# Patient Record
Sex: Female | Born: 1945 | Race: White | Hispanic: No | Marital: Married | Smoking: Former smoker
Health system: Southern US, Community
[De-identification: ages and names within clinical notes are randomized; demographics above are authoritative.]

## PROBLEM LIST (undated history)

## (undated) DIAGNOSIS — E11319 Type 2 diabetes mellitus with unspecified diabetic retinopathy without macular edema: Secondary | ICD-10-CM

## (undated) DIAGNOSIS — H35039 Hypertensive retinopathy, unspecified eye: Secondary | ICD-10-CM

## (undated) DIAGNOSIS — I10 Essential (primary) hypertension: Secondary | ICD-10-CM

## (undated) DIAGNOSIS — R7989 Other specified abnormal findings of blood chemistry: Secondary | ICD-10-CM

## (undated) DIAGNOSIS — E119 Type 2 diabetes mellitus without complications: Secondary | ICD-10-CM

## (undated) DIAGNOSIS — I4891 Unspecified atrial fibrillation: Secondary | ICD-10-CM

## (undated) DIAGNOSIS — H269 Unspecified cataract: Secondary | ICD-10-CM

## (undated) DIAGNOSIS — N289 Disorder of kidney and ureter, unspecified: Secondary | ICD-10-CM

## (undated) DIAGNOSIS — Z7739 Contact with and (suspected) exposure to other war theater: Secondary | ICD-10-CM

## (undated) DIAGNOSIS — E039 Hypothyroidism, unspecified: Secondary | ICD-10-CM

## (undated) DIAGNOSIS — E079 Disorder of thyroid, unspecified: Secondary | ICD-10-CM

## (undated) DIAGNOSIS — Z77098 Contact with and (suspected) exposure to other hazardous, chiefly nonmedicinal, chemicals: Secondary | ICD-10-CM

## (undated) HISTORY — DX: Type 2 diabetes mellitus with unspecified diabetic retinopathy without macular edema: E11.319

## (undated) HISTORY — DX: Unspecified atrial fibrillation: I48.91

## (undated) HISTORY — DX: Type 2 diabetes mellitus without complications: E11.9

## (undated) HISTORY — DX: Other specified abnormal findings of blood chemistry: R79.89

## (undated) HISTORY — DX: Unspecified cataract: H26.9

## (undated) HISTORY — DX: Contact with and (suspected) exposure to other hazardous, chiefly nonmedicinal, chemicals: Z77.098

## (undated) HISTORY — DX: Contact with and (suspected) exposure to other war theater: Z77.39

## (undated) HISTORY — DX: Hypertensive retinopathy, unspecified eye: H35.039

## (undated) HISTORY — PX: TOE AMPUTATION: SHX809

## (undated) HISTORY — PX: HERNIA REPAIR: SHX51

## (undated) HISTORY — PX: APPENDECTOMY: SHX54

## (undated) HISTORY — DX: Essential (primary) hypertension: I10

## (undated) HISTORY — DX: Disorder of kidney and ureter, unspecified: N28.9

## (undated) HISTORY — PX: CHOLECYSTECTOMY: SHX55

---

## 2020-09-29 ENCOUNTER — Ambulatory Visit: Payer: Self-pay | Admitting: Family Medicine

## 2020-10-08 ENCOUNTER — Encounter: Payer: Self-pay | Admitting: Family Medicine

## 2020-10-08 ENCOUNTER — Ambulatory Visit (INDEPENDENT_AMBULATORY_CARE_PROVIDER_SITE_OTHER): Payer: Medicare HMO | Admitting: Family Medicine

## 2020-10-08 ENCOUNTER — Other Ambulatory Visit: Payer: Self-pay

## 2020-10-08 VITALS — BP 161/79 | HR 71 | Temp 97.7°F | Ht 64.5 in | Wt 153.3 lb

## 2020-10-08 DIAGNOSIS — I480 Paroxysmal atrial fibrillation: Secondary | ICD-10-CM

## 2020-10-08 DIAGNOSIS — I739 Peripheral vascular disease, unspecified: Secondary | ICD-10-CM | POA: Diagnosis not present

## 2020-10-08 DIAGNOSIS — Z77098 Contact with and (suspected) exposure to other hazardous, chiefly nonmedicinal, chemicals: Secondary | ICD-10-CM | POA: Diagnosis not present

## 2020-10-08 DIAGNOSIS — R350 Frequency of micturition: Secondary | ICD-10-CM

## 2020-10-08 DIAGNOSIS — Z89429 Acquired absence of other toe(s), unspecified side: Secondary | ICD-10-CM | POA: Diagnosis not present

## 2020-10-08 DIAGNOSIS — E039 Hypothyroidism, unspecified: Secondary | ICD-10-CM | POA: Insufficient documentation

## 2020-10-08 DIAGNOSIS — E1152 Type 2 diabetes mellitus with diabetic peripheral angiopathy with gangrene: Secondary | ICD-10-CM

## 2020-10-08 NOTE — Assessment & Plan Note (Signed)
Related to T2DM and PAD.  Referral made to establish with podiatrist int his area.

## 2020-10-08 NOTE — Patient Instructions (Signed)
Great to meet you today! Have labs completed.  I have ordered a referral to podiatry.  You will receive a call to set up an appointment.  See me again in 3 months.

## 2020-10-08 NOTE — Assessment & Plan Note (Signed)
UA and culture

## 2020-10-08 NOTE — Assessment & Plan Note (Signed)
She avoids radiological procedures unless absolutely necessary due to concern for increased cancer risk related to this.  Her DM, vascular diseas and thyroid disease may be related to this.

## 2020-10-08 NOTE — Assessment & Plan Note (Addendum)
Reports good control with metformin.  Update a1c and renal function today.  Continue lisinopril for renal protection.

## 2020-10-08 NOTE — Assessment & Plan Note (Signed)
S/p angioplasty, unsure which artery.  Outside records requested.  No  Claudication symptoms.  Continue statin and plavix.

## 2020-10-08 NOTE — Assessment & Plan Note (Signed)
Feels well with current dose of armour thyroid.  Update TFT's

## 2020-10-08 NOTE — Assessment & Plan Note (Signed)
Had singular episode after angioplasty for PAD.  She is on metoprolol.  No symptoms.

## 2020-10-08 NOTE — Progress Notes (Signed)
Erin Mitchell - 75 y.o. female MRN [id number]  Date of birth: Feb 07, 1946  Subjective Chief Complaint  Patient presents with  . Establish Care  . mucus in stool    HPI Erin Mitchell is a 75 y.o. female here today for initial visit to establish care.  Recently moved to the area from [place].  She has a history of HTN, T2DM, Hypothyroidism, and PAD.  History of agent orange exposure during [place] War while she was working with the [employer]. Some of her ailments thought to be related to this exposure.    She reports that diabetes has been pretty well controlled.  Currently treated with metformin.  She does have associated PAD as well. She has had angioplasty of artery of leg.  She has had a couple of rays amputated from her R foot.  She denies significant neuropathy.  She is taking plavix daily.  Tolerating crestor for associated HLD as well.   HTN is managed with lisinopril and metoprolol.  She reports that she has done well with these and BP at home has been well controlled.  She did have episode of a. Fib after angioplasty but has not had anything since. Denies side effects related to medications.  She has not had chest pain, shortness of breath, palpitations, headache or vision changes.    Currently on 90mg  of armour thyroid.  Feels good at current dose.   Took synthroid previously, feels better with this.   ROS:  A comprehensive ROS was completed and negative except as noted per HPI    No Known Allergies  Past Medical History:  Diagnosis Date  . Atrial fibrillation (HCC)   . Diabetes (HCC)   . Exposure to   . Hypertension   . Kidney disease   . Low thyroid stimulating hormone (TSH) level     Past Surgical History:  Procedure Laterality Date  . APPENDECTOMY    . CESAREAN SECTION    . CHOLECYSTECTOMY    . HERNIA REPAIR    . TOE AMPUTATION Right    2nd & 3rd    Social History   Socioeconomic History  . Marital status: Married    Spouse name: Not on  file  . Number of children: Not on file  . Years of education: Not on file  . Highest education level: Not on file  Occupational History  . Occupation: Retired  Tobacco Use  . Smoking status: Former Smoker    Packs/day: 0.25    Years: 1.00    Pack years: 0.25    Types: Cigarettes    Quit date: 06/06/1970    Years since quitting: 50.3  . Smokeless tobacco: Never Used  Vaping Use  . Vaping Use: Never used  Substance and Sexual Activity  . Alcohol use: Never  . Drug use: Never  . Sexual activity: Yes    Partners: Male  Other Topics Concern  . Not on file  Social History Narrative  . Not on file   Social Determinants of Health   Financial Resource Strain: Not on file  Food Insecurity: Not on file  Transportation Needs: Not on file  Physical Activity: Not on file  Stress: Not on file  Social Connections: Not on file    Family History  Problem Relation Age of Onset  . Hypertension Father   . Diabetes Brother     Health Maintenance  Topic Date Due  . Hepatitis C Screening  Never done  . PNA vac Low Risk Adult (  2 of 2 - PPSV23) 01/22/2020  . COVID-19 Vaccine (3 - Booster for Pfizer series) 01/28/2020  . INFLUENZA VACCINE  01/04/2021  . TETANUS/TDAP  08/15/2029  . HPV VACCINES  Aged Out  . MAMMOGRAM  Discontinued  . DEXA SCAN  Discontinued  . COLONOSCOPY (Pts 45-2yrs Insurance coverage will need to be confirmed)  Discontinued     ----------------------------------------------------------------------------------------------------------------------------------------------------------------------------------------------------------------- Physical Exam BP (!) 161/79 (BP Location: Left Arm, Patient Position: Sitting, Cuff Size: Normal)   Pulse 71   Temp 97.7 F (36.5 C)   Ht 5' 4.5" (1.638 m)   Wt 153 lb 4.8 oz (69.5 kg)   SpO2 99%   BMI 25.91 kg/m   Physical Exam Constitutional:      Appearance: Normal appearance.  HENT:     Head: Normocephalic and  atraumatic.  Eyes:     General: No scleral icterus. Cardiovascular:     Rate and Rhythm: Normal rate and regular rhythm.  Pulmonary:     Effort: Pulmonary effort is normal.     Breath sounds: Normal breath sounds.  Musculoskeletal:     Cervical back: Neck supple.  Neurological:     General: No focal deficit present.     Mental Status: She is alert.  Psychiatric:        Mood and Affect: Mood normal.        Behavior: Behavior normal.     ------------------------------------------------------------------------------------------------------------------------------------------------------------------------------------------------------------------- Assessment and Plan  Type 2 diabetes mellitus with diabetic peripheral angiopathy and gangrene, without long-term current use of insulin (HCC) Reports good control with metformin.  Update a1c and renal function today.  Continue lisinopril for renal protection.    PAF (paroxysmal atrial fibrillation) (HCC) Had singular episode after angioplasty for PAD.  She is on metoprolol.  No symptoms.   PAD (peripheral artery disease) (HCC) S/p angioplasty, unsure which artery.  Outside records requested.  No  Claudication symptoms.  Continue statin and plavix.   Acquired hypothyroidism Feels well with current dose of armour thyroid.  Update TFT's  History of amputation of toe (HCC) Related to T2DM and PAD.  Referral made to establish with podiatrist int his area.    Urinary frequency UA and culture.   Agent orange exposure She avoids radiological procedures unless absolutely necessary due to concern for increased cancer risk related to this.  Her DM, vascular diseas and thyroid disease may be related to this.     No orders of the defined types were placed in this encounter.   Return in about 3 months (around 01/08/2021) for HTN/T2DM.    This visit occurred during the SARS-CoV-2 public health emergency.  Safety protocols were in place,  including screening questions prior to the visit, additional usage of staff PPE, and extensive cleaning of exam room while observing appropriate contact time as indicated for disinfecting solutions.

## 2020-10-09 ENCOUNTER — Other Ambulatory Visit: Payer: Self-pay | Admitting: Family Medicine

## 2020-10-09 ENCOUNTER — Telehealth: Payer: Self-pay

## 2020-10-09 DIAGNOSIS — E11319 Type 2 diabetes mellitus with unspecified diabetic retinopathy without macular edema: Secondary | ICD-10-CM

## 2020-10-09 LAB — LIPID PANEL W/REFLEX DIRECT LDL
Cholesterol: 126 mg/dL (ref <200)
HDL: 49 mg/dL — ABNORMAL LOW (ref >=50)
LDL Cholesterol (Calc): 56 mg/dL (calc)
Non-HDL Cholesterol (Calc): 77 mg/dL (calc) (ref <130)
Total CHOL/HDL Ratio: 2.6 (calc) (ref <5.0)
Triglycerides: 125 mg/dL (ref <150)

## 2020-10-09 LAB — URINALYSIS, ROUTINE W REFLEX MICROSCOPIC
Bilirubin Urine: NEGATIVE
Glucose, UA: NEGATIVE
Hgb urine dipstick: NEGATIVE
Hyaline Cast: NONE SEEN /LPF
Ketones, ur: NEGATIVE
Nitrite: NEGATIVE
Specific Gravity, Urine: 1.019 (ref 1.001–1.035)
pH: 6.5 (ref 5.0–8.0)

## 2020-10-09 LAB — CBC WITH DIFFERENTIAL/PLATELET
Absolute Monocytes: 893 cells/uL (ref 200–950)
Basophils Absolute: 60 cells/uL (ref 0–200)
Basophils Relative: 0.7 %
Eosinophils Absolute: 434 cells/uL (ref 15–500)
Eosinophils Relative: 5.1 %
HCT: 38.9 % (ref 35.0–45.0)
Hemoglobin: 12.6 g/dL (ref 11.7–15.5)
Lymphs Abs: 2270 cells/uL (ref 850–3900)
MCH: 28.5 pg (ref 27.0–33.0)
MCHC: 32.4 g/dL (ref 32.0–36.0)
MCV: 88 fL (ref 80.0–100.0)
MPV: 10.4 fL (ref 7.5–12.5)
Monocytes Relative: 10.5 %
Neutro Abs: 4845 cells/uL (ref 1500–7800)
Neutrophils Relative %: 57 %
Platelets: 260 10*3/uL (ref 140–400)
RBC: 4.42 10*6/uL (ref 3.80–5.10)
RDW: 12.4 % (ref 11.0–15.0)
Total Lymphocyte: 26.7 %
WBC: 8.5 10*3/uL (ref 3.8–10.8)

## 2020-10-09 LAB — COMPLETE METABOLIC PANEL WITH GFR
AG Ratio: 1.7 (calc) (ref 1.0–2.5)
ALT: 34 U/L — ABNORMAL HIGH (ref 6–29)
AST: 38 U/L — ABNORMAL HIGH (ref 10–35)
Albumin: 4.3 g/dL (ref 3.6–5.1)
Alkaline phosphatase (APISO): 86 U/L (ref 37–153)
BUN: 23 mg/dL (ref 7–25)
CO2: 25 mmol/L (ref 20–32)
Calcium: 9.4 mg/dL (ref 8.6–10.4)
Chloride: 104 mmol/L (ref 98–110)
Creat: 0.8 mg/dL (ref 0.60–0.93)
GFR, Est African American: 85 mL/min/{1.73_m2} (ref >=60)
GFR, Est Non African American: 74 mL/min/{1.73_m2} (ref >=60)
Globulin: 2.5 g/dL (calc) (ref 1.9–3.7)
Glucose, Bld: 131 mg/dL — ABNORMAL HIGH (ref 65–99)
Potassium: 4.4 mmol/L (ref 3.5–5.3)
Sodium: 138 mmol/L (ref 135–146)
Total Bilirubin: 0.4 mg/dL (ref 0.2–1.2)
Total Protein: 6.8 g/dL (ref 6.1–8.1)

## 2020-10-09 LAB — URINE CULTURE
MICRO NUMBER:: 11854412
SPECIMEN QUALITY:: ADEQUATE

## 2020-10-09 LAB — TSH: TSH: 2.65 mIU/L (ref 0.40–4.50)

## 2020-10-09 LAB — HEMOGLOBIN A1C
Hgb A1c MFr Bld: 6.8 % of total Hgb — ABNORMAL HIGH (ref <5.7)
Mean Plasma Glucose: 148 mg/dL
eAG (mmol/L): 8.2 mmol/L

## 2020-10-09 NOTE — Progress Notes (Signed)
r 

## 2020-10-09 NOTE — Telephone Encounter (Signed)
Erin Mitchell has diabetic retinopathy and would like a referral to:   [facility] [address] [place], [place]

## 2020-10-09 NOTE — Telephone Encounter (Signed)
Referral entered.   CM

## 2020-10-12 ENCOUNTER — Other Ambulatory Visit: Payer: Self-pay

## 2020-10-12 DIAGNOSIS — I739 Peripheral vascular disease, unspecified: Secondary | ICD-10-CM

## 2020-10-12 DIAGNOSIS — E1152 Type 2 diabetes mellitus with diabetic peripheral angiopathy with gangrene: Secondary | ICD-10-CM

## 2020-10-12 DIAGNOSIS — I480 Paroxysmal atrial fibrillation: Secondary | ICD-10-CM

## 2020-10-12 MED ORDER — TRUEPLUS LANCETS 33G MISC
3 refills | Status: AC
Start: 2020-10-12 — End: ?

## 2020-10-12 MED ORDER — LISINOPRIL 20 MG PO TABS: ORAL_TABLET | ORAL | 3 refills | Status: DC

## 2020-10-12 MED ORDER — TRUE METRIX METER W/DEVICE KIT: PACK | 0 refills | Status: AC

## 2020-10-12 MED ORDER — METFORMIN HCL 500 MG PO TABS: ORAL_TABLET | ORAL | 3 refills | Status: DC

## 2020-10-12 MED ORDER — ROSUVASTATIN CALCIUM 20 MG PO TABS: ORAL_TABLET | ORAL | 3 refills | Status: DC

## 2020-10-12 MED ORDER — BD SWAB SINGLE USE REGULAR PADS: MEDICATED_PAD | 3 refills | Status: DC

## 2020-10-12 MED ORDER — TRUE METRIX BLOOD GLUCOSE TEST VI STRP: ORAL_STRIP | 12 refills | Status: DC

## 2020-10-12 MED ORDER — CLOPIDOGREL BISULFATE 75 MG PO TABS: ORAL_TABLET | ORAL | 3 refills | Status: DC

## 2020-10-12 MED ORDER — METOPROLOL TARTRATE 25 MG PO TABS: ORAL_TABLET | ORAL | 3 refills | Status: DC

## 2020-10-13 ENCOUNTER — Ambulatory Visit: Payer: Medicare HMO | Admitting: Podiatry

## 2020-10-16 ENCOUNTER — Other Ambulatory Visit: Payer: Self-pay

## 2020-10-16 ENCOUNTER — Encounter: Payer: Self-pay | Admitting: Podiatry

## 2020-10-16 ENCOUNTER — Ambulatory Visit (INDEPENDENT_AMBULATORY_CARE_PROVIDER_SITE_OTHER): Payer: Medicare HMO

## 2020-10-16 ENCOUNTER — Ambulatory Visit: Payer: Medicare HMO | Admitting: Podiatry

## 2020-10-16 DIAGNOSIS — Z89429 Acquired absence of other toe(s), unspecified side: Secondary | ICD-10-CM

## 2020-10-16 DIAGNOSIS — M21611 Bunion of right foot: Secondary | ICD-10-CM

## 2020-10-16 DIAGNOSIS — Z7901 Long term (current) use of anticoagulants: Secondary | ICD-10-CM | POA: Diagnosis not present

## 2020-10-16 DIAGNOSIS — S93141A Subluxation of metatarsophalangeal joint of right great toe, initial encounter: Secondary | ICD-10-CM | POA: Diagnosis not present

## 2020-10-16 DIAGNOSIS — B351 Tinea unguium: Secondary | ICD-10-CM | POA: Diagnosis not present

## 2020-10-16 DIAGNOSIS — E1152 Type 2 diabetes mellitus with diabetic peripheral angiopathy with gangrene: Secondary | ICD-10-CM

## 2020-10-16 DIAGNOSIS — M79674 Pain in right toe(s): Secondary | ICD-10-CM | POA: Diagnosis not present

## 2020-10-16 DIAGNOSIS — I739 Peripheral vascular disease, unspecified: Secondary | ICD-10-CM | POA: Diagnosis not present

## 2020-10-16 DIAGNOSIS — M2011 Hallux valgus (acquired), right foot: Secondary | ICD-10-CM | POA: Diagnosis not present

## 2020-10-16 DIAGNOSIS — M79675 Pain in left toe(s): Secondary | ICD-10-CM

## 2020-10-16 MED ORDER — METFORMIN HCL 500 MG PO TABS: 500.0000 mg | ORAL_TABLET | Freq: Two times a day (BID) | ORAL | 3 refills | Status: DC

## 2020-10-16 NOTE — Progress Notes (Signed)
Subjective:   Patient ID: Erin Mitchell, female   DOB: 75 y.o.   MRN: [id number]   HPI Erin Mitchell is a 75 y.o. female here today for initial visit to establish care. Recently moved to the area from [place].  She has a history of HTN, T2DM, Hypothyroidism, and PAD.  She has history of partial second and third ray amputations as well as vascular procedures given osteomyelitis, infection of the right foot.  She states that she has been stable and she wears compression socks that are open toe.  She is tried diabetic shoes but they were causing irritation states she wears soft sandals which are comfortable not causing any irritation.  Also asking for the nails be trimmed today.  She has not seen any open lesions.  She states that her other doctor told her that the remainder the toes on the right foot will need to be amputated at some point but she is not interested in this.   Per Care Everywhere: 12/23/2019 Amputation of Toe Information not available  12/20/2019 Abdominal Aortogram Notes:  BALLOON ANGIO Information not available  12/16/2019 Amputation of Toe Information not available    Review of Systems  All other systems reviewed and are negative.  Past Medical History:  Diagnosis Date  . Atrial fibrillation ([place])   . Diabetes ([place])   . Exposure to [employer]   . Hypertension   . Kidney disease   . Low thyroid stimulating hormone (TSH) level     Past Surgical History:  Procedure Laterality Date  . APPENDECTOMY    . CESAREAN SECTION    . CHOLECYSTECTOMY    . HERNIA REPAIR    . TOE AMPUTATION Right    2nd & 3rd     Current Outpatient Medications:  .  Alcohol Swabs (B-D SINGLE USE SWABS REGULAR) PADS, Use as directed., Disp: 100 each, Rfl: 3 .  Blood Glucose Monitoring Suppl (TRUE METRIX METER) w/Device KIT, Use as directed to test blood glucose daily, Disp: 1 kit, Rfl: 0 .  clopidogrel (PLAVIX) 75 MG tablet, TAKE 1 TABLET BY MOUTH ONCE DAILY, Disp: 90 tablet, Rfl: 3 .   glucose blood (TRUE METRIX BLOOD GLUCOSE TEST) test strip, Use as instructed, Disp: 100 each, Rfl: 12 .  lisinopril (ZESTRIL) 20 MG tablet, TAKE 1 TABLET BY MOUTH TWICE DAILY, Disp: 90 tablet, Rfl: 3 .  metFORMIN (GLUCOPHAGE) 500 MG tablet, metformin 500 mg tablet, Disp: 90 tablet, Rfl: 3 .  metoprolol tartrate (LOPRESSOR) 25 MG tablet, TAKE 1 TABLET BY MOUTH TWICE DAILY WITH FOOD, Disp: 90 tablet, Rfl: 3 .  rosuvastatin (CRESTOR) 20 MG tablet, TAKE 1 TABLET BY MOUTH ONCE DAILY, Disp: 90 tablet, Rfl: 3 .  thyroid (ARMOUR) 90 MG tablet, NP Thyroid 90 mg tablet  TAKE 1 TABLET BY MOUTH EVERY DAY, Disp: , Rfl:  .  TRUEplus Lancets 33G MISC, Use as directed daily., Disp: 100 each, Rfl: 3  No Known Allergies       Objective:  Physical Exam  General: AAO x3, NAD  Dermatological: Nails are hypertrophic, dystrophic, brittle, discolored, elongated 8. No surrounding redness or drainage. Tenderness nails 1-5 bilaterally except of the second and third toes which been amputated on the right side. No open lesions or pre-ulcerative lesions are identified today.   Vascular: Dorsalis Pedis artery and Posterior Tibial artery pedal pulses are 1/4 bilateral with immedate capillary fill time. There is no pain with calf compression, swelling, warmth, erythema.   Neruologic: Sensation decreased with Thornell Mule  monofilament  Musculoskeletal: Severe bunion is present on the right foot and the fourth and first toe on the right foot are abutting.   Gait: Unassisted, Nonantalgic.       Assessment:   75 year old female with symptomatic onychomycosis, PAD with type 2 diabetes with history of to amputation    Plan:  -Treatment options discussed including all alternatives, risks, and complications -Etiology of symptoms were discussed -Nails debrided 8 without complications or bleeding. -X-rays of the right foot obtained for baseline studies. -Currently without ulcerations.  Continue with compression  socks as well as current shoe gear. -Daily foot inspection -Follow-up in 3 months or sooner if any problems arise. In the meantime, encouraged to call the office with any questions, concerns, change in symptoms.   Celesta Gentile, DPM

## 2020-10-20 ENCOUNTER — Ambulatory Visit: Payer: Medicare HMO | Admitting: Podiatry

## 2020-11-03 ENCOUNTER — Other Ambulatory Visit: Payer: Self-pay

## 2020-11-03 ENCOUNTER — Emergency Department
Admission: EM | Admit: 2020-11-03 | Discharge: 2020-11-03 | Disposition: A | Discharge status: Home / Self Care | Payer: Medicare HMO | Source: Home / Self Care

## 2020-11-03 ENCOUNTER — Inpatient Hospital Stay (HOSPITAL_COMMUNITY): Payer: Medicare HMO

## 2020-11-03 ENCOUNTER — Encounter (HOSPITAL_COMMUNITY): Payer: Self-pay | Admitting: Emergency Medicine

## 2020-11-03 ENCOUNTER — Emergency Department (INDEPENDENT_AMBULATORY_CARE_PROVIDER_SITE_OTHER): Payer: Medicare HMO

## 2020-11-03 ENCOUNTER — Inpatient Hospital Stay (HOSPITAL_COMMUNITY)
Admission: EM | Admit: 2020-11-03 | Discharge: 2020-11-09 | DRG: 394 | Disposition: A | Discharge status: Home / Self Care | Payer: Medicare HMO | Attending: Internal Medicine | Admitting: Internal Medicine

## 2020-11-03 DIAGNOSIS — N3000 Acute cystitis without hematuria: Secondary | ICD-10-CM | POA: Diagnosis not present

## 2020-11-03 DIAGNOSIS — R110 Nausea: Secondary | ICD-10-CM | POA: Diagnosis not present

## 2020-11-03 DIAGNOSIS — Z87891 Personal history of nicotine dependence: Secondary | ICD-10-CM | POA: Diagnosis not present

## 2020-11-03 DIAGNOSIS — I480 Paroxysmal atrial fibrillation: Secondary | ICD-10-CM | POA: Diagnosis not present

## 2020-11-03 DIAGNOSIS — Z7984 Long term (current) use of oral hypoglycemic drugs: Secondary | ICD-10-CM

## 2020-11-03 DIAGNOSIS — Z825 Family history of asthma and other chronic lower respiratory diseases: Secondary | ICD-10-CM

## 2020-11-03 DIAGNOSIS — E1151 Type 2 diabetes mellitus with diabetic peripheral angiopathy without gangrene: Secondary | ICD-10-CM | POA: Diagnosis not present

## 2020-11-03 DIAGNOSIS — Z66 Do not resuscitate: Secondary | ICD-10-CM | POA: Diagnosis not present

## 2020-11-03 DIAGNOSIS — R935 Abnormal findings on diagnostic imaging of other abdominal regions, including retroperitoneum: Secondary | ICD-10-CM | POA: Diagnosis not present

## 2020-11-03 DIAGNOSIS — Z8249 Family history of ischemic heart disease and other diseases of the circulatory system: Secondary | ICD-10-CM | POA: Diagnosis not present

## 2020-11-03 DIAGNOSIS — R188 Other ascites: Secondary | ICD-10-CM | POA: Diagnosis not present

## 2020-11-03 DIAGNOSIS — Z452 Encounter for adjustment and management of vascular access device: Secondary | ICD-10-CM | POA: Diagnosis not present

## 2020-11-03 DIAGNOSIS — Z20822 Contact with and (suspected) exposure to covid-19: Secondary | ICD-10-CM | POA: Diagnosis present

## 2020-11-03 DIAGNOSIS — Z7189 Other specified counseling: Secondary | ICD-10-CM | POA: Diagnosis not present

## 2020-11-03 DIAGNOSIS — K529 Noninfective gastroenteritis and colitis, unspecified: Secondary | ICD-10-CM | POA: Diagnosis present

## 2020-11-03 DIAGNOSIS — R1084 Generalized abdominal pain: Secondary | ICD-10-CM

## 2020-11-03 DIAGNOSIS — K921 Melena: Secondary | ICD-10-CM | POA: Diagnosis not present

## 2020-11-03 DIAGNOSIS — K430 Incisional hernia with obstruction, without gangrene: Secondary | ICD-10-CM | POA: Diagnosis not present

## 2020-11-03 DIAGNOSIS — Z79890 Hormone replacement therapy (postmenopausal): Secondary | ICD-10-CM

## 2020-11-03 DIAGNOSIS — R109 Unspecified abdominal pain: Secondary | ICD-10-CM | POA: Diagnosis present

## 2020-11-03 DIAGNOSIS — E1129 Type 2 diabetes mellitus with other diabetic kidney complication: Secondary | ICD-10-CM | POA: Diagnosis present

## 2020-11-03 DIAGNOSIS — Z7902 Long term (current) use of antithrombotics/antiplatelets: Secondary | ICD-10-CM

## 2020-11-03 DIAGNOSIS — K5730 Diverticulosis of large intestine without perforation or abscess without bleeding: Secondary | ICD-10-CM | POA: Diagnosis present

## 2020-11-03 DIAGNOSIS — E860 Dehydration: Secondary | ICD-10-CM | POA: Diagnosis present

## 2020-11-03 DIAGNOSIS — I10 Essential (primary) hypertension: Secondary | ICD-10-CM | POA: Diagnosis not present

## 2020-11-03 DIAGNOSIS — I493 Ventricular premature depolarization: Secondary | ICD-10-CM | POA: Diagnosis present

## 2020-11-03 DIAGNOSIS — E876 Hypokalemia: Secondary | ICD-10-CM | POA: Diagnosis not present

## 2020-11-03 DIAGNOSIS — I728 Aneurysm of other specified arteries: Secondary | ICD-10-CM | POA: Diagnosis not present

## 2020-11-03 DIAGNOSIS — R197 Diarrhea, unspecified: Secondary | ICD-10-CM | POA: Diagnosis not present

## 2020-11-03 DIAGNOSIS — K460 Unspecified abdominal hernia with obstruction, without gangrene: Secondary | ICD-10-CM

## 2020-11-03 DIAGNOSIS — Z01810 Encounter for preprocedural cardiovascular examination: Secondary | ICD-10-CM | POA: Diagnosis not present

## 2020-11-03 DIAGNOSIS — E1152 Type 2 diabetes mellitus with diabetic peripheral angiopathy with gangrene: Secondary | ICD-10-CM

## 2020-11-03 DIAGNOSIS — E039 Hypothyroidism, unspecified: Secondary | ICD-10-CM | POA: Diagnosis not present

## 2020-11-03 DIAGNOSIS — K420 Umbilical hernia with obstruction, without gangrene: Secondary | ICD-10-CM

## 2020-11-03 DIAGNOSIS — E872 Acidosis: Secondary | ICD-10-CM | POA: Diagnosis present

## 2020-11-03 DIAGNOSIS — R1031 Right lower quadrant pain: Secondary | ICD-10-CM | POA: Diagnosis not present

## 2020-11-03 DIAGNOSIS — N838 Other noninflammatory disorders of ovary, fallopian tube and broad ligament: Secondary | ICD-10-CM | POA: Diagnosis not present

## 2020-11-03 DIAGNOSIS — I4891 Unspecified atrial fibrillation: Secondary | ICD-10-CM | POA: Diagnosis not present

## 2020-11-03 DIAGNOSIS — D72829 Elevated white blood cell count, unspecified: Secondary | ICD-10-CM | POA: Diagnosis not present

## 2020-11-03 DIAGNOSIS — N179 Acute kidney failure, unspecified: Secondary | ICD-10-CM | POA: Diagnosis present

## 2020-11-03 DIAGNOSIS — R634 Abnormal weight loss: Secondary | ICD-10-CM | POA: Diagnosis not present

## 2020-11-03 DIAGNOSIS — K436 Other and unspecified ventral hernia with obstruction, without gangrene: Secondary | ICD-10-CM | POA: Diagnosis not present

## 2020-11-03 DIAGNOSIS — E119 Type 2 diabetes mellitus without complications: Secondary | ICD-10-CM

## 2020-11-03 DIAGNOSIS — Z833 Family history of diabetes mellitus: Secondary | ICD-10-CM

## 2020-11-03 DIAGNOSIS — K56609 Unspecified intestinal obstruction, unspecified as to partial versus complete obstruction: Secondary | ICD-10-CM | POA: Diagnosis not present

## 2020-11-03 DIAGNOSIS — K6389 Other specified diseases of intestine: Secondary | ICD-10-CM | POA: Diagnosis not present

## 2020-11-03 DIAGNOSIS — Z79899 Other long term (current) drug therapy: Secondary | ICD-10-CM

## 2020-11-03 DIAGNOSIS — Z4682 Encounter for fitting and adjustment of non-vascular catheter: Secondary | ICD-10-CM | POA: Diagnosis not present

## 2020-11-03 HISTORY — DX: Disorder of thyroid, unspecified: E07.9

## 2020-11-03 LAB — CBC WITH DIFFERENTIAL/PLATELET
Abs Immature Granulocytes: 0.1 10*3/uL — ABNORMAL HIGH (ref 0.00–0.07)
Basophils Absolute: 0.1 10*3/uL (ref 0.0–0.1)
Basophils Relative: 0 %
Eosinophils Absolute: 0.4 10*3/uL (ref 0.0–0.5)
Eosinophils Relative: 3 %
HCT: 40.4 % (ref 36.0–46.0)
Hemoglobin: 13.4 g/dL (ref 12.0–15.0)
Immature Granulocytes: 1 %
Lymphocytes Relative: 18 %
Lymphs Abs: 2.5 10*3/uL (ref 0.7–4.0)
MCH: 29 pg (ref 26.0–34.0)
MCHC: 33.2 g/dL (ref 30.0–36.0)
MCV: 87.4 fL (ref 80.0–100.0)
Monocytes Absolute: 1.1 10*3/uL — ABNORMAL HIGH (ref 0.1–1.0)
Monocytes Relative: 8 %
Neutro Abs: 9.6 10*3/uL — ABNORMAL HIGH (ref 1.7–7.7)
Neutrophils Relative %: 70 %
Platelets: 321 10*3/uL (ref 150–400)
RBC: 4.62 MIL/uL (ref 3.87–5.11)
RDW: 13 % (ref 11.5–15.5)
WBC: 13.7 10*3/uL — ABNORMAL HIGH (ref 4.0–10.5)
nRBC: 0 % (ref 0.0–0.2)

## 2020-11-03 LAB — COMPREHENSIVE METABOLIC PANEL
ALT: 20 U/L (ref 0–44)
AST: 25 U/L (ref 15–41)
Albumin: 4 g/dL (ref 3.5–5.0)
Alkaline Phosphatase: 75 U/L (ref 38–126)
Anion gap: 14 (ref 5–15)
BUN: 29 mg/dL — ABNORMAL HIGH (ref 8–23)
CO2: 19 mmol/L — ABNORMAL LOW (ref 22–32)
Calcium: 9.3 mg/dL (ref 8.9–10.3)
Chloride: 102 mmol/L (ref 98–111)
Creatinine, Ser: 1.25 mg/dL — ABNORMAL HIGH (ref 0.44–1.00)
GFR, Estimated: 46 mL/min — ABNORMAL LOW (ref >60)
Glucose, Bld: 91 mg/dL (ref 70–99)
Potassium: 4.4 mmol/L (ref 3.5–5.1)
Sodium: 135 mmol/L (ref 135–145)
Total Bilirubin: 0.8 mg/dL (ref 0.3–1.2)
Total Protein: 7.3 g/dL (ref 6.5–8.1)

## 2020-11-03 LAB — RESP PANEL BY RT-PCR (FLU A&B, COVID) ARPGX2
Influenza A by PCR: NEGATIVE
Influenza B by PCR: NEGATIVE
SARS Coronavirus 2 by RT PCR: NEGATIVE

## 2020-11-03 LAB — LIPASE, BLOOD: Lipase: 32 U/L (ref 11–51)

## 2020-11-03 LAB — TYPE AND SCREEN
ABO/RH(D): A POS
Antibody Screen: NEGATIVE

## 2020-11-03 MED ORDER — NALOXONE HCL 0.4 MG/ML IJ SOLN: 0.4000 mg | INTRAMUSCULAR | Status: DC | PRN

## 2020-11-03 MED ORDER — ACETAMINOPHEN 325 MG PO TABS
650.0000 mg | ORAL_TABLET | Freq: Four times a day (QID) | ORAL | Status: DC | PRN
  Administered 2020-11-06: 650 mg via ORAL
  Filled 2020-11-03: qty 2

## 2020-11-03 MED ORDER — INSULIN ASPART 100 UNIT/ML IJ SOLN
0.0000 [IU] | Freq: Four times a day (QID) | INTRAMUSCULAR | Status: DC
  Administered 2020-11-04 – 2020-11-08 (×5): 1 [IU] via SUBCUTANEOUS
  Administered 2020-11-08: 2 [IU] via SUBCUTANEOUS

## 2020-11-03 MED ORDER — LACTATED RINGERS IV SOLN: INTRAVENOUS | Status: DC

## 2020-11-03 MED ORDER — FENTANYL CITRATE (PF) 100 MCG/2ML IJ SOLN
50.0000 ug | INTRAMUSCULAR | Status: DC | PRN
  Administered 2020-11-03: 50 ug via INTRAVENOUS
  Filled 2020-11-03: qty 2

## 2020-11-03 MED ORDER — ONDANSETRON HCL 4 MG/2ML IJ SOLN
4.0000 mg | Freq: Four times a day (QID) | INTRAMUSCULAR | Status: DC | PRN
  Administered 2020-11-03: 4 mg via INTRAVENOUS
  Filled 2020-11-03: qty 2

## 2020-11-03 MED ORDER — ACETAMINOPHEN 650 MG RE SUPP: 650.0000 mg | Freq: Four times a day (QID) | RECTAL | Status: DC | PRN

## 2020-11-03 NOTE — ED Triage Notes (Signed)
Pt sent from UC for further evaluation of a bowel obstruction. Pt c/o lower abdominal pain and diarrhea x 1 week.

## 2020-11-03 NOTE — Discharge Instructions (Signed)
Go to [facility]  [address] to be seen by surgery and for admission

## 2020-11-03 NOTE — ED Provider Notes (Signed)
Erin Mitchell CARE    CSN: [id number] Arrival date & time: 11/03/20  0945      History   Chief Complaint Chief Complaint  Patient presents with  . Diarrhea    HPI Erin Mitchell is a 75 y.o. female.   The history is provided by the patient. No language interpreter was used.  Diarrhea Quality:  Explosive Severity:  Severe Onset quality:  Sudden Number of episodes:  Multiple Duration:  7 days Progression:  Worsening Relieved by:  Nothing Worsened by:  Nothing Risk factors: no recent antibiotic use and no suspicious food intake   Pt reports she ate puruvian food 1 week ago.  Pt reports she began having diarrhea afterwards.  Pt reports continued diarrhea and now abdominal pain.  Pt reports she was told that she had diverticulitis in the past.    Past Medical History:  Diagnosis Date  . Atrial fibrillation ([place])   . Diabetes ([place])   . Exposure to [employer]   . Hypertension   . Kidney disease   . Low thyroid stimulating hormone (TSH) level   . Thyroid disease     Patient Active Problem List   Diagnosis Date Noted  . Type 2 diabetes mellitus with diabetic peripheral angiopathy and gangrene, without long-term current use of insulin ([place]) 10/08/2020  . PAF (paroxysmal atrial fibrillation) ([place]) 10/08/2020  . Acquired hypothyroidism 10/08/2020  . PAD (peripheral artery disease) ([place]) 10/08/2020  . Urinary frequency 10/08/2020  . History of amputation of toe ([place]) 10/08/2020  . Agent orange exposure 10/08/2020    Past Surgical History:  Procedure Laterality Date  . APPENDECTOMY    . CESAREAN SECTION    . CHOLECYSTECTOMY    . HERNIA REPAIR    . TOE AMPUTATION Right    2nd & 3rd    OB History   No obstetric history on file.      Home Medications    Prior to Admission medications   Medication Sig Start Date End Date Taking? Authorizing Provider  Loperamide HCl (IMODIUM PO) Take 1 tablet by mouth 2 (two) times daily as needed.   Yes [provider]  Alcohol Swabs (B-D SINGLE USE SWABS REGULAR) PADS Use as directed. 10/12/20   Luetta Nutting, DO  Blood Glucose Monitoring Suppl (TRUE METRIX METER) w/Device KIT Use as directed to test blood glucose daily 10/12/20   Luetta Nutting, DO  clopidogrel (PLAVIX) 75 MG tablet TAKE 1 TABLET BY MOUTH ONCE DAILY 10/12/20   Luetta Nutting, DO  glucose blood (TRUE METRIX BLOOD GLUCOSE TEST) test strip Use as instructed 10/12/20   Luetta Nutting, DO  lisinopril (ZESTRIL) 20 MG tablet TAKE 1 TABLET BY MOUTH TWICE DAILY 10/12/20   Luetta Nutting, DO  metFORMIN (GLUCOPHAGE) 500 MG tablet Take 1 tablet (500 mg total) by mouth 2 (two) times daily with a meal. 10/16/20 04/14/21  Luetta Nutting, DO  metoprolol tartrate (LOPRESSOR) 25 MG tablet TAKE 1 TABLET BY MOUTH TWICE DAILY WITH FOOD 10/12/20   Luetta Nutting, DO  rosuvastatin (CRESTOR) 20 MG tablet TAKE 1 TABLET BY MOUTH ONCE DAILY 10/12/20   Luetta Nutting, DO  thyroid (ARMOUR) 90 MG tablet NP Thyroid 90 mg tablet  TAKE 1 TABLET BY MOUTH EVERY DAY    [provider]  TRUEplus Lancets 33G MISC Use as directed daily. 10/12/20   Luetta Nutting, DO    Family History Family History  Problem Relation Age of Onset  . Hypertension Father   . Diabetes Brother   .  Emphysema Mother     Social History Social History   Tobacco Use  . Smoking status: Former Smoker    Packs/day: 0.25    Years: 1.00    Pack years: 0.25    Types: Cigarettes    Quit date: 06/06/1970    Years since quitting: 50.4  . Smokeless tobacco: Never Used  Vaping Use  . Vaping Use: Never used  Substance Use Topics  . Alcohol use: Never  . Drug use: Never     Allergies   Patient has no known allergies.   Review of Systems Review of Systems  Gastrointestinal: Positive for diarrhea.  All other systems reviewed and are negative.    Physical Exam Triage Vital Signs ED Triage Vitals  Enc Vitals Group     BP 11/03/20 1054 120/77     Pulse Rate 11/03/20 1054 77      Resp 11/03/20 1054 18     Temp 11/03/20 1054 (!) 97.5 F (36.4 C)     Temp Source 11/03/20 1054 Oral     SpO2 11/03/20 1054 99 %     Weight 11/03/20 1050 148 lb (67.1 kg)     Height 11/03/20 1050 $RemoveBefor'5\' 4"'DUyMnHJSBcfi$  (1.626 m)     Head Circumference --      [place] Flow --      Pain Score 11/03/20 1049 0     Pain Loc --      Pain Edu? --      Excl. in [place]? --    No data found.  Updated Vital Signs BP 120/77 (BP Location: Right Arm)   Pulse 77   Temp (!) 97.5 F (36.4 C) (Oral)   Resp 18   Ht $R'5\' 4"'rE$  (1.626 m)   Wt 67.1 kg   SpO2 99%   BMI 25.40 kg/m   Visual Acuity Right Eye Distance:   Left Eye Distance:   Bilateral Distance:    Right Eye Near:   Left Eye Near:    Bilateral Near:     Physical Exam Vitals and nursing note reviewed.  Constitutional:      Appearance: She is well-developed.  HENT:     Head: Normocephalic.  Cardiovascular:     Rate and Rhythm: Normal rate and regular rhythm.  Pulmonary:     Effort: Pulmonary effort is normal.  Abdominal:     General: There is no distension.     Tenderness: There is abdominal tenderness.     Hernia: A hernia is present.     Comments: Tender left lower abdomen, hernia and scarring at umbilicus.  Abdomen feels firm    Musculoskeletal:        General: Normal range of motion.     Cervical back: Normal range of motion.  Skin:    General: Skin is warm.  Neurological:     Mental Status: She is alert and oriented to person, place, and time.      UC Treatments / Results  Labs (all labs ordered are listed, but only abnormal results are displayed) Labs Reviewed  CBC WITH DIFFERENTIAL/PLATELET  COMPREHENSIVE METABOLIC PANEL  GASTROINTESTINAL PATHOGEN PANEL PCR    EKG   Radiology CT ABDOMEN PELVIS WO CONTRAST  Result Date: 11/03/2020 CLINICAL DATA:  Left upper quadrant abdominal pain and diarrhea for 9 days. Episode of bloody stools. Weight loss of 8-9 pounds. Prior appendectomy and cholecystectomy. History of diverticulitis.  EXAM: CT ABDOMEN AND PELVIS WITHOUT CONTRAST TECHNIQUE: Multidetector CT imaging of the abdomen and pelvis was performed  following the standard protocol without IV contrast. COMPARISON:  None. FINDINGS: Lower chest: No significant pulmonary nodules or acute consolidative airspace disease. Hepatobiliary: Normal liver size. No liver mass. Cholecystectomy. No biliary ductal dilatation. Pancreas: Normal, with no mass or duct dilation. Spleen: Normal size. No mass. Adrenals/Urinary Tract: Normal adrenals. No renal stones. No hydronephrosis. No contour deforming renal masses. Normal bladder. Stomach/Bowel: Normal non-distended stomach. Moderate periumbilical ventral abdominal hernia containing an incarcerated distal small bowel loop, proximal to which the small bowel is diffusely mildly dilated and fluid-filled (up to 3.1 cm diameter), and distal to which the small bowel is relatively collapsed, compatible with mechanical distal small bowel obstruction. No thick walled small bowel loops. No pneumatosis. Appendectomy. Moderate diffuse colonic diverticulosis with no large bowel wall thickening or significant pericolonic fat stranding. Vascular/Lymphatic: Atherosclerotic nonaneurysmal abdominal aorta. 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 No pathologically enlarged lymph nodes in the abdomen or pelvis. Reproductive: Fluid and gas mildly distends the uterine cavity. Simple 3.1 cm right adnexal cyst (series 2/image 64). No left adnexal mass. Other: No pneumoperitoneum, ascites or focal fluid collection. Musculoskeletal: No aggressive appearing focal osseous lesions. Mild thoracolumbar spondylosis. IMPRESSION: 1. Mechanical distal small bowel obstruction due to incarcerated distal small bowel loop within a moderate size periumbilical ventral abdominal hernia. No free air. No abscess. No pneumatosis. Surgical  consultation advised. 2. Moderate diffuse colonic diverticulosis with no evidence of acute diverticulitis. 3. Fluid and gas mildly distends the uterine cavity, nonspecific. Recommend correlation with pelvic ultrasound on a short term outpatient basis. 4. Simple 3.1 cm right adnexal cyst. Recommend follow-up US in 6-12 months. Note: This recommendation does not apply to premenarchal patients and to those with increased risk (genetic, family history, elevated tumor markers or other high-risk factors) of ovarian cancer. Reference: JACR 2020 Feb; 17(2):248-254 5. Aortic Atherosclerosis (ICD10-I70.0). Electronically Signed   By: Ilona Sorrel M.D.   On: 11/03/2020 13:46    Procedures Procedures (including critical care time)  Medications Ordered in UC Medications - No data to display  Initial Impression / Assessment and Plan / UC Course  I have reviewed the triage vital signs and the nursing notes.  Pertinent labs & imaging results that were available during my care of the patient were reviewed by me and considered in my medical decision making (see chart for details).     Ct with oral contrast shows hernia with incarcerated bowel and distal small bowel obstruction  I spoke with Dr. Harlow Asa  General surgeon.  Pt advised to go to [facility] hospital in [place] ( address given to pt and husband)  Dr. Reather Converse EDP advised  Final Clinical Impressions(s) / UC Diagnoses   Final diagnoses:  Diarrhea, unspecified type  Generalized abdominal pain  Intestinal obstruction, unspecified cause, unspecified whether partial or complete ([place])  Umbilical hernia, incarcerated     Discharge Instructions     Go to [facility]  [address] to be seen by surgery and for admission    ED Prescriptions    None     PDMP not reviewed this encounter.   Fransico Meadow, [place] 11/03/20 1452

## 2020-11-03 NOTE — ED Triage Notes (Signed)
Pt presents to Urgent Care with c/o diarrhea x 9 days. Reports one incident of having blood-tinged mucus in stool approx one week ago. Imodium has not relieved.

## 2020-11-03 NOTE — H&P (Signed)
History and Physical    PLEASE NOTE THAT DRAGON DICTATION SOFTWARE WAS USED IN THE CONSTRUCTION OF THIS NOTE.   Erin Mitchell EFE:[id number] DOB: 13-Jun-1945 DOA: 11/03/2020  PCP: Luetta Nutting, DO Patient coming from: home   I have personally briefly reviewed patient's old medical records in [place]  Chief Complaint: Abdominal pain  HPI: Erin Mitchell is a 75 y.o. female with medical history significant for chronic ventral abdominal hernia, type 2 diabetes mellitus, acquired hypothyroidism, essential hypertension, paroxysmal atrial fibrillation not on chronic formal anticoagulation, who is admitted to [facility] on 11/03/2020 with small bowel obstruction in the setting of incarcerated ventral abdominal hernia after presenting from home to [facility] ED complaining of abdominal pain.   The patient reports 7 to 10 days of progressive generalized abdominal discomfort, which she describes as sharp in nature.  She reports that initially this discomfort was intermittent, but is increased in frequency/intensity over the last few days to the point where is now constant.  She notes exacerbation of this discomfort with palpation over the abdomen.  She reports associated nausea in the absence of any vomiting, and notes diminished oral intake over the last several days in the setting of her abdominal pain as well as intermittent nausea.  She notes development of loose stool over the last 4 to 5 days, with progressive decline in the associated volume of stool output over that timeframe, with most recent small loose stool bowel movement occurring just prior to presenting to the emergency department today.  Denies any associated melena or hematochezia.  She also notes recent decline in flatus production.  No recent trauma or travel.  Denies any recent dysuria, gross hematuria, or change in urinary urgency/frequency.  Denies any associated subjective fever, chills, rigors, or generalized  myalgias.  She notes an approximate 40-year history of chronic ventral abdominal wall hernia, for which she underwent unsuccessful surgical intervention around 1997.  She reports that her ventral hernia has always been reducible, reproducing easily when laying supine, and also being on the reducible.  However, over the course of the last week, she reports progressive difficulty in reducing her ventral hernia, noting that she is no longer able to reduce it over the last few days.  Denies any prior history of small bowel obstruction.  She notes that her abdominal surgical history, aside from the previously described failed ventral hernia repair 1997, includes appendectomy, cholecystectomy, and C-section, although I did not specifically asked the patient if these occurred via laparotomy or laparoscopy.  In the setting of 7 to 10 days of progressive abdominal pain, diminished stool output, and interval inability to reduce her ventral hernia, the patient presented to local urgent care earlier today, where CT abdomen/pelvis demonstrated mechanical distal small bowel obstruction due to incarcerated distal small bowel loop within a moderate sized periumbilical ventral abdominal hernia without evidence of free air or abscess.  This imaging also showed diverticulosis without evidence of diverticulitis, and otherwise demonstrated no evidence of acute intra-abdominal process.  In the setting of these imaging results, she was instructed by urgent care to present to the emergency department for further evaluation and management of these findings.  She denies any recent headache, neck stiffness, rhinitis, rhinorrhea, sore throat, wheezing, cough, rash.  No known recent COVID-19 exposures.  She denies any recent chest pain, shortness of breath, diaphoresis, palpitations, dizziness, presyncope, or syncope.  She also denies any recent orthopnea, PND, or peripheral edema.  Denies any known history of underlying coronary artery  disease or congestive heart failure.  Medical history notable for paroxysmal atrial fibrillation for which she is not formally anticoagulated.  Rather, she is on Plavix 75 mg p.o. daily, with most recent dose occurring this morning (11/03/20).  She denies any concomitant use of aspirin.     ED Course:  Vital signs in the ED were notable for the following: Tetramex 97.5, heart rate 66-87, blood pressure 117/71 145/81; respiratory 15-20, oxygen saturation 98 100% on room air.-  Labs were notable for the following: CMP he notable for the following: Sodium 135, potassium 4.2, bicarbonate 19, anion gap 14, creatinine 1.25 relative to most recent prior value 0.80 on 10/08/2020, and liver enzymes were found to be within normal limits.  Lipase 32.  CBC notable for white blood cell count of 13,700, hemoglobin 13 platelets 321.  Urinalysis showed a cloudy specimen with greater than 50 white blood cells, many bacteria, large leukocyte Estrace, no squamous epithelial cells, greater than 300 protein and was positive for hyaline cast.  Nasopharyngeal COVID-19/influenza PCR were performed in the emergency department today and found to be negative.  EKG showed ventricular bigeminy with ventricular rate 88, no evidence of T wave or ST changes.  The patient's case was discussed with the on-call general surgeon, Dr. Bobbye Morton Who will formally consult, and request that manual reduction not be attempted.  Dr.Lovick  recommends admission to the hospitalist service for further evaluation management of his multiple obstruction in the setting of incarcerated ventral abdominal wall hernia, and plans to evaluate the patient in person, with suspected ensuing surgical intervention.  Additionally, Dr. Bolivar Haw started requested placement of NG tube, which has subsequently been placed and attached to low, intermittent wall suction.  While in the ED, the following were administered: Zofran 4 mg IV x1.     Review of Systems: As per  HPI otherwise 10 point review of systems negative.   Past Medical History:  Diagnosis Date  . Atrial fibrillation ([place])   . Diabetes ([place])   . Exposure to [employer]   . Hypertension   . Kidney disease   . Low thyroid stimulating hormone (TSH) level   . Thyroid disease     Past Surgical History:  Procedure Laterality Date  . APPENDECTOMY    . CESAREAN SECTION    . CHOLECYSTECTOMY    . HERNIA REPAIR    . 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 Her smoking use included cigarettes. She has a 0.25 pack-year smoking history. She has never used smokeless tobacco. She reports that she does not drink alcohol and does not use drugs.   No Known Allergies  Family History  Problem Relation Age of Onset  . Hypertension Father   . Diabetes Brother   . Emphysema Mother     Family history reviewed and not pertinent    Prior to Admission medications   Medication Sig Start Date End Date Taking? Authorizing Provider  clopidogrel (PLAVIX) 75 MG tablet TAKE 1 TABLET BY MOUTH ONCE DAILY Patient taking differently: Take 75 mg by mouth in the morning. 10/12/20  Yes Luetta Nutting, DO  lisinopril (ZESTRIL) 20 MG tablet TAKE 1 TABLET BY MOUTH TWICE DAILY Patient taking differently: Take 20 mg by mouth in the morning and at bedtime. 10/12/20  Yes Luetta Nutting, DO  loperamide (IMODIUM A-D) 2 MG tablet Take 2 mg by mouth 4 (four) times daily as needed for  diarrhea or loose stools.   Yes [provider]  metFORMIN (GLUCOPHAGE) 500 MG tablet Take 1 tablet (500 mg total) by mouth 2 (two) times daily with a meal. 10/16/20 04/14/21 Yes Luetta Nutting, DO  metoprolol tartrate (LOPRESSOR) 25 MG tablet TAKE 1 TABLET BY MOUTH TWICE DAILY WITH FOOD Patient taking differently: Take 25 mg by mouth 2 (two) times daily with a meal. 10/12/20  Yes Luetta Nutting, DO  rosuvastatin (CRESTOR) 20 MG tablet TAKE 1 TABLET BY MOUTH ONCE DAILY Patient  taking differently: Take 20 mg by mouth at bedtime. 10/12/20  Yes Luetta Nutting, DO  thyroid (ARMOUR) 90 MG tablet Take 90 mg by mouth daily before breakfast.   Yes [provider]  Alcohol Swabs (B-D SINGLE USE SWABS REGULAR) PADS Use as directed. 10/12/20   Luetta Nutting, DO  Blood Glucose Monitoring Suppl (TRUE METRIX METER) w/Device KIT Use as directed to test blood glucose daily 10/12/20   Luetta Nutting, DO  glucose blood (TRUE METRIX BLOOD GLUCOSE TEST) test strip Use as instructed 10/12/20   Luetta Nutting, DO  Loperamide HCl (IMODIUM PO) Take 1 tablet by mouth 2 (two) times daily as needed. Patient not taking: Reported on 11/03/2020    [provider]  TRUEplus Lancets 33G MISC Use as directed daily. 10/12/20   Luetta Nutting, DO     Objective    Physical Exam: Vitals:   11/03/20 1900 11/03/20 1915 11/03/20 1945 11/03/20 2000  BP: (!) 160/82 (!) 145/81  113/84  Pulse: 94 78 77 76  Resp: _0 Temp:      TempSrc:      SpO2: 98% 100% 100% 100%    General: appears to be stated age; alert, oriented Skin: warm, dry, no rash Head:  AT/[place] Mouth:  Oral mucosa membranes appear dry, normal dentition Neck: supple; trachea midline Heart:  RRR; did not appreciate any M/R/G Lungs: CTAB, did not appreciate any wheezes, rales, or rhonchi Abdomen: + BS; soft, ND, ventral hernia noted; mild generalized tenderness in the absence of associated guarding, rigidity, or rebound tenderness Vascular: 2+ pedal pulses b/l; 2+ radial pulses b/l Extremities: no peripheral edema, no muscle wasting Neuro: strength and sensation intact in upper and lower extremities b/l   Labs on Admission: I have personally reviewed following labs and imaging studies  CBC: Recent Labs  Lab 11/03/20 1820  WBC 13.7*  NEUTROABS 9.6*  HGB 13.4  HCT 40.4  MCV 87.4  PLT 237   Basic Metabolic Panel: Recent Labs  Lab 11/03/20 1820  NA 135  K 4.4  CL 102  CO2 19*  GLUCOSE 91  BUN 29*   CREATININE 1.25*  CALCIUM 9.3   GFR: Estimated Creatinine Clearance: 38.3 mL/min (A) (by C-G formula based on SCr of 1.25 mg/dL (H)). Liver Function Tests: Recent Labs  Lab 11/03/20 1820  AST 25  ALT 20  ALKPHOS 75  BILITOT 0.8  PROT 7.3  ALBUMIN 4.0   Recent Labs  Lab 11/03/20 1820  LIPASE 32   No results for input(s): AMMONIA in the last 168 hours. Coagulation Profile: No results for input(s): INR, PROTIME in the last 168 hours. Cardiac Enzymes: No results for input(s): CKTOTAL, CKMB, CKMBINDEX, TROPONINI in the last 168 hours. BNP (last 3 results) No results for input(s): PROBNP in the last 8760 hours. HbA1C: No results for input(s): HGBA1C in the last 72 hours. CBG: No results for input(s): GLUCAP in the last 168 hours. Lipid Profile: No results for input(s):  CHOL, HDL, LDLCALC, TRIG, CHOLHDL, LDLDIRECT in the last 72 hours. Thyroid Function Tests: No results for input(s): TSH, T4TOTAL, FREET4, T3FREE, THYROIDAB in the last 72 hours. Anemia Panel: No results for input(s): VITAMINB12, FOLATE, FERRITIN, TIBC, IRON, RETICCTPCT in the last 72 hours. Urine analysis:    Component Value Date/Time   COLORURINE DARK YELLOW 10/08/2020 0000   APPEARANCEUR CLEAR 10/08/2020 0000   LABSPEC 1.019 10/08/2020 0000   PHURINE 6.5 10/08/2020 0000   GLUCOSEU NEGATIVE 10/08/2020 0000   HGBUR NEGATIVE 10/08/2020 0000   KETONESUR NEGATIVE 10/08/2020 0000   PROTEINUR 3+ (A) 10/08/2020 0000   NITRITE NEGATIVE 10/08/2020 0000   LEUKOCYTESUR 1+ (A) 10/08/2020 0000    Radiological Exams on Admission: CT ABDOMEN PELVIS WO CONTRAST  Result Date: 11/03/2020 CLINICAL DATA:  Left upper quadrant abdominal pain and diarrhea for 9 days. Episode of bloody stools. Weight loss of 8-9 pounds. Prior appendectomy and cholecystectomy. History of diverticulitis. EXAM: CT ABDOMEN AND PELVIS WITHOUT CONTRAST TECHNIQUE: Multidetector CT imaging of the abdomen and pelvis was performed following the  standard protocol without IV contrast. COMPARISON:  None. FINDINGS: Lower chest: No significant pulmonary nodules or acute consolidative airspace disease. Hepatobiliary: Normal liver size. No liver mass. Cholecystectomy. No biliary ductal dilatation. Pancreas: Normal, with no mass or duct dilation. Spleen: Normal size. No mass. Adrenals/Urinary Tract: Normal adrenals. No renal stones. No hydronephrosis. No contour deforming renal masses. Normal bladder. Stomach/Bowel: Normal non-distended stomach. Moderate periumbilical ventral abdominal hernia containing an incarcerated distal small bowel loop, proximal to which the small bowel is diffusely mildly dilated and fluid-filled (up to 3.1 cm diameter), and distal to which the small bowel is relatively collapsed, compatible with mechanical distal small bowel obstruction. No thick walled small bowel loops. No pneumatosis. Appendectomy. Moderate diffuse colonic diverticulosis with no large bowel wall thickening or significant pericolonic fat stranding. Vascular/Lymphatic: Atherosclerotic nonaneurysmal abdominal aorta. 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 No pathologically enlarged lymph nodes in the abdomen or pelvis. Reproductive: Fluid and gas mildly distends the uterine cavity. Simple 3.1 cm right adnexal cyst (series 2/image 64). No left adnexal mass. Other: No pneumoperitoneum, ascites or focal fluid collection. Musculoskeletal: No aggressive appearing focal osseous lesions. Mild thoracolumbar spondylosis. IMPRESSION: 1. Mechanical distal small bowel obstruction due to incarcerated distal small bowel loop within a moderate size periumbilical ventral abdominal hernia. No free air. No abscess. No pneumatosis. Surgical consultation advised. 2. Moderate diffuse colonic diverticulosis with no evidence of acute diverticulitis. 3. Fluid and gas mildly distends the  uterine cavity, nonspecific. Recommend correlation with pelvic ultrasound on a short term outpatient basis. 4. Simple 3.1 cm right adnexal cyst. Recommend follow-up US in 6-12 months. Note: This recommendation does not apply to premenarchal patients and to those with increased risk (genetic, family history, elevated tumor markers or other high-risk factors) of ovarian cancer. Reference: JACR 2020 Feb; 17(2):248-254 5. Aortic Atherosclerosis (ICD10-I70.0). Electronically Signed   By: Erin Mitchell M.D.   On: 11/03/2020 13:46     EKG: Independently reviewed, with result as described above.    Assessment/Plan   Erin Mitchell is a 76 y.o. female with medical history significant for chronic ventral abdominal hernia, type 2 diabetes mellitus, acquired hypothyroidism, essential hypertension, paroxysmal atrial fibrillation not on chronic formal anticoagulation, who is admitted to [facility] on 11/03/2020 with small bowel obstruction in the setting of incarcerated ventral abdominal hernia after presenting from home to [facility] ED  complaining of abdominal pain.    Principal Problem:   SBO (small bowel obstruction) (HCC) Active Problems:   PAF (paroxysmal atrial fibrillation) (HCC)   Acquired hypothyroidism   Incarcerated ventral hernia   Nausea   Abdominal pain   AKI (acute kidney injury) ([place])   Leukocytosis   Acute cystitis   Hypertension   Diabetes ([place])     #) Small bowel obstruction: In the setting of 7 to 10 days of progressive abdominal pain associated with nausea, diminished last production, and significant decline in stool output, now passing minimal loose stool, CT abdomen/pelvis shows evidence of mechanical distal small bowel obstruction due to incarcerated distal small bowel loop within a moderate sized periumbilical ventral hernia, in the absence of any evidence of perforation or abscess.  This is in the context of the ventral hernia becoming reducible per the patient after being  able to reduce this hernia over the last 40 years.  While patient does have a history of multiple prior abdominal surgeries, it appears that the incarcerated ventral wall hernia is the precipitating factor leading to his small bowel obstruction.  No evidence of peritoneal signs on initial physical exam.  On-call general surgery, Dr. Bobbye Morton, consulted, as above, who recommends NG T placement and asked that no manual reduction of ventral hernia be attempted.  We will proceed with conservative measures, while awaiting additional evaluation via formal general surgery consult.  Of note, no evidence to suggest acute MI or acutely decompensated heart failure.  She is on daily Plavix as result outpatient blood thinner, with most recent dose occurring earlier this morning (11/03/20).    Plan: Strict NPO. LR @ 100 cc per hour. Prn IV fentanyl. Prn IV Zofran. NG tube attached to low, intermittent wall suction. SCD's for DVT prophylaxis.  Recheck CMP and CBC in the morning. Check INR. General surgery consulted, as above.  Check INR.  Hold home Plavix.      #) Ventral abdominal hernia: 40-year history of this, with recent development of inability to reduce hernia, with ensuing imaging in the context of 7 to 10 days of progressive abdominal discomfort demonstrating development of incarceration of this ventral hernia contributing to presenting small bowel obstruction, as further described above.  General surgery consulted, as above.  Plan: NPO.  IV fentanyl, as above.  Repeat CBC in the morning.  Check INR.  General surgery consulted.  Hold home Plavix.      #) Acute kidney injury: Presenting creatinine noted to be 1.5 relative to most recent prior value 0.80 on 10/08/2020.  Appears prerenal in the setting of dehydration stemming from recent decline in oral intake in the context of development of small bowel obstruction with incarcerated ventral hernia.  Presenting urinalysis was notable for greater than 300 protein  as well as positive for hyaline cast, consistent dehydration.  Of note, the patient is also on lisinopril as an outpatient.   Plan: Continuous IV fluids, as above.  Monitor strict I's and O's and daily weights.  Attempt to avoid nephrotoxic agents.  Hold home lisinopril.  Add on random urine sodium as well as random urine creatinine.  Repeat BMP in the morning.      #) Acute cystitis: Diagnosis on the basis of generalized abdominal discomfort as well as nausea, with presenting urinalysis associated with a cloudy specimen demonstrating greater than 50 white blood cells, many bacteria, large leukocyte esterase, no squamous epithelial cells.  While the patient's presenting abdominal pain/nausea is likely predominantly from her small bowel  obstruction in the setting of incarcerated ventral hernia, these presenting pathologies present confounding variables that do not illuminate the possibility of some degree of the symptoms stemming from an underlying UTI, in the setting of significant pyuria and bacteriuria on initial urinalysis.  Consequently, will treat this scenario as if it represents a true underlying urinary tract infection, providing 3 to 5 days of antibiotic coverage for uncomplicated urinary tract infection, without evidence of acute pyelonephritis.  Additionally, presenting leukocytosis represents sole SIRS criteria at this time, and consequently criteria are not met for the patient to be considered septic at this time.  Consequently, will not check lactate at this time.  No evidence of hypotension.  Plan: Continuous IV fluids, as above.  Collect blood cultures x2 before initiation of Rocephin.  Add on urine culture, with close monitoring for ensuing culture results and sensitivities to guide ultimate narrowing of antibiotic spectrum.  Repeat CBC with differential in the morning.      #) Type 2 diabetes mellitus: Outpatient diabetic regimen consists of metformin in the absence of any insulin  use at home.  Presenting blood sugar per CMP noted to be 91.   Plan: Hold home metformin.  Accu-Cheks every 6 hours in the setting of n.p.o. status, with associated low-dose sliding scale insulin.      #) Acquired hypothyroidism: On thyroid supplementation as an outpatient.  Plan: In the setting of current n.p.o. status, hold home thyroid supplementation for now.      #) Hypertension: Outpatient hypertensive regimen includes lisinopril as well as Lopressor.  Presenting systolic blood pressures have been normotensive thus far, without evidence of hypotension.   Plan: Continue current n.p.o. status, will hold home lisinopril as well as Lopressor for now, with additional indication to hold home lisinopril in the form of presenting acute kidney injury, as further described above.  Close monitoring of ensuing blood pressure via routine vital signs.        #) Paroxysmal atrial fibrillation: Documented history of such. In the setting of a CHA2DS2-VASc score of 3, there is an indication for the patient to be on chronic anticoagulation for thromboembolic prophylaxis.  However, the patient denies formal anticoagulation, rather is on daily Plavix.  Currently unclear to me why she is not formally anticoagulated.  Home AV nodal blocking regimen consists of Lopressor.  Plan: monitor strict I's & O's and daily weights. Repeat BMP and CBC in the morning. Check serum magnesium level.  Holding home blocker for now in the setting of current n.p.o. status.  Monitor on telemetry.        DVT prophylaxis: SCDs Code Status: Full code Family Communication: none Disposition Plan: Per Rounding Team Consults called: Dr. Bobbye Morton of general surgery consulted, as further described above Admission status: Inpatient; med telemetry     Of note, this patient was added by me to the following Admit List/Treatment Team: [email or web address].      PLEASE NOTE THAT DRAGON DICTATION SOFTWARE WAS USED IN THE  CONSTRUCTION OF THIS NOTE.   [place] Pager [phone] From [place]  Otherwise, please contact night-coverage  [email or web address] Password [facility]   11/03/2020, 8:19 PM

## 2020-11-03 NOTE — Consult Note (Signed)
Reason for Consult/Chief Complaint: incarcerated incisional hernia Consultant: Hyman Hopes, [place]  Erin Mitchell is an 75 y.o. female.   HPI: 61F with an incisional hernia present x30y. She states she had an open repair with mesh in 1990 by a surgeon in [place] with post-op recurrence. She initially presented to UC for abdominal pain and diarrhea for the last 9d and underwent CT scan notable for incarcerated umbilical hernia and was transferred to [facility]. She denies any n/v.   Past Medical History:  Diagnosis Date  . Atrial fibrillation (HCC)   . Diabetes (HCC)   . Exposure to [employer]   . Hypertension   . Kidney disease   . Low thyroid stimulating hormone (TSH) level   . Thyroid disease     Past Surgical History:  Procedure Laterality Date  . APPENDECTOMY    . CESAREAN SECTION    . CHOLECYSTECTOMY    . HERNIA REPAIR    . 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 Hypertension Father   . Diabetes Brother   . Emphysema Mother     Social History:  reports that she quit smoking about 50 years ago. Her smoking use included cigarettes. She has a 0.25 pack-year smoking history. She has never used smokeless tobacco. She reports that she does not drink alcohol and does not use drugs.  Allergies: No Known Allergies  Medications: I have reviewed the patient's current medications.  Results for orders placed or performed during the hospital encounter of 11/03/20 (from the past 48 hour(s))  CBC with Differential     Status: Abnormal   Collection Time: 11/03/20  6:20 PM  Result Value Ref Range   WBC 13.7 (H) 4.0 - 10.5 K/uL   RBC 4.62 3.87 - 5.11 MIL/uL   Hemoglobin 13.4 12.0 - 15.0 g/dL   HCT 61.4 43.1 - 54.0 %   MCV 87.4 80.0 - 100.0 fL   MCH 29.0 26.0 - 34.0 pg   MCHC 33.2 30.0 - 36.0 g/dL   RDW 08.6 76.1 - 95.0 %   Platelets 321 150 - 400 K/uL   nRBC 0.0 0.0 - 0.2 %   Neutrophils Relative % 70 %   Neutro Abs 9.6 (H) 1.7 - 7.7 K/uL    Lymphocytes Relative 18 %   Lymphs Abs 2.5 0.7 - 4.0 K/uL   Monocytes Relative 8 %   Monocytes Absolute 1.1 (H) 0.1 - 1.0 K/uL   Eosinophils Relative 3 %   Eosinophils Absolute 0.4 0.0 - 0.5 K/uL   Basophils Relative 0 %   Basophils Absolute 0.1 0.0 - 0.1 K/uL   Immature Granulocytes 1 %   Abs Immature Granulocytes 0.10 (H) 0.00 - 0.07 K/uL    Comment: Performed at [facility], [address], [place], [place] [address]  Type and screen [facility]     Status: None (Preliminary result)   Collection Time: 11/03/20  6:26 PM  Result Value Ref Range   ABO/RH(D) PENDING    Antibody Screen PENDING    Sample Expiration      11/06/2020,2359 Performed at [facility], [address] [place], [place] [address]     CT ABDOMEN PELVIS WO CONTRAST  Result Date: 11/03/2020 CLINICAL DATA:  Left upper quadrant abdominal pain and diarrhea for 9 days. Episode of bloody stools. Weight loss of 8-9 pounds. Prior appendectomy and cholecystectomy. History of diverticulitis. EXAM:  CT ABDOMEN AND PELVIS WITHOUT CONTRAST TECHNIQUE: Multidetector CT imaging of the abdomen and pelvis was performed following the standard protocol without IV contrast. COMPARISON:  None. FINDINGS: Lower chest: No significant pulmonary nodules or acute consolidative airspace disease. Hepatobiliary: Normal liver size. No liver mass. Cholecystectomy. No biliary ductal dilatation. Pancreas: Normal, with no mass or duct dilation. Spleen: Normal size. No mass. Adrenals/Urinary Tract: Normal adrenals. No renal stones. No hydronephrosis. No contour deforming renal masses. Normal bladder. Stomach/Bowel: Normal non-distended stomach. Moderate periumbilical ventral abdominal hernia containing an incarcerated distal small bowel loop, proximal to which the small bowel is diffusely mildly dilated and fluid-filled (up to 3.1 cm diameter), and distal to which the small bowel is relatively collapsed, compatible with  mechanical distal small bowel obstruction. No thick walled small bowel loops. No pneumatosis. Appendectomy. Moderate diffuse colonic diverticulosis with no large bowel wall thickening or significant pericolonic fat stranding. Vascular/Lymphatic: Atherosclerotic nonaneurysmal abdominal aorta. 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 No pathologically enlarged lymph nodes in the abdomen or pelvis. Reproductive: Fluid and gas mildly distends the uterine cavity. Simple 3.1 cm right adnexal cyst (series 2/image 64). No left adnexal mass. Other: No pneumoperitoneum, ascites or focal fluid collection. Musculoskeletal: No aggressive appearing focal osseous lesions. Mild thoracolumbar spondylosis. IMPRESSION: 1. Mechanical distal small bowel obstruction due to incarcerated distal small bowel loop within a moderate size periumbilical ventral abdominal hernia. No free air. No abscess. No pneumatosis. Surgical consultation advised. 2. Moderate diffuse colonic diverticulosis with no evidence of acute diverticulitis. 3. Fluid and gas mildly distends the uterine cavity, nonspecific. Recommend correlation with pelvic ultrasound on a short term outpatient basis. 4. Simple 3.1 cm right adnexal cyst. Recommend follow-up US in 6-12 months. Note: This recommendation does not apply to premenarchal patients and to those with increased risk (genetic, family history, elevated tumor markers or other high-risk factors) of ovarian cancer. Reference: JACR 2020 Feb; 17(2):248-254 5. Aortic Atherosclerosis (ICD10-I70.0). Electronically Signed   By: Delbert Phenix M.D.   On: 11/03/2020 13:46    ROS 10 point review of systems is negative except as listed above in HPI.   Physical Exam Blood pressure (!) 160/82, pulse 94, temperature (!) 97.5 F (36.4 C), temperature source Oral, resp. rate 15, SpO2 98 %. Constitutional: well-developed,  well-nourished HEENT: pupils equal, round, reactive to light, 59mm b/l, moist conjunctiva, external inspection of ears and nose normal, hearing intact Oropharynx: normal oropharyngeal mucosa, normal dentition Neck: no thyromegaly, trachea midline, no midline cervical tenderness to palpation Chest: breath sounds equal bilaterally, normal respiratory effort, no midline or lateral chest wall tenderness to palpation/deformity Abdomen: soft, mildly TTP, incarcerated incisional hernia, reduced on my exam, no bruising, no hepatosplenomegaly GU: normal female genitalia  Back: no wounds, no thoracic/lumbar spine tenderness to palpation, no thoracic/lumbar spine stepoffs Rectal: deferred Extremities: surgically resected toes on R foot, motor and sensation intact to bilateral UE and LE, + peripheral edema b/l MSK: unable to assess gait/station, no clubbing/cyanosis of fingers/toes, normal ROM of all four extremities Skin: warm, dry, no rashes Psych: normal memory, normal mood/affect    Assessment/Plan: 47F with incarcerated incisional hernia, reduced on exam by me in ED  Recommend admission for hydration, assessment of ROBF, and AM evaluation for operative repair. Discussed operative intervention and patient is agreeable. Noted patient to be DNR and informed of rescind of DNR during surgery. Further discussion reveals a lack of clarity/understanding from the patient about  agreement to DNR. She states previous admissions/presentation to the hospital she has been full code. In light of this, changed status back to full code and will engage palliative for further discussion and inclusion of the patient's children in the conversation regarding code status change. NGT in place, advanced, now in good position on AXR. O/p is clear.    Diamantina Monks, MD General and Trauma Surgery [facility]

## 2020-11-03 NOTE — ED Provider Notes (Signed)
[facility] [place] EMERGENCY DEPARTMENT Provider Note   CSN: [id number] Arrival date & time: 11/03/20  1552     History Chief Complaint  Patient presents with  . Abdominal Pain    Erin Mitchell is a 75 y.o. female.  HPI Patient is a 74 year old female with a medical history as noted below.  She presents to the emergency department from urgent care due to SBO as well as an incarcerated ventral hernia.  Patient states that she has had an incisional ventral hernia for more than 40 years status post an appendectomy.  She states that they attempted to repair it in 1997 unsuccessfully.  She states that she has always been able to reduce the hernia herself until this week.  She states that about 9 days ago she has been experiencing diarrhea.  Her diarrhea occurs and worsens after eating.  1 week ago she had an evening where she noticed small amount of blood and mucus in her stools but states that this is not occurred since.  She reports mild tenderness and an increase in firmness at the site but otherwise has no other complaints.  No vomiting, chest pain, shortness of breath, or urinary complaints.    Past Medical History:  Diagnosis Date  . Atrial fibrillation ([place])   . Diabetes ([place])   . Exposure to [employer]   . Hypertension   . Kidney disease   . Low thyroid stimulating hormone (TSH) level   . Thyroid disease     Patient Active Problem List   Diagnosis Date Noted  . Type 2 diabetes mellitus with diabetic peripheral angiopathy and gangrene, without long-term current use of insulin ([place]) 10/08/2020  . PAF (paroxysmal atrial fibrillation) ([place]) 10/08/2020  . Acquired hypothyroidism 10/08/2020  . PAD (peripheral artery disease) ([place]) 10/08/2020  . Urinary frequency 10/08/2020  . History of amputation of toe ([place]) 10/08/2020  . Agent orange exposure 10/08/2020    Past Surgical History:  Procedure Laterality Date  . APPENDECTOMY    . CESAREAN SECTION    .  CHOLECYSTECTOMY    . HERNIA REPAIR    . 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     Family History  Problem Relation Age of Onset  . Hypertension Father   . Diabetes Brother   . Emphysema Mother     Social History   Tobacco Use  . Smoking status: Former Smoker    Packs/day: 0.25    Years: 1.00    Pack years: 0.25    Types: Cigarettes    Quit date: 06/06/1970    Years since quitting: 50.4  . Smokeless tobacco: Never Used  Vaping Use  . Vaping Use: Never used  Substance Use Topics  . Alcohol use: Never  . Drug use: Never    Home Medications Prior to Admission medications   Medication Sig Start Date End Date Taking? Authorizing Provider  Alcohol Swabs (B-D SINGLE USE SWABS REGULAR) PADS Use as directed. 10/12/20   Luetta Nutting, DO  Blood Glucose Monitoring Suppl (TRUE METRIX METER) w/Device KIT Use as directed to test blood glucose daily 10/12/20   Luetta Nutting, DO  clopidogrel (PLAVIX) 75 MG tablet TAKE 1 TABLET BY MOUTH ONCE DAILY 10/12/20   Luetta Nutting, DO  glucose blood (TRUE METRIX BLOOD GLUCOSE TEST) test strip Use as instructed 10/12/20   Luetta Nutting, DO  lisinopril (ZESTRIL) 20 MG tablet TAKE 1 TABLET  BY MOUTH TWICE DAILY 10/12/20   Luetta Nutting, DO  Loperamide HCl (IMODIUM PO) Take 1 tablet by mouth 2 (two) times daily as needed.    [provider]  metFORMIN (GLUCOPHAGE) 500 MG tablet Take 1 tablet (500 mg total) by mouth 2 (two) times daily with a meal. 10/16/20 04/14/21  Luetta Nutting, DO  metoprolol tartrate (LOPRESSOR) 25 MG tablet TAKE 1 TABLET BY MOUTH TWICE DAILY WITH FOOD 10/12/20   Luetta Nutting, DO  rosuvastatin (CRESTOR) 20 MG tablet TAKE 1 TABLET BY MOUTH ONCE DAILY 10/12/20   Luetta Nutting, DO  thyroid (ARMOUR) 90 MG tablet NP Thyroid 90 mg tablet  TAKE 1 TABLET BY MOUTH EVERY DAY    [provider]  TRUEplus Lancets 33G MISC Use as directed daily. 10/12/20   Luetta Nutting, DO    Allergies     Patient has no known allergies.  Review of Systems   Review of Systems  All other systems reviewed and are negative. Ten systems reviewed and are negative for acute change, except as noted in the HPI.   Physical Exam Updated Vital Signs BP (!) 145/81   Pulse 78   Temp (!) 97.5 F (36.4 C) (Oral)   Resp 12   SpO2 100%   Physical Exam Vitals and nursing note reviewed.  Constitutional:      General: She is not in acute distress.    Appearance: Normal appearance. She is not ill-appearing, toxic-appearing or diaphoretic.  HENT:     Head: Normocephalic and atraumatic.     Right Ear: External ear normal.     Left Ear: External ear normal.     Nose: Nose normal.     Mouth/Throat:     Mouth: Mucous membranes are moist.     Pharynx: Oropharynx is clear. No oropharyngeal exudate or posterior oropharyngeal erythema.  Eyes:     Extraocular Movements: Extraocular movements intact.  Cardiovascular:     Rate and Rhythm: Normal rate and regular rhythm.     Pulses: Normal pulses.     Heart sounds: Normal heart sounds. No murmur heard. No friction rub. No gallop.   Pulmonary:     Effort: Pulmonary effort is normal. No respiratory distress.     Breath sounds: Normal breath sounds. No stridor. No wheezing, rhonchi or rales.  Abdominal:     General: Abdomen is flat.     Tenderness: There is abdominal tenderness in the periumbilical area.     Comments: Abdomen is flat and soft.  Large ventral hernia noted.  Feels firm to the touch.  Mild tenderness overlying the hernia.  Unable to reduce.  Musculoskeletal:        General: Normal range of motion.     Cervical back: Normal range of motion and neck supple. No tenderness.  Skin:    General: Skin is warm and dry.  Neurological:     General: No focal deficit present.     Mental Status: She is alert and oriented to person, place, and time.  Psychiatric:        Mood and Affect: Mood normal.        Behavior: Behavior normal.    ED Results /  Procedures / Treatments   Labs (all labs ordered are listed, but only abnormal results are displayed) Labs Reviewed  COMPREHENSIVE METABOLIC PANEL - Abnormal; Notable for the following components:      Result Value   CO2 19 (*)    BUN 29 (*)    Creatinine,  Ser 1.25 (*)    GFR, Estimated 46 (*)    All other components within normal limits  CBC WITH DIFFERENTIAL/PLATELET - Abnormal; Notable for the following components:   WBC 13.7 (*)    Neutro Abs 9.6 (*)    Monocytes Absolute 1.1 (*)    Abs Immature Granulocytes 0.10 (*)    All other components within normal limits  RESP PANEL BY RT-PCR (FLU A&B, COVID) ARPGX2  LIPASE, BLOOD  TYPE AND SCREEN  ABO/RH   EKG None  Radiology CT ABDOMEN PELVIS WO CONTRAST  Result Date: 11/03/2020 CLINICAL DATA:  Left upper quadrant abdominal pain and diarrhea for 9 days. Episode of bloody stools. Weight loss of 8-9 pounds. Prior appendectomy and cholecystectomy. History of diverticulitis. EXAM: CT ABDOMEN AND PELVIS WITHOUT CONTRAST TECHNIQUE: Multidetector CT imaging of the abdomen and pelvis was performed following the standard protocol without IV contrast. COMPARISON:  None. FINDINGS: Lower chest: No significant pulmonary nodules or acute consolidative airspace disease. Hepatobiliary: Normal liver size. No liver mass. Cholecystectomy. No biliary ductal dilatation. Pancreas: Normal, with no mass or duct dilation. Spleen: Normal size. No mass. Adrenals/Urinary Tract: Normal adrenals. No renal stones. No hydronephrosis. No contour deforming renal masses. Normal bladder. Stomach/Bowel: Normal non-distended stomach. Moderate periumbilical ventral abdominal hernia containing an incarcerated distal small bowel loop, proximal to which the small bowel is diffusely mildly dilated and fluid-filled (up to 3.1 cm diameter), and distal to which the small bowel is relatively collapsed, compatible with mechanical distal small bowel obstruction. No thick walled small bowel  loops. No pneumatosis. Appendectomy. Moderate diffuse colonic diverticulosis with no large bowel wall thickening or significant pericolonic fat stranding. Vascular/Lymphatic: Atherosclerotic nonaneurysmal abdominal aorta. 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 No pathologically enlarged lymph nodes in the abdomen or pelvis. Reproductive: Fluid and gas mildly distends the uterine cavity. Simple 3.1 cm right adnexal cyst (series 2/image 64). No left adnexal mass. Other: No pneumoperitoneum, ascites or focal fluid collection. Musculoskeletal: No aggressive appearing focal osseous lesions. Mild thoracolumbar spondylosis. IMPRESSION: 1. Mechanical distal small bowel obstruction due to incarcerated distal small bowel loop within a moderate size periumbilical ventral abdominal hernia. No free air. No abscess. No pneumatosis. Surgical consultation advised. 2. Moderate diffuse colonic diverticulosis with no evidence of acute diverticulitis. 3. Fluid and gas mildly distends the uterine cavity, nonspecific. Recommend correlation with pelvic ultrasound on a short term outpatient basis. 4. Simple 3.1 cm right adnexal cyst. Recommend follow-up US in 6-12 months. Note: This recommendation does not apply to premenarchal patients and to those with increased risk (genetic, family history, elevated tumor markers or other high-risk factors) of ovarian cancer. Reference: JACR 2020 Feb; 17(2):248-254 5. Aortic Atherosclerosis (ICD10-I70.0). Electronically Signed   By: Ilona Sorrel M.D.   On: 11/03/2020 13:46   Procedures Procedures   Medications Ordered in ED Medications - No data to display  ED Course  I have reviewed the triage vital signs and the nursing notes.  Pertinent labs & imaging results that were available during my care of the patient were reviewed by me and considered in my medical decision making (see  chart for details).  Clinical Course as of 11/03/20 1952  Tue Nov 03, 2020  1909 I spoke to general surgery.  They would not like [place] to proceed with reduction of the hernia.  Recommended NG tube placement, basic labs, COVID-19 screening, and admission to medicine.   [LJ]  Clinical Course User Index [LJ] Rayna Sexton, PA-C   MDM Rules/Calculators/A&P                          Pt is a 75 y.o. female who presents to the emergency department due to incarcerated ventral hernia as well as small bowel obstruction.  Patient discussed with general surgery who request admission to the medicine team.  They will consult and likely take patient to the OR.  Basic labs obtained as well as a COVID-19 test.  Patient with minimal pain at this time.  No acute distress noted.  We will discussed with the medicine team for admission.  Note: Portions of this report may have been transcribed using voice recognition software. Every effort was made to ensure accuracy; however, inadvertent computerized transcription errors may be present.   Final Clinical Impression(s) / ED Diagnoses Final diagnoses:  Incarcerated hernia  SBO (small bowel obstruction) [facility])    Rx / DC Orders ED Discharge Orders    None       Rayna Sexton, PA-C 11/03/20 1952    Davonna Belling, MD 11/03/20 2357

## 2020-11-03 NOTE — ED Provider Notes (Signed)
Emergency Medicine Provider Triage Evaluation Note  Erin Mitchell , a 75 y.o. female  was evaluated in triage.  Pt complains of diarrhea for the past 9 days. Pt also complains of mild abdominal discomfort. She has had an umbilical hernia for > 30 years after appendectomy and it has never bothered her much. She went to UC today and had a CT scan (seen below) and advised to come to the ED for further eval. It appears Dr. Gerrit Friends has been made aware pt is coming.   IMPRESSION: 1. Mechanical distal small bowel obstruction due to incarcerated distal small bowel loop within a moderate size periumbilical ventral abdominal hernia. No free air. No abscess. No pneumatosis. Surgical consultation advised. 2. Moderate diffuse colonic diverticulosis with no evidence of acute diverticulitis. 3. Fluid and gas mildly distends the uterine cavity, nonspecific. Recommend correlation with pelvic ultrasound on a short term outpatient basis. 4. Simple 3.1 cm right adnexal cyst. Recommend follow-up US in 6-12 months. Note: This recommendation does not apply to premenarchal patients and to those with increased risk (genetic, family history, elevated tumor markers or other high-risk factors) of ovarian cancer. Reference: JACR 2020 Feb; 17(2):248-254 5. Aortic Atherosclerosis (ICD10-I70.0). Review of Systems  Positive: + abdominal pain, diarrhea Negative: - vomiting  Physical Exam  BP 117/71 (BP Location: Left Arm)   Pulse 76   Temp (!) 97.5 F (36.4 C) (Oral)   Resp 13   SpO2 99%  Gen:   Awake, no distress   Resp:  Normal effort  MSK:   Moves extremities without difficulty  Other:  Decreased BS with umbilical hernia present  Medical Decision Making  Medically screening exam initiated at 6:18 PM.  Appropriate orders placed.  Erin Mitchell was informed that the remainder of the evaluation will be completed by another provider, this initial triage assessment does not replace that evaluation, and the  importance of remaining in the ED until their evaluation is complete.     Tanda Rockers, PA-C 11/03/20 Franki Cabot, MD 11/03/20 [phone]

## 2020-11-04 ENCOUNTER — Inpatient Hospital Stay (HOSPITAL_COMMUNITY): Payer: Medicare HMO

## 2020-11-04 ENCOUNTER — Encounter (HOSPITAL_COMMUNITY): Payer: Self-pay | Admitting: Internal Medicine

## 2020-11-04 ENCOUNTER — Encounter (HOSPITAL_COMMUNITY)
Admission: EM | Disposition: A | Discharge status: Home / Self Care | Payer: Self-pay | Source: Home / Self Care | Attending: Internal Medicine

## 2020-11-04 DIAGNOSIS — K436 Other and unspecified ventral hernia with obstruction, without gangrene: Secondary | ICD-10-CM | POA: Diagnosis present

## 2020-11-04 DIAGNOSIS — R110 Nausea: Secondary | ICD-10-CM | POA: Diagnosis present

## 2020-11-04 DIAGNOSIS — D72829 Elevated white blood cell count, unspecified: Secondary | ICD-10-CM | POA: Diagnosis present

## 2020-11-04 DIAGNOSIS — Z7189 Other specified counseling: Secondary | ICD-10-CM

## 2020-11-04 DIAGNOSIS — I10 Essential (primary) hypertension: Secondary | ICD-10-CM | POA: Diagnosis present

## 2020-11-04 DIAGNOSIS — N179 Acute kidney failure, unspecified: Secondary | ICD-10-CM | POA: Diagnosis present

## 2020-11-04 DIAGNOSIS — Z01810 Encounter for preprocedural cardiovascular examination: Secondary | ICD-10-CM

## 2020-11-04 DIAGNOSIS — R1084 Generalized abdominal pain: Secondary | ICD-10-CM | POA: Diagnosis not present

## 2020-11-04 DIAGNOSIS — N3000 Acute cystitis without hematuria: Secondary | ICD-10-CM | POA: Diagnosis present

## 2020-11-04 DIAGNOSIS — E119 Type 2 diabetes mellitus without complications: Secondary | ICD-10-CM

## 2020-11-04 DIAGNOSIS — R109 Unspecified abdominal pain: Secondary | ICD-10-CM | POA: Diagnosis present

## 2020-11-04 DIAGNOSIS — K56609 Unspecified intestinal obstruction, unspecified as to partial versus complete obstruction: Secondary | ICD-10-CM | POA: Diagnosis not present

## 2020-11-04 LAB — CBC WITH DIFFERENTIAL/PLATELET
Abs Immature Granulocytes: 0.14 10*3/uL — ABNORMAL HIGH (ref 0.00–0.07)
Absolute Monocytes: 679 cells/uL (ref 200–950)
Basophils Absolute: 39 cells/uL (ref 0–200)
Basophils Absolute: 0 10*3/uL (ref 0.0–0.1)
Basophils Relative: 0.4 %
Basophils Relative: 0 %
Eosinophils Absolute: 349 cells/uL (ref 15–500)
Eosinophils Absolute: 0.1 10*3/uL (ref 0.0–0.5)
Eosinophils Relative: 3.6 %
Eosinophils Relative: 1 %
HCT: 39.7 % (ref 35.0–45.0)
HCT: 35.9 % — ABNORMAL LOW (ref 36.0–46.0)
Hemoglobin: 12.4 g/dL (ref 11.7–15.5)
Hemoglobin: 11.8 g/dL — ABNORMAL LOW (ref 12.0–15.0)
Immature Granulocytes: 1 %
Lymphocytes Relative: 12 %
Lymphs Abs: 1824 cells/uL (ref 850–3900)
Lymphs Abs: 1.9 10*3/uL (ref 0.7–4.0)
MCH: 27.6 pg (ref 27.0–33.0)
MCH: 29 pg (ref 26.0–34.0)
MCHC: 31.2 g/dL — ABNORMAL LOW (ref 32.0–36.0)
MCHC: 32.9 g/dL (ref 30.0–36.0)
MCV: 88.2 fL (ref 80.0–100.0)
MCV: 88.2 fL (ref 80.0–100.0)
MPV: 10.2 fL (ref 7.5–12.5)
Monocytes Absolute: 0.9 10*3/uL (ref 0.1–1.0)
Monocytes Relative: 7 %
Monocytes Relative: 6 %
Neutro Abs: 6809 cells/uL (ref 1500–7800)
Neutro Abs: 12.9 10*3/uL — ABNORMAL HIGH (ref 1.7–7.7)
Neutrophils Relative %: 70.2 %
Neutrophils Relative %: 80 %
Platelets: 314 10*3/uL (ref 140–400)
Platelets: 300 10*3/uL (ref 150–400)
RBC: 4.5 10*6/uL (ref 3.80–5.10)
RBC: 4.07 MIL/uL (ref 3.87–5.11)
RDW: 12.8 % (ref 11.0–15.0)
RDW: 13 % (ref 11.5–15.5)
Total Lymphocyte: 18.8 %
WBC: 9.7 10*3/uL (ref 3.8–10.8)
WBC: 16 10*3/uL — ABNORMAL HIGH (ref 4.0–10.5)
nRBC: 0 % (ref 0.0–0.2)

## 2020-11-04 LAB — C DIFFICILE QUICK SCREEN W PCR REFLEX
C Diff antigen: NEGATIVE
C Diff interpretation: NOT DETECTED
C Diff toxin: NEGATIVE

## 2020-11-04 LAB — COMPREHENSIVE METABOLIC PANEL
AG Ratio: 1.8 (calc) (ref 1.0–2.5)
ALT: 18 U/L (ref 6–29)
ALT: 19 U/L (ref 0–44)
AST: 20 U/L (ref 10–35)
AST: 19 U/L (ref 15–41)
Albumin: 4.2 g/dL (ref 3.6–5.1)
Albumin: 3.5 g/dL (ref 3.5–5.0)
Alkaline Phosphatase: 71 U/L (ref 38–126)
Alkaline phosphatase (APISO): 81 U/L (ref 37–153)
Anion gap: 17 — ABNORMAL HIGH (ref 5–15)
BUN/Creatinine Ratio: 31 (calc) — ABNORMAL HIGH (ref 6–22)
BUN: 27 mg/dL — ABNORMAL HIGH (ref 7–25)
BUN: 39 mg/dL — ABNORMAL HIGH (ref 8–23)
CO2: 21 mmol/L (ref 20–32)
CO2: 17 mmol/L — ABNORMAL LOW (ref 22–32)
Calcium: 9.3 mg/dL (ref 8.6–10.4)
Calcium: 8.9 mg/dL (ref 8.9–10.3)
Chloride: 107 mmol/L (ref 98–110)
Chloride: 102 mmol/L (ref 98–111)
Creat: 0.88 mg/dL (ref 0.60–0.93)
Creatinine, Ser: 2.01 mg/dL — ABNORMAL HIGH (ref 0.44–1.00)
GFR, Estimated: 26 mL/min — ABNORMAL LOW (ref >60)
Globulin: 2.3 g/dL (calc) (ref 1.9–3.7)
Glucose, Bld: 150 mg/dL — ABNORMAL HIGH (ref 65–99)
Glucose, Bld: 140 mg/dL — ABNORMAL HIGH (ref 70–99)
Potassium: 4.6 mmol/L (ref 3.5–5.3)
Potassium: 4.2 mmol/L (ref 3.5–5.1)
Sodium: 139 mmol/L (ref 135–146)
Sodium: 136 mmol/L (ref 135–145)
Total Bilirubin: 0.5 mg/dL (ref 0.2–1.2)
Total Bilirubin: 1.1 mg/dL (ref 0.3–1.2)
Total Protein: 6.5 g/dL (ref 6.1–8.1)
Total Protein: 6.3 g/dL — ABNORMAL LOW (ref 6.5–8.1)

## 2020-11-04 LAB — PROTIME-INR
INR: 1.1 (ref 0.8–1.2)
Prothrombin Time: 14.3 seconds (ref 11.4–15.2)

## 2020-11-04 LAB — URINALYSIS, ROUTINE W REFLEX MICROSCOPIC
Bilirubin Urine: NEGATIVE
Glucose, UA: NEGATIVE mg/dL
Hgb urine dipstick: NEGATIVE
Ketones, ur: NEGATIVE mg/dL
Nitrite: NEGATIVE
Protein, ur: >=300 mg/dL — AB
Specific Gravity, Urine: 1.019 (ref 1.005–1.030)
WBC, UA: >50 WBC/hpf — ABNORMAL HIGH (ref 0–5)
pH: 5 (ref 5.0–8.0)

## 2020-11-04 LAB — CBG MONITORING, ED
Glucose-Capillary: 133 mg/dL — ABNORMAL HIGH (ref 70–99)
Glucose-Capillary: 140 mg/dL — ABNORMAL HIGH (ref 70–99)

## 2020-11-04 LAB — GLUCOSE, CAPILLARY
Glucose-Capillary: 131 mg/dL — ABNORMAL HIGH (ref 70–99)
Glucose-Capillary: 116 mg/dL — ABNORMAL HIGH (ref 70–99)
Glucose-Capillary: 201 mg/dL — ABNORMAL HIGH (ref 70–99)
Glucose-Capillary: 178 mg/dL — ABNORMAL HIGH (ref 70–99)

## 2020-11-04 LAB — MAGNESIUM: Magnesium: 1.7 mg/dL (ref 1.7–2.4)

## 2020-11-04 LAB — ABO/RH: ABO/RH(D): A POS

## 2020-11-04 SURGERY — REPAIR, HERNIA, VENTRAL
Anesthesia: General

## 2020-11-04 MED ORDER — SODIUM CHLORIDE 0.9 % IV SOLN: 1.0000 g | INTRAVENOUS | Status: DC

## 2020-11-04 MED ORDER — SODIUM CHLORIDE 0.9 % IV SOLN
1.0000 g | Freq: Once | INTRAVENOUS | Status: AC
  Administered 2020-11-04: 1 g via INTRAVENOUS
  Filled 2020-11-04: qty 10

## 2020-11-04 NOTE — Progress Notes (Signed)
PROGRESS NOTE   Erin BargeMarjorie Mitchell  WUJ:[id number]    DOB: 09/18/1945    DOA: 11/03/2020  PCP: Erin Mitchell, [place], DO   I have briefly reviewed patients [facility] medical [facility].  Chief Complaint  Patient presents with  . Abdominal Pain    Brief Narrative:   75 y.o. female with medical history significant for chronic/recurrent ventral abdominal hernia, type 2 diabetes mellitus, acquired hypothyroidism, essential hypertension, paroxysmal atrial fibrillation not on chronic formal anticoagulation, who is [facility] to [facility] on [facility]/03/2020 with small bowel obstruction in the setting of incarcerated ventral abdominal hernia after [facility] complaining of abdominal pain.   Improved after supportive management but as discussed with CCS today, may consider surgical fixation this admission and are requesting Cardiology assistance for preop clearance and management of Plavix.   Assessment & Plan:  Principal Problem:   SBO (small bowel obstruction) (HCC) Active Problems:   PAF (paroxysmal atrial fibrillation) (HCC)   Acquired hypothyroidism   Incarcerated ventral hernia   Nausea   Abdominal pain   AKI (acute kidney injury) (HCC)   Leukocytosis   Acute cystitis   Hypertension   Diabetes (HCC)   Incarcerated ventral incisional hernia with small bowel obstruction: General surgery consulted.  Treated supportively with NPO, NG tube to low wall suction, IV fluids.  Clinically improved.  Hernia mechanically reduced on night of admission by general surgery.  Personally reviewed KUB from this morning 11/04/2020: Enteric tube in distal stomach.  Single prominent loop of bowel within the right hemiabdomen, otherwise no evidence of bowel obstruction.  Oral contrast has progressed throughout the colon.  SBO resolved.  Defer to surgeons regarding initiation of diet.  Plavix on hold in case urgent surgery becomes necessary.  Requested preop cardiac  clearance.  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  Diarrhea preceded her SBO by about 9 to 10 days.  Multiple episodes, loose/watery in consistency without blood or mucus.  No recent antibiotic exposure.  C. difficile testing negative.  GI panel pending.  If work-up negative, and diarrhea continues might consider antimotility agents.  Acute kidney injury: Likely multifactorial due to prerenal from GI losses, poor oral intake along with lisinopril use.  Intermittent soft blood pressures and ATN also possible.  Presented with creatinine of 0.88 which is rapidly increased to 2.01.  Patient reports decreased urine output PTA which has since improved.  Increased IV fluids from 100 to 125 mL/h, strict intake output, avoid nephrotoxins and hypotension.  Trend daily BMP.  If worsens, may consider renal consultation.  Anion gap metabolic acidosis: Due to GI losses.  Management as above.  Follow BMP in AM.  Dilutional anemia: No blood loss.  Trend daily CBC.  Asymptomatic bacteriuria: Patient denies UTI symptoms, has had UTI in the past.  Discontinued empirically started ceftriaxone.  Type II DM with proteinuria/nephropathy: Holding metformin.  SSI for now.  Reasonable inpatient control.  Hold lisinopril due to AKI.  Acquired hypothyroidism: Resume home thyroid supplements when able.  Essential hypertension: Holding lisinopril due to AKI and Lopressor due to soft blood pressures.  Resume Lopressor when able.  Paroxysmal A. fib: CHA2DS2-VASc score of 3.  As per detailed discussion with patient, she had a single episode of A. fib [place], extensively evaluated by cardiology including Holter monitoring  which did not show any further A. fib and even had a TEE due to concerns for endocarditis which was ruled out.  Patient  likely not on anticoagulation due to single postprocedure A. fib episode.  Metoprolol discussion as noted above.  As requested by general surgery, will get cardiology for preop clearance.  Plavix currently on hold  Incidental CT abdomen findings 11/03/2020, outpatient follow-up Moderate diffuse colonic diverticulosis. Fluid and gas mildly distends the uterine cavity, nonspecific.  Pelvic ultrasound as outpatient Simple 3.1 cm right adnexal cyst, recommend follow-up ultrasound in 6 to 12 months.   There is no height or weight on file to calculate BMI.    DVT prophylaxis: SCDs Start: 11/03/20 2017     Code Status: Full Code Family Communication: None at bedside. Disposition:  Status is: Inpatient  Remains inpatient appropriate because:Inpatient level of care appropriate due to severity of illness   Dispo: The patient is from: Home              Anticipated d/c is to: Home              Patient currently is not medically stable to d/c.          Consultants:   General surgery Cardiology  Procedures:   NG tube  Antimicrobials:    Anti-infectives (From admission, onward)   Start     Dose/Rate Route Frequency Ordered Stop   11/05/20 0400  cefTRIAXone (ROCEPHIN) 1 g in sodium chloride 0.9 % 100 mL IVPB        1 g 200 mL/hr over 30 Minutes Intravenous Every 24 hours 11/04/20 0402     11/04/20 0445  cefTRIAXone (ROCEPHIN) 1 g in sodium chloride 0.9 % 100 mL IVPB        1 g 200 mL/hr over 30 Minutes Intravenous  Once 11/04/20 0439 11/04/20 0550        Subjective:  Patient seen early this morning while still in ED.  Reported throat discomfort from NG tube.  Had multiple, 5-6 BMs over prior 6 hours, loose/watery without blood.  Abdominal fullness improved.  No abdominal pain.  Objective:   Vitals:   11/04/20 0730 11/04/20 0815 11/04/20 0851 11/04/20 0945  BP: 113/70 114/72 113/64 (!) 101/58  Pulse: 92 86 85 85  Resp: (!) 22 16 16 17   Temp:   97.8 F (36.6 C) 97.8 F  (36.6 C)  TempSrc:   Oral Oral  SpO2: 94% 98% 99% 98%    General exam: Elderly female, moderately built and nourished, lying comfortably propped up in bed without distress.  Oral mucosa with borderline hydration. Respiratory system: Clear to auscultation. Respiratory effort normal. Cardiovascular system: S1 & S2 heard, RRR. No JVD, murmurs, rubs, gallops or clicks. No pedal edema.  Telemetry personally reviewed: Sinus rhythm. Gastrointestinal system: Abdomen is nondistended, soft and nontender. No organomegaly or masses felt. Normal bowel sounds heard.  Subumbilical midline laparotomy scar.  No visible or palpable ventral hernia at this time Central nervous system: Alert and oriented. No focal neurological deficits. Extremities: Symmetric 5 x 5 power. Skin: No rashes, lesions or ulcers Psychiatry: Judgement and insight appear normal. Mood & affect appropriate.     Data Reviewed:   I have personally reviewed following labs and imaging studies   CBC: Recent Labs  Lab 11/03/20 1139 11/03/20 1820 11/04/20 0454  WBC 9.7 13.7* 16.0*  NEUTROABS 6,809 9.6* 12.9*  HGB 12.4 13.4 11.8*  HCT 39.7 40.4 35.9*  MCV 88.2 87.4 88.2  PLT 314 321 300    Basic Metabolic Panel: Recent Labs  Lab 11/03/20 1139 11/03/20 1820 11/04/20 0454  NA 139 135 136  K 4.6 4.4 4.2  CL 107 102 102  CO2 21 19* 17*  GLUCOSE 150* 91 140*  BUN 27* 29* 39*  CREATININE 0.88 1.25* 2.01*  CALCIUM 9.3 9.3 8.9  MG  --   --  1.7    Liver Function Tests: Recent Labs  Lab 11/03/20 1139 11/03/20 1820 11/04/20 0454  AST 20 25 19   ALT 18 20 19   ALKPHOS  --  75 71  BILITOT 0.5 0.8 1.1  PROT 6.5 7.3 6.3*  ALBUMIN  --  4.0 3.5    CBG: Recent Labs  Lab 11/04/20 0133 11/04/20 0555 11/04/20 0916  GLUCAP 133* 140* 131*    Microbiology Studies:   Recent Results (from the past 240 hour(s))  Resp Panel by RT-PCR (Flu A&B, Covid) Nasopharyngeal Swab     Status: None   Collection Time: 11/03/20  8:48  PM   Specimen: Nasopharyngeal Swab; Nasopharyngeal(NP) swabs in vial transport medium  Result Value Ref Range Status   SARS Coronavirus 2 by RT PCR NEGATIVE NEGATIVE Final    Comment: (NOTE) SARS-CoV-2 target nucleic acids are NOT DETECTED.  The SARS-CoV-2 RNA is generally detectable in upper respiratory specimens during the acute phase of infection. The lowest concentration of SARS-CoV-2 viral copies this assay can detect is 138 copies/mL. A negative result does not preclude SARS-Cov-2 infection and should not be used as the sole basis for treatment or other patient management decisions. A negative result may occur with  improper specimen collection/handling, submission of specimen other than nasopharyngeal swab, presence of viral mutation(s) within the areas targeted by this assay, and inadequate number of viral copies(<138 copies/mL). A negative result must be combined with clinical observations, patient history, and epidemiological information. The expected result is Negative.  Fact Sheet for Patients:  01/04/21  Fact Sheet for Healthcare Providers:  11/05/20  This test is no t yet approved or cleared by the [employer] FDA and  has been authorized for detection and/or diagnosis of SARS-CoV-2 by FDA under an Emergency Use Authorization (EUA). This EUA will remain  in effect (meaning this test can be used) for the duration of the COVID-19 declaration under Section 564(b)(1) of the Act, 21 U.S.C.section 360bbb-3(b)(1), unless the authorization is terminated  or revoked sooner.       Influenza A by PCR NEGATIVE NEGATIVE Final   Influenza B by PCR NEGATIVE NEGATIVE Final    Comment: (NOTE) The Xpert Xpress SARS-CoV-2/FLU/RSV plus assay is intended as an aid in the diagnosis of influenza from Nasopharyngeal swab specimens and should not be used as a sole basis for treatment. Nasal washings and aspirates are  unacceptable for Xpert Xpress SARS-CoV-2/FLU/RSV testing.  Fact Sheet for Patients: SeriousBroker.it  Fact Sheet for Healthcare Providers: [place]  This test is not yet approved or cleared by the [employer] FDA and has been authorized for detection and/or diagnosis of SARS-CoV-2 by FDA under an Emergency Use Authorization (EUA). This EUA will remain in effect (meaning this test can be used) for the duration of the COVID-19 declaration under Section 564(b)(1) of the Act, 21 U.S.C. section 360bbb-3(b)(1), unless the authorization is terminated or revoked.  Performed at [facility], [address], [place], [address] [place]   C Difficile Quick Screen w PCR reflex     Status: None   Collection Time: 11/04/20  7:33  AM   Specimen: STOOL  Result Value Ref Range Status   C Diff antigen NEGATIVE NEGATIVE Final   C Diff toxin NEGATIVE NEGATIVE Final   C Diff interpretation No C. difficile detected.  Final    Comment: Performed at [facility], [address] [place], [place] [address]     Radiology Studies:  CT ABDOMEN PELVIS WO CONTRAST  Result Date: 11/03/2020 CLINICAL DATA:  Left upper quadrant abdominal pain and diarrhea for 9 days. Episode of bloody stools. Weight loss of 8-9 pounds. Prior appendectomy and cholecystectomy. History of diverticulitis. EXAM: CT ABDOMEN AND PELVIS WITHOUT CONTRAST TECHNIQUE: Multidetector CT imaging of the abdomen and pelvis was performed following the standard protocol without IV contrast. COMPARISON:  None. FINDINGS: Lower chest: No significant pulmonary nodules or acute consolidative airspace disease. Hepatobiliary: Normal liver size. No liver mass. Cholecystectomy. No biliary ductal dilatation. Pancreas: Normal, with no mass or duct dilation. Spleen: Normal size. No mass. Adrenals/Urinary Tract: Normal adrenals. No renal stones. No hydronephrosis. No contour deforming renal  masses. Normal bladder. Stomach/Bowel: Normal non-distended stomach. Moderate periumbilical ventral abdominal hernia containing an incarcerated distal small bowel loop, proximal to which the small bowel is diffusely mildly dilated and fluid-filled (up to 3.1 cm diameter), and distal to which the small bowel is relatively collapsed, compatible with mechanical distal small bowel obstruction. No thick walled small bowel loops. No pneumatosis. Appendectomy. Moderate diffuse colonic diverticulosis with no large bowel wall thickening or significant pericolonic fat stranding. Vascular/Lymphatic: Atherosclerotic nonaneurysmal abdominal aorta. 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 No pathologically enlarged lymph nodes in the abdomen or pelvis. Reproductive: Fluid and gas mildly distends the uterine cavity. Simple 3.1 cm right adnexal cyst (series 2/image 64). No left adnexal mass. Other: No pneumoperitoneum, ascites or focal fluid collection. Musculoskeletal: No aggressive appearing focal osseous lesions. Mild thoracolumbar spondylosis. IMPRESSION: 1. Mechanical distal small bowel obstruction due to incarcerated distal small bowel loop within a moderate size periumbilical ventral abdominal hernia. No free air. No abscess. No pneumatosis. Surgical consultation advised. 2. Moderate diffuse colonic diverticulosis with no evidence of acute diverticulitis. 3. Fluid and gas mildly distends the uterine cavity, nonspecific. Recommend correlation with pelvic ultrasound on a short term outpatient basis. 4. Simple 3.1 cm right adnexal cyst. Recommend follow-up US in 6-12 months. Note: This recommendation does not apply to premenarchal patients and to those with increased risk (genetic, family history, elevated tumor markers or other high-risk factors) of ovarian cancer. Reference: JACR 2020 Feb; 17(2):248-254 5. Aortic  Atherosclerosis (ICD10-I70.0). Electronically Signed   By: Delbert Phenix M.D.   On: 11/03/2020 13:46   DG Abd Portable 1V  Result Date: 11/04/2020 CLINICAL DATA:  Abdominal pain EXAM: PORTABLE ABDOMEN - 1 VIEW COMPARISON:  11/03/2020 FINDINGS: Enteric catheter remains positioned within the distal stomach. Enteric contrast has progressed and is now present throughout the colon. Single prominent loop of bowel within the right hemiabdomen, which may represent an air-filled cecum. Otherwise, no evidence of bowel obstruction. IMPRESSION: 1. Enteric tube remains positioned within the distal stomach. 2. Single prominent loop of bowel within the right hemiabdomen, which may represent an air-filled cecum. Otherwise, no evidence of bowel obstruction. Oral contrast has progressed throughout the colon. Electronically Signed   By: Duanne Guess D.O.   On: 11/04/2020 09:21   DG Abd Portable 1 View  Result Date: 11/03/2020 CLINICAL DATA:  Enteric catheter placement EXAM: PORTABLE ABDOMEN -  1 VIEW COMPARISON:  11/03/2020 FINDINGS: Frontal view of the lower chest and upper abdomen demonstrates advancement of the enteric catheter, tip and side port coiled over the gastric antrum. Fluid-filled loops of bowel are seen throughout the abdomen and pelvis. Lung bases are clear. IMPRESSION: 1. Enteric catheter tip coiled over the gastric antrum. Electronically Signed   By: Sharlet Salina M.D.   On: 11/03/2020 23:31   DG Abd Portable 1 View  Result Date: 11/03/2020 CLINICAL DATA:  NG tube placement EXAM: PORTABLE ABDOMEN - 1 VIEW COMPARISON:  None. FINDINGS: NG tube tip is in the proximal stomach with the side port at the GE junction. IMPRESSION: NG tube tip in the stomach. Electronically Signed   By: Charlett Nose M.D.   On: 11/03/2020 22:51     Scheduled Meds:   . insulin aspart  0-6 Units Subcutaneous Q6H    Continuous Infusions:   . [START ON 11/05/2020] cefTRIAXone (ROCEPHIN)  IV    . lactated ringers 125 mL/hr at  11/04/20 1023     LOS: 1 day     Marcellus Scott, MD, [facility], [facility]. [facility]    To contact the attending provider between 7A-7P or the covering provider during after hours 7P-7A, please log into the web site [email or web address] and access using universal [place] password for that web site. If you do not have the password, please call the hospital operator.  11/04/2020, 11:56 AM

## 2020-11-04 NOTE — ED Notes (Signed)
Redness and skin irritation noted below lower belly fold, barrier cream applied to area.

## 2020-11-04 NOTE — Progress Notes (Signed)
Assessment & Plan: HD#2 - incarcerated ventral incisional hernia with small bowel obstruction  Hernia mechanically reduced last PM by Dr. Bedelia Person  Persistent diarrhea - GI workup ongoing per Dr. Nolberto Hanlon pending this AM - may be able to remove NG tube  Holding Plavix - last dose 5/31  Consider cardiology consultation - hx of Afib, valvular heart disease, on Plavix  Patient with recurrent ventral incisional hernia, now with incarcerated small bowel apparently reduced with resolution of obstruction clinically.  GI workup ongoing for 10 day hx of diarrhea.  Holding Plavix in case urgent OR required.  At this time, no urgent need for operative intervention.   May be best to workup GI illness and have patient cleared by cardiology prior to an attempt to repair longstanding ventral incisional hernia.  I discuss repair by laparoscopic technique with patient who is interested in proceeding at some point.  May consider repair on a more elective basis in the near future after these acute issues are addressed.  Will follow with you.        Darnell Level, MD       [facility]       Office: [phone]   Chief Complaint: Incarcerated ventral incisional hernia with SBO, diarrhea  Subjective: Patient in bed, comfortable, on 6N.  Denies abdominal pain.  Persistent diarrhea.  On precautions.  Objective: Vital signs in last 24 hours: Temp:  [97.5 F (36.4 C)-98 F (36.7 C)] 97.8 F (36.6 C) (06/01 0851) Pulse Rate:  [66-94] 85 (06/01 0851) Resp:  [12-22] 16 (06/01 0851) BP: (93-160)/(53-126) 113/64 (06/01 0851) SpO2:  [94 %-100 %] 99 % (06/01 0851) Weight:  [67.1 kg] 67.1 kg (05/31 1050)    Intake/Output from previous day: No intake/output data recorded. Intake/Output this shift: No intake/output data recorded.  Physical Exam: HEENT - sclerae clear, mucous membranes moist Neck - soft Abdomen - soft without distension; non-tender; well healed surgical  incisions; no sign of persistent hernia Ext - no edema, non-tender Neuro - alert & oriented, no focal deficits  Lab Results:  Recent Labs    11/03/20 1820 11/04/20 0454  WBC 13.7* 16.0*  HGB 13.4 11.8*  HCT 40.4 35.9*  PLT 321 300   BMET Recent Labs    11/03/20 1820 11/04/20 0454  NA 135 136  K 4.4 4.2  CL 102 102  CO2 19* 17*  GLUCOSE 91 140*  BUN 29* 39*  CREATININE 1.25* 2.01*  CALCIUM 9.3 8.9   PT/INR Recent Labs    11/04/20 0500  LABPROT 14.3  INR 1.1   Comprehensive Metabolic Panel:    Component Value Date/Time   NA 136 11/04/2020 0454   NA 135 11/03/2020 1820   K 4.2 11/04/2020 0454   K 4.4 11/03/2020 1820   CL 102 11/04/2020 0454   CL 102 11/03/2020 1820   CO2 17 (L) 11/04/2020 0454   CO2 19 (L) 11/03/2020 1820   BUN 39 (H) 11/04/2020 0454   BUN 29 (H) 11/03/2020 1820   CREATININE 2.01 (H) 11/04/2020 0454   CREATININE 1.25 (H) 11/03/2020 1820   CREATININE 0.88 11/03/2020 1139   CREATININE 0.80 10/08/2020 0000   GLUCOSE 140 (H) 11/04/2020 0454   GLUCOSE 91 11/03/2020 1820   CALCIUM 8.9 11/04/2020 0454   CALCIUM 9.3 11/03/2020 1820   AST 19 11/04/2020 0454   AST 25 11/03/2020 1820   ALT 19 11/04/2020 0454   ALT 20 11/03/2020 1820   ALKPHOS 71 11/04/2020  0454   ALKPHOS 75 11/03/2020 1820   BILITOT 1.1 11/04/2020 0454   BILITOT 0.8 11/03/2020 1820   PROT 6.3 (L) 11/04/2020 0454   PROT 7.3 11/03/2020 1820   ALBUMIN 3.5 11/04/2020 0454   ALBUMIN 4.0 11/03/2020 1820    Studies/Results: CT ABDOMEN PELVIS WO CONTRAST  Result Date: 11/03/2020 CLINICAL DATA:  Left upper quadrant abdominal pain and diarrhea for 9 days. Episode of bloody stools. Weight loss of 8-9 pounds. Prior appendectomy and cholecystectomy. History of diverticulitis. EXAM: CT ABDOMEN AND PELVIS WITHOUT CONTRAST TECHNIQUE: Multidetector CT imaging of the abdomen and pelvis was performed following the standard protocol without IV contrast. COMPARISON:  None. FINDINGS: Lower chest:  No significant pulmonary nodules or acute consolidative airspace disease. Hepatobiliary: Normal liver size. No liver mass. Cholecystectomy. No biliary ductal dilatation. Pancreas: Normal, with no mass or duct dilation. Spleen: Normal size. No mass. Adrenals/Urinary Tract: Normal adrenals. No renal stones. No hydronephrosis. No contour deforming renal masses. Normal bladder. Stomach/Bowel: Normal non-distended stomach. Moderate periumbilical ventral abdominal hernia containing an incarcerated distal small bowel loop, proximal to which the small bowel is diffusely mildly dilated and fluid-filled (up to 3.1 cm diameter), and distal to which the small bowel is relatively collapsed, compatible with mechanical distal small bowel obstruction. No thick walled small bowel loops. No pneumatosis. Appendectomy. Moderate diffuse colonic diverticulosis with no large bowel wall thickening or significant pericolonic fat stranding. Vascular/Lymphatic: Atherosclerotic nonaneurysmal abdominal aorta. 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 No pathologically enlarged lymph nodes in the abdomen or pelvis. Reproductive: Fluid and gas mildly distends the uterine cavity. Simple 3.1 cm right adnexal cyst (series 2/image 64). No left adnexal mass. Other: No pneumoperitoneum, ascites or focal fluid collection. Musculoskeletal: No aggressive appearing focal osseous lesions. Mild thoracolumbar spondylosis. IMPRESSION: 1. Mechanical distal small bowel obstruction due to incarcerated distal small bowel loop within a moderate size periumbilical ventral abdominal hernia. No free air. No abscess. No pneumatosis. Surgical consultation advised. 2. Moderate diffuse colonic diverticulosis with no evidence of acute diverticulitis. 3. Fluid and gas mildly distends the uterine cavity, nonspecific. Recommend correlation with pelvic ultrasound on a  short term outpatient basis. 4. Simple 3.1 cm right adnexal cyst. Recommend follow-up US in 6-12 months. Note: This recommendation does not apply to premenarchal patients and to those with increased risk (genetic, family history, elevated tumor markers or other high-risk factors) of ovarian cancer. Reference: JACR 2020 Feb; 17(2):248-254 5. Aortic Atherosclerosis (ICD10-I70.0). Electronically Signed   By: Delbert Phenix M.D.   On: 11/03/2020 13:46   DG Abd Portable 1 View  Result Date: 11/03/2020 CLINICAL DATA:  Enteric catheter placement EXAM: PORTABLE ABDOMEN - 1 VIEW COMPARISON:  11/03/2020 FINDINGS: Frontal view of the lower chest and upper abdomen demonstrates advancement of the enteric catheter, tip and side port coiled over the gastric antrum. Fluid-filled loops of bowel are seen throughout the abdomen and pelvis. Lung bases are clear. IMPRESSION: 1. Enteric catheter tip coiled over the gastric antrum. Electronically Signed   By: Sharlet Salina M.D.   On: 11/03/2020 23:31   DG Abd Portable 1 View  Result Date: 11/03/2020 CLINICAL DATA:  NG tube placement EXAM: PORTABLE ABDOMEN - 1 VIEW COMPARISON:  None. FINDINGS: NG tube tip is in the proximal stomach with the side port at the GE junction. IMPRESSION: NG tube tip in the stomach. Electronically Signed   By: Charlett Nose M.D.   On: 11/03/2020  22:51      Darnell Level 11/04/2020

## 2020-11-04 NOTE — Plan of Care (Signed)
  Problem: Education: Goal: Knowledge of General Education information will improve Description: Including pain rating scale, medication(s)/side effects and non-pharmacologic comfort measures Outcome: Progressing  Blood pressure (!) 101/58, pulse 85, temperature 97.8 F (36.6 C), temperature source Oral, resp. rate 17, SpO2 98 %. A&O X4, denies nausea, pain 0/10,  Educated on intake and output. Educated on telemetry monitoring, oriented to the room and encouraged to call to call with any concerns .

## 2020-11-04 NOTE — Consult Note (Addendum)
Cardiology Consultation:   Patient ID: Erin Mitchell MRN: [id number]; DOB: 1945/10/27  Admit date: 11/03/2020 Date of Consult: 11/04/2020  PCP:  Luetta Nutting, DO   [facility] Providers Cardiologist:  None      Patient Profile:   Erin Mitchell is a 75 y.o. female with a hx of DM, HTN, Agt Orange exposure, CKD III, venous insuff, PAD s/p PTA and toe amputations, who is being seen 11/04/2020 for the preop evaluation of hernia surgery at the request of Dr Velia Meyer.  History of Present Illness:   Erin Mitchell saw Dr Dierdre Forth in [place] for PAD, s/p R PTA. Also had an echo when hospitalized for osteomyelitis, bacteremia in 2021. She developed atrial fibrillation at that time as well. Echo thought to have vegetations on 2 valves, but no change in 3 months so not thought 2nd bacteria. No hx NST or heart cath.   Erin Mitchell is moving here from [place].  They are moving to be closer to their 4 children.  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  She can do some walking with a walker, but if she tries to walk as far as a block, her foot will be extremely painful.  It is not clear if these are claudication symptoms or not.  Although her activity is limited, she does not have any history of chest pain or shortness of breath with ambulation.  She is not having claudication symptoms in her left leg.  She is not having lower extremity edema.  Her abdomen is feeling much better now.  Her hernia was reduced last p.m. by the surgeon.  Since then, she has had diarrhea, but that has slowed down.  She is on clear liquids and tolerating those well.  She was seen by Palliative Care and made a DNR.  However, she is aware that this would be suspended during surgery.   Past Medical History:  Diagnosis Date  . Atrial fibrillation ([place])   . Diabetes ([place])   . Exposure to [employer]   .  Hypertension   . Kidney disease   . Low thyroid stimulating hormone (TSH) level   . Thyroid disease     Past Surgical History:  Procedure Laterality Date  . APPENDECTOMY    . CESAREAN SECTION    . CHOLECYSTECTOMY    . HERNIA REPAIR    . TOE AMPUTATION Right    2nd & 3rd     Home Medications:  Prior to Admission medications   Medication Sig Start Date End Date Taking? Authorizing Provider  clopidogrel (PLAVIX) 75 MG tablet TAKE 1 TABLET BY MOUTH ONCE DAILY Patient taking differently: Take 75 mg by mouth in the morning. 10/12/20  Yes Luetta Nutting, DO  lisinopril (ZESTRIL) 20 MG tablet TAKE 1 TABLET BY MOUTH TWICE DAILY Patient taking differently: Take 20 mg by mouth in the morning and at bedtime. 10/12/20  Yes Luetta Nutting, DO  loperamide (IMODIUM A-D) 2 MG tablet Take 2 mg by mouth 4 (four) times daily as needed for diarrhea or loose stools.   Yes [provider]  metFORMIN (GLUCOPHAGE) 500 MG tablet Take 1 tablet (500 mg total) by mouth 2 (two) times daily with a meal. 10/16/20 04/14/21 Yes Luetta Nutting, DO  metoprolol tartrate (LOPRESSOR) 25 MG tablet TAKE 1 TABLET BY MOUTH TWICE DAILY WITH FOOD Patient taking differently: Take 25 mg by mouth 2 (two) times daily with a meal. 10/12/20  Yes Luetta Nutting, DO  NP THYROID 90 MG tablet Take 90 mg by mouth daily before breakfast.   Yes [provider]  rosuvastatin (CRESTOR) 20 MG tablet TAKE 1 TABLET BY MOUTH ONCE DAILY Patient taking differently: Take 20 mg by mouth at bedtime. 10/12/20  Yes Luetta Nutting, DO  Alcohol Swabs (B-D SINGLE USE SWABS REGULAR) PADS Use as directed. 10/12/20   Luetta Nutting, DO  Blood Glucose Monitoring Suppl (TRUE METRIX METER) w/Device KIT Use as directed to test blood glucose daily 10/12/20   Luetta Nutting, DO  glucose blood (TRUE METRIX BLOOD GLUCOSE TEST) test strip Use as instructed 10/12/20   Luetta Nutting, DO  Loperamide HCl (IMODIUM PO) Take 1 tablet by mouth 2 (two) times daily as  needed. Patient not taking: Reported on 11/03/2020    [provider]  thyroid (ARMOUR) 90 MG tablet Take 90 mg by mouth daily before breakfast. Patient not taking: Reported on 11/03/2020    [provider]  TRUEplus Lancets 33G MISC Use as directed daily. 10/12/20   Luetta Nutting, DO    Inpatient Medications: Scheduled Meds: . insulin aspart  0-6 Units Subcutaneous Q6H   Continuous Infusions: . lactated ringers 125 mL/hr at 11/04/20 1023   PRN Meds: acetaminophen **OR** acetaminophen, fentaNYL (SUBLIMAZE) injection, naLOXone (NARCAN)  injection, ondansetron (ZOFRAN) IV  Allergies:   No Known Allergies  Social History:   Social History   Socioeconomic History  . Marital status: Married    Spouse name: Not on file  . Number of children: Not on file  . Years of education: Not on file  . Highest education level: Not on file  Occupational History  . Occupation: Retired  Tobacco Use  . Smoking status: Former Smoker    Packs/day: 0.25    Years: 1.00    Pack years: 0.25    Types: Cigarettes    Quit date: 06/06/1970    Years since quitting: 50.4  . Smokeless tobacco: Never Used  Vaping Use  . Vaping Use: Never used  Substance and Sexual Activity  . Alcohol use: Never  . Drug use: Never  . Sexual activity: Not on file  Other Topics Concern  . Not on file  Social History Narrative  . Not on file   Social Determinants of Health   Financial Resource Strain: Not on file  Food Insecurity: Not on file  Transportation Needs: Not on file  Physical Activity: Not on file  Stress: Not on file  Social Connections: Not on file  Intimate Partner Violence: Not on file    Family History:   Family History  Problem Relation Age of Onset  . Hypertension Father   . Diabetes Brother   . Emphysema Mother     Family Status  Relation Name Status  . Father  Deceased  . Brother  Alive  . Mother  Deceased   ROS:  Please see the history of present illness.  All  other ROS reviewed and negative.     Physical Exam/Data:   Vitals:   11/04/20 0815 11/04/20 0851 11/04/20 0945 11/04/20 1427  BP: 114/72 113/64 (!) 101/58 122/62  Pulse: 86 85 85 91  Resp: _0 Temp:  97.8 F (36.6 C) 97.8 F (36.6 C) 98.4 F (36.9 C)  TempSrc:  Oral Oral Oral  SpO2: 98% 99% 98% 100%    Intake/Output Summary (Last 24 hours) at 11/04/2020 1523 Last data filed at 11/04/2020 0851 Gross per 24 hour  Intake --  Output 50 ml  Net -50 ml   Last 3 Weights 11/03/2020 10/08/2020  Weight (lbs) 148 lb 153 lb 4.8 oz  Weight (kg) 67.132 kg 69.536 kg     There is no height or weight on file to calculate BMI.  General:  Well nourished, well developed, in no acute distress HEENT: normal Lymph: no adenopathy Neck: no JVD Endocrine:  No thryomegaly Vascular: R carotid bruit; 3/4 extremity pulses 2+, R pedal pulses weak and R fem pulse weak as well. +L fem bruit Cardiac:  normal S1, S2; RRR; soft murmur  Lungs:  clear to auscultation bilaterally, no wheezing, rhonchi or rales  Abd: soft, slightly tender, no hepatomegaly  Ext: no edema Musculoskeletal:  No deformities, BUE and BLE strength normal and equal Skin: warm and dry  Neuro:  CNs 2-12 intact, no focal abnormalities noted Psych:  Normal affect   EKG:  The EKG was personally reviewed and demonstrates:  SR, HR 88, vent bigeminy seen Telemetry:  Telemetry was personally reviewed and demonstrates:  SR_0   On amiodarone normal sinus 20 heart rate at 84 right now  Relevant CV Studies: None here  Laboratory Data:  High Sensitivity Troponin:  No results for input(s): TROPONINIHS in the last 720 hours.   Chemistry Recent Labs  Lab 11/03/20 1139 11/03/20 1820 11/04/20 0454  NA 139 135 136  K 4.6 4.4 4.2  CL 107 102 102  CO2 21 19* 17*  GLUCOSE 150* 91 140*  BUN 27* 29* 39*  CREATININE 0.88 1.25* 2.01*  CALCIUM 9.3 9.3 8.9  GFRNONAA  --  46* 26*  ANIONGAP  --  14 17*    Recent Labs  Lab  11/03/20 1139 11/03/20 1820 11/04/20 0454  PROT 6.5 7.3 6.3*  ALBUMIN  --  4.0 3.5  AST _1 ALT _2 ALKPHOS  --  75 71  BILITOT 0.5 0.8 1.1   Hematology Recent Labs  Lab 11/03/20 1139 11/03/20 1820 11/04/20 0454  WBC 9.7 13.7* 16.0*  RBC 4.50 4.62 4.07  HGB 12.4 13.4 11.8*  HCT 39.7 40.4 35.9*  MCV 88.2 87.4 88.2  MCH 27.6 29.0 29.0  MCHC 31.2* 33.2 32.9  RDW 12.8 13.0 13.0  PLT 314 321 300    Lab Results  Component Value Date   TSH 2.65 10/08/2020   Lab Results  Component Value Date   HGBA1C 6.8 (H) 10/08/2020   Lab Results  Component Value Date   CHOL 126 10/08/2020   HDL 49 (L) 10/08/2020   LDLCALC 56 10/08/2020   TRIG 125 10/08/2020   CHOLHDL 2.6 10/08/2020    Radiology/Studies:  CT ABDOMEN PELVIS WO CONTRAST  Result Date: 11/03/2020 CLINICAL DATA:  Left upper quadrant abdominal pain and diarrhea for 9 days. Episode of bloody stools. Weight loss of 8-9 pounds. Prior appendectomy and cholecystectomy. History of diverticulitis. EXAM: CT ABDOMEN AND PELVIS WITHOUT CONTRAST TECHNIQUE: Multidetector CT imaging of the abdomen and pelvis was performed following the standard protocol without IV contrast. COMPARISON:  None. FINDINGS: Lower chest: No significant pulmonary nodules or acute consolidative airspace disease. Hepatobiliary: Normal liver size. No liver mass. Cholecystectomy. No biliary ductal dilatation. Pancreas: Normal, with no mass or duct dilation. Spleen: Normal size. No mass. Adrenals/Urinary Tract: Normal adrenals. No renal stones. No hydronephrosis. No contour deforming renal masses. Normal bladder. Stomach/Bowel: Normal non-distended stomach. Moderate periumbilical ventral abdominal hernia containing an incarcerated distal small bowel loop, proximal to which the small bowel is diffusely mildly dilated and  fluid-filled (up to 3.1 cm diameter), and distal to which the small bowel is relatively collapsed, compatible with mechanical distal small  bowel obstruction. No thick walled small bowel loops. No pneumatosis. Appendectomy. Moderate diffuse colonic diverticulosis with no large bowel wall thickening or significant pericolonic fat stranding. Vascular/Lymphatic: Atherosclerotic nonaneurysmal abdominal aorta. 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 No pathologically enlarged lymph nodes in the abdomen or pelvis. Reproductive: Fluid and gas mildly distends the uterine cavity. Simple 3.1 cm right adnexal cyst (series 2/image 64). No left adnexal mass. Other: No pneumoperitoneum, ascites or focal fluid collection. Musculoskeletal: No aggressive appearing focal osseous lesions. Mild thoracolumbar spondylosis. IMPRESSION: 1. Mechanical distal small bowel obstruction due to incarcerated distal small bowel loop within a moderate size periumbilical ventral abdominal hernia. No free air. No abscess. No pneumatosis. Surgical consultation advised. 2. Moderate diffuse colonic diverticulosis with no evidence of acute diverticulitis. 3. Fluid and gas mildly distends the uterine cavity, nonspecific. Recommend correlation with pelvic ultrasound on a short term outpatient basis. 4. Simple 3.1 cm right adnexal cyst. Recommend follow-up US in 6-12 months. Note: This recommendation does not apply to premenarchal patients and to those with increased risk (genetic, family history, elevated tumor markers or other high-risk factors) of ovarian cancer. Reference: JACR 2020 Feb; 17(2):248-254 5. Aortic Atherosclerosis (ICD10-I70.0). Electronically Signed   By: Ilona Sorrel M.D.   On: 11/03/2020 13:46   DG Abd Portable 1V  Result Date: 11/04/2020 CLINICAL DATA:  Abdominal pain EXAM: PORTABLE ABDOMEN - 1 VIEW COMPARISON:  11/03/2020 FINDINGS: Enteric catheter remains positioned within the distal stomach. Enteric contrast has progressed and is now present throughout the colon.  Single prominent loop of bowel within the right hemiabdomen, which may represent an air-filled cecum. Otherwise, no evidence of bowel obstruction. IMPRESSION: 1. Enteric tube remains positioned within the distal stomach. 2. Single prominent loop of bowel within the right hemiabdomen, which may represent an air-filled cecum. Otherwise, no evidence of bowel obstruction. Oral contrast has progressed throughout the colon. Electronically Signed   By: Davina Poke D.O.   On: 11/04/2020 09:21   DG Abd Portable 1 View  Result Date: 11/03/2020 CLINICAL DATA:  Enteric catheter placement EXAM: PORTABLE ABDOMEN - 1 VIEW COMPARISON:  11/03/2020 FINDINGS: Frontal view of the lower chest and upper abdomen demonstrates advancement of the enteric catheter, tip and side port coiled over the gastric antrum. Fluid-filled loops of bowel are seen throughout the abdomen and pelvis. Lung bases are clear. IMPRESSION: 1. Enteric catheter tip coiled over the gastric antrum. Electronically Signed   By: Randa Ngo M.D.   On: 11/03/2020 23:31   DG Abd Portable 1 View  Result Date: 11/03/2020 CLINICAL DATA:  NG tube placement EXAM: PORTABLE ABDOMEN - 1 VIEW COMPARISON:  None. FINDINGS: NG tube tip is in the proximal stomach with the side port at the GE junction. IMPRESSION: NG tube tip in the stomach. Electronically Signed   By: Rolm Baptise M.D.   On: 11/03/2020 22:51     Assessment and Plan:   1. Preop cardiovascular exam -She is unable to do 3 METS, but has no ischemic symptoms - However, her DASI score gives her a functional capacity in METS of 3.63 - History of atrial fibrillation, currently maintaining sinus rhythm -RCRI is 1, for a 0.9% perioperative risk of major cardiac events -  will get an echo to check LV function - Have sent for records  from Dr Dierdre Forth, her cardiologist in [place].  Those are pending, but have been sent - If her EF is normal, no additional evaluation is needed prior to  surgery -Because of her history of atrial fibrillation and PAD, she is at increased risk for the surgery but it is not prohibitive  2.  History of atrial fibrillation - She is not experiencing palpitations.  She does not have any history of presyncope or syncope. - Atrial fibrillation occurred in 2021, in the setting of significant illness from osteomyelitis requiring toe amputations - Continue beta-blocker as blood pressure will allow - because of her PAD, she is on Plavix. - Her CHA2DS2-VASc score is elevated, anticoagulation is indicated  3.  PAD: - Awaiting records from her cardiologist office - She is currently not experiencing any symptoms that are clearly from claudication - Follow-up as an outpatient, may need ABIs  4.  Incarcerated hernia - The hernia was reduced by the surgeon, she feels much better - Dr. Gala Lewandowsky note mentions performing a lap chole at some point, but not necessarily this admission  5.  Acute renal insufficiency - hydration ongoing, per IM    Risk Assessment/Risk Scores:    CHA2DS2-VASc Score = 5      :[id number] indicates a 7.2% annual risk of stroke. The patient's score is based upon: CHF History: No HTN History: Yes Diabetes History: Yes Stroke History: No Vascular Disease History: Yes Age Score: 1 Gender Score: 1     For questions or updates, please contact [place] Please consult [email or web address] for contact info under   Signed, Rosaria Ferries, PA-C  11/04/2020 3:23 PM As above, patient seen and examined.  Briefly she is a 75 year old female with past medical history of diabetes mellitus, hypertension, chronic stage III kidney disease, peripheral vascular disease for preoperative evaluation prior to ventral hernia repair.  Previously cared for in [place].  She apparently had a bout of atrial fibrillation in the setting of osteomyelitis.  She states she was not anticoagulated and had outpatient Holter's that were unremarkable.  She  also had an echocardiogram that suggested possible small vegetations but there was no change on follow-up.  Recently moved to this area.  Admitted with SBO with incarcerated ventral hernia.  This was subsequently reduced.  She may require surgical intervention and cardiology asked to evaluate.  Note she has limited mobility because of pain in her foot.  However she can bring groceries in from the car without having chest pain or dyspnea.  She can ambulate up to 2 blocks by report.  She denies orthopnea, PND, pedal edema, syncope, palpitations. Electrocardiogram shows sinus rhythm with PVCs but no ST changes.  1 preoperative evaluation prior to ventral hernia repair-electrocardiogram shows no ST changes.  I will arrange an echocardiogram to assess LV function.  Otherwise she can do reasonable activities with no symptoms including no chest pain or dyspnea.  If LV function normal she may proceed without further cardiac evaluation.  We will obtain all records from her previous cardiologist.  2 history of atrial fibrillation-apparently this occurred in the setting of osteomyelitis.  She has had no recurrences and is not anticoagulated.  3 peripheral vascular disease-patient is not having claudication.  Continue medical therapy.  Resume Plavix and statin at discharge.  4 acute on chronic stage IIIa kidney disease-renal function is worse.  Likely from dehydration.  Agree with holding ACE inhibitor.  Follow blood pressure and adjust medications as needed.  5 incarcerated hernia-status postreduction.  Plans per  general surgery.  Kirk Ruths, MD

## 2020-11-04 NOTE — Plan of Care (Signed)

## 2020-11-04 NOTE — ED Notes (Addendum)
Pt has had multiple (5+) loose stools throughout the night requiring cleaning and linen changes. Sample collected and is at bedside. Barrier cream applied.

## 2020-11-04 NOTE — ED Notes (Signed)
Pt has had multiple loose, watery stools. RN made aware.

## 2020-11-04 NOTE — Consult Note (Signed)
Consultation Note Date: 11/04/2020   Patient Name: Erin Mitchell  DOB: Jul 18, 1945  MRN: [id number]  Age / Sex: 75 y.o., female  PCP: Everrett Coombe, DO Referring Physician: Angie Fava, DO  Reason for Consultation: Establishing goals of care  HPI/Patient Profile: 76 y.o. female  with past medical history of a-fib, hypothryoid, agent orange exposure, ventral hernia, PAD s/p toe amputations, HTN admitted on 11/03/2020 with diarrhea. Workup revealed incarcerated hernia, and small bowel obstruction. Incarceration was reduced manually in the ED. NG tube is now out. Palliative medicine consulted to "explore code status".     Clinical Assessment and Goals of Care: Chart reviewed. Patient lives independently at home. Her spouse Erin Mitchell is at bedside.  On my arrival she has just had NG tube out, she is feeling better. Introduced Palliative medicine to patient.  She had questions regarding DNR.  We discussed the role and meaning of a Do Not Resuscitate order.  Erin Mitchell was clear that if she were to sustain cardiac or respiratory arrest she would not want to be resuscitated. She feels that in her 78's that going through full resuscitative efforts would not be beneficial to her. She shares about her sister who is a Engineer, civil (consulting) and had a large tattoo on her chest that states DNR. She understands the order will be suspended during her surgery and is in agreement with this plan.  We discussed the purpose of the Gold DNR form that she will receive at discharge.   Primary Decision Maker PATIENT    SUMMARY OF RECOMMENDATIONS -Do Not Resuscitate order entered- patient understands order will be suspended when she has surgery and will resume after surgery    Code Status/Advance Care Planning:  DNR  Prognosis:    Unable to determine  Discharge Planning: Home  Primary Diagnoses: Present on Admission: . SBO (small bowel  obstruction) (HCC) . Incarcerated ventral hernia . Nausea . Abdominal pain . AKI (acute kidney injury) (HCC) . Leukocytosis . Acute cystitis . PAF (paroxysmal atrial fibrillation) (HCC) . Acquired hypothyroidism . Hypertension    No Known Allergies  Physical Exam Vitals and nursing note reviewed.  Constitutional:      Appearance: Normal appearance.  Skin:    General: Skin is warm and dry.  Neurological:     General: No focal deficit present.     Mental Status: She is alert and oriented to person, place, and time.  Psychiatric:        Mood and Affect: Mood normal.        Behavior: Behavior normal.        Thought Content: Thought content normal.        Judgment: Judgment normal.     Vital Signs: BP (!) 101/58 (BP Location: Left Arm)   Pulse 85   Temp 97.8 F (36.6 C) (Oral)   Resp 17   SpO2 98%  Pain Scale: 0-10   Pain Score: 0-No pain   SpO2: SpO2: 98 % O2 Device:SpO2: 98 % O2 Flow Rate: .   IO: Intake/output summary:  Intake/Output Summary (Last 24 hours) at 11/04/2020 1200 Last data filed at 11/04/2020 0851 Gross per 24 hour  Intake --  Output 50 ml  Net -50 ml    Thank you for this consult. Palliative medicine will continue to follow and assist as needed.   Time In: 1003 Time Out: 1104 Time Total: 61 minutes Greater than 50%  of this time was spent counseling and coordinating care related to the above assessment and plan.  Signed by: Ocie Bob, AGNP-C Palliative Medicine    Please contact Palliative Medicine Team phone at [phone] for questions and concerns.  For individual provider: See Loretha Stapler

## 2020-11-05 ENCOUNTER — Inpatient Hospital Stay (HOSPITAL_COMMUNITY): Payer: Medicare HMO

## 2020-11-05 DIAGNOSIS — I4891 Unspecified atrial fibrillation: Secondary | ICD-10-CM | POA: Diagnosis not present

## 2020-11-05 DIAGNOSIS — K56609 Unspecified intestinal obstruction, unspecified as to partial versus complete obstruction: Secondary | ICD-10-CM | POA: Diagnosis not present

## 2020-11-05 DIAGNOSIS — N179 Acute kidney failure, unspecified: Secondary | ICD-10-CM | POA: Diagnosis not present

## 2020-11-05 LAB — GASTROINTESTINAL PANEL BY PCR, STOOL (REPLACES STOOL CULTURE)

## 2020-11-05 LAB — ECHOCARDIOGRAM COMPLETE
Area-P 1/2: 3.63 cm2
Height: 64 in
S' Lateral: 2.6 cm
Weight: 2458.57 oz

## 2020-11-05 LAB — CBC
HCT: 34.2 % — ABNORMAL LOW (ref 36.0–46.0)
Hemoglobin: 11.4 g/dL — ABNORMAL LOW (ref 12.0–15.0)
MCH: 28.9 pg (ref 26.0–34.0)
MCHC: 33.3 g/dL (ref 30.0–36.0)
MCV: 86.6 fL (ref 80.0–100.0)
Platelets: 269 10*3/uL (ref 150–400)
RBC: 3.95 MIL/uL (ref 3.87–5.11)
RDW: 13 % (ref 11.5–15.5)
WBC: 14.7 10*3/uL — ABNORMAL HIGH (ref 4.0–10.5)
nRBC: 0 % (ref 0.0–0.2)

## 2020-11-05 LAB — GLUCOSE, CAPILLARY
Glucose-Capillary: 112 mg/dL — ABNORMAL HIGH (ref 70–99)
Glucose-Capillary: 119 mg/dL — ABNORMAL HIGH (ref 70–99)
Glucose-Capillary: 179 mg/dL — ABNORMAL HIGH (ref 70–99)
Glucose-Capillary: 197 mg/dL — ABNORMAL HIGH (ref 70–99)
Glucose-Capillary: 99 mg/dL (ref 70–99)

## 2020-11-05 LAB — BASIC METABOLIC PANEL
Anion gap: 10 (ref 5–15)
BUN: 36 mg/dL — ABNORMAL HIGH (ref 8–23)
CO2: 23 mmol/L (ref 22–32)
Calcium: 8.3 mg/dL — ABNORMAL LOW (ref 8.9–10.3)
Chloride: 102 mmol/L (ref 98–111)
Creatinine, Ser: 1.35 mg/dL — ABNORMAL HIGH (ref 0.44–1.00)
GFR, Estimated: 42 mL/min — ABNORMAL LOW (ref >60)
Glucose, Bld: 113 mg/dL — ABNORMAL HIGH (ref 70–99)
Potassium: 3.6 mmol/L (ref 3.5–5.1)
Sodium: 135 mmol/L (ref 135–145)

## 2020-11-05 MED ORDER — LOPERAMIDE HCL 2 MG PO CAPS
2.0000 mg | ORAL_CAPSULE | Freq: Two times a day (BID) | ORAL | Status: DC | PRN
  Administered 2020-11-05: 2 mg via ORAL
  Filled 2020-11-05: qty 1

## 2020-11-05 MED ORDER — ROSUVASTATIN CALCIUM 20 MG PO TABS
20.0000 mg | ORAL_TABLET | Freq: Every day | ORAL | Status: DC
  Administered 2020-11-05 – 2020-11-08 (×4): 20 mg via ORAL
  Filled 2020-11-05 (×4): qty 1

## 2020-11-05 MED ORDER — THYROID 60 MG PO TABS
90.0000 mg | ORAL_TABLET | Freq: Every day | ORAL | Status: DC
  Administered 2020-11-06 – 2020-11-09 (×4): 90 mg via ORAL
  Filled 2020-11-05 (×4): qty 1

## 2020-11-05 MED ORDER — LACTATED RINGERS IV SOLN: INTRAVENOUS | Status: DC

## 2020-11-05 NOTE — Progress Notes (Addendum)
PROGRESS NOTE   Erin Mitchell  BSJ:[id number]    DOB: 10-03-1945    DOA: 11/03/2020  PCP: Everrett Coombe, DO   I have briefly reviewed patients previous medical records in [facility].  Chief Complaint  Patient presents with  . Abdominal Pain    Brief Narrative:   75 y.o. female with medical history significant for chronic/recurrent ventral abdominal hernia, type 2 diabetes mellitus, acquired hypothyroidism, essential hypertension, paroxysmal atrial fibrillation not on chronic formal anticoagulation, who is admitted to [facility] on 11/03/2020 with small bowel obstruction in the setting of incarcerated ventral abdominal hernia after presenting from home to [facility] ED complaining of abdominal pain.   Improved after supportive management but as discussed with CCS today, may consider surgical fixation this admission.  Cardiology consulted for preop clearance.  Decision regarding surgery still undetermined, ideally would prefer to do it electively as outpatient pending resolution of diarrheal illness but may require urgent surgical intervention if hernia becomes nonreducible again.   Assessment & Plan:  Principal Problem:   SBO (small bowel obstruction) (HCC) Active Problems:   PAF (paroxysmal atrial fibrillation) (HCC)   Acquired hypothyroidism   Incarcerated ventral hernia   Nausea   Abdominal pain   AKI (acute kidney injury) (HCC)   Leukocytosis   Acute cystitis   Hypertension   Diabetes (HCC)   Incarcerated ventral incisional hernia with small bowel obstruction: General surgery consulted.  Treated supportively with NPO, NG tube to low wall suction, IV fluids.  Clinically improved.  Hernia mechanically reduced on night of admission by general surgery.  SBO resolved.  Defer to surgeons regarding initiation of diet.  Plavix on hold in case urgent surgery becomes necessary.  Cardiology input appreciated.  Had cleared for surgery pending normal EF.  2D echo 6/2: LVEF 65-70%.  As  discussed with Dr. Gerrit Friends, CCS this morning,  ideally would prefer to do it electively as outpatient pending resolution of diarrheal illness but may require urgent surgical intervention if hernia becomes nonreducible again- note hernia palpable, mildly tender and not fully reducible again.  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  Diarrhea preceded her SBO by about 9 to 10 days.  No recent antibiotic exposure.  C. difficile and GI panel PCR negative.  Diarrhea spontaneously improving.  May consider Imodium as needed if worsens again.  Acute kidney injury: Likely multifactorial due to prerenal from GI losses, poor oral intake along with lisinopril use.  Intermittent soft blood pressures and ATN also possible.  Presented with creatinine of 0.88 which rapidly increased to 2.01.  Improving with improving creatinine/1.35 and urine output.  Continue IV fluids for additional day and trend BMP.  Anion gap metabolic acidosis: Due to GI losses.  Resolved.  Dilutional anemia: No blood loss.  Stable.  Asymptomatic bacteriuria: Patient denies UTI symptoms, has had UTI in the past.  Discontinued empirically started ceftriaxone.  Type II DM with proteinuria/nephropathy: Holding metformin.  SSI for now.  Reasonable inpatient control.  Hold lisinopril due to AKI.  Acquired hypothyroidism: Resumed home thyroid supplements  Essential hypertension: Holding lisinopril due to AKI and Lopressor due to soft or normal blood pressures.  Resume Lopressor when able.  Paroxysmal A. fib: CHA2DS2-VASc score of 3.  As per detailed discussion with patient, she had a single episode of A. fib post vascular procedure in [place] last year,  extensively evaluated by cardiology including Holter monitoring which did not show any further A. fib and even had a TEE due to concerns for  endocarditis which was ruled out.  Patient likely not on anticoagulation due to single postprocedure A. fib episode.  Metoprolol discussion as noted above.  Plavix currently on hold.  Cardiology input appreciated.  2D echo with normal EF.  Cardiology attempting to review records from outside cardiologist.  Incidental CT abdomen findings 11/03/2020, outpatient follow-up Moderate diffuse colonic diverticulosis. Fluid and gas mildly distends the uterine cavity, nonspecific.  Pelvic ultrasound as outpatient Simple 3.1 cm right adnexal cyst, recommend follow-up ultrasound in 6 to 12 months.   Body mass index is 26.38 kg/m.    DVT prophylaxis: SCDs Start: 11/03/20 2017     Code Status: DNR Family Communication: None at bedside.  Patient declined my offer to update spouse regarding her condition.  She stated that she has been informing him. Disposition:  Status is: Inpatient  Remains inpatient appropriate because:Inpatient level of care appropriate due to severity of illness   Dispo: The patient is from: Home              Anticipated d/c is to: Home              Patient currently is not medically stable to d/c.          Consultants:   General surgery Cardiology  Procedures:   NG tube- discontinued 6/1  Antimicrobials:    Anti-infectives (From admission, onward)   Start     Dose/Rate Route Frequency Ordered Stop   11/05/20 0400  cefTRIAXone (ROCEPHIN) 1 g in sodium chloride 0.9 % 100 mL IVPB  Status:  Discontinued        1 g 200 mL/hr over 30 Minutes Intravenous Every 24 hours 11/04/20 0402 11/04/20 1218   11/04/20 0445  cefTRIAXone (ROCEPHIN) 1 g in sodium chloride 0.9 % 100 mL IVPB        1 g 200 mL/hr over 30 Minutes Intravenous  Once 11/04/20 0439 11/04/20 0550        Subjective:  Overall feels much better.  No BM overnight.  2-3 small volume loose BMs this morning.  Has some recurrence of lower abdominal pain and able to feel her hernia.  No nausea or vomiting.   Tolerated liquid diet.  Ambulating by herself in the room.  Objective:   Vitals:   11/04/20 1935 11/05/20 0534 11/05/20 0535 11/05/20 1300  BP: 129/65 (!) 104/54  (!) 125/53  Pulse: 84 81  86  Resp: Temp: 98.4 F (36.9 C) 99 F (37.2 C)  99.3 F (37.4 C)  TempSrc: Oral Oral  Oral  SpO2: 100% 97%  97%  Weight:   69.7 kg     General exam: Elderly female, moderately built and nourished, seen ambulating independently by her bedside.  Looks improved compared to yesterday. Respiratory system: Clear to auscultation.  No increased work of breathing. Cardiovascular system: S1 and S2 heard, RRR.  No JVD, murmurs or pedal edema.  Telemetry personally reviewed: Sinus rhythm. Gastrointestinal system: Abdomen is nondistended, soft and nontender. No organomegaly or masses felt. Normal bowel sounds heard.  Subumbilical midline laparotomy scar.  Small palpable minimally tender ventral hernia, not reducible. Central nervous system: Alert and oriented. No focal neurological deficits. Extremities: Symmetric 5 x 5 power. Skin: No rashes, lesions or ulcers Psychiatry: Judgement and insight appear normal. Mood & affect appropriate.     Data  Reviewed:   I have personally reviewed following labs and imaging studies   CBC: Recent Labs  Lab 11/03/20 1139 11/03/20 1820 11/04/20 0454 11/05/20 0044  WBC 9.7 13.7* 16.0* 14.7*  NEUTROABS 6,809 9.6* 12.9*  --   HGB 12.4 13.4 11.8* 11.4*  HCT 39.7 40.4 35.9* 34.2*  MCV 88.2 87.4 88.2 86.6  PLT 314 321 300 269    Basic Metabolic Panel: Recent Labs  Lab 11/03/20 1820 11/04/20 0454 11/05/20 0044  NA 135 136 135  K 4.4 4.2 3.6  CL 102 102 102  CO2 19* 17* 23  GLUCOSE 91 140* 113*  BUN 29* 39* 36*  CREATININE 1.25* 2.01* 1.35*  CALCIUM 9.3 8.9 8.3*  MG  --  1.7  --     Liver Function Tests: Recent Labs  Lab 11/03/20 1139 11/03/20 1820 11/04/20 0454  AST ALT ALKPHOS  --  75 71  BILITOT 0.5 0.8 1.1   PROT 6.5 7.3 6.3*  ALBUMIN  --  4.0 3.5    CBG: Recent Labs  Lab 11/05/20 0056 11/05/20 0453 11/05/20 1146  GLUCAP 99 112* 197*    Microbiology Studies:   Recent Results (from the past 240 hour(s))  Resp Panel by RT-PCR (Flu A&B, Covid) Nasopharyngeal Swab     Status: None   Collection Time: 11/03/20  8:48 PM   Specimen: Nasopharyngeal Swab; Nasopharyngeal(NP) swabs in vial transport medium  Result Value Ref Range Status   SARS Coronavirus 2 by RT PCR NEGATIVE NEGATIVE Final    Comment: (NOTE) SARS-CoV-2 target nucleic acids are NOT DETECTED.  The SARS-CoV-2 RNA is generally detectable in upper respiratory specimens during the acute phase of infection. The lowest concentration of SARS-CoV-2 viral copies this assay can detect is 138 copies/mL. A negative result does not preclude SARS-Cov-2 infection and should not be used as the sole basis for treatment or other patient management decisions. A negative result may occur with  improper specimen collection/handling, submission of specimen other than nasopharyngeal swab, presence of viral mutation(s) within the areas targeted by this assay, and inadequate number of viral copies(<138 copies/mL). A negative result must be combined with clinical observations, patient history, and epidemiological information. The expected result is Negative.  Fact Sheet for Patients:  BloggerCourse.com  Fact Sheet for Healthcare Providers:  SeriousBroker.it  This test is no t yet approved or cleared by the [place] FDA and  has been authorized for detection and/or diagnosis of SARS-CoV-2 by FDA under an Emergency Use Authorization (EUA). This EUA will remain  in effect (meaning this test can be used) for the duration of the COVID-19 declaration under Section 564(b)(1) of the Act, 21 U.S.C.section 360bbb-3(b)(1), unless the authorization is terminated  or revoked sooner.        Influenza A by PCR NEGATIVE NEGATIVE Final   Influenza B by PCR NEGATIVE NEGATIVE Final    Comment: (NOTE) The Xpert Xpress SARS-CoV-2/FLU/RSV plus assay is intended as an aid in the diagnosis of influenza from Nasopharyngeal swab specimens and should not be used as a sole basis for treatment. Nasal washings and aspirates are unacceptable for Xpert Xpress SARS-CoV-2/FLU/RSV testing.  Fact Sheet for Patients: BloggerCourse.com  Fact Sheet for Healthcare Providers: SeriousBroker.it  This test is not yet approved or cleared by the [place] FDA and has been authorized for detection and/or diagnosis of SARS-CoV-2 by FDA under an Emergency Use Authorization (EUA). This EUA will remain in effect (meaning this test can be  used) for the duration of the COVID-19 declaration under Section 564(b)(1) of the Act, 21 U.S.C. section 360bbb-3(b)(1), unless the authorization is terminated or revoked.  Performed at [facility], [address] [place], [place] [address]   Culture, blood (Routine X 2) w Reflex to ID Panel     Status: None (Preliminary result)   Collection Time: 11/04/20  4:40 AM   Specimen: BLOOD LEFT FOREARM  Result Value Ref Range Status   Specimen Description BLOOD LEFT FOREARM  Final   Special Requests   Final    BOTTLES DRAWN AEROBIC AND ANAEROBIC Blood Culture adequate volume   Culture   Final    NO GROWTH 1 DAY Performed at [facility], [address] [place], [place] [address]    Report Status PENDING  Incomplete  Culture, blood (Routine X 2) w Reflex to ID Panel     Status: None (Preliminary result)   Collection Time: 11/04/20  4:45 AM   Specimen: BLOOD RIGHT WRIST  Result Value Ref Range Status   Specimen Description BLOOD RIGHT WRIST  Final   Special Requests   Final    BOTTLES DRAWN AEROBIC AND ANAEROBIC Blood Culture results may not be optimal due to an excessive volume of blood received in  culture bottles   Culture   Final    NO GROWTH 1 DAY Performed at [facility], [address] [place], [place] [address]    Report Status PENDING  Incomplete  C Difficile Quick Screen w PCR reflex     Status: None   Collection Time: 11/04/20  7:33 AM   Specimen: STOOL  Result Value Ref Range Status   C Diff antigen NEGATIVE NEGATIVE Final   C Diff toxin NEGATIVE NEGATIVE Final   C Diff interpretation No C. difficile detected.  Final    Comment: Performed at [facility], [address] [place], [place] [address]  Gastrointestinal Panel by PCR , Stool     Status: None   Collection Time: 11/04/20  7:33 AM   Specimen: Stool  Result Value Ref Range Status   Campylobacter species NOT DETECTED NOT DETECTED Final   Plesimonas shigelloides NOT DETECTED NOT DETECTED Final   Salmonella species NOT DETECTED NOT DETECTED Final   Yersinia enterocolitica NOT DETECTED NOT DETECTED Final   Vibrio species NOT DETECTED NOT DETECTED Final   Vibrio cholerae NOT DETECTED NOT DETECTED Final   Enteroaggregative E coli (EAEC) NOT DETECTED NOT DETECTED Final   Enteropathogenic E coli (EPEC) NOT DETECTED NOT DETECTED Final   Enterotoxigenic E coli (ETEC) NOT DETECTED NOT DETECTED Final   Shiga like toxin producing E coli (STEC) NOT DETECTED NOT DETECTED Final   Shigella/Enteroinvasive E coli (EIEC) NOT DETECTED NOT DETECTED Final   Cryptosporidium NOT DETECTED NOT DETECTED Final   Cyclospora cayetanensis NOT DETECTED NOT DETECTED Final   Entamoeba histolytica NOT DETECTED NOT DETECTED Final   Giardia lamblia NOT DETECTED NOT DETECTED Final   Adenovirus F40/41 NOT DETECTED NOT DETECTED Final   Astrovirus NOT DETECTED NOT DETECTED Final   Norovirus GI/GII NOT DETECTED NOT DETECTED Final   Rotavirus A NOT DETECTED NOT DETECTED Final   Sapovirus (I, II, IV, and V) NOT DETECTED NOT DETECTED Final    Comment: Performed at [facility], [address] [place], [place] [address]      Radiology Studies:  DG Abd Portable 1V  Result Date: 11/04/2020 CLINICAL DATA:  Abdominal pain EXAM: PORTABLE ABDOMEN -  1 VIEW COMPARISON:  11/03/2020 FINDINGS: Enteric catheter remains positioned within the distal stomach. Enteric contrast has progressed and is now present throughout the colon. Single prominent loop of bowel within the right hemiabdomen, which may represent an air-filled cecum. Otherwise, no evidence of bowel obstruction. IMPRESSION: 1. Enteric tube remains positioned within the distal stomach. 2. Single prominent loop of bowel within the right hemiabdomen, which may represent an air-filled cecum. Otherwise, no evidence of bowel obstruction. Oral contrast has progressed throughout the colon. Electronically Signed   By: Duanne Guess D.O.   On: 11/04/2020 09:21   DG Abd Portable 1 View  Result Date: 11/03/2020 CLINICAL DATA:  Enteric catheter placement EXAM: PORTABLE ABDOMEN - 1 VIEW COMPARISON:  11/03/2020 FINDINGS: Frontal view of the lower chest and upper abdomen demonstrates advancement of the enteric catheter, tip and side port coiled over the gastric antrum. Fluid-filled loops of bowel are seen throughout the abdomen and pelvis. Lung bases are clear. IMPRESSION: 1. Enteric catheter tip coiled over the gastric antrum. Electronically Signed   By: Sharlet Salina M.D.   On: 11/03/2020 23:31   DG Abd Portable 1 View  Result Date: 11/03/2020 CLINICAL DATA:  NG tube placement EXAM: PORTABLE ABDOMEN - 1 VIEW COMPARISON:  None. FINDINGS: NG tube tip is in the proximal stomach with the side port at the GE junction. IMPRESSION: NG tube tip in the stomach. Electronically Signed   By: Charlett Nose M.D.   On: 11/03/2020 22:51   ECHOCARDIOGRAM COMPLETE  Result Date: 11/05/2020    ECHOCARDIOGRAM REPORT   Patient Name:   Erin Mitchell Date of Exam: 11/05/2020 Medical Rec #:  [id number]        Height:       64.0 in Accession #:    [id number]       Weight:       153.7 lb Date of Birth:   [birth year]        BSA:          1.749 m Patient Age:    74 years         BP:           104/54 mmHg Patient Gender: F                HR:           80 bpm. Exam Location:  Inpatient Procedure: 2D Echo, Cardiac Doppler and Color Doppler Indications:    I48.91* Unspeicified atrial fibrillation  History:        Patient has no prior history of Echocardiogram examinations.                 Arrythmias:Atrial Fibrillation; Risk Factors:Hypertension and                 Diabetes.  Sonographer:    Eulah Pont RDCS Referring Phys: [id number] BRIAN S CRENSHAW IMPRESSIONS  1. Left ventricular ejection fraction, by estimation, is 65 to 70%. The left ventricle has normal function. The left ventricle has no regional wall motion abnormalities. There is mild left ventricular hypertrophy of the basal-septal segment. Left ventricular diastolic parameters are consistent with Grade I diastolic dysfunction (impaired relaxation).  2. Right ventricular systolic function is normal. The right ventricular size is normal. There is normal pulmonary artery systolic pressure.  3. The mitral valve is normal in structure. No evidence of mitral valve regurgitation. No evidence of mitral stenosis.  4. The aortic valve is normal in structure. Aortic valve regurgitation is not visualized.  No aortic stenosis is present.  5. The inferior vena cava is normal in size with greater than 50% respiratory variability, suggesting right atrial pressure of 3 mmHg. FINDINGS  Left Ventricle: Left ventricular ejection fraction, by estimation, is 65 to 70%. The left ventricle has normal function. The left ventricle has no regional wall motion abnormalities. The left ventricular internal cavity size was normal in size. There is  mild left ventricular hypertrophy of the basal-septal segment. Left ventricular diastolic parameters are consistent with Grade I diastolic dysfunction (impaired relaxation). Normal left ventricular filling pressure. Right Ventricle: The right  ventricular size is normal. No increase in right ventricular wall thickness. Right ventricular systolic function is normal. There is normal pulmonary artery systolic pressure. The tricuspid regurgitant velocity is 2.39 m/s, and  with an assumed right atrial pressure of 3 mmHg, the estimated right ventricular systolic pressure is 25.8 mmHg. Left Atrium: Left atrial size was normal in size. Right Atrium: Right atrial size was normal in size. Pericardium: There is no evidence of pericardial effusion. Mitral Valve: The mitral valve is normal in structure. Mild mitral annular calcification. No evidence of mitral valve regurgitation. No evidence of mitral valve stenosis. Tricuspid Valve: The tricuspid valve is normal in structure. Tricuspid valve regurgitation is trivial. No evidence of tricuspid stenosis. Aortic Valve: The aortic valve is normal in structure. Aortic valve regurgitation is not visualized. No aortic stenosis is present. Pulmonic Valve: The pulmonic valve was normal in structure. Pulmonic valve regurgitation is not visualized. No evidence of pulmonic stenosis. Aorta: The aortic root is normal in size and structure. Venous: The inferior vena cava is normal in size with greater than 50% respiratory variability, suggesting right atrial pressure of 3 mmHg. IAS/Shunts: There is redundancy of the interatrial septum. No atrial level shunt detected by color flow Doppler.  LEFT VENTRICLE PLAX 2D LVIDd:         4.20 cm  Diastology LVIDs:         2.60 cm  LV e' medial:    4.50 cm/s LV PW:         1.00 cm  LV E/e' medial:  15.1 LV IVS:        1.20 cm  LV e' lateral:   7.43 cm/s LVOT diam:     2.00 cm  LV E/e' lateral: 9.1 LV SV:         86 LV SV Index:   49 LVOT Area:     3.14 cm  RIGHT VENTRICLE RV S prime:     12.40 cm/s TAPSE (M-mode): 2.6 cm LEFT ATRIUM             Index       RIGHT ATRIUM           Index LA diam:        3.10 cm 1.77 cm/m  RA Area:     11.50 cm LA Vol (A2C):   45.3 ml 25.90 ml/m RA Volume:    23.60 ml  13.49 ml/m LA Vol (A4C):   45.3 ml 25.90 ml/m LA Biplane Vol: 46.2 ml 26.41 ml/m  AORTIC VALVE LVOT Vmax:   137.00 cm/s LVOT Vmean:  97.500 cm/s LVOT VTI:    0.273 m  AORTA Ao Root diam: 3.20 cm Ao Asc diam:  3.10 cm MITRAL VALVE                TRICUSPID VALVE MV Area (PHT): 3.63 cm     TR Peak grad:   22.8 mmHg  MV Decel Time: 209 msec     TR Vmax:        239.00 cm/s MV E velocity: 67.90 cm/s MV A velocity: 106.00 cm/s  SHUNTS MV E/A ratio:  0.64         Systemic VTI:  0.27 m                             Systemic Diam: 2.00 cm Armanda Magic MD Electronically signed by Armanda Magic MD Signature Date/Time: 11/05/2020/2:09:10 PM    Final      Scheduled Meds:   . insulin aspart  0-6 Units Subcutaneous Q6H    Continuous Infusions:   . lactated ringers 100 mL/hr at 11/05/20 1317     LOS: 2 days     Marcellus Scott, MD, [employer], [facility]. [facility]    To contact the attending provider between 7A-7P or the covering provider during after hours 7P-7A, please log into the web site [email or web address] and access using universal [place] password for that web site. If you do not have the password, please call the hospital operator.  11/05/2020, 3:31 PM

## 2020-11-05 NOTE — Progress Notes (Signed)
  Echocardiogram 2D Echocardiogram has been performed.  Augustine Radar 11/05/2020, 11:59 AM

## 2020-11-05 NOTE — Progress Notes (Signed)
Progress Note  1 Day Post-Op  Subjective: Patient reports some mild generalized abdominal soreness. Diarrhea is improving some. She denies nausea or vomiting. Primary attending reported feeling hernia more "out" today.   Objective: Vital signs in last 24 hours: Temp:  [97.6 F (36.4 C)-99 F (37.2 C)] 99 F (37.2 C) (06/02 0534) Pulse Rate:  [81-97] 81 (06/02 0534) Resp:  [16-18] 16 (06/02 0534) BP: (96-129)/(54-65) 104/54 (06/02 0534) SpO2:  [97 %-100 %] 97 % (06/02 0534) Weight:  [69.7 kg] 69.7 kg (06/02 0535) Last BM Date: 11/04/20  Intake/Output from previous day: 06/01 0701 - 06/02 0700 In: 2612.9 [P.O.:240; I.V.:2372.9] Out: 50 [Emesis/NG output:50] Intake/Output this shift: Total I/O In: 240 [P.O.:240] Out: -   PE: General: pleasant, WD, overweight female who is laying in bed in NAD Heart: regular, rate, and rhythm.   Lungs: Respiratory effort nonlabored Abd: soft, NT, ND, +BS, reducible ventral hernia  Psych: A&Ox3 with an appropriate affect.    Lab Results:  Recent Labs    11/04/20 0454 11/05/20 0044  WBC 16.0* 14.7*  HGB 11.8* 11.4*  HCT 35.9* 34.2*  PLT 300 269   BMET Recent Labs    11/04/20 0454 11/05/20 0044  NA 136 135  K 4.2 3.6  CL 102 102  CO2 17* 23  GLUCOSE 140* 113*  BUN 39* 36*  CREATININE 2.01* 1.35*  CALCIUM 8.9 8.3*   PT/INR Recent Labs    11/04/20 0500  LABPROT 14.3  INR 1.1   CMP     Component Value Date/Time   NA 135 11/05/2020 0044   K 3.6 11/05/2020 0044   CL 102 11/05/2020 0044   CO2 23 11/05/2020 0044   GLUCOSE 113 (H) 11/05/2020 0044   BUN 36 (H) 11/05/2020 0044   CREATININE 1.35 (H) 11/05/2020 0044   CREATININE 0.88 11/03/2020 1139   CALCIUM 8.3 (L) 11/05/2020 0044   PROT 6.3 (L) 11/04/2020 0454   ALBUMIN 3.5 11/04/2020 0454   AST 19 11/04/2020 0454   ALT 19 11/04/2020 0454   ALKPHOS 71 11/04/2020 0454   BILITOT 1.1 11/04/2020 0454   GFRNONAA 42 (L) 11/05/2020 0044   GFRNONAA 74 10/08/2020 0000    GFRAA 85 10/08/2020 0000   Lipase     Component Value Date/Time   LIPASE 32 11/03/2020 1820       Studies/Results: CT ABDOMEN PELVIS WO CONTRAST  Result Date: 11/03/2020 CLINICAL DATA:  Left upper quadrant abdominal pain and diarrhea for 9 days. Episode of bloody stools. Weight loss of 8-9 pounds. Prior appendectomy and cholecystectomy. History of diverticulitis. EXAM: CT ABDOMEN AND PELVIS WITHOUT CONTRAST TECHNIQUE: Multidetector CT imaging of the abdomen and pelvis was performed following the standard protocol without IV contrast. COMPARISON:  None. FINDINGS: Lower chest: No significant pulmonary nodules or acute consolidative airspace disease. Hepatobiliary: Normal liver size. No liver mass. Cholecystectomy. No biliary ductal dilatation. Pancreas: Normal, with no mass or duct dilation. Spleen: Normal size. No mass. Adrenals/Urinary Tract: Normal adrenals. No renal stones. No hydronephrosis. No contour deforming renal masses. Normal bladder. Stomach/Bowel: Normal non-distended stomach. Moderate periumbilical ventral abdominal hernia containing an incarcerated distal small bowel loop, proximal to which the small bowel is diffusely mildly dilated and fluid-filled (up to 3.1 cm diameter), and distal to which the small bowel is relatively collapsed, compatible with mechanical distal small bowel obstruction. No thick walled small bowel loops. No pneumatosis. Appendectomy. Moderate diffuse colonic diverticulosis with no large bowel wall thickening or significant pericolonic fat stranding. Vascular/Lymphatic: Atherosclerotic nonaneurysmal  abdominal aorta. 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 No pathologically enlarged lymph nodes in the abdomen or pelvis. Reproductive: Fluid and gas mildly distends the uterine cavity. Simple 3.1 cm right adnexal cyst (series 2/image 64). No left adnexal mass.  Other: No pneumoperitoneum, ascites or focal fluid collection. Musculoskeletal: No aggressive appearing focal osseous lesions. Mild thoracolumbar spondylosis. IMPRESSION: 1. Mechanical distal small bowel obstruction due to incarcerated distal small bowel loop within a moderate size periumbilical ventral abdominal hernia. No free air. No abscess. No pneumatosis. Surgical consultation advised. 2. Moderate diffuse colonic diverticulosis with no evidence of acute diverticulitis. 3. Fluid and gas mildly distends the uterine cavity, nonspecific. Recommend correlation with pelvic ultrasound on a short term outpatient basis. 4. Simple 3.1 cm right adnexal cyst. Recommend follow-up US in 6-12 months. Note: This recommendation does not apply to premenarchal patients and to those with increased risk (genetic, family history, elevated tumor markers or other high-risk factors) of ovarian cancer. Reference: JACR 2020 Feb; 17(2):248-254 5. Aortic Atherosclerosis (ICD10-I70.0). Electronically Signed   By: Delbert Phenix M.D.   On: 11/03/2020 13:46   DG Abd Portable 1V  Result Date: 11/04/2020 CLINICAL DATA:  Abdominal pain EXAM: PORTABLE ABDOMEN - 1 VIEW COMPARISON:  11/03/2020 FINDINGS: Enteric catheter remains positioned within the distal stomach. Enteric contrast has progressed and is now present throughout the colon. Single prominent loop of bowel within the right hemiabdomen, which may represent an air-filled cecum. Otherwise, no evidence of bowel obstruction. IMPRESSION: 1. Enteric tube remains positioned within the distal stomach. 2. Single prominent loop of bowel within the right hemiabdomen, which may represent an air-filled cecum. Otherwise, no evidence of bowel obstruction. Oral contrast has progressed throughout the colon. Electronically Signed   By: Duanne Guess D.O.   On: 11/04/2020 09:21   DG Abd Portable 1 View  Result Date: 11/03/2020 CLINICAL DATA:  Enteric catheter placement EXAM: PORTABLE ABDOMEN - 1  VIEW COMPARISON:  11/03/2020 FINDINGS: Frontal view of the lower chest and upper abdomen demonstrates advancement of the enteric catheter, tip and side port coiled over the gastric antrum. Fluid-filled loops of bowel are seen throughout the abdomen and pelvis. Lung bases are clear. IMPRESSION: 1. Enteric catheter tip coiled over the gastric antrum. Electronically Signed   By: Sharlet Salina M.D.   On: 11/03/2020 23:31   DG Abd Portable 1 View  Result Date: 11/03/2020 CLINICAL DATA:  NG tube placement EXAM: PORTABLE ABDOMEN - 1 VIEW COMPARISON:  None. FINDINGS: NG tube tip is in the proximal stomach with the side port at the GE junction. IMPRESSION: NG tube tip in the stomach. Electronically Signed   By: Charlett Nose M.D.   On: 11/03/2020 22:51    Anti-infectives: Anti-infectives (From admission, onward)   Start     Dose/Rate Route Frequency Ordered Stop   11/05/20 0400  cefTRIAXone (ROCEPHIN) 1 g in sodium chloride 0.9 % 100 mL IVPB  Status:  Discontinued        1 g 200 mL/hr over 30 Minutes Intravenous Every 24 hours 11/04/20 0402 11/04/20 1218   11/04/20 0445  cefTRIAXone (ROCEPHIN) 1 g in sodium chloride 0.9 % 100 mL IVPB        1 g 200 mL/hr over 30 Minutes Intravenous  Once 11/04/20 0439 11/04/20 0550       Assessment/Plan Incarcerated ventral hernia with SBO Diarrhea  - hernia was mechanically reduced by Dr. Bedelia Person on admission -  she has had diarrhea - GI panel pending  - tolerating CLD, not clinically obstructed - ok to advance to soft diet today - hernia remains easily reducible, recommend abdominal binder when OOB to provide extra support - no indication for urgent operative intervention at this time. As long as hernia remains reducible and patient is able to tolerated PO intake and continue having bowel function, I would recommend getting over diarrheal illness first and then outpatient follow up to plan elective hernia repair. If patient starts having obstructive symptoms with  diet advancement or hernia becomes incarcerated/strangulated again will need to consider more urgent intervention   FEN: soft diet  VTE: continue to hold plavix ID: no current abx  - below per primary attending -  Hypothyroidism HTN Atrial fibrillation - plavix on hold, likely resume tomorrow if tolerating diet  AKI T2DM    LOS: 2 days    Juliet Rude, [facility] 11/05/2020, 10:45 AM Please see Amion for pager number during day hours 7:00am-4:30pm

## 2020-11-06 ENCOUNTER — Inpatient Hospital Stay (HOSPITAL_COMMUNITY): Payer: Medicare HMO

## 2020-11-06 DIAGNOSIS — K56609 Unspecified intestinal obstruction, unspecified as to partial versus complete obstruction: Secondary | ICD-10-CM | POA: Diagnosis not present

## 2020-11-06 DIAGNOSIS — R197 Diarrhea, unspecified: Secondary | ICD-10-CM

## 2020-11-06 DIAGNOSIS — R935 Abnormal findings on diagnostic imaging of other abdominal regions, including retroperitoneum: Secondary | ICD-10-CM

## 2020-11-06 DIAGNOSIS — N179 Acute kidney failure, unspecified: Secondary | ICD-10-CM | POA: Diagnosis not present

## 2020-11-06 LAB — BASIC METABOLIC PANEL
Anion gap: 6 (ref 5–15)
BUN: 26 mg/dL — ABNORMAL HIGH (ref 8–23)
CO2: 21 mmol/L — ABNORMAL LOW (ref 22–32)
Calcium: 8 mg/dL — ABNORMAL LOW (ref 8.9–10.3)
Chloride: 108 mmol/L (ref 98–111)
Creatinine, Ser: 0.9 mg/dL (ref 0.44–1.00)
GFR, Estimated: >60 mL/min (ref >60)
Glucose, Bld: 155 mg/dL — ABNORMAL HIGH (ref 70–99)
Potassium: 3.3 mmol/L — ABNORMAL LOW (ref 3.5–5.1)
Sodium: 135 mmol/L (ref 135–145)

## 2020-11-06 LAB — CBC
HCT: 33.4 % — ABNORMAL LOW (ref 36.0–46.0)
Hemoglobin: 10.8 g/dL — ABNORMAL LOW (ref 12.0–15.0)
MCH: 28.6 pg (ref 26.0–34.0)
MCHC: 32.3 g/dL (ref 30.0–36.0)
MCV: 88.6 fL (ref 80.0–100.0)
Platelets: 267 10*3/uL (ref 150–400)
RBC: 3.77 MIL/uL — ABNORMAL LOW (ref 3.87–5.11)
RDW: 13.3 % (ref 11.5–15.5)
WBC: 16.2 10*3/uL — ABNORMAL HIGH (ref 4.0–10.5)
nRBC: 0 % (ref 0.0–0.2)

## 2020-11-06 LAB — GLUCOSE, CAPILLARY
Glucose-Capillary: 125 mg/dL — ABNORMAL HIGH (ref 70–99)
Glucose-Capillary: 137 mg/dL — ABNORMAL HIGH (ref 70–99)

## 2020-11-06 MED ORDER — SACCHAROMYCES BOULARDII 250 MG PO CAPS
250.0000 mg | ORAL_CAPSULE | Freq: Two times a day (BID) | ORAL | Status: DC
  Administered 2020-11-06 – 2020-11-09 (×7): 250 mg via ORAL
  Filled 2020-11-06 (×7): qty 1

## 2020-11-06 MED ORDER — POTASSIUM CHLORIDE CRYS ER 20 MEQ PO TBCR
40.0000 meq | EXTENDED_RELEASE_TABLET | Freq: Once | ORAL | Status: AC
  Administered 2020-11-06: 40 meq via ORAL
  Filled 2020-11-06: qty 2

## 2020-11-06 MED ORDER — LOPERAMIDE HCL 2 MG PO CAPS
2.0000 mg | ORAL_CAPSULE | Freq: Four times a day (QID) | ORAL | Status: DC | PRN
  Administered 2020-11-06 – 2020-11-07 (×2): 2 mg via ORAL
  Filled 2020-11-06 (×2): qty 1

## 2020-11-06 MED ORDER — LACTATED RINGERS IV SOLN: INTRAVENOUS | Status: DC

## 2020-11-06 NOTE — Progress Notes (Signed)
Following up on surgical decision, this may be a outpatient elective surgery or inpatient surgery. I discussed with Dr. Jens Som, if decided to proceed with surgery during this admission, then patient may proceed without stress test especially if it is urgent. However if she has time to do the surgery as outpatient electively, we would likely to arrange a nuclear stress test to risk stratify her.   I have not scheduled her stress test and follow up yet. Will wait for final decision by surgery and hospitalist.   I will tentatively leave her on the cardiology list to follow up on the decision regarding surgery.

## 2020-11-06 NOTE — Progress Notes (Signed)
Echocardiogram showed normal LV function.  Patient can be discharged from a cardiac standpoint.  I will arrange follow-up with APP in approximately 2 weeks.  Since this will be an elective surgery I will arrange a Lexiscan nuclear study for risk stratification preoperatively (she has limited mobility and history of vascular disease).  Please call with questions. Olga Millers, MD

## 2020-11-06 NOTE — Consult Note (Addendum)
Referring Provider:  [facility] Primary Care Physician:  Everrett Coombe, DO Primary Gastroenterologist:  Gentry Fitz  Reason for Consultation:  Diarrhea  HPI: Erin Mitchell is a 75 y.o. female with medical history significant for chronic ventral/incisional abdominal hernia, type 2 diabetes mellitus, acquired hypothyroidism, essential hypertension, paroxysmal atrial fibrillation not on anticoagulation but is on Plavix.  She just moved here with her husband from [facility] in March to be closer to their children.  She was admitted to [facility] on 11/03/2020 with small bowel obstruction in the setting of incarcerated ventral abdominal hernia after presenting from home to [facility] ED complaining of abdominal pain.  Hernia was reduced by Dr. Bedelia Person.   CT scan of the abdomen and pelvis without contrast showed the following:  IMPRESSION: 1. Mechanical distal small bowel obstruction due to incarcerated distal small bowel loop within a moderate size periumbilical ventral abdominal hernia. No free air. No abscess. No pneumatosis. Surgical consultation advised. 2. Moderate diffuse colonic diverticulosis with no evidence of acute diverticulitis. 3. Fluid and gas mildly distends the uterine cavity, nonspecific. Recommend correlation with pelvic ultrasound on a short term outpatient basis. 4. Simple 3.1 cm right adnexal cyst. Recommend follow-up US in 6-12 months. Note: This recommendation does not apply to premenarchal patients and to those with increased risk (genetic, family history, elevated tumor markers or other high-risk factors) of ovarian cancer. Reference: JACR 2020 Feb; 17(2):248-254 5. Aortic Atherosclerosis (ICD10-I70.0).  GI is being consulted for diarrhea.  She tells me that historically she has an episode of diarrhea about once a month.  This diarrhea began about 10 days ago, several days before the SBO/hernia issue.  Stool studies for CDiff and GI pathogen panel here are negative.  Imodium  does seem to help when she takes it.  Denies rectal bleeding.  Never had colonoscopy in the past.  None of her home medications are new.  No recent antibiotic use.  She was having more abdominal pain this AM so surgery has ordered another CT scan that has not yet been performed.   Past Medical History:  Diagnosis Date  . Atrial fibrillation (HCC)   . Diabetes (HCC)   . Exposure to [employer]   . Hypertension   . Kidney disease   . Low thyroid stimulating hormone (TSH) level   . Thyroid disease     Past Surgical History:  Procedure Laterality Date  . APPENDECTOMY    . CESAREAN SECTION    . CHOLECYSTECTOMY    . HERNIA REPAIR    . 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 Authorizing Provider  clopidogrel (PLAVIX) 75 MG tablet TAKE 1 TABLET BY MOUTH ONCE DAILY Patient taking differently: Take 75 mg by mouth in the morning. 10/12/20  Yes Everrett Coombe, DO  lisinopril (ZESTRIL) 20 MG tablet TAKE 1 TABLET BY MOUTH TWICE DAILY Patient taking differently: Take 20 mg by mouth in the morning and at bedtime. 10/12/20  Yes Everrett Coombe, DO  loperamide (IMODIUM A-D) 2 MG tablet Take 2 mg by mouth 4 (four) times daily as needed for diarrhea or loose stools.   Yes [provider]  metFORMIN (GLUCOPHAGE) 500 MG tablet Take 1 tablet (500 mg total) by mouth 2 (two) times daily with a meal. 10/16/20 04/14/21 Yes Everrett Coombe, DO  metoprolol tartrate (LOPRESSOR) 25 MG tablet TAKE 1 TABLET BY MOUTH TWICE DAILY WITH FOOD Patient taking differently: Take  25 mg by mouth 2 (two) times daily with a meal. 10/12/20  Yes Luetta Nutting, DO  NP THYROID 90 MG tablet Take 90 mg by mouth daily before breakfast.   Yes [provider]  rosuvastatin (CRESTOR) 20 MG tablet TAKE 1 TABLET BY MOUTH ONCE DAILY Patient taking differently: Take 20 mg by mouth at bedtime. 10/12/20  Yes Luetta Nutting, DO  Alcohol Swabs (B-D SINGLE USE  SWABS REGULAR) PADS Use as directed. 10/12/20   Luetta Nutting, DO  Blood Glucose Monitoring Suppl (TRUE METRIX METER) w/Device KIT Use as directed to test blood glucose daily 10/12/20   Luetta Nutting, DO  glucose blood (TRUE METRIX BLOOD GLUCOSE TEST) test strip Use as instructed 10/12/20   Luetta Nutting, DO  Loperamide HCl (IMODIUM PO) Take 1 tablet by mouth 2 (two) times daily as needed. Patient not taking: Reported on 11/03/2020    [provider]  thyroid (ARMOUR) 90 MG tablet Take 90 mg by mouth daily before breakfast. Patient not taking: Reported on 11/03/2020    [provider]  TRUEplus Lancets 33G MISC Use as directed daily. 10/12/20   Luetta Nutting, DO    Current Facility-Administered Medications  Medication Dose Route Frequency Provider Last Rate Last Admin  . acetaminophen (TYLENOL) tablet 650 mg  650 mg Oral Q6H PRN Howerter, Justin B, DO       Or  . acetaminophen (TYLENOL) suppository 650 mg  650 mg Rectal Q6H PRN Howerter, Justin B, DO      . fentaNYL (SUBLIMAZE) injection 50 mcg  50 mcg Intravenous Q2H PRN Howerter, Justin B, DO   50 mcg at 11/03/20 2210  . insulin aspart (novoLOG) injection 0-6 Units  0-6 Units Subcutaneous Q6H Howerter, Justin B, DO   1 Units at 11/05/20 1758  . loperamide (IMODIUM) capsule 2 mg  2 mg Oral QID PRN Modena Jansky, MD   2 mg at 11/06/20 0554  . naloxone [facility]) injection 0.4 mg  0.4 mg Intravenous PRN Howerter, Justin B, DO      . ondansetron (ZOFRAN) injection 4 mg  4 mg Intravenous Q6H PRN Howerter, Justin B, DO   4 mg at 11/03/20 2210  . rosuvastatin (CRESTOR) tablet 20 mg  20 mg Oral QHS Modena Jansky, MD   20 mg at 11/05/20 2300  . thyroid (ARMOUR) tablet 90 mg  90 mg Oral Q0600 Modena Jansky, MD   90 mg at 11/06/20 0553    Allergies as of 11/03/2020  . (No Known Allergies)    Family History  Problem Relation Age of Onset  . Hypertension Father   . Diabetes Brother   . Emphysema Mother     Social History    Socioeconomic History  . Marital status: Married    Spouse name: Not on file  . Number of children: Not on file  . Years of education: Not on file  . Highest education level: Not on file  Occupational History  . Occupation: Retired  Tobacco Use  . Smoking status: Former Smoker    Packs/day: 0.25    Years: 1.00    Pack years: 0.25    Types: Cigarettes    Quit date: 06/06/1970    Years since quitting: 50.4  . Smokeless tobacco: Never Used  Vaping Use  . Vaping Use: Never used  Substance and Sexual Activity  . Alcohol use: Never  . Drug use: Never  . Sexual activity: Not on file  Other Topics Concern  . Not on  file  Social History Narrative  . Not on file   Social Determinants of Health   Financial Resource Strain: Not on file  Food Insecurity: Not on file  Transportation Needs: Not on file  Physical Activity: Not on file  Stress: Not on file  Social Connections: Not on file  Intimate Partner Violence: Not on file    Review of Systems: ROS is O/W negative except as mentioned in HPI.  Physical Exam: Vital signs in last 24 hours: Temp:  [98.7 F (37.1 C)-99.3 F (37.4 C)] 98.7 F (37.1 C) (06/03 0608) Pulse Rate:  [84-91] 84 (06/03 0608) Resp:  [17-18] 18 (06/03 0608) BP: (99-138)/(53-72) 99/63 (06/03 0608) SpO2:  [97 %-100 %] 98 % (06/03 0608) Weight:  [74 kg] 74 kg (06/03 0500) Last BM Date: 11/06/20 General:  Alert, Well-developed, well-nourished, pleasant and cooperative in NAD Head:  Normocephalic and atraumatic. Eyes:  Sclera clear, no icterus.  Conjunctiva pink. Ears:  Normal auditory acuity. Mouth:  No deformity or lesions.   Lungs:  Clear throughout to auscultation.  No wheezes, crackles, or rhonchi.  Heart:  Regular rate and rhythm; no murmurs, clicks, rubs, or gallops. Abdomen:  Soft, non-distended.  BS present and somewhat hyperactive.  Tender diffusely but more so at hernia site in mid-abdomen.  Msk:  Symmetrical without gross  deformities. Pulses:  Normal pulses noted. Extremities:  2nd and 3rd toes missing on right foot. Neurologic:  Alert and oriented x 4;  grossly normal neurologically. Skin:  Intact without significant lesions or rashes. Psych:  Alert and cooperative. Normal mood and affect.  Intake/Output from previous day: 06/02 0701 - 06/03 0700 In: 54 [P.O.:820] Out: -  Intake/Output this shift: Total I/O In: 180 [P.O.:180] Out: -   Lab Results: Recent Labs    11/03/20 1820 11/04/20 0454 11/05/20 0044  WBC 13.7* 16.0* 14.7*  HGB 13.4 11.8* 11.4*  HCT 40.4 35.9* 34.2*  PLT 321 300 269   BMET Recent Labs    11/04/20 0454 11/05/20 0044 11/06/20 0103  NA 136 135 135  K 4.2 3.6 3.3*  CL 102 102 108  CO2 17* 23 21*  GLUCOSE 140* 113* 155*  BUN 39* 36* 26*  CREATININE 2.01* 1.35* 0.90  CALCIUM 8.9 8.3* 8.0*   LFT Recent Labs    11/04/20 0454  PROT 6.3*  ALBUMIN 3.5  AST 19  ALT 19  ALKPHOS 71  BILITOT 1.1   PT/INR Recent Labs    11/04/20 0500  LABPROT 14.3  INR 1.1   Studies/Results: ECHOCARDIOGRAM COMPLETE  Result Date: 11/05/2020    ECHOCARDIOGRAM REPORT   Patient Name:   MALAIA BUCHTA Date of Exam: 11/05/2020 Medical Rec #:  [id number]        Height:       64.0 in Accession #:    [id number]       Weight:       153.7 lb Date of Birth:  05/10/1946        BSA:          1.749 m Patient Age:    33 years         BP:           104/54 mmHg Patient Gender: F                HR:           80 bpm. Exam Location:  Inpatient Procedure: 2D Echo, Cardiac Doppler and Color Doppler Indications:    I48.91* Unspeicified  atrial fibrillation  History:        Patient has no prior history of Echocardiogram examinations.                 Arrythmias:Atrial Fibrillation; Risk Factors:Hypertension and                 Diabetes.  Sonographer:    Bernadene Person RDCS Referring Phys: [place]  1. Left ventricular ejection fraction, by estimation, is 65 to 70%. The left ventricle  has normal function. The left ventricle has no regional wall motion abnormalities. There is mild left ventricular hypertrophy of the basal-septal segment. Left ventricular diastolic parameters are consistent with Grade I diastolic dysfunction (impaired relaxation).  2. Right ventricular systolic function is normal. The right ventricular size is normal. There is normal pulmonary artery systolic pressure.  3. The mitral valve is normal in structure. No evidence of mitral valve regurgitation. No evidence of mitral stenosis.  4. The aortic valve is normal in structure. Aortic valve regurgitation is not visualized. No aortic stenosis is present.  5. The inferior vena cava is normal in size with greater than 50% respiratory variability, suggesting right atrial pressure of 3 mmHg. FINDINGS  Left Ventricle: Left ventricular ejection fraction, by estimation, is 65 to 70%. The left ventricle has normal function. The left ventricle has no regional wall motion abnormalities. The left ventricular internal cavity size was normal in size. There is  mild left ventricular hypertrophy of the basal-septal segment. Left ventricular diastolic parameters are consistent with Grade I diastolic dysfunction (impaired relaxation). Normal left ventricular filling pressure. Right Ventricle: The right ventricular size is normal. No increase in right ventricular wall thickness. Right ventricular systolic function is normal. There is normal pulmonary artery systolic pressure. The tricuspid regurgitant velocity is 2.39 m/s, and  with an assumed right atrial pressure of 3 mmHg, the estimated right ventricular systolic pressure is 20.2 mmHg. Left Atrium: Left atrial size was normal in size. Right Atrium: Right atrial size was normal in size. Pericardium: There is no evidence of pericardial effusion. Mitral Valve: The mitral valve is normal in structure. Mild mitral annular calcification. No evidence of mitral valve regurgitation. No evidence of  mitral valve stenosis. Tricuspid Valve: The tricuspid valve is normal in structure. Tricuspid valve regurgitation is trivial. No evidence of tricuspid stenosis. Aortic Valve: The aortic valve is normal in structure. Aortic valve regurgitation is not visualized. No aortic stenosis is present. Pulmonic Valve: The pulmonic valve was normal in structure. Pulmonic valve regurgitation is not visualized. No evidence of pulmonic stenosis. Aorta: The aortic root is normal in size and structure. Venous: The inferior vena cava is normal in size with greater than 50% respiratory variability, suggesting right atrial pressure of 3 mmHg. IAS/Shunts: There is redundancy of the interatrial septum. No atrial level shunt detected by color flow Doppler.  LEFT VENTRICLE PLAX 2D LVIDd:         4.20 cm  Diastology LVIDs:         2.60 cm  LV e' medial:    4.50 cm/s LV PW:         1.00 cm  LV E/e' medial:  15.1 LV IVS:        1.20 cm  LV e' lateral:   7.43 cm/s LVOT diam:     2.00 cm  LV E/e' lateral: 9.1 LV SV:         86 LV SV Index:   49 LVOT Area:  3.14 cm  RIGHT VENTRICLE RV S prime:     12.40 cm/s TAPSE (M-mode): 2.6 cm LEFT ATRIUM             Index       RIGHT ATRIUM           Index LA diam:        3.10 cm 1.77 cm/m  RA Area:     11.50 cm LA Vol (A2C):   45.3 ml 25.90 ml/m RA Volume:   23.60 ml  13.49 ml/m LA Vol (A4C):   45.3 ml 25.90 ml/m LA Biplane Vol: 46.2 ml 26.41 ml/m  AORTIC VALVE LVOT Vmax:   137.00 cm/s LVOT Vmean:  97.500 cm/s LVOT VTI:    0.273 m  AORTA Ao Root diam: 3.20 cm Ao Asc diam:  3.10 cm MITRAL VALVE                TRICUSPID VALVE MV Area (PHT): 3.63 cm     TR Peak grad:   22.8 mmHg MV Decel Time: 209 msec     TR Vmax:        239.00 cm/s MV E velocity: 67.90 cm/s MV A velocity: 106.00 cm/s  SHUNTS MV E/A ratio:  0.64         Systemic VTI:  0.27 m                             Systemic Diam: 2.00 cm Fransico Him MD Electronically signed by Fransico Him MD Signature Date/Time: 11/05/2020/2:09:10 PM    Final      IMPRESSION:  *Diarrhea:  Present for about the past 10 days, before the SBO/incarcerated hernia issue began.  Cdiff and GI pathogen panel negative.  Never had colonoscopy in the past. *Incarcerated ventral hernia with small bowel obstruction:  Reduced and plans for elective repair at a later date unless continues to be an issue while here.  Repeat CT scan pending. *Antiplatelet use with Plavix  Currently on hold with last dose 5/31.  PLAN: -Await results of CT scan. -Will add Florastor probiotic.   -May want to try scheduling Imodium a couple of times per day rather than prn.   Laban Emperor. Zehr  11/06/2020, 9:16 AM  GI ATTENDING  History, laboratories, x-rays reviewed.  Patient personally seen and examined.  Husband in room.  Agree with comprehensive consultation note as outlined above. IMPRESSION: 1.  Incarcerated ventral hernia with small bowel obstruction.  Still with significant pain in the periumbilical region.  Repeat CT scan yet to be performed.  I suspect she will need surgical correction of this problem sooner rather than later but will defer to general surgery. 2.  Diarrhea.  Less than 2 weeks duration.  Noninflammatory.  The patient is nontoxic.  The colon is unremarkable on CT (save incidental diverticulosis).  Stool studies were all negative for enteric pathogens.  Increase loose stools since her CT is likely secondary to CT contrast.  She is having less loose stools today. RECOMMENDATIONS: 1.  Incarcerated ventral hernia with small bowel obstruction per general surgery 2.  Supportive care as we would normally with acute diarrheal illness.  No role for colonoscopy at present.  If problems with diarrhea persists beyond 6 weeks, she could be seen in our office outpatient for additional work-up.  Will follow.  Docia Chuck. Geri Seminole., M.D. [facility] Division of Gastroenterology

## 2020-11-06 NOTE — Progress Notes (Signed)
Addendum  CT abdomen report reviewed.  Findings suggestive of enteritis.  Rest as per detailed report. Keep NPO overnight. Continue IVF. GI to follow up in am.   Marcellus Scott, MD, [place], [facility]. [facility]  To contact the attending provider between 7A-7P or the covering provider during after hours 7P-7A, please log into the web site [email or web address] and access using universal [place] password for that web site. If you do not have the password, please call the hospital operator.

## 2020-11-06 NOTE — Progress Notes (Signed)
Progress Note  2 Days Post-Op  Subjective: More pain and diarrhea overnight. She denies nausea or vomiting. She is unsure whether hernia feels like it is back out or not.   Objective: Vital signs in last 24 hours: Temp:  [98.7 F (37.1 C)-99.3 F (37.4 C)] 98.7 F (37.1 C) (06/03 0608) Pulse Rate:  [84-91] 84 (06/03 0608) Resp:  [17-18] 18 (06/03 0608) BP: (99-138)/(53-72) 99/63 (06/03 0608) SpO2:  [97 %-100 %] 98 % (06/03 6578) Weight:  [74 kg] 74 kg (06/03 0500) Last BM Date: 11/06/20  Intake/Output from previous day: 06/02 0701 - 06/03 0700 In: 820 [P.O.:820] Out: -  Intake/Output this shift: Total I/O In: 180 [P.O.:180] Out: -   PE: General: pleasant, WD, overweight female who is laying in bed in NAD Heart: regular, rate, and rhythm.   Lungs: Respiratory effort nonlabored Abd: soft, more tender out through abdominal sides, ND, +BS, partially reducible ventral hernia  Psych: A&Ox3 with an appropriate affect.    Lab Results:  Recent Labs    11/05/20 0044 11/06/20 0103  WBC 14.7* 16.2*  HGB 11.4* 10.8*  HCT 34.2* 33.4*  PLT 269 267   BMET Recent Labs    11/05/20 0044 11/06/20 0103  NA 135 135  K 3.6 3.3*  CL 102 108  CO2 23 21*  GLUCOSE 113* 155*  BUN 36* 26*  CREATININE 1.35* 0.90  CALCIUM 8.3* 8.0*   PT/INR Recent Labs    11/04/20 0500  LABPROT 14.3  INR 1.1   CMP     Component Value Date/Time   NA 135 11/06/2020 0103   K 3.3 (L) 11/06/2020 0103   CL 108 11/06/2020 0103   CO2 21 (L) 11/06/2020 0103   GLUCOSE 155 (H) 11/06/2020 0103   BUN 26 (H) 11/06/2020 0103   CREATININE 0.90 11/06/2020 0103   CREATININE 0.88 11/03/2020 1139   CALCIUM 8.0 (L) 11/06/2020 0103   PROT 6.3 (L) 11/04/2020 0454   ALBUMIN 3.5 11/04/2020 0454   AST 19 11/04/2020 0454   ALT 19 11/04/2020 0454   ALKPHOS 71 11/04/2020 0454   BILITOT 1.1 11/04/2020 0454   GFRNONAA >60 11/06/2020 0103   GFRNONAA 74 10/08/2020 0000   GFRAA 85 10/08/2020 0000   Lipase      Component Value Date/Time   LIPASE 32 11/03/2020 1820       Studies/Results: ECHOCARDIOGRAM COMPLETE  Result Date: 11/05/2020    ECHOCARDIOGRAM REPORT   Patient Name:   SPENSER Wilcock Date of Exam: 11/05/2020 Medical Rec #:  [id number]        Height:       64.0 in Accession #:    [id number]       Weight:       153.7 lb Date of Birth:  [birth year]        BSA:          1.749 m Patient Age:    75 years         BP:           104/54 mmHg Patient Gender: F                HR:           80 bpm. Exam Location:  Inpatient Procedure: 2D Echo, Cardiac Doppler and Color Doppler Indications:    I48.91* Unspeicified atrial fibrillation  History:        Patient has no prior history of Echocardiogram examinations.  Arrythmias:Atrial Fibrillation; Risk Factors:Hypertension and                 Diabetes.  Sonographer:    Eulah Pont RDCS Referring Phys: [id number] BRIAN S CRENSHAW IMPRESSIONS  1. Left ventricular ejection fraction, by estimation, is 65 to 70%. The left ventricle has normal function. The left ventricle has no regional wall motion abnormalities. There is mild left ventricular hypertrophy of the basal-septal segment. Left ventricular diastolic parameters are consistent with Grade I diastolic dysfunction (impaired relaxation).  2. Right ventricular systolic function is normal. The right ventricular size is normal. There is normal pulmonary artery systolic pressure.  3. The mitral valve is normal in structure. No evidence of mitral valve regurgitation. No evidence of mitral stenosis.  4. The aortic valve is normal in structure. Aortic valve regurgitation is not visualized. No aortic stenosis is present.  5. The inferior vena cava is normal in size with greater than 50% respiratory variability, suggesting right atrial pressure of 3 mmHg. FINDINGS  Left Ventricle: Left ventricular ejection fraction, by estimation, is 65 to 70%. The left ventricle has normal function. The left ventricle has no regional  wall motion abnormalities. The left ventricular internal cavity size was normal in size. There is  mild left ventricular hypertrophy of the basal-septal segment. Left ventricular diastolic parameters are consistent with Grade I diastolic dysfunction (impaired relaxation). Normal left ventricular filling pressure. Right Ventricle: The right ventricular size is normal. No increase in right ventricular wall thickness. Right ventricular systolic function is normal. There is normal pulmonary artery systolic pressure. The tricuspid regurgitant velocity is 2.39 m/s, and  with an assumed right atrial pressure of 3 mmHg, the estimated right ventricular systolic pressure is 25.8 mmHg. Left Atrium: Left atrial size was normal in size. Right Atrium: Right atrial size was normal in size. Pericardium: There is no evidence of pericardial effusion. Mitral Valve: The mitral valve is normal in structure. Mild mitral annular calcification. No evidence of mitral valve regurgitation. No evidence of mitral valve stenosis. Tricuspid Valve: The tricuspid valve is normal in structure. Tricuspid valve regurgitation is trivial. No evidence of tricuspid stenosis. Aortic Valve: The aortic valve is normal in structure. Aortic valve regurgitation is not visualized. No aortic stenosis is present. Pulmonic Valve: The pulmonic valve was normal in structure. Pulmonic valve regurgitation is not visualized. No evidence of pulmonic stenosis. Aorta: The aortic root is normal in size and structure. Venous: The inferior vena cava is normal in size with greater than 50% respiratory variability, suggesting right atrial pressure of 3 mmHg. IAS/Shunts: There is redundancy of the interatrial septum. No atrial level shunt detected by color flow Doppler.  LEFT VENTRICLE PLAX 2D LVIDd:         4.20 cm  Diastology LVIDs:         2.60 cm  LV e' medial:    4.50 cm/s LV PW:         1.00 cm  LV E/e' medial:  15.1 LV IVS:        1.20 cm  LV e' lateral:   7.43 cm/s LVOT  diam:     2.00 cm  LV E/e' lateral: 9.1 LV SV:         86 LV SV Index:   49 LVOT Area:     3.14 cm  RIGHT VENTRICLE RV S prime:     12.40 cm/s TAPSE (M-mode): 2.6 cm LEFT ATRIUM             Index  RIGHT ATRIUM           Index LA diam:        3.10 cm 1.77 cm/m  RA Area:     11.50 cm LA Vol (A2C):   45.3 ml 25.90 ml/m RA Volume:   23.60 ml  13.49 ml/m LA Vol (A4C):   45.3 ml 25.90 ml/m LA Biplane Vol: 46.2 ml 26.41 ml/m  AORTIC VALVE LVOT Vmax:   137.00 cm/s LVOT Vmean:  97.500 cm/s LVOT VTI:    0.273 m  AORTA Ao Root diam: 3.20 cm Ao Asc diam:  3.10 cm MITRAL VALVE                TRICUSPID VALVE MV Area (PHT): 3.63 cm     TR Peak grad:   22.8 mmHg MV Decel Time: 209 msec     TR Vmax:        239.00 cm/s MV E velocity: 67.90 cm/s MV A velocity: 106.00 cm/s  SHUNTS MV E/A ratio:  0.64         Systemic VTI:  0.27 m                             Systemic Diam: 2.00 cm Armanda Magic MD Electronically signed by Armanda Magic MD Signature Date/Time: 11/05/2020/2:09:10 PM    Final     Anti-infectives: Anti-infectives (From admission, onward)   Start     Dose/Rate Route Frequency Ordered Stop   11/05/20 0400  cefTRIAXone (ROCEPHIN) 1 g in sodium chloride 0.9 % 100 mL IVPB  Status:  Discontinued        1 g 200 mL/hr over 30 Minutes Intravenous Every 24 hours 11/04/20 0402 11/04/20 1218   11/04/20 0445  cefTRIAXone (ROCEPHIN) 1 g in sodium chloride 0.9 % 100 mL IVPB        1 g 200 mL/hr over 30 Minutes Intravenous  Once 11/04/20 0439 11/04/20 0550       Assessment/Plan Incarcerated ventral hernia with SBO Diarrhea  - hernia was mechanically reduced by Dr. Bedelia Person on admission - she has had diarrhea - GI panel and C. Diff both negative, GI has been consulted by primary team  - Pt started having worsening diarrhea with soft diet yesterday  - hernia was less reducible yesterday afternoon and patient having increased abdominal pain today  - Will discuss with MD but I am becoming concerned that patient  may end up needing hernia repair this admission rather than electively as an outpatient   FEN: soft diet  VTE: continue to hold plavix ID: no current abx  - below per primary attending -  Hypothyroidism HTN Atrial fibrillation - plavix on hold, cards recommending a Lexiscan nuclear study for risk stratification preoperatively  Peripheral vascular disease AKI T2DM   LOS: 3 days    Juliet Rude, [facility] 11/06/2020, 9:49 AM Please see Amion for pager number during day hours 7:00am-4:30pm

## 2020-11-06 NOTE — Progress Notes (Addendum)
PROGRESS NOTE   Erin BargeMarjorie Behney  UJW:[id number]    DOB: 04/20/1946    DOA: 11/03/2020  PCP: Everrett CoombeMatthews, Cody, DO   I have briefly reviewed patients [facility].  Chief Complaint  Patient presents with  . Abdominal Pain    Brief Narrative:   75 y.o. female with medical history significant for chronic/recurrent ventral abdominal hernia, type 2 diabetes mellitus, acquired hypothyroidism, essential hypertension, paroxysmal atrial fibrillation not on chronic formal anticoagulation, who is [facility]2020 with small bowel obstruction in the setting of incarcerated ventral abdominal hernia after [facility] ED complaining of abdominal pain.   Improved after supportive management but as discussed with CCS today, may consider surgical fixation this admission.  Cardiology consulted for preop clearance.  Decision regarding surgery still undetermined, ideally would prefer to do it electively as outpatient pending resolution of diarrheal illness but may require urgent surgical intervention if hernia becomes nonreducible again.  Awaiting repeat CT abdomen 6/3.  [facility] consulted for acute unresolving diarrhea.   Assessment & Plan:  Principal Problem:   SBO (small bowel obstruction) (HCC) Active Problems:   PAF (paroxysmal atrial fibrillation) (HCC)   Acquired hypothyroidism   Incarcerated ventral hernia   Nausea   Abdominal pain   AKI (acute kidney injury) (HCC)   Leukocytosis   Acute cystitis   Hypertension   Diabetes (HCC)   Incarcerated ventral incisional hernia with small bowel obstruction: General surgery consulted.  Treated supportively with NPO, NG tube to low wall suction, IV fluids.  Clinically improved.  Hernia mechanically reduced on night of admission by general surgery.  SBO resolved.  Defer to surgeons regarding initiation of diet.  Plavix on hold in case urgent surgery becomes necessary.  Cardiology input  appreciated.  Have cleared for surgery.  2D echo 6/2: LVEF 65-70%.  If surgery done electively, [facility]  Per Dr. Jens Somrenshaw, however if surgery needed urgently then may proceed without nuclear study.  Since yesterday, hernia not fully reducible, more painful.  CCS getting repeat CT abdomen to evaluate and patient may eventually need surgical fixation this admission.  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  Current diarrhea preceded her SBO by about 9 to 10 days.  No recent antibiotic exposure.  C. difficile and GI panel PCR negative.  Profuse diarrhea over the last 24 hours despite Imodium.  Has had barium enemas but denies prior colonoscopies.  [facility] consulted.  Recommend supportive care, have added probiotics.  Pending CT abdomen results, consider changing Imodium to scheduled.  Continue gentle IV fluids to avoid dehydration from GI losses.  Acute kidney injury: Likely multifactorial due to prerenal from GI losses, poor oral intake along with lisinopril use.  Intermittent soft blood pressures and ATN also possible.  Presented with creatinine of 0.88 which rapidly increased to 2.01.  Resolved.  Continue gentle IVF.  Anion gap metabolic acidosis: Due to GI losses.  Resolved.  Hypokalemia: Replace and follow.  Dilutional anemia: No blood loss.  Stable.  Asymptomatic bacteriuria: Patient denies UTI symptoms, has had UTI in the past.  Discontinued empirically started ceftriaxone.  Type II DM with proteinuria/nephropathy: Holding metformin.  SSI for now.  Reasonable inpatient control.  Hold lisinopril due to AKI.  Acquired hypothyroidism: Continue thyroid [place]  Essential hypertension: Holding lisinopril due to AKI and Lopressor due to soft or normal blood pressures.   Resume Lopressor when able.  Ongoing soft BPs.  Paroxysmal A. fib: CHA2DS2-VASc score of 3.  As per detailed discussion with patient, she had a single episode of A. fib post vascular procedure in [place] last year, extensively evaluated by cardiology including Holter monitoring which did not show any further A. fib and even had a TEE due to concerns for endocarditis which was ruled out.  Patient likely not on anticoagulation due to single postprocedure A. fib episode.  Metoprolol discussion as noted above.  Plavix currently on hold.  Cardiology input appreciated.  2D echo with normal EF.  Remains in sinus rhythm.  Incidental CT abdomen findings 11/03/2020, outpatient follow-up Moderate diffuse colonic diverticulosis. Fluid and gas mildly distends the uterine cavity, nonspecific.  Pelvic ultrasound as outpatient Simple 3.1 cm right adnexal cyst, recommend follow-up ultrasound in 6 to 12 months.   Body mass index is 28 kg/m.    DVT prophylaxis: SCDs Start: 11/03/20 2017     Code Status: DNR Family Communication: None at bedside.  Patient declined my offer to update spouse regarding her condition.  She stated that she has been informing him. Disposition:  Status is: Inpatient  Remains inpatient appropriate because:Inpatient level of care appropriate due to severity of illness   Dispo: The patient is from: Home              Anticipated d/c is to: Home              Patient currently is not medically stable to d/c.          Consultants:   General surgery Cardiology [facility].  Procedures:   NG tube- discontinued 6/1  Antimicrobials:    Anti-infectives (From admission, onward)   Start     Dose/Rate Route Frequency Ordered Stop   11/05/20 0400  cefTRIAXone (ROCEPHIN) 1 g in sodium chloride 0.9 % 100 mL IVPB  Status:  Discontinued        1 g 200 mL/hr over 30 Minutes Intravenous Every 24 hours 11/04/20 0402 11/04/20 1218   11/04/20 0445  cefTRIAXone (ROCEPHIN) 1 g in sodium  chloride 0.9 % 100 mL IVPB        1 g 200 mL/hr over 30 Minutes Intravenous  Once 11/04/20 0439 11/04/20 0550        Subjective:  Reports profuse diarrhea over the last 24 hours.  Indicates "20 episodes" in the daytime and about "5 episodes" in the night.  Watery, moderate volume.  Abdomen more painful in the left lower quadrant.  Hernia not fully reducible.  Transient nausea but no vomiting.  Tolerated diet.  Objective:   Vitals:   11/05/20 1300 11/05/20 2005 11/06/20 0500 11/06/20 0608  BP: (!) 125/53 138/72  99/63  Pulse: 86 91  84  Resp: Temp: 99.3 F (37.4 C) 98.9 F (37.2 C)  98.7 F (37.1 C)  TempSrc: Oral Oral  Oral  SpO2: 97% 100%  98%  Weight:   74 kg     General exam: Elderly female, moderately built and nourished, seen lying comfortably supine in bed. Respiratory system: Clear to auscultation.  No increased work of breathing. Cardiovascular system: S1-S2 heard, RRR.  No JVD or murmurs.  No pedal edema.  Telemetry personally reviewed: Sinus rhythm. Gastrointestinal system: Abdomen is soft and nondistended.  Mild tenderness in periumbilical and  left lower quadrant without peritoneal signs.  Subumbilical very small ventral hernia that is mildly tender to touch and not fully reducible.  Normal bowel sounds heard Central nervous system: Alert and oriented. No focal neurological deficits. Extremities: Symmetric 5 x 5 power. Skin: No rashes, lesions or ulcers Psychiatry: Judgement and insight appear normal. Mood & affect appropriate.     Data Reviewed:   I have personally reviewed following labs and imaging studies   CBC: Recent Labs  Lab 11/03/20 1139 11/03/20 1820 11/04/20 0454 11/05/20 0044 11/06/20 0103  WBC 9.7 13.7* 16.0* 14.7* 16.2*  NEUTROABS 6,809 9.6* 12.9*  --   --   HGB 12.4 13.4 11.8* 11.4* 10.8*  HCT 39.7 40.4 35.9* 34.2* 33.4*  MCV 88.2 87.4 88.2 86.6 88.6  PLT 314 321 300 269 267    Basic Metabolic Panel: Recent Labs  Lab  11/04/20 0454 11/05/20 0044 11/06/20 0103  NA 136 135 135  K 4.2 3.6 3.3*  CL 102 102 108  CO2 17* 23 21*  GLUCOSE 140* 113* 155*  BUN 39* 36* 26*  CREATININE 2.01* 1.35* 0.90  CALCIUM 8.9 8.3* 8.0*  MG 1.7  --   --     Liver Function Tests: Recent Labs  Lab 11/03/20 1139 11/03/20 1820 11/04/20 0454  AST 20 25 19   ALT 18 20 19   ALKPHOS  --  75 71  BILITOT 0.5 0.8 1.1  PROT 6.5 7.3 6.3*  ALBUMIN  --  4.0 3.5    CBG: Recent Labs  Lab 11/05/20 2310 11/06/20 0548 11/06/20 1207  GLUCAP 119* 125* 137*    Microbiology Studies:   Recent Results (from the past 240 hour(s))  Resp Panel by RT-PCR (Flu A&B, Covid) Nasopharyngeal Swab     Status: None   Collection Time: 11/03/20  8:48 PM   Specimen: Nasopharyngeal Swab; Nasopharyngeal(NP) swabs in vial transport medium  Result Value Ref Range Status   SARS Coronavirus 2 by RT PCR NEGATIVE NEGATIVE Final    Comment: (NOTE) SARS-CoV-2 target nucleic acids are NOT DETECTED.  The SARS-CoV-2 RNA is generally detectable in upper respiratory specimens during the acute phase of infection. The lowest concentration of SARS-CoV-2 viral copies this assay can detect is 138 copies/mL. A negative result does not preclude SARS-Cov-2 infection and should not be used as the sole basis for treatment or other patient management decisions. A negative result may occur with  improper specimen collection/handling, submission of specimen other than nasopharyngeal swab, presence of viral mutation(s) within the areas targeted by this assay, and inadequate number of viral copies(<138 copies/mL). A negative result must be combined with clinical observations, patient history, and epidemiological information. The expected result is Negative.  Fact Sheet for Patients:  1208  Fact Sheet for Healthcare Providers:  11/05/20  This test is no t yet approved or cleared by the  [employer] FDA and  has been authorized for detection and/or diagnosis of SARS-CoV-2 by FDA under an Emergency Use Authorization (EUA). This EUA will remain  in effect (meaning this test can be used) for the duration of the COVID-19 declaration under Section 564(b)(1) of the Act, 21 U.S.C.section 360bbb-3(b)(1), unless the authorization is terminated  or revoked sooner.       Influenza A by PCR NEGATIVE NEGATIVE Final   Influenza B by PCR NEGATIVE NEGATIVE Final    Comment: (NOTE) The Xpert Xpress SARS-CoV-2/FLU/RSV plus assay is intended as an aid in the diagnosis of influenza from Nasopharyngeal swab specimens and should not be  used as a sole basis for treatment. Nasal washings and aspirates are unacceptable for Xpert Xpress SARS-CoV-2/FLU/RSV testing.  Fact Sheet for Patients: BloggerCourse.com  Fact Sheet for Healthcare Providers: SeriousBroker.it  This test is not yet approved or cleared by the [place] FDA and has been authorized for detection and/or diagnosis of SARS-CoV-2 by FDA under an Emergency Use Authorization (EUA). This EUA will remain in effect (meaning this test can be used) for the duration of the COVID-19 declaration under Section 564(b)(1) of the Act, 21 U.S.C. section 360bbb-3(b)(1), unless the authorization is terminated or revoked.  Performed at [facility], [address], [place], [place] [address]   Culture, blood (Routine X 2) w Reflex to ID Panel     Status: None (Preliminary result)   Collection Time: 11/04/20  4:40 AM   Specimen: BLOOD LEFT FOREARM  Result Value Ref Range Status   Specimen Description BLOOD LEFT FOREARM  Final   Special Requests   Final    BOTTLES DRAWN AEROBIC AND ANAEROBIC Blood Culture adequate volume   Culture   Final    NO GROWTH 2 DAYS Performed at [facility], [address] [place], [place] [address]    Report Status PENDING  Incomplete   Culture, blood (Routine X 2) w Reflex to ID Panel     Status: None (Preliminary result)   Collection Time: 11/04/20  4:45 AM   Specimen: BLOOD RIGHT WRIST  Result Value Ref Range Status   Specimen Description BLOOD RIGHT WRIST  Final   Special Requests   Final    BOTTLES DRAWN AEROBIC AND ANAEROBIC Blood Culture results may not be optimal due to an excessive volume of blood received in culture bottles   Culture   Final    NO GROWTH 2 DAYS Performed at [facility], [address], [place], [place] [address]    Report Status PENDING  Incomplete  C Difficile Quick Screen w PCR reflex     Status: None   Collection Time: 11/04/20  7:33 AM   Specimen: STOOL  Result Value Ref Range Status   C Diff antigen NEGATIVE NEGATIVE Final   C Diff toxin NEGATIVE NEGATIVE Final   C Diff interpretation No C. difficile detected.  Final    Comment: Performed at [facility], [address] [place], [place] [address]  Gastrointestinal Panel by PCR , Stool     Status: None   Collection Time: 11/04/20  7:33 AM   Specimen: Stool  Result Value Ref Range Status   Campylobacter species NOT DETECTED NOT DETECTED Final   Plesimonas shigelloides NOT DETECTED NOT DETECTED Final   Salmonella species NOT DETECTED NOT DETECTED Final   Yersinia enterocolitica NOT DETECTED NOT DETECTED Final   Vibrio species NOT DETECTED NOT DETECTED Final   Vibrio cholerae NOT DETECTED NOT DETECTED Final   Enteroaggregative E coli (EAEC) NOT DETECTED NOT DETECTED Final   Enteropathogenic E coli (EPEC) NOT DETECTED NOT DETECTED Final   Enterotoxigenic E coli (ETEC) NOT DETECTED NOT DETECTED Final   Shiga like toxin producing E coli (STEC) NOT DETECTED NOT DETECTED Final   Shigella/Enteroinvasive E coli (EIEC) NOT DETECTED NOT DETECTED Final   Cryptosporidium NOT DETECTED NOT DETECTED Final   Cyclospora cayetanensis NOT DETECTED NOT DETECTED Final   Entamoeba histolytica NOT DETECTED NOT DETECTED Final   Giardia  lamblia NOT DETECTED NOT DETECTED Final   Adenovirus F40/41 NOT DETECTED NOT DETECTED Final   Astrovirus NOT DETECTED  NOT DETECTED Final   Norovirus GI/GII NOT DETECTED NOT DETECTED Final   Rotavirus A NOT DETECTED NOT DETECTED Final   Sapovirus (I, II, IV, and V) NOT DETECTED NOT DETECTED Final    Comment: Performed at [facility], [address] [place], [place] [address]     Radiology Studies:  ECHOCARDIOGRAM COMPLETE  Result Date: 11/05/2020    ECHOCARDIOGRAM REPORT   Patient Name:   JENNIFR GAETA Date of Exam: 11/05/2020 Medical Rec #:  [id number]        Height:       64.0 in Accession #:    [id number]       Weight:       153.7 lb Date of Birth:  [birth year]        BSA:          1.749 m Patient Age:    74 years         BP:           104/54 mmHg Patient Gender: F                HR:           80 bpm. Exam Location:  Inpatient Procedure: 2D Echo, Cardiac Doppler and Color Doppler Indications:    I48.91* Unspeicified atrial fibrillation  History:        Patient has no prior history of Echocardiogram examinations.                 Arrythmias:Atrial Fibrillation; Risk Factors:Hypertension and                 Diabetes.  Sonographer:    Eulah Pont RDCS Referring Phys: [id number] BRIAN S CRENSHAW IMPRESSIONS  1. Left ventricular ejection fraction, by estimation, is 65 to 70%. The left ventricle has normal function. The left ventricle has no regional wall motion abnormalities. There is mild left ventricular hypertrophy of the basal-septal segment. Left ventricular diastolic parameters are consistent with Grade I diastolic dysfunction (impaired relaxation).  2. Right ventricular systolic function is normal. The right ventricular size is normal. There is normal pulmonary artery systolic pressure.  3. The mitral valve is normal in structure. No evidence of mitral valve regurgitation. No evidence of mitral stenosis.  4. The aortic valve is normal in structure. Aortic valve regurgitation is not visualized.  No aortic stenosis is present.  5. The inferior vena cava is normal in size with greater than 50% respiratory variability, suggesting right atrial pressure of 3 mmHg. FINDINGS  Left Ventricle: Left ventricular ejection fraction, by estimation, is 65 to 70%. The left ventricle has normal function. The left ventricle has no regional wall motion abnormalities. The left ventricular internal cavity size was normal in size. There is  mild left ventricular hypertrophy of the basal-septal segment. Left ventricular diastolic parameters are consistent with Grade I diastolic dysfunction (impaired relaxation). Normal left ventricular filling pressure. Right Ventricle: The right ventricular size is normal. No increase in right ventricular wall thickness. Right ventricular systolic function is normal. There is normal pulmonary artery systolic pressure. The tricuspid regurgitant velocity is 2.39 m/s, and  with an assumed right atrial pressure of 3 mmHg, the estimated right ventricular systolic pressure is 25.8 mmHg. Left Atrium: Left atrial size was normal in size. Right Atrium: Right atrial size was normal in size. Pericardium: There is no evidence of pericardial effusion. Mitral Valve: The mitral valve is normal in structure. Mild mitral annular calcification. No evidence of mitral valve regurgitation. No evidence  of mitral valve stenosis. Tricuspid Valve: The tricuspid valve is normal in structure. Tricuspid valve regurgitation is trivial. No evidence of tricuspid stenosis. Aortic Valve: The aortic valve is normal in structure. Aortic valve regurgitation is not visualized. No aortic stenosis is present. Pulmonic Valve: The pulmonic valve was normal in structure. Pulmonic valve regurgitation is not visualized. No evidence of pulmonic stenosis. Aorta: The aortic root is normal in size and structure. Venous: The inferior vena cava is normal in size with greater than 50% respiratory variability, suggesting right atrial pressure of  3 mmHg. IAS/Shunts: There is redundancy of the interatrial septum. No atrial level shunt detected by color flow Doppler.  LEFT VENTRICLE PLAX 2D LVIDd:         4.20 cm  Diastology LVIDs:         2.60 cm  LV e' medial:    4.50 cm/s LV PW:         1.00 cm  LV E/e' medial:  15.1 LV IVS:        1.20 cm  LV e' lateral:   7.43 cm/s LVOT diam:     2.00 cm  LV E/e' lateral: 9.1 LV SV:         86 LV SV Index:   49 LVOT Area:     3.14 cm  RIGHT VENTRICLE RV S prime:     12.40 cm/s TAPSE (M-mode): 2.6 cm LEFT ATRIUM             Index       RIGHT ATRIUM           Index LA diam:        3.10 cm 1.77 cm/m  RA Area:     11.50 cm LA Vol (A2C):   45.3 ml 25.90 ml/m RA Volume:   23.60 ml  13.49 ml/m LA Vol (A4C):   45.3 ml 25.90 ml/m LA Biplane Vol: 46.2 ml 26.41 ml/m  AORTIC VALVE LVOT Vmax:   137.00 cm/s LVOT Vmean:  97.500 cm/s LVOT VTI:    0.273 m  AORTA Ao Root diam: 3.20 cm Ao Asc diam:  3.10 cm MITRAL VALVE                TRICUSPID VALVE MV Area (PHT): 3.63 cm     TR Peak grad:   22.8 mmHg MV Decel Time: 209 msec     TR Vmax:        239.00 cm/s MV E velocity: 67.90 cm/s MV A velocity: 106.00 cm/s  SHUNTS MV E/A ratio:  0.64         Systemic VTI:  0.27 m                             Systemic Diam: 2.00 cm Armanda Magic MD Electronically signed by Armanda Magic MD Signature Date/Time: 11/05/2020/2:09:10 PM    Final      Scheduled Meds:   . insulin aspart  0-6 Units Subcutaneous Q6H  . rosuvastatin  20 mg Oral QHS  . saccharomyces boulardii  250 mg Oral BID  . thyroid  90 mg Oral Q0600    Continuous Infusions:   . lactated ringers 50 mL/hr at 11/06/20 1030     LOS: 3 days     Marcellus Scott, MD, [place], [facility]. [facility]    To contact the attending provider between 7A-7P or the covering provider during after hours 7P-7A, please log into the web site [email or web address]  and access using universal [place] password for that web site. If you do not have the password, please call the hospital  operator.  11/06/2020, 2:05 PM

## 2020-11-06 NOTE — Care Management Important Message (Signed)
Important Message  Patient Details  Name: Lore Polka MRN: [id number] Date of Birth: 05-Nov-1945   Medicare Important Message Given:  Yes     Dorena Bodo 11/06/2020, 3:15 PM

## 2020-11-07 DIAGNOSIS — R197 Diarrhea, unspecified: Secondary | ICD-10-CM | POA: Diagnosis not present

## 2020-11-07 DIAGNOSIS — R1031 Right lower quadrant pain: Secondary | ICD-10-CM

## 2020-11-07 LAB — CBC
HCT: 32.7 % — ABNORMAL LOW (ref 36.0–46.0)
Hemoglobin: 11.1 g/dL — ABNORMAL LOW (ref 12.0–15.0)
MCH: 29.5 pg (ref 26.0–34.0)
MCHC: 33.9 g/dL (ref 30.0–36.0)
MCV: 87 fL (ref 80.0–100.0)
Platelets: 265 10*3/uL (ref 150–400)
RBC: 3.76 MIL/uL — ABNORMAL LOW (ref 3.87–5.11)
RDW: 13.2 % (ref 11.5–15.5)
WBC: 13.1 10*3/uL — ABNORMAL HIGH (ref 4.0–10.5)
nRBC: 0 % (ref 0.0–0.2)

## 2020-11-07 LAB — GLUCOSE, CAPILLARY
Glucose-Capillary: 146 mg/dL — ABNORMAL HIGH (ref 70–99)
Glucose-Capillary: 155 mg/dL — ABNORMAL HIGH (ref 70–99)
Glucose-Capillary: 91 mg/dL (ref 70–99)
Glucose-Capillary: 97 mg/dL (ref 70–99)

## 2020-11-07 LAB — BASIC METABOLIC PANEL
Anion gap: 9 (ref 5–15)
BUN: 20 mg/dL (ref 8–23)
CO2: 18 mmol/L — ABNORMAL LOW (ref 22–32)
Calcium: 7.9 mg/dL — ABNORMAL LOW (ref 8.9–10.3)
Chloride: 108 mmol/L (ref 98–111)
Creatinine, Ser: 0.78 mg/dL (ref 0.44–1.00)
GFR, Estimated: >60 mL/min (ref >60)
Glucose, Bld: 92 mg/dL (ref 70–99)
Potassium: 4 mmol/L (ref 3.5–5.1)
Sodium: 135 mmol/L (ref 135–145)

## 2020-11-07 NOTE — Progress Notes (Signed)
HISTORY OF PRESENT ILLNESS:  Erin Mitchell is a 75 y.o. female admitted to the hospital with small bowel obstruction secondary to small bowel entrapment in an anterior abdominal wall hernia.  Manually reduced.  Has had incidental problems with intermittent loose stools of less than 2 weeks duration.  Has had abdominal pain since being admitted.  States she is feeling better today.  CT scan shows marked thickening of a segment of distal small bowel which was referred to as "enteritis".  No bowel entrapment in the hernia.  No colitis.  She is tolerating current diet.  Looks well.  Husband in room.  White blood cell count improved at 13.  REVIEW OF SYSTEMS:  All non-GI ROS negative.  Past Medical History:  Diagnosis Date  . Atrial fibrillation (HCC)   . Diabetes (HCC)   . Exposure to [employer]   . Hypertension   . Kidney disease   . Low thyroid stimulating hormone (TSH) level   . Thyroid disease     Past Surgical History:  Procedure Laterality Date  . APPENDECTOMY    . CESAREAN SECTION    . CHOLECYSTECTOMY    . HERNIA REPAIR    . 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 Her smoking use included cigarettes. She has a 0.25 pack-year smoking history. She has never used smokeless tobacco. She reports that she does not drink alcohol and does not use drugs.  family history includes Diabetes in her brother; Emphysema in her mother; Hypertension in her father.  No Known Allergies     PHYSICAL EXAMINATION: Vital signs: BP 130/68 (BP Location: Right Arm)   Pulse 80   Temp 98.4 F (36.9 C) (Oral)   Resp 16   Wt 72.8 kg   SpO2 99%   BMI 27.55 kg/m   Constitutional: generally well-appearing, no acute distress Psychiatric: alert and oriented x3, cooperative Eyes: extraocular movements intact, anicteric, conjunctiva pink Mouth: oral pharynx moist, no lesions Neck: supple no  lymphadenopathy Cardiovascular: heart regular rate and rhythm, no murmur Lungs: clear to auscultation bilaterally Abdomen: soft, tenderness in the region of the umbilical hernia as previous but less tender in the right lower quadrant, nondistended, no obvious ascites, no peritoneal signs, normal bowel sounds, no organomegaly Rectal: Omitted Extremities: no clubbing, cyanosis, or lower extremity edema bilaterally Skin: no lesions on visible extremities Neuro: No focal deficits.   ASSESSMENT:  1.  Small bowel obstruction secondary to entrapment of small bowel in an abdominal wall hernia.  Manually reduced.  No further evidence of obstruction on imaging. 2.  New thickened segment of small bowel ("enteritis") on CT.  This is almost certainly edema related to bowel entrapment.  Suspect transient ischemia.  This explains her pain after having had her hernia manually reduced.  Improving. 3.  Diarrhea.  Intermittent.  Not worrisome.  Noninflammatory.  Negative GI pathogen panel and C. difficile.   PLAN:  1.  Continue supportive care.  Advance diet as tolerated.  Monitor pain levels.  Ambulate.  Would be okay for discharge home when she is tolerating diet without significant pain. 2.  Supportive measures for diarrhea.  If this problem persists for significant amount of time post discharge, then she could be seen in our office for additional work-up.  However, not necessary at this time as we have seen her here and would recommend supportive measures at this point (as we have).  I have discussed this with the patient and her husband.  Please feel free to call for questions or problems.  We will sign off.  Wilhemina Bonito. Eda Keys., M.D. [facility] Division of Gastroenterology.

## 2020-11-07 NOTE — Progress Notes (Signed)
Progress Note  3 Days Post-Op  Subjective: Some abdominal soreness that feels more muscular, improving from yesterday. Bowel function improving some. Denies n/v. Would like to try some liquids this AM.   Objective: Vital signs in last 24 hours: Temp:  [98.2 F (36.8 C)-98.8 F (37.1 C)] 98.5 F (36.9 C) (06/04 0514) Pulse Rate:  [71-80] 77 (06/04 0514) Resp:  [16-18] 17 (06/04 0514) BP: (108-142)/(55-69) 108/61 (06/04 0514) SpO2:  [96 %-100 %] 96 % (06/04 0514) Weight:  [72.8 kg] 72.8 kg (06/04 0500) Last BM Date: 11/06/20  Intake/Output from previous day: 06/03 0701 - 06/04 0700 In: 404.3 [P.O.:180; I.V.:224.3] Out: -  Intake/Output this shift: No intake/output data recorded.  PE: General: pleasant, WD, overweight female who is laying in bed in NAD Heart: regular, rate, and rhythm.   Lungs: Respiratory effort nonlabored Abd: soft, NT, ND, +BS, reducible ventral hernia  Psych: A&Ox3 with an appropriate affect.    Lab Results:  Recent Labs    11/06/20 0103 11/07/20 0120  WBC 16.2* 13.1*  HGB 10.8* 11.1*  HCT 33.4* 32.7*  PLT 267 265   BMET Recent Labs    11/06/20 0103 11/07/20 0120  NA 135 135  K 3.3* 4.0  CL 108 108  CO2 21* 18*  GLUCOSE 155* 92  BUN 26* 20  CREATININE 0.90 0.78  CALCIUM 8.0* 7.9*   PT/INR No results for input(s): LABPROT, INR in the last 72 hours. CMP     Component Value Date/Time   NA 135 11/07/2020 0120   K 4.0 11/07/2020 0120   CL 108 11/07/2020 0120   CO2 18 (L) 11/07/2020 0120   GLUCOSE 92 11/07/2020 0120   BUN 20 11/07/2020 0120   CREATININE 0.78 11/07/2020 0120   CREATININE 0.88 11/03/2020 1139   CALCIUM 7.9 (L) 11/07/2020 0120   PROT 6.3 (L) 11/04/2020 0454   ALBUMIN 3.5 11/04/2020 0454   AST 19 11/04/2020 0454   ALT 19 11/04/2020 0454   ALKPHOS 71 11/04/2020 0454   BILITOT 1.1 11/04/2020 0454   GFRNONAA >60 11/07/2020 0120   GFRNONAA 74 10/08/2020 0000   GFRAA 85 10/08/2020 0000   Lipase     Component  Value Date/Time   LIPASE 32 11/03/2020 1820       Studies/Results: CT ABDOMEN PELVIS WO CONTRAST  Result Date: 11/06/2020 CLINICAL DATA:  Abdominal pain.  Hernia suspected. EXAM: CT ABDOMEN AND PELVIS WITHOUT CONTRAST TECHNIQUE: Multidetector CT imaging of the abdomen and pelvis was performed following the standard protocol without IV contrast. COMPARISON:  11/03/2020 FINDINGS: Lower chest: Lung bases are clear. Hepatobiliary: New small amount of perihepatic ascites. Gallbladder has been removed. Pancreas: Unremarkable. No pancreatic ductal dilatation or surrounding inflammatory changes. Spleen: New perisplenic ascites. 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 No splenic enlargement. Adrenals/Urinary Tract: Normal appearance of the adrenal glands. No kidney stones or hydronephrosis. Small amount of fluid in the urinary bladder. Stomach/Bowel: Colon is decompressed. Evidence for diverticular changes in the sigmoid colon and limited evaluation of the sigmoid colon on sequence 3, image 62. There is oral contrast in the right colon. Normal appearance of the stomach without distension. Possible duodenal diverticulum near the second portion of the duodenum. Dilated loops of small bowel in the abdomen measuring up to 2.7 cm. Extensive wall thickening involving loops of distal small bowel in the ileum. Small bowel wall thickening measures up to 1.0 cm. Oral  contrast within the distal small bowel. Mesenteric edema. Vascular/Lymphatic: Atherosclerotic calcifications in the aorta and iliac arteries without aneurysm. No lymph node enlargement in the abdomen or pelvis. Reproductive: Small focus of gas at the uterine fundus is similar to the recent comparison examination. Small amount of fluid around the uterine fundus. Again noted is a low-density structure in the right adnexa that measures roughly 3.3 cm.  Other: Again noted is a periumbilical ventral hernia. There is no longer bowel within this ventral hernia. The ventral hernia now contains fat with mild stranding. Small amount of free fluid in the lower abdomen and pelvis. Musculoskeletal: No acute bone abnormality. IMPRESSION: 1. Severe bowel wall thickening involving the distal small bowel including the terminal ileum. This represents a significant change from the exam on 11/03/2020. Findings are suggestive for enteritis. New ascites and mesenteric stranding associated with the bowel inflammation. 2. Ventral hernia no longer contains small bowel. 3. Mild distention of small bowel but there is oral contrast in the right colon. 4. Persistent unusual findings involving the uterus and right adnexa in a patient of this age. Again noted is a small focus of gas within the uterus and low-density right adnexal/ovarian structure. These areas may be better characterized with ultrasound. 5. 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 Recommend annual cross-sectional surveillance. These results will be called to the ordering clinician or representative by the Radiologist Assistant, and communication documented in the PACS or [employer]. Electronically Signed   By: Richarda Overlie M.D.   On: 11/06/2020 17:49   ECHOCARDIOGRAM COMPLETE  Result Date: 11/05/2020    ECHOCARDIOGRAM REPORT   Patient Name:   ISABELLE MATT Date of Exam: 11/05/2020 Medical Rec #:  [id number]        Height:       64.0 in Accession #:    [id number]       Weight:       153.7 lb Date of Birth:  22-Jul-1945        BSA:          1.749 m Patient Age:    75 years         BP:           104/54 mmHg Patient Gender: F                HR:           80 bpm. Exam Location:  Inpatient Procedure: 2D Echo, Cardiac Doppler and Color Doppler Indications:    I48.91* Unspeicified atrial fibrillation  History:        Patient has no prior history of Echocardiogram examinations.                  Arrythmias:Atrial Fibrillation; Risk Factors:Hypertension and                 Diabetes.  Sonographer:    Eulah Pont RDCS Referring Phys: [id number] BRIAN S CRENSHAW IMPRESSIONS  1. Left ventricular ejection fraction, by estimation, is 65 to 70%. The left ventricle has normal function. The left ventricle has no regional wall motion abnormalities. There is mild left ventricular hypertrophy of the basal-septal segment. Left ventricular diastolic parameters are consistent with Grade I diastolic dysfunction (impaired relaxation).  2. Right ventricular systolic function is normal. The right ventricular size is normal. There is normal pulmonary artery systolic pressure.  3. The mitral valve is normal in structure. No evidence of mitral valve regurgitation. No evidence of mitral  stenosis.  4. The aortic valve is normal in structure. Aortic valve regurgitation is not visualized. No aortic stenosis is present.  5. The inferior vena cava is normal in size with greater than 50% respiratory variability, suggesting right atrial pressure of 3 mmHg. FINDINGS  Left Ventricle: Left ventricular ejection fraction, by estimation, is 65 to 70%. The left ventricle has normal function. The left ventricle has no regional wall motion abnormalities. The left ventricular internal cavity size was normal in size. There is  mild left ventricular hypertrophy of the basal-septal segment. Left ventricular diastolic parameters are consistent with Grade I diastolic dysfunction (impaired relaxation). Normal left ventricular filling pressure. Right Ventricle: The right ventricular size is normal. No increase in right ventricular wall thickness. Right ventricular systolic function is normal. There is normal pulmonary artery systolic pressure. The tricuspid regurgitant velocity is 2.39 m/s, and  with an assumed right atrial pressure of 3 mmHg, the estimated right ventricular systolic pressure is 25.8 mmHg. Left Atrium: Left atrial size was normal in  size. Right Atrium: Right atrial size was normal in size. Pericardium: There is no evidence of pericardial effusion. Mitral Valve: The mitral valve is normal in structure. Mild mitral annular calcification. No evidence of mitral valve regurgitation. No evidence of mitral valve stenosis. Tricuspid Valve: The tricuspid valve is normal in structure. Tricuspid valve regurgitation is trivial. No evidence of tricuspid stenosis. Aortic Valve: The aortic valve is normal in structure. Aortic valve regurgitation is not visualized. No aortic stenosis is present. Pulmonic Valve: The pulmonic valve was normal in structure. Pulmonic valve regurgitation is not visualized. No evidence of pulmonic stenosis. Aorta: The aortic root is normal in size and structure. Venous: The inferior vena cava is normal in size with greater than 50% respiratory variability, suggesting right atrial pressure of 3 mmHg. IAS/Shunts: There is redundancy of the interatrial septum. No atrial level shunt detected by color flow Doppler.  LEFT VENTRICLE PLAX 2D LVIDd:         4.20 cm  Diastology LVIDs:         2.60 cm  LV e' medial:    4.50 cm/s LV PW:         1.00 cm  LV E/e' medial:  15.1 LV IVS:        1.20 cm  LV e' lateral:   7.43 cm/s LVOT diam:     2.00 cm  LV E/e' lateral: 9.1 LV SV:         86 LV SV Index:   49 LVOT Area:     3.14 cm  RIGHT VENTRICLE RV S prime:     12.40 cm/s TAPSE (M-mode): 2.6 cm LEFT ATRIUM             Index       RIGHT ATRIUM           Index LA diam:        3.10 cm 1.77 cm/m  RA Area:     11.50 cm LA Vol (A2C):   45.3 ml 25.90 ml/m RA Volume:   23.60 ml  13.49 ml/m LA Vol (A4C):   45.3 ml 25.90 ml/m LA Biplane Vol: 46.2 ml 26.41 ml/m  AORTIC VALVE LVOT Vmax:   137.00 cm/s LVOT Vmean:  97.500 cm/s LVOT VTI:    0.273 m  AORTA Ao Root diam: 3.20 cm Ao Asc diam:  3.10 cm MITRAL VALVE                TRICUSPID VALVE MV  Area (PHT): 3.63 cm     TR Peak grad:   22.8 mmHg MV Decel Time: 209 msec     TR Vmax:        239.00 cm/s MV E  velocity: 67.90 cm/s MV A velocity: 106.00 cm/s  SHUNTS MV E/A ratio:  0.64         Systemic VTI:  0.27 m                             Systemic Diam: 2.00 cm Armanda Magic MD Electronically signed by Armanda Magic MD Signature Date/Time: 11/05/2020/2:09:10 PM    Final     Anti-infectives: Anti-infectives (From admission, onward)   Start     Dose/Rate Route Frequency Ordered Stop   11/05/20 0400  cefTRIAXone (ROCEPHIN) 1 g in sodium chloride 0.9 % 100 mL IVPB  Status:  Discontinued        1 g 200 mL/hr over 30 Minutes Intravenous Every 24 hours 11/04/20 0402 11/04/20 1218   11/04/20 0445  cefTRIAXone (ROCEPHIN) 1 g in sodium chloride 0.9 % 100 mL IVPB        1 g 200 mL/hr over 30 Minutes Intravenous  Once 11/04/20 0439 11/04/20 0550       Assessment/Plan Incarcerated ventral hernia with SBO Diarrhea  - hernia was mechanically reduced by Dr. Bedelia Person on admission - she has had diarrhea - GI panel and C. Diff both negative, GI recommends continuing supportive treatment  - CT yesterday shows reduced ventral hernia and some thickened distal small bowel but contrast in colon - patient reports clinically she is feeling better this AM - abdominal binder when OOB - restart liquid today and see how she tolerates - if hernia becomes incarcerated or strangulated or patient is unable to tolerate PO intake may require surgical intervention. I am hopeful that abdominal symptoms continue to improve and hernia remains reducible and we can plan to allow patient to get over diarrheal illness and have hernia repaired electively. We will continue to follow   FEN: FLD  VTE: continue to hold plavix ID: no current abx, leukocytosis improving   - below per primary attending -  Hypothyroidism HTN Atrial fibrillation - plavix on hold, cards recommending a Lexiscan nuclear study for risk stratification preoperatively  Peripheral vascular disease AKI T2DM  LOS: 4 days    Juliet Rude, [facility] Surgery 11/07/2020, 9:33 AM Please see Amion for pager number during day hours 7:00am-4:30pm

## 2020-11-07 NOTE — Progress Notes (Signed)
PROGRESS NOTE   Erin Mitchell  WUJ:[id number]    DOB: [birth year]    DOA: 11/03/2020  PCP: Everrett Coombe, DO   I have briefly reviewed patients previous medical records in [facility].  Chief Complaint  Patient presents with  . Abdominal Pain    Brief Narrative:   75 y.o. female with medical history significant for chronic/recurrent ventral abdominal hernia, type 2 diabetes mellitus, acquired hypothyroidism, essential hypertension, paroxysmal atrial fibrillation not on chronic formal anticoagulation, who is admitted to [facility] on 11/03/2020 with small bowel obstruction in the setting of incarcerated ventral abdominal hernia after presenting from home to [facility] ED complaining of abdominal pain. Ventral hernia reduced in the ED.  Briefly improved then worsened diarrhea and some abdominal pain.  Repeated CT abdomen 6/3, noted severe enteritis (could be bowel wall edema) but no further evidence of bowel obstruction.  GI recommends supportive care and signed off.  General surgery hopeful for DC and elective hernia repair as outpatient.   Assessment & Plan:  Principal Problem:   SBO (small bowel obstruction) (HCC) Active Problems:   PAF (paroxysmal atrial fibrillation) (HCC)   Acquired hypothyroidism   Incarcerated ventral hernia   Nausea   Abdominal pain   AKI (acute kidney injury) (HCC)   Leukocytosis   Acute cystitis   Hypertension   Diabetes (HCC)   Incarcerated ventral incisional hernia with small bowel obstruction: General surgery consulted.  Treated supportively with NPO, NG tube to low wall suction, IV fluids.  Clinically improved.  Ventral hernia mechanically reduced in the ED.  Briefly improved then worsened diarrhea, some abdominal pain and recurrent partially reducible ventral hernia.  Repeated CT abdomen 6/3, noted severe enteritis (could be bowel wall edema) but no further evidence of bowel obstruction.  Clinically improving today.  Initiated liquid diet and  advance as tolerated.  General surgery prefers DC, improvement in acute diarrheal illness and elective outpatient laparoscopic hernia repair after cardiac nuclear stress testing.  However if urgent surgery becomes necessary, Cardiology has cleared for procedure without nuclear stress test.  2D echo shows LVEF 65-70%.  Plavix has been on hold since admission.  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  Current diarrhea preceded her SBO by about 9 to 10 days.  No recent antibiotic exposure.  C. difficile and GI panel PCR negative.  CT abdomen 6/3 suggests "severe enteritis" which could be due to bowel wall edema.  GI has seen him again today, recommends continued supportive care and if diarrhea persists for significant amount of time post discharge and outpatient follow-up in their office for additional work-up.  Advance diet as tolerated.  Continue probiotics.  Acute kidney injury: Likely multifactorial due to prerenal from GI losses, poor oral intake along with lisinopril use.  Intermittent soft blood pressures and ATN also possible.  Presented with creatinine of 0.88 which rapidly increased to 2.01.  Resolved.  Continue gentle IVF.  Anion gap metabolic acidosis: Due to GI losses.  Resolved.  Hypokalemia: Replaced.  Dilutional anemia: No blood loss.  Stable.  Asymptomatic bacteriuria: Patient denies UTI symptoms, has had UTI in the past.  Discontinued empirically started ceftriaxone.  Type II DM with proteinuria/nephropathy: Holding metformin.  SSI for now.  Reasonable inpatient control.  Hold lisinopril due to AKI.  Acquired hypothyroidism: Continue thyroid [place]  Essential hypertension: Holding  lisinopril due to AKI and Lopressor due to soft or normal blood pressures.  Resume Lopressor when able.   Normotensive.  Paroxysmal A. fib: CHA2DS2-VASc score of 3.  As per detailed discussion with patient, she had a single episode of A. fib post vascular procedure in [place] last year, extensively evaluated by cardiology including Holter monitoring which did not show any further A. fib and even had a TEE due to concerns for endocarditis which was ruled out.  Patient likely not on anticoagulation due to single postprocedure A. fib episode.  Metoprolol discussion as noted above.  Plavix currently on hold.  Cardiology input appreciated.  2D echo with normal EF.  Remains in sinus rhythm.  Incidental CT abdomen findings 11/03/2020, outpatient follow-up Moderate diffuse colonic diverticulosis. Fluid and gas mildly distends the uterine cavity, nonspecific.  Pelvic ultrasound as outpatient Simple 3.1 cm right adnexal cyst, recommend follow-up ultrasound in 6 to 12 months.   Body mass index is 27.55 kg/m.    DVT prophylaxis: SCDs Start: 11/03/20 2017     Code Status: DNR Family Communication: None at bedside.  Patient again declined my offer to update spouse regarding her condition.  She stated that she has been informing him. Disposition:  Status is: Inpatient  Remains inpatient appropriate because:Inpatient level of care appropriate due to severity of illness   Dispo: The patient is from: Home              Anticipated d/c is to: Home hopefully in the next 1 to 2 days.              Patient currently is not medically stable to d/c.          Consultants:   General surgery Cardiology [facility].  Procedures:   NG tube- discontinued 6/1  Antimicrobials:    Anti-infectives (From admission, onward)   Start     Dose/Rate Route Frequency Ordered Stop   11/05/20 0400  cefTRIAXone (ROCEPHIN) 1 g in sodium chloride 0.9 % 100 mL IVPB  Status:  Discontinued        1 g 200 mL/hr over 30 Minutes Intravenous Every 24 hours 11/04/20 0402 11/04/20 1218   11/04/20 0445  cefTRIAXone (ROCEPHIN) 1 g  in sodium chloride 0.9 % 100 mL IVPB        1 g 200 mL/hr over 30 Minutes Intravenous  Once 11/04/20 0439 11/04/20 0550        Subjective:  Had minimal diarrhea yesterday, 2-3 episodes, small-volume, soft stools.  Post CT oral contrast, had several episodes of moderate to large volume, watery diarrhea until early this morning and much improved since then.  Only to episodes of smears today.  Abdominal pain has also improved.  Objective:   Vitals:   11/07/20 0500 11/07/20 0514 11/07/20 1316 11/07/20 1454  BP:  108/61 130/68 133/72  Pulse:  77 80 82  Resp:  17 16 18   Temp:  98.5 F (36.9 C) 98.4 F (36.9 C) 98.2 F (36.8 C)  TempSrc:  Oral Oral Oral  SpO2:  96% 99%   Weight: 72.8 kg       General exam: Elderly female, moderately built and nourished, seen lying comfortably supine in bed.  Oral mucosa moist. Respiratory system: Clear to auscultation.  No increased work of breathing. Cardiovascular system: S1-S2 heard, RRR.  No JVD or murmurs.  No pedal edema.  Telemetry personally reviewed: Sinus rhythm. Gastrointestinal system: Abdomen is soft, nondistended and nontender.  No ventral hernia palpable today.  Normal bowel sounds heard. Central nervous system: Alert and oriented. No focal neurological deficits. Extremities: Symmetric 5 x 5 power. Skin: No rashes, lesions or ulcers Psychiatry: Judgement and insight appear normal. Mood & affect appropriate.     Data Reviewed:   I have personally reviewed following labs and imaging studies   CBC: Recent Labs  Lab 11/03/20 1139 11/03/20 1820 11/04/20 0454 11/05/20 0044 11/06/20 0103 11/07/20 0120  WBC 9.7 13.7* 16.0* 14.7* 16.2* 13.1*  NEUTROABS 6,809 9.6* 12.9*  --   --   --   HGB 12.4 13.4 11.8* 11.4* 10.8* 11.1*  HCT 39.7 40.4 35.9* 34.2* 33.4* 32.7*  MCV 88.2 87.4 88.2 86.6 88.6 87.0  PLT 314 321 300 269 267 265    Basic Metabolic Panel: Recent Labs  Lab 11/04/20 0454 11/05/20 0044 11/06/20 0103 11/07/20 0120   NA 136 135 135 135  K 4.2 3.6 3.3* 4.0  CL 102 102 108 108  CO2 17* 23 21* 18*  GLUCOSE 140* 113* 155* 92  BUN 39* 36* 26* 20  CREATININE 2.01* 1.35* 0.90 0.78  CALCIUM 8.9 8.3* 8.0* 7.9*  MG 1.7  --   --   --     Liver Function Tests: Recent Labs  Lab 11/03/20 1139 11/03/20 1820 11/04/20 0454  AST 20 25 19   ALT 18 20 19   ALKPHOS  --  75 71  BILITOT 0.5 0.8 1.1  PROT 6.5 7.3 6.3*  ALBUMIN  --  4.0 3.5    CBG: Recent Labs  Lab 11/07/20 0016 11/07/20 0544 11/07/20 1200  GLUCAP 91 97 155*    Microbiology Studies:   Recent Results (from the past 240 hour(s))  Resp Panel by RT-PCR (Flu A&B, Covid) Nasopharyngeal Swab     Status: None   Collection Time: 11/03/20  8:48 PM   Specimen: Nasopharyngeal Swab; Nasopharyngeal(NP) swabs in vial transport medium  Result Value Ref Range Status   SARS Coronavirus 2 by RT PCR NEGATIVE NEGATIVE Final    Comment: (NOTE) SARS-CoV-2 target nucleic acids are NOT DETECTED.  The SARS-CoV-2 RNA is generally detectable in upper respiratory specimens during the acute phase of infection. The lowest concentration of SARS-CoV-2 viral copies this assay can detect is 138 copies/mL. A negative result does not preclude SARS-Cov-2 infection and should not be used as the sole basis for treatment or other patient management decisions. A negative result may occur with  improper specimen collection/handling, submission of specimen other than nasopharyngeal swab, presence of viral mutation(s) within the areas targeted by this assay, and inadequate number of viral copies(<138 copies/mL). A negative result must be combined with clinical observations, patient history, and epidemiological information. The expected result is Negative.  Fact Sheet for Patients:  01/07/21  Fact Sheet for Healthcare Providers:  11/05/20  This test is no t yet approved or cleared by the [employer]  FDA and  has been authorized for detection and/or diagnosis of SARS-CoV-2 by FDA under an Emergency Use Authorization (EUA). This EUA will remain  in effect (meaning this test can be used) for the duration of the COVID-19 declaration under Section 564(b)(1) of the Act, 21 U.S.C.section 360bbb-3(b)(1), unless the authorization is terminated  or revoked sooner.       Influenza A by PCR NEGATIVE NEGATIVE Final   Influenza B by PCR NEGATIVE NEGATIVE Final    Comment: (NOTE) The Xpert Xpress SARS-CoV-2/FLU/RSV plus assay is intended as an aid in the diagnosis of influenza from Nasopharyngeal swab specimens and should not be  used as a sole basis for treatment. Nasal washings and aspirates are unacceptable for Xpert Xpress SARS-CoV-2/FLU/RSV testing.  Fact Sheet for Patients: BloggerCourse.comhttps://www.fda.gov/media/152166/download  Fact Sheet for Healthcare Providers: SeriousBroker.ithttps://www.fda.gov/media/152162/download  This test is not yet approved or cleared by the [place] FDA and has been authorized for detection and/or diagnosis of SARS-CoV-2 by FDA under an Emergency Use Authorization (EUA). This EUA will remain in effect (meaning this test can be used) for the duration of the COVID-19 declaration under Section 564(b)(1) of the Act, 21 U.S.C. section 360bbb-3(b)(1), unless the authorization is terminated or revoked.  Performed at [facility], [address] [place], [place] [address]   Culture, blood (Routine X 2) w Reflex to ID Panel     Status: None (Preliminary result)   Collection Time: 11/04/20  4:40 AM   Specimen: BLOOD LEFT FOREARM  Result Value Ref Range Status   Specimen Description BLOOD LEFT FOREARM  Final   Special Requests   Final    BOTTLES DRAWN AEROBIC AND ANAEROBIC Blood Culture adequate volume   Culture   Final    NO GROWTH 2 DAYS Performed at [facility], [address] [place], [place] [address]    Report Status PENDING  Incomplete  Culture, blood  (Routine X 2) w Reflex to ID Panel     Status: None (Preliminary result)   Collection Time: 11/04/20  4:45 AM   Specimen: BLOOD RIGHT WRIST  Result Value Ref Range Status   Specimen Description BLOOD RIGHT WRIST  Final   Special Requests   Final    BOTTLES DRAWN AEROBIC AND ANAEROBIC Blood Culture results may not be optimal due to an excessive volume of blood received in culture bottles   Culture   Final    NO GROWTH 2 DAYS Performed at [facility], [address] [place], [place] [address]    Report Status PENDING  Incomplete  C Difficile Quick Screen w PCR reflex     Status: None   Collection Time: 11/04/20  7:33 AM   Specimen: STOOL  Result Value Ref Range Status   C Diff antigen NEGATIVE NEGATIVE Final   C Diff toxin NEGATIVE NEGATIVE Final   C Diff interpretation No C. difficile detected.  Final    Comment: Performed at [facility], [address] [place], [place] [address]  Gastrointestinal Panel by PCR , Stool     Status: None   Collection Time: 11/04/20  7:33 AM   Specimen: Stool  Result Value Ref Range Status   Campylobacter species NOT DETECTED NOT DETECTED Final   Plesimonas shigelloides NOT DETECTED NOT DETECTED Final   Salmonella species NOT DETECTED NOT DETECTED Final   Yersinia enterocolitica NOT DETECTED NOT DETECTED Final   Vibrio species NOT DETECTED NOT DETECTED Final   Vibrio cholerae NOT DETECTED NOT DETECTED Final   Enteroaggregative E coli (EAEC) NOT DETECTED NOT DETECTED Final   Enteropathogenic E coli (EPEC) NOT DETECTED NOT DETECTED Final   Enterotoxigenic E coli (ETEC) NOT DETECTED NOT DETECTED Final   Shiga like toxin producing E coli (STEC) NOT DETECTED NOT DETECTED Final   Shigella/Enteroinvasive E coli (EIEC) NOT DETECTED NOT DETECTED Final   Cryptosporidium NOT DETECTED NOT DETECTED Final   Cyclospora cayetanensis NOT DETECTED NOT DETECTED Final   Entamoeba histolytica NOT DETECTED NOT DETECTED Final   Giardia lamblia NOT DETECTED  NOT DETECTED Final   Adenovirus F40/41 NOT DETECTED NOT DETECTED Final   Astrovirus NOT DETECTED  NOT DETECTED Final   Norovirus GI/GII NOT DETECTED NOT DETECTED Final   Rotavirus A NOT DETECTED NOT DETECTED Final   Sapovirus (I, II, IV, and V) NOT DETECTED NOT DETECTED Final    Comment: Performed at [facility], [address], [place], [place] [address]     Radiology Studies:  CT ABDOMEN PELVIS WO CONTRAST  Result Date: 11/06/2020 CLINICAL DATA:  Abdominal pain.  Hernia suspected. EXAM: CT ABDOMEN AND PELVIS WITHOUT CONTRAST TECHNIQUE: Multidetector CT imaging of the abdomen and pelvis was performed following the standard protocol without IV contrast. COMPARISON:  11/03/2020 FINDINGS: Lower chest: Lung bases are clear. Hepatobiliary: New small amount of perihepatic ascites. Gallbladder has been removed. Pancreas: Unremarkable. No pancreatic ductal dilatation or surrounding inflammatory changes. Spleen: New perisplenic ascites. 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 No splenic enlargement. Adrenals/Urinary Tract: Normal appearance of the adrenal glands. No kidney stones or hydronephrosis. Small amount of fluid in the urinary bladder. Stomach/Bowel: Colon is decompressed. Evidence for diverticular changes in the sigmoid colon and limited evaluation of the sigmoid colon on sequence 3, image 62. There is oral contrast in the right colon. Normal appearance of the stomach without distension. Possible duodenal diverticulum near the second portion of the duodenum. Dilated loops of small bowel in the abdomen measuring up to 2.7 cm. Extensive wall thickening involving loops of distal small bowel in the ileum. Small bowel wall thickening measures up to 1.0 cm. Oral contrast within the distal small bowel. Mesenteric edema. Vascular/Lymphatic: Atherosclerotic calcifications in the aorta  and iliac arteries without aneurysm. No lymph node enlargement in the abdomen or pelvis. Reproductive: Small focus of gas at the uterine fundus is similar to the recent comparison examination. Small amount of fluid around the uterine fundus. Again noted is a low-density structure in the right adnexa that measures roughly 3.3 cm. Other: Again noted is a periumbilical ventral hernia. There is no longer bowel within this ventral hernia. The ventral hernia now contains fat with mild stranding. Small amount of free fluid in the lower abdomen and pelvis. Musculoskeletal: No acute bone abnormality. IMPRESSION: 1. Severe bowel wall thickening involving the distal small bowel including the terminal ileum. This represents a significant change from the exam on 11/03/2020. Findings are suggestive for enteritis. New ascites and mesenteric stranding associated with the bowel inflammation. 2. Ventral hernia no longer contains small bowel. 3. Mild distention of small bowel but there is oral contrast in the right colon. 4. Persistent unusual findings involving the uterus and right adnexa in a patient of this age. Again noted is a small focus of gas within the uterus and low-density right adnexal/ovarian structure. These areas may be better characterized with ultrasound. 5. 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 Recommend annual cross-sectional surveillance. These results will be called to the ordering clinician or representative by the Radiologist Assistant, and communication documented in the PACS or [employer]. Electronically Signed   By: Richarda Overlie M.D.   On: 11/06/2020 17:49     Scheduled Meds:   . insulin aspart  0-6 Units Subcutaneous Q6H  . rosuvastatin  20 mg Oral QHS  . saccharomyces boulardii  250 mg Oral BID  . thyroid  90 mg Oral Q0600    Continuous Infusions:   . lactated ringers 50 mL/hr at 11/07/20 0608     LOS: 4 days  Marcellus Scott, MD, [place],  [facility]. [facility]    To contact the attending provider between 7A-7P or the covering provider during after hours 7P-7A, please log into the web site [email or web address] and access using universal [place] password for that web site. If you do not have the password, please call the hospital operator.  11/07/2020, 3:35 PM

## 2020-11-08 DIAGNOSIS — R197 Diarrhea, unspecified: Secondary | ICD-10-CM | POA: Diagnosis not present

## 2020-11-08 LAB — GLUCOSE, CAPILLARY
Glucose-Capillary: 101 mg/dL — ABNORMAL HIGH (ref 70–99)
Glucose-Capillary: 191 mg/dL — ABNORMAL HIGH (ref 70–99)
Glucose-Capillary: 249 mg/dL — ABNORMAL HIGH (ref 70–99)
Glucose-Capillary: 92 mg/dL (ref 70–99)

## 2020-11-08 LAB — BASIC METABOLIC PANEL
Anion gap: 7 (ref 5–15)
BUN: 13 mg/dL (ref 8–23)
CO2: 19 mmol/L — ABNORMAL LOW (ref 22–32)
Calcium: 7.9 mg/dL — ABNORMAL LOW (ref 8.9–10.3)
Chloride: 108 mmol/L (ref 98–111)
Creatinine, Ser: 0.66 mg/dL (ref 0.44–1.00)
GFR, Estimated: >60 mL/min (ref >60)
Glucose, Bld: 99 mg/dL (ref 70–99)
Potassium: 3.8 mmol/L (ref 3.5–5.1)
Sodium: 134 mmol/L — ABNORMAL LOW (ref 135–145)

## 2020-11-08 NOTE — Progress Notes (Signed)
PROGRESS NOTE   Erin Mitchell  ZOX:[id number]    DOB: April 04, 1946    DOA: 11/03/2020  PCP: Everrett Coombe, DO   I have briefly reviewed patients previous medical records in [facility].  Chief Complaint  Patient presents with  . Abdominal Pain    Brief Narrative:   75 y.o. female with medical history significant for chronic/recurrent ventral abdominal hernia, type 2 diabetes mellitus, acquired hypothyroidism, essential hypertension, paroxysmal atrial fibrillation not on chronic formal anticoagulation, who is admitted to [facility] on 11/03/2020 with small bowel obstruction in the setting of incarcerated ventral abdominal hernia after presenting from home to [facility] ED complaining of abdominal pain. Ventral hernia reduced in the ED.  Briefly improved then worsened diarrhea and some abdominal pain.  Repeated CT abdomen 6/3, noted severe enteritis (could be bowel wall edema) but no further evidence of bowel obstruction.  GI recommends supportive care and signed off.  General surgery hopeful for DC and elective hernia repair as outpatient.   Assessment & Plan:  Principal Problem:   SBO (small bowel obstruction) (HCC) Active Problems:   PAF (paroxysmal atrial fibrillation) (HCC)   Acquired hypothyroidism   Incarcerated ventral hernia   Nausea   Abdominal pain   AKI (acute kidney injury) (HCC)   Leukocytosis   Acute cystitis   Hypertension   Diabetes (HCC)   Incarcerated ventral incisional hernia with small bowel obstruction: General surgery consulted.  Treated supportively with NPO, NG tube to low wall suction, IV fluids.  Clinically improved.  Ventral hernia mechanically reduced in the ED.  Briefly improved then worsened diarrhea, some abdominal pain and recurrent partially reducible ventral hernia.  Repeated CT abdomen 6/3, noted severe enteritis (could be bowel wall edema) but no further evidence of bowel obstruction.  Clinically improving today.  Initiated liquid diet and  advance as tolerated.  General surgery prefers DC, improvement in acute diarrheal illness and elective outpatient laparoscopic hernia repair after cardiac nuclear stress testing.  However if urgent surgery becomes necessary, Cardiology has cleared for procedure without nuclear stress test.  2D echo shows LVEF 65-70%.  Plavix has been on hold since admission and resumed at discharge.  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  Current diarrhea preceded her SBO by about 9 to 10 days.  No recent antibiotic exposure.  C. difficile and GI panel PCR negative.  CT abdomen 6/3 suggests "severe enteritis" which could be due to bowel wall edema.  GI recommends continued supportive care and if diarrhea persists for significant amount of time post discharge and outpatient follow-up in their office for additional work-up.  Clinically much improved.  Advancing to soft diet today.  Continue probiotics.  Monitor overnight and if no worsening, possible discharge home tomorrow.  Acute kidney injury: Likely multifactorial due to prerenal from GI losses, poor oral intake along with lisinopril use.  Intermittent soft blood pressures and ATN also possible.  Presented with creatinine of 0.88 which rapidly increased to 2.01.  Resolved.  Discontinue IV fluids.  Anion gap metabolic acidosis: Due to GI losses.  Resolved.  Hypokalemia: Replaced.  Dilutional anemia: No blood loss.  Stable.  Asymptomatic bacteriuria: Patient denies UTI symptoms, has had UTI in the past.  Discontinued empirically started ceftriaxone.  Type II DM with proteinuria/nephropathy: Holding metformin.  SSI for now.  Reasonable inpatient control.  Hold lisinopril due to AKI.  Acquired hypothyroidism: Continue thyroid [place]  Essential hypertension: Holding lisinopril due to  AKI and Lopressor due to soft or normal blood pressures.  Resume Lopressor when able.  Normotensive.  Paroxysmal A. fib: CHA2DS2-VASc score of 3.  As per detailed discussion with patient, she had a single episode of A. fib [place] last year, extensively evaluated by cardiology including Holter monitoring which did not show any further A. fib and even had a TEE due to concerns for endocarditis which was ruled out.  Patient likely not on anticoagulation due to single postprocedure A. fib episode.  Metoprolol discussion as noted above.  Plavix currently on hold.  Cardiology input appreciated.  2D echo with normal EF.  Remains in sinus rhythm.  DC telemetry.  Incidental CT abdomen findings 11/03/2020, outpatient follow-up Moderate diffuse colonic diverticulosis. Fluid and gas mildly distends the uterine cavity, nonspecific.  Pelvic ultrasound as outpatient Simple 3.1 cm right adnexal cyst, recommend follow-up ultrasound in 6 to 12 months.   Body mass index is 27.55 kg/m.    DVT prophylaxis: SCDs Start: 11/03/20 2017     Code Status: DNR Family Communication: None at bedside.  Patient again declined my offer to update spouse regarding her condition.  She stated that she has been informing him. Disposition:  Status is: Inpatient  Remains inpatient appropriate because:Inpatient level of care appropriate due to severity of illness   Dispo: The patient is from: Home              Anticipated d/c is to: Home hopefully 6/6              Patient currently is not medically stable to d/c.          Consultants:   General surgery Cardiology [facility].  Procedures:   NG tube- discontinued 6/1  Antimicrobials:    Anti-infectives (From admission, onward)   Start     Dose/Rate Route Frequency Ordered Stop   11/05/20 0400  cefTRIAXone (ROCEPHIN) 1 g in sodium chloride 0.9 % 100 mL IVPB  Status:  Discontinued        1 g 200 mL/hr over 30 Minutes Intravenous Every 24  hours 11/04/20 0402 11/04/20 1218   11/04/20 0445  cefTRIAXone (ROCEPHIN) 1 g in sodium chloride 0.9 % 100 mL IVPB        1 g 200 mL/hr over 30 Minutes Intravenous  Once 11/04/20 0439 11/04/20 0550        Subjective:  Doing much better.  Had 2 BMs yesterday and none since 1 PM yesterday.  No abdominal pain.  Tolerating diet.  Ambulating comfortably.  Objective:   Vitals:   11/07/20 2037 11/08/20 0448 11/08/20 1040 11/08/20 1529  BP: (!) 171/80 (!) 154/71 (!) 150/80 139/78  Pulse: 82 76 81 94  Resp: 17 16 16 18   Temp: 98.3 F (36.8 C) 98.5 F (36.9 C) 97.7 F (36.5 C) 98.5 F (36.9 C)  TempSrc: Oral Oral Oral Oral  SpO2: 99% 96% 99% 97%  Weight:        General exam: Elderly female, moderately built and nourished, sitting up comfortably in chair Respiratory system: Clear to auscultation.  No increased work of breathing. Cardiovascular system: S1 and S2 heard, RRR.  No JVD, murmurs.  Trace bilateral ankle edema.   Gastrointestinal system: Abdomen is soft, nondistended and nontender.  Normal bowel sounds heard.  Did not appreciate ventral hernia today. Central nervous system: Alert and oriented. No  focal neurological deficits. Extremities: Symmetric 5 x 5 power. Skin: No rashes, lesions or ulcers Psychiatry: Judgement and insight appear normal. Mood & affect appropriate.     Data Reviewed:   I have personally reviewed following labs and imaging studies   CBC: Recent Labs  Lab 11/03/20 1139 11/03/20 1820 11/04/20 0454 11/05/20 0044 11/06/20 0103 11/07/20 0120  WBC 9.7 13.7* 16.0* 14.7* 16.2* 13.1*  NEUTROABS 6,809 9.6* 12.9*  --   --   --   HGB 12.4 13.4 11.8* 11.4* 10.8* 11.1*  HCT 39.7 40.4 35.9* 34.2* 33.4* 32.7*  MCV 88.2 87.4 88.2 86.6 88.6 87.0  PLT 314 321 300 269 267 265    Basic Metabolic Panel: Recent Labs  Lab 11/04/20 0454 11/05/20 0044 11/06/20 0103 11/07/20 0120 11/08/20 0123  NA 136   < > 135 135 134*  K 4.2   < > 3.3* 4.0 3.8  CL 102    < > 108 108 108  CO2 17*   < > 21* 18* 19*  GLUCOSE 140*   < > 155* 92 99  BUN 39*   < > 26* 20 13  CREATININE 2.01*   < > 0.90 0.78 0.66  CALCIUM 8.9   < > 8.0* 7.9* 7.9*  MG 1.7  --   --   --   --    < > = values in this interval not displayed.    Liver Function Tests: Recent Labs  Lab 11/03/20 1139 11/03/20 1820 11/04/20 0454  AST 20 25 19   ALT 18 20 19   ALKPHOS  --  75 71  BILITOT 0.5 0.8 1.1  PROT 6.5 7.3 6.3*  ALBUMIN  --  4.0 3.5    CBG: Recent Labs  Lab 11/08/20 0011 11/08/20 0549 11/08/20 1306  GLUCAP 101* 92 191*    Microbiology Studies:   Recent Results (from the past 240 hour(s))  Resp Panel by RT-PCR (Flu A&B, Covid) Nasopharyngeal Swab     Status: None   Collection Time: 11/03/20  8:48 PM   Specimen: Nasopharyngeal Swab; Nasopharyngeal(NP) swabs in vial transport medium  Result Value Ref Range Status   SARS Coronavirus 2 by RT PCR NEGATIVE NEGATIVE Final    Comment: (NOTE) SARS-CoV-2 target nucleic acids are NOT DETECTED.  The SARS-CoV-2 RNA is generally detectable in upper respiratory specimens during the acute phase of infection. The lowest concentration of SARS-CoV-2 viral copies this assay can detect is 138 copies/mL. A negative result does not preclude SARS-Cov-2 infection and should not be used as the sole basis for treatment or other patient management decisions. A negative result may occur with  improper specimen collection/handling, submission of specimen other than nasopharyngeal swab, presence of viral mutation(s) within the areas targeted by this assay, and inadequate number of viral copies(<138 copies/mL). A negative result must be combined with clinical observations, patient history, and epidemiological information. The expected result is Negative.  Fact Sheet for Patients:  01/08/21  Fact Sheet for Healthcare Providers:  11/05/20  This test is no t yet approved  or cleared by the [employer] FDA and  has been authorized for detection and/or diagnosis of SARS-CoV-2 by FDA under an Emergency Use Authorization (EUA). This EUA will remain  in effect (meaning this test can be used) for the duration of the COVID-19 declaration under Section 564(b)(1) of the Act, 21 U.S.C.section 360bbb-3(b)(1), unless the authorization is terminated  or revoked sooner.       Influenza A by PCR NEGATIVE NEGATIVE Final  Influenza B by PCR NEGATIVE NEGATIVE Final    Comment: (NOTE) The Xpert Xpress SARS-CoV-2/FLU/RSV plus assay is intended as an aid in the diagnosis of influenza from Nasopharyngeal swab specimens and should not be used as a sole basis for treatment. Nasal washings and aspirates are unacceptable for Xpert Xpress SARS-CoV-2/FLU/RSV testing.  Fact Sheet for Patients: [employer]com  Fact Sheet for Healthcare Providers: SeriousBroker.it  This test is not yet approved or cleared by the [place] FDA and has been authorized for detection and/or diagnosis of SARS-CoV-2 by FDA under an Emergency Use Authorization (EUA). This EUA will remain in effect (meaning this test can be used) for the duration of the COVID-19 declaration under Section 564(b)(1) of the Act, 21 U.S.C. section 360bbb-3(b)(1), unless the authorization is terminated or revoked.  Performed at [facility], [address], [place], [place] [address]   Culture, blood (Routine X 2) w Reflex to ID Panel     Status: None (Preliminary result)   Collection Time: 11/04/20  4:40 AM   Specimen: BLOOD LEFT FOREARM  Result Value Ref Range Status   Specimen Description BLOOD LEFT FOREARM  Final   Special Requests   Final    BOTTLES DRAWN AEROBIC AND ANAEROBIC Blood Culture adequate volume   Culture   Final    NO GROWTH 3 DAYS Performed at [facility], [address] [place], [place] [address]    Report Status PENDING   Incomplete  Culture, blood (Routine X 2) w Reflex to ID Panel     Status: None (Preliminary result)   Collection Time: 11/04/20  4:45 AM   Specimen: BLOOD RIGHT WRIST  Result Value Ref Range Status   Specimen Description BLOOD RIGHT WRIST  Final   Special Requests   Final    BOTTLES DRAWN AEROBIC AND ANAEROBIC Blood Culture results may not be optimal due to an excessive volume of blood received in culture bottles   Culture   Final    NO GROWTH 3 DAYS Performed at [facility], [address], [place], [place] [address]    Report Status PENDING  Incomplete  C Difficile Quick Screen w PCR reflex     Status: None   Collection Time: 11/04/20  7:33 AM   Specimen: STOOL  Result Value Ref Range Status   C Diff antigen NEGATIVE NEGATIVE Final   C Diff toxin NEGATIVE NEGATIVE Final   C Diff interpretation No C. difficile detected.  Final    Comment: Performed at [facility], [address] [place], [place] [address]  Gastrointestinal Panel by PCR , Stool     Status: None   Collection Time: 11/04/20  7:33 AM   Specimen: Stool  Result Value Ref Range Status   Campylobacter species NOT DETECTED NOT DETECTED Final   Plesimonas shigelloides NOT DETECTED NOT DETECTED Final   Salmonella species NOT DETECTED NOT DETECTED Final   Yersinia enterocolitica NOT DETECTED NOT DETECTED Final   Vibrio species NOT DETECTED NOT DETECTED Final   Vibrio cholerae NOT DETECTED NOT DETECTED Final   Enteroaggregative E coli (EAEC) NOT DETECTED NOT DETECTED Final   Enteropathogenic E coli (EPEC) NOT DETECTED NOT DETECTED Final   Enterotoxigenic E coli (ETEC) NOT DETECTED NOT DETECTED Final   Shiga like toxin producing E coli (STEC) NOT DETECTED NOT DETECTED Final   Shigella/Enteroinvasive E coli (EIEC) NOT DETECTED NOT DETECTED Final   Cryptosporidium NOT DETECTED NOT DETECTED Final   Cyclospora cayetanensis NOT DETECTED  NOT DETECTED Final   Entamoeba histolytica NOT DETECTED NOT DETECTED Final    Giardia lamblia NOT DETECTED NOT DETECTED Final   Adenovirus F40/41 NOT DETECTED NOT DETECTED Final   Astrovirus NOT DETECTED NOT DETECTED Final   Norovirus GI/GII NOT DETECTED NOT DETECTED Final   Rotavirus A NOT DETECTED NOT DETECTED Final   Sapovirus (I, II, IV, and V) NOT DETECTED NOT DETECTED Final    Comment: Performed at [facility], [address], [place], [place] [address]     Radiology Studies:  CT ABDOMEN PELVIS WO CONTRAST  Result Date: 11/06/2020 CLINICAL DATA:  Abdominal pain.  Hernia suspected. EXAM: CT ABDOMEN AND PELVIS WITHOUT CONTRAST TECHNIQUE: Multidetector CT imaging of the abdomen and pelvis was performed following the standard protocol without IV contrast. COMPARISON:  11/03/2020 FINDINGS: Lower chest: Lung bases are clear. Hepatobiliary: New small amount of perihepatic ascites. Gallbladder has been removed. Pancreas: Unremarkable. No pancreatic ductal dilatation or surrounding inflammatory changes. Spleen: New perisplenic ascites. 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 No splenic enlargement. Adrenals/Urinary Tract: Normal appearance of the adrenal glands. No kidney stones or hydronephrosis. Small amount of fluid in the urinary bladder. Stomach/Bowel: Colon is decompressed. Evidence for diverticular changes in the sigmoid colon and limited evaluation of the sigmoid colon on sequence 3, image 62. There is oral contrast in the right colon. Normal appearance of the stomach without distension. Possible duodenal diverticulum near the second portion of the duodenum. Dilated loops of small bowel in the abdomen measuring up to 2.7 cm. Extensive wall thickening involving loops of distal small bowel in the ileum. Small bowel wall thickening measures up to 1.0 cm. Oral contrast within the distal small bowel. Mesenteric edema. Vascular/Lymphatic: Atherosclerotic  calcifications in the aorta and iliac arteries without aneurysm. No lymph node enlargement in the abdomen or pelvis. Reproductive: Small focus of gas at the uterine fundus is similar to the recent comparison examination. Small amount of fluid around the uterine fundus. Again noted is a low-density structure in the right adnexa that measures roughly 3.3 cm. Other: Again noted is a periumbilical ventral hernia. There is no longer bowel within this ventral hernia. The ventral hernia now contains fat with mild stranding. Small amount of free fluid in the lower abdomen and pelvis. Musculoskeletal: No acute bone abnormality. IMPRESSION: 1. Severe bowel wall thickening involving the distal small bowel including the terminal ileum. This represents a significant change from the exam on 11/03/2020. Findings are suggestive for enteritis. New ascites and mesenteric stranding associated with the bowel inflammation. 2. Ventral hernia no longer contains small bowel. 3. Mild distention of small bowel but there is oral contrast in the right colon. 4. Persistent unusual findings involving the uterus and right adnexa in a patient of this age. Again noted is a small focus of gas within the uterus and low-density right adnexal/ovarian structure. These areas may be better characterized with ultrasound. 5. 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 Recommend annual cross-sectional surveillance. These results will be called to the ordering clinician or representative by the Radiologist Assistant, and communication documented in the PACS or [employer]. Electronically Signed   By: Richarda Overlie M.D.   On: 11/06/2020 17:49     Scheduled Meds:   . insulin aspart  0-6 Units Subcutaneous Q6H  . rosuvastatin  20 mg Oral QHS  . saccharomyces boulardii  250  mg Oral BID  . thyroid  90 mg Oral Q0600    Continuous Infusions:   . lactated ringers 50 mL/hr at 11/07/20 0608     LOS: 5 days      Marcellus Scott, MD, [place], [facility]. [facility]    To contact the attending provider between 7A-7P or the covering provider during after hours 7P-7A, please log into the web site [email or web address] and access using universal [place] password for that web site. If you do not have the password, please call the hospital operator.  11/08/2020, 3:53 PM

## 2020-11-08 NOTE — Progress Notes (Signed)
Progress Note  4 Days Post-Op  Subjective: Patient reports abdominal pain is minimal and improving. Denies nausea or vomiting. Had 2 BM yesterday. Hernia has not been out or stuck out.   Objective: Vital signs in last 24 hours: Temp:  [97.7 F (36.5 C)-98.5 F (36.9 C)] 97.7 F (36.5 C) (06/05 1040) Pulse Rate:  [76-82] 81 (06/05 1040) Resp:  [16-18] 16 (06/05 1040) BP: (130-171)/(68-80) 150/80 (06/05 1040) SpO2:  [96 %-99 %] 99 % (06/05 1040) Last BM Date: 11/06/20  Intake/Output from previous day: 06/04 0701 - 06/05 0700 In: 1420 [P.O.:1420] Out: -  Intake/Output this shift: Total I/O In: 720 [P.O.:720] Out: -   PE: General: pleasant, WD,overweightfemale who is laying in bed in NAD Heart: regular, rate, and rhythm.  Lungs: Respiratory effort nonlabored Abd: soft, NT, ND, +BS,reducible ventral hernia Psych: A&Ox3 with an appropriate affect.    Lab Results:  Recent Labs    11/06/20 0103 11/07/20 0120  WBC 16.2* 13.1*  HGB 10.8* 11.1*  HCT 33.4* 32.7*  PLT 267 265   BMET Recent Labs    11/07/20 0120 11/08/20 0123  NA 135 134*  K 4.0 3.8  CL 108 108  CO2 18* 19*  GLUCOSE 92 99  BUN 20 13  CREATININE 0.78 0.66  CALCIUM 7.9* 7.9*   PT/INR No results for input(s): LABPROT, INR in the last 72 hours. CMP     Component Value Date/Time   NA 134 (L) 11/08/2020 0123   K 3.8 11/08/2020 0123   CL 108 11/08/2020 0123   CO2 19 (L) 11/08/2020 0123   GLUCOSE 99 11/08/2020 0123   BUN 13 11/08/2020 0123   CREATININE 0.66 11/08/2020 0123   CREATININE 0.88 11/03/2020 1139   CALCIUM 7.9 (L) 11/08/2020 0123   PROT 6.3 (L) 11/04/2020 0454   ALBUMIN 3.5 11/04/2020 0454   AST 19 11/04/2020 0454   ALT 19 11/04/2020 0454   ALKPHOS 71 11/04/2020 0454   BILITOT 1.1 11/04/2020 0454   GFRNONAA >60 11/08/2020 0123   GFRNONAA 74 10/08/2020 0000   GFRAA 85 10/08/2020 0000   Lipase     Component Value Date/Time   LIPASE 32 11/03/2020 1820        Studies/Results: CT ABDOMEN PELVIS WO CONTRAST  Result Date: 11/06/2020 CLINICAL DATA:  Abdominal pain.  Hernia suspected. EXAM: CT ABDOMEN AND PELVIS WITHOUT CONTRAST TECHNIQUE: Multidetector CT imaging of the abdomen and pelvis was performed following the standard protocol without IV contrast. COMPARISON:  11/03/2020 FINDINGS: Lower chest: Lung bases are clear. Hepatobiliary: New small amount of perihepatic ascites. Gallbladder has been removed. Pancreas: Unremarkable. No pancreatic ductal dilatation or surrounding inflammatory changes. Spleen: New perisplenic ascites. 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 No splenic enlargement. Adrenals/Urinary Tract: Normal appearance of the adrenal glands. No kidney stones or hydronephrosis. Small amount of fluid in the urinary bladder. Stomach/Bowel: Colon is decompressed. Evidence for diverticular changes in the sigmoid colon and limited evaluation of the sigmoid colon on sequence 3, image 62. There is oral contrast in the right colon. Normal appearance of the stomach without distension. Possible duodenal diverticulum near the second portion of the duodenum. Dilated loops of small bowel in the abdomen measuring up to 2.7 cm. Extensive wall thickening involving loops of distal small bowel in the ileum. Small bowel wall thickening measures up to 1.0 cm. Oral contrast within the distal small bowel. Mesenteric edema. Vascular/Lymphatic:  Atherosclerotic calcifications in the aorta and iliac arteries without aneurysm. No lymph node enlargement in the abdomen or pelvis. Reproductive: Small focus of gas at the uterine fundus is similar to the recent comparison examination. Small amount of fluid around the uterine fundus. Again noted is a low-density structure in the right adnexa that measures roughly 3.3 cm. Other: Again noted is a periumbilical  ventral hernia. There is no longer bowel within this ventral hernia. The ventral hernia now contains fat with mild stranding. Small amount of free fluid in the lower abdomen and pelvis. Musculoskeletal: No acute bone abnormality. IMPRESSION: 1. Severe bowel wall thickening involving the distal small bowel including the terminal ileum. This represents a significant change from the exam on 11/03/2020. Findings are suggestive for enteritis. New ascites and mesenteric stranding associated with the bowel inflammation. 2. Ventral hernia no longer contains small bowel. 3. Mild distention of small bowel but there is oral contrast in the right colon. 4. Persistent unusual findings involving the uterus and right adnexa in a patient of this age. Again noted is a small focus of gas within the uterus and low-density right adnexal/ovarian structure. These areas may be better characterized with ultrasound. 5. 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 Recommend annual cross-sectional surveillance. These results will be called to the ordering clinician or representative by the Radiologist Assistant, and communication documented in the PACS or [employer]. Electronically Signed   By: Richarda Overlie M.D.   On: 11/06/2020 17:49    Anti-infectives: Anti-infectives (From admission, onward)   Start     Dose/Rate Route Frequency Ordered Stop   11/05/20 0400  cefTRIAXone (ROCEPHIN) 1 g in sodium chloride 0.9 % 100 mL IVPB  Status:  Discontinued        1 g 200 mL/hr over 30 Minutes Intravenous Every 24 hours 11/04/20 0402 11/04/20 1218   11/04/20 0445  cefTRIAXone (ROCEPHIN) 1 g in sodium chloride 0.9 % 100 mL IVPB        1 g 200 mL/hr over 30 Minutes Intravenous  Once 11/04/20 0439 11/04/20 0550       Assessment/Plan Incarcerated ventral hernia with SBO Diarrhea  - hernia was mechanically reduced by Dr. Bedelia Person on admission - she has had diarrhea - GI paneland C. Diff both  negative, GI recommends continuing supportive treatment  -CT yesterday shows reduced ventral hernia and some thickened distal small bowel but contrast in colon - patient reports clinically she is feeling better this AM - abdominal binder when OOB - increase to soft diet today and watch overnight - if hernia becomes incarcerated or strangulated or patient is unable to tolerate PO intake may require surgical intervention. I am hopeful that abdominal symptoms continue to improve and hernia remains reducible and we can plan to allow patient to get over diarrheal illness and have hernia repaired electively. We will continue to follow   FEN: soft diet   VTE: continue to hold plavix - likely resume tomorrow if tolerating diet ID: no current abx, leukocytosis improving   - below per primary attending -  Hypothyroidism HTN Atrial fibrillation - plavix on hold,cards recommendinga Lexiscan nuclear study for risk stratification preoperatively Peripheral vascular disease AKI T2DM  LOS: 5 days    Juliet Rude, [facility] 11/08/2020, 11:10 AM Please see Amion for pager number during day hours 7:00am-4:30pm

## 2020-11-09 ENCOUNTER — Other Ambulatory Visit: Payer: Self-pay | Admitting: Medical

## 2020-11-09 ENCOUNTER — Telehealth (HOSPITAL_COMMUNITY): Payer: Self-pay | Admitting: *Deleted

## 2020-11-09 DIAGNOSIS — Z01810 Encounter for preprocedural cardiovascular examination: Secondary | ICD-10-CM

## 2020-11-09 DIAGNOSIS — K436 Other and unspecified ventral hernia with obstruction, without gangrene: Secondary | ICD-10-CM | POA: Diagnosis not present

## 2020-11-09 DIAGNOSIS — R197 Diarrhea, unspecified: Secondary | ICD-10-CM | POA: Diagnosis not present

## 2020-11-09 DIAGNOSIS — N179 Acute kidney failure, unspecified: Secondary | ICD-10-CM | POA: Diagnosis not present

## 2020-11-09 DIAGNOSIS — I739 Peripheral vascular disease, unspecified: Secondary | ICD-10-CM

## 2020-11-09 DIAGNOSIS — K56609 Unspecified intestinal obstruction, unspecified as to partial versus complete obstruction: Secondary | ICD-10-CM | POA: Diagnosis not present

## 2020-11-09 LAB — CULTURE, BLOOD (ROUTINE X 2)
Culture: NO GROWTH
Culture: NO GROWTH
Special Requests: ADEQUATE

## 2020-11-09 LAB — GLUCOSE, CAPILLARY
Glucose-Capillary: 136 mg/dL — ABNORMAL HIGH (ref 70–99)
Glucose-Capillary: 137 mg/dL — ABNORMAL HIGH (ref 70–99)
Glucose-Capillary: 144 mg/dL — ABNORMAL HIGH (ref 70–99)
Glucose-Capillary: 180 mg/dL — ABNORMAL HIGH (ref 70–99)

## 2020-11-09 MED ORDER — SACCHAROMYCES BOULARDII 250 MG PO CAPS: 250.0000 mg | ORAL_CAPSULE | Freq: Two times a day (BID) | ORAL | 0 refills | Status: DC

## 2020-11-09 NOTE — Plan of Care (Signed)
  Problem: Clinical Measurements: Goal: Will remain free from infection Outcome: Progressing Goal: Diagnostic test results will improve Outcome: Progressing Goal: Respiratory complications will improve Outcome: Progressing Goal: Cardiovascular complication will be avoided Outcome: Progressing   Problem: Activity: Goal: Risk for activity intolerance will decrease Outcome: Progressing   

## 2020-11-09 NOTE — Discharge Summary (Signed)
Physician Discharge Summary  Erin Mitchell SWN:[id number] DOB: 1945-11-01  PCP: Luetta Nutting, DO  Admitted from: Home Discharged to: Home  Admit date: 11/03/2020 Discharge date: 11/09/2020  Recommendations for Outpatient Follow-up:    Follow-up Information    Armandina Gemma, MD. Go on 12/17/2020.   Specialty: General Surgery Why: 4:30 PM to discuss elective hernia repair. Please arrive 30 min prior to appointment time.  Contact information: [address] [place] [address] [phone]        Luetta Nutting, DO. Schedule an appointment as soon as possible for a visit in 1 week(s).   Specialty: Family Medicine Why: To be seen with repeat labs (CBC & BMP).  Lisinopril on hold due to recent acute kidney injury.  Titrate BP meds at next visit as deemed necessary. Contact information: [place] [address] [phone]        Lelon Perla, MD Follow up.   Specialty: Cardiology Why: MDs office will arrange outpatient follow-up for nuclear stress test and MD follow-up.  Please call them if you do not hear from them in 3-4 business days. Contact information: [place] [place] [address] [phone]                Home Health: None    Equipment/Devices: None    Discharge Condition: Improved and stable   Code Status: DNR Diet recommendation:  Discharge Diet Orders (From admission, onward)    Start     Ordered   11/09/20 0000  Diet - low sodium heart healthy        11/09/20 1052   11/09/20 0000  Diet Carb Modified        11/09/20 1052           Discharge Diagnoses:  Principal Problem:   SBO (small bowel obstruction) (HCC) Active Problems:   PAF (paroxysmal atrial fibrillation) (HCC)   Acquired hypothyroidism   Incarcerated ventral hernia   Nausea   Abdominal pain   AKI (acute kidney injury) ([place])   Leukocytosis   Acute cystitis   Hypertension   Diabetes ([place])   Brief  Summary: 75 y.o.femalewith medical history significant forchronic/recurrent ventral abdominal hernia, type 2 diabetes mellitus, acquired hypothyroidism, essential hypertension, paroxysmal atrial fibrillation not on chronic formal anticoagulation,who is admitted to [facility] on 5/31/2022with small bowel obstruction in the setting of incarcerated ventral abdominal herniaafter presenting from home to [facility] complaining ofabdominal pain.Ventral hernia reduced in the ED.  Briefly improved then worsened diarrhea and some abdominal pain.  Repeated CT abdomen 6/3, noted severe enteritis (could be bowel wall edema) but no further evidence of bowel obstruction.  GI recommends supportive care and signed off.  Assessment & Plan:   Incarcerated ventral incisional hernia with small bowel obstruction: General surgery consulted.  Treated supportively with NPO, NG tube to low wall suction, IV fluids. Ventral hernia mechanically reduced in the ED.  Briefly improved then worsened diarrhea, some abdominal pain and recurrent partially reducible ventral hernia. Repeated CT abdomen 6/3, noted severe enteritis (could be bowel wall edema) but no further evidence of bowel obstruction.    Diet was gradually advanced which she tolerated.  Diarrhea has resolved.  Hernia remains reducible.  General surgery have seen her today and cleared her for discharge and have arranged outpatient follow-up to plan for elective outpatient laparoscopic hernia repair after cardiac clearance including nuclear testing.  Plavix resumed. 2D echo shows LVEF 65-70%.  I  have reached out to cardiology team who will arrange follow-up with them.  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  Current diarrhea preceded her SBO by about 9 to 10 days.  No recent antibiotic  exposure.  C. difficile and GI panel PCR negative.  CT abdomen 6/3 suggests "severe enteritis" which could be due to bowel wall edema.  GI recommends continued supportive care and if diarrhea persists for significant amount of time post discharge, then outpatient follow-up in their office for additional work-up.  Continue probiotics.  Diet was gradually advanced which she has tolerated.  No BM for approximately 48 hours.  Passing flatus.  Acute kidney injury: Likely multifactorial due to prerenal from GI losses, poor oral intake along with lisinopril use.  Intermittent soft blood pressures and ATN also possible.  Presented with creatinine of 0.88 which rapidly increased to 2.01.  Resolved after IV fluids.  Continue to hold lisinopril until close outpatient follow-up with PCP with repeat labs to determine if reasonable to resume lisinopril if blood pressures warrant it.  Anion gap metabolic acidosis: Due to GI losses.  Resolved.  Hypokalemia: Replaced.  Dilutional anemia: No blood loss.  Stable.  Asymptomatic bacteriuria: Patient denies UTI symptoms, has had UTI in the past.  Discontinued empirically started ceftriaxone.  Type II DM with proteinuria/nephropathy:  Treated with SSI in the hospital.  Reasonable inpatient control.  Hold lisinopril due to recent AKI.  Resume metformin at discharge.  This is not a new medication for her.  Good control with A1c 6.8 on 10/08/2020  Acquired hypothyroidism: Continue thyroid [place]  Essential hypertension: Lisinopril and metoprolol were held in the hospital due to soft blood pressures complicated by AKI.  Blood pressure starting to rise.  Resume metoprolol at discharge.  Continue to hold lisinopril until outpatient follow-up with PCP.  Paroxysmal A. fib: CHA2DS2-VASc score of 3.  As per detailed discussion with patient, she had a single episode of A. fib post vascular procedure in [place] last year, extensively evaluated by cardiology including Holter  monitoring which did not show any further A. fib and even had a TEE due to concerns for endocarditis which was ruled out.  Patient likely not on anticoagulation due to single postprocedure A. fib episode.  Continue metoprolol and Plavix.  Cardiology input appreciated.  2D echo with normal EF.    Remained in sinus rhythm.  Incidental CT abdomen findings 11/03/2020 & 11/06/2020, outpatient follow-up Moderate diffuse colonic diverticulosis. Fluid and gas mildly distends the uterine cavity, nonspecific.  Pelvic ultrasound as outpatient Simple 3.1 cm right adnexal cyst, recommend follow-up ultrasound in 6 to 12 months. 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  Recommend annual cross-sectional surveillance.  Body mass index is 27.55 kg/m.       Consultants:   General surgery Cardiology [facility].  Procedures:   NG tube- discontinued 6/1   Discharge Instructions  Discharge Instructions    Call MD for:  difficulty breathing, headache or visual disturbances   Complete by: As directed    Call MD for:  extreme fatigue   Complete by: As directed    Call MD for:  persistant dizziness or light-headedness   Complete by: As directed    Call MD for:  persistant nausea and vomiting   Complete by: As directed    Call MD for:  severe uncontrolled pain   Complete by: As directed    Call MD for:  temperature >100.4   Complete by: As directed    Diet - low sodium heart healthy   Complete by: As directed    Diet Carb Modified   Complete by: As directed    Increase activity slowly   Complete by: As directed        Medication List    STOP taking these medications   lisinopril 20 MG tablet Commonly known as: ZESTRIL     TAKE these medications   B-D SINGLE USE SWABS REGULAR Pads Use as directed.   clopidogrel 75 MG tablet Commonly known as: PLAVIX TAKE 1 TABLET BY MOUTH ONCE DAILY What changed:   how much to take  how to take this  when to take  this  additional instructions   loperamide 2 MG tablet Commonly known as: IMODIUM A-D Take 2 mg by mouth 4 (four) times daily as needed for diarrhea or loose stools. What changed: Another medication with the same name was removed. Continue taking this medication, and follow the directions you see here.   metFORMIN 500 MG tablet Commonly known as: GLUCOPHAGE Take 1 tablet (500 mg total) by mouth 2 (two) times daily with a meal.   metoprolol tartrate 25 MG tablet Commonly known as: LOPRESSOR TAKE 1 TABLET BY MOUTH TWICE DAILY WITH FOOD What changed:   how much to take  how to take this  when to take this  additional instructions   NP Thyroid 90 MG tablet Generic drug: thyroid Take 90 mg by mouth daily before breakfast. What changed: Another medication with the same name was removed. Continue taking this medication, and follow the directions you see here.   rosuvastatin 20 MG tablet Commonly known as: CRESTOR TAKE 1 TABLET BY MOUTH ONCE DAILY What changed:   how much to take  how to take this  when to take this  additional instructions   saccharomyces boulardii 250 MG capsule Commonly known as: FLORASTOR Take 1 capsule (250 mg total) by mouth 2 (two) times daily.   True Metrix Blood Glucose Test test strip Generic drug: glucose blood Use as instructed   True Metrix Meter w/Device Kit Use as directed to test blood glucose daily   TRUEplus Lancets 33G Misc Use as directed daily.      No Known Allergies    Procedures/Studies: CT ABDOMEN PELVIS WO CONTRAST  Result Date: 11/06/2020 CLINICAL DATA:  Abdominal pain.  Hernia suspected. EXAM: CT ABDOMEN AND PELVIS WITHOUT CONTRAST TECHNIQUE: Multidetector CT imaging of the abdomen and pelvis was performed following the standard protocol without IV contrast. COMPARISON:  11/03/2020 FINDINGS: Lower chest: Lung bases are clear. Hepatobiliary: New small amount of perihepatic ascites. Gallbladder has been removed.  Pancreas: Unremarkable. No pancreatic ductal dilatation or surrounding inflammatory changes. Spleen: New perisplenic ascites. 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 No splenic enlargement. Adrenals/Urinary Tract: Normal appearance of the adrenal glands. No kidney stones or hydronephrosis. Small amount of fluid in the urinary bladder. Stomach/Bowel: Colon is decompressed. Evidence for diverticular changes in the sigmoid colon and limited evaluation of the sigmoid colon on sequence 3, image 62. There is oral contrast in the right colon. Normal appearance of the stomach without distension. Possible duodenal diverticulum near the second portion of the duodenum. Dilated loops of small bowel in the abdomen measuring up to 2.7 cm.  Extensive wall thickening involving loops of distal small bowel in the ileum. Small bowel wall thickening measures up to 1.0 cm. Oral contrast within the distal small bowel. Mesenteric edema. Vascular/Lymphatic: Atherosclerotic calcifications in the aorta and iliac arteries without aneurysm. No lymph node enlargement in the abdomen or pelvis. Reproductive: Small focus of gas at the uterine fundus is similar to the recent comparison examination. Small amount of fluid around the uterine fundus. Again noted is a low-density structure in the right adnexa that measures roughly 3.3 cm. Other: Again noted is a periumbilical ventral hernia. There is no longer bowel within this ventral hernia. The ventral hernia now contains fat with mild stranding. Small amount of free fluid in the lower abdomen and pelvis. Musculoskeletal: No acute bone abnormality. IMPRESSION: 1. Severe bowel wall thickening involving the distal small bowel including the terminal ileum. This represents a significant change from the exam on 11/03/2020. Findings are suggestive for enteritis. New ascites and mesenteric  stranding associated with the bowel inflammation. 2. Ventral hernia no longer contains small bowel. 3. Mild distention of small bowel but there is oral contrast in the right colon. 4. Persistent unusual findings involving the uterus and right adnexa in a patient of this age. Again noted is a small focus of gas within the uterus and low-density right adnexal/ovarian structure. These areas may be better characterized with ultrasound. 5. 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 Recommend annual cross-sectional surveillance. These results will be called to the ordering clinician or representative by the Radiologist Assistant, and communication documented in the PACS or [employer]. Electronically Signed   By: Markus Daft M.D.   On: 11/06/2020 17:49   CT ABDOMEN PELVIS WO CONTRAST  Result Date: 11/03/2020 CLINICAL DATA:  Left upper quadrant abdominal pain and diarrhea for 9 days. Episode of bloody stools. Weight loss of 8-9 pounds. Prior appendectomy and cholecystectomy. History of diverticulitis. EXAM: CT ABDOMEN AND PELVIS WITHOUT CONTRAST TECHNIQUE: Multidetector CT imaging of the abdomen and pelvis was performed following the standard protocol without IV contrast. COMPARISON:  None. FINDINGS: Lower chest: No significant pulmonary nodules or acute consolidative airspace disease. Hepatobiliary: Normal liver size. No liver mass. Cholecystectomy. No biliary ductal dilatation. Pancreas: Normal, with no mass or duct dilation. Spleen: Normal size. No mass. Adrenals/Urinary Tract: Normal adrenals. No renal stones. No hydronephrosis. No contour deforming renal masses. Normal bladder. Stomach/Bowel: Normal non-distended stomach. Moderate periumbilical ventral abdominal hernia containing an incarcerated distal small bowel loop, proximal to which the small bowel is diffusely mildly dilated and fluid-filled (up to 3.1 cm diameter), and distal to which the small bowel is relatively  collapsed, compatible with mechanical distal small bowel obstruction. No thick walled small bowel loops. No pneumatosis. Appendectomy. Moderate diffuse colonic diverticulosis with no large bowel wall thickening or significant pericolonic fat stranding. Vascular/Lymphatic: Atherosclerotic nonaneurysmal abdominal aorta. 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 No pathologically enlarged lymph nodes in the abdomen or pelvis. Reproductive: Fluid and gas mildly distends the uterine cavity. Simple 3.1 cm right adnexal cyst (series 2/image 64). No left adnexal mass. Other: No pneumoperitoneum, ascites or focal fluid collection. Musculoskeletal: No aggressive appearing focal osseous lesions. Mild thoracolumbar spondylosis. IMPRESSION: 1. Mechanical distal small bowel obstruction due to incarcerated distal small bowel loop within a moderate size periumbilical ventral abdominal hernia. No free air. No abscess. No pneumatosis. Surgical consultation advised. 2. Moderate diffuse colonic diverticulosis  with no evidence of acute diverticulitis. 3. Fluid and gas mildly distends the uterine cavity, nonspecific. Recommend correlation with pelvic ultrasound on a short term outpatient basis. 4. Simple 3.1 cm right adnexal cyst. Recommend follow-up US in 6-12 months. Note: This recommendation does not apply to premenarchal patients and to those with increased risk (genetic, family history, elevated tumor markers or other high-risk factors) of ovarian cancer. Reference: JACR 2020 Feb; 17(2):248-254 5. Aortic Atherosclerosis (ICD10-I70.0). Electronically Signed   By: Ilona Sorrel M.D.   On: 11/03/2020 13:46   DG Abd Portable 1V  Result Date: 11/04/2020 CLINICAL DATA:  Abdominal pain EXAM: PORTABLE ABDOMEN - 1 VIEW COMPARISON:  11/03/2020 FINDINGS: Enteric catheter remains positioned within the distal stomach. Enteric contrast has  progressed and is now present throughout the colon. Single prominent loop of bowel within the right hemiabdomen, which may represent an air-filled cecum. Otherwise, no evidence of bowel obstruction. IMPRESSION: 1. Enteric tube remains positioned within the distal stomach. 2. Single prominent loop of bowel within the right hemiabdomen, which may represent an air-filled cecum. Otherwise, no evidence of bowel obstruction. Oral contrast has progressed throughout the colon. Electronically Signed   By: Davina Poke D.O.   On: 11/04/2020 09:21   DG Abd Portable 1 View  Result Date: 11/03/2020 CLINICAL DATA:  Enteric catheter placement EXAM: PORTABLE ABDOMEN - 1 VIEW COMPARISON:  11/03/2020 FINDINGS: Frontal view of the lower chest and upper abdomen demonstrates advancement of the enteric catheter, tip and side port coiled over the gastric antrum. Fluid-filled loops of bowel are seen throughout the abdomen and pelvis. Lung bases are clear. IMPRESSION: 1. Enteric catheter tip coiled over the gastric antrum. Electronically Signed   By: Randa Ngo M.D.   On: 11/03/2020 23:31   DG Abd Portable 1 View  Result Date: 11/03/2020 CLINICAL DATA:  NG tube placement EXAM: PORTABLE ABDOMEN - 1 VIEW COMPARISON:  None. FINDINGS: NG tube tip is in the proximal stomach with the side port at the GE junction. IMPRESSION: NG tube tip in the stomach. Electronically Signed   By: Rolm Baptise M.D.   On: 11/03/2020 22:51   ECHOCARDIOGRAM COMPLETE  Result Date: 11/05/2020    ECHOCARDIOGRAM REPORT   Patient Name:   Erin Mitchell Date of Exam: 11/05/2020 Medical Rec #:  [id number]        Height:       64.0 in Accession #:    [id number]       Weight:       153.7 lb Date of Birth:  02-29-1946        BSA:          1.749 m Patient Age:    44 years         BP:           104/54 mmHg Patient Gender: F                HR:           80 bpm. Exam Location:  Inpatient Procedure: 2D Echo, Cardiac Doppler and Color Doppler Indications:     I48.91* Unspeicified atrial fibrillation  History:        Patient has no prior history of Echocardiogram examinations.                 Arrythmias:Atrial Fibrillation; Risk Factors:Hypertension and                 Diabetes.  Sonographer:    Bernadene Person RDCS Referring Phys:  [place] Left ventricular ejection fraction, by estimation, is 65 to 70%. The left ventricle has normal function. The left ventricle has no regional wall motion abnormalities. There is mild left ventricular hypertrophy of the basal-septal segment. Left ventricular diastolic parameters are consistent with Grade I diastolic dysfunction (impaired relaxation).  2. Right ventricular systolic function is normal. The right ventricular size is normal. There is normal pulmonary artery systolic pressure.  3. The mitral valve is normal in structure. No evidence of mitral valve regurgitation. No evidence of mitral stenosis.  4. The aortic valve is normal in structure. Aortic valve regurgitation is not visualized. No aortic stenosis is present.  5. The inferior vena cava is normal in size with greater than 50% respiratory variability, suggesting right atrial pressure of 3 mmHg. FINDINGS  Left Ventricle: Left ventricular ejection fraction, by estimation, is 65 to 70%. The left ventricle has normal function. The left ventricle has no regional wall motion abnormalities. The left ventricular internal cavity size was normal in size. There is  mild left ventricular hypertrophy of the basal-septal segment. Left ventricular diastolic parameters are consistent with Grade I diastolic dysfunction (impaired relaxation). Normal left ventricular filling pressure. Right Ventricle: The right ventricular size is normal. No increase in right ventricular wall thickness. Right ventricular systolic function is normal. There is normal pulmonary artery systolic pressure. The tricuspid regurgitant velocity is 2.39 m/s, and  with an assumed right atrial  pressure of 3 mmHg, the estimated right ventricular systolic pressure is 44.3 mmHg. Left Atrium: Left atrial size was normal in size. Right Atrium: Right atrial size was normal in size. Pericardium: There is no evidence of pericardial effusion. Mitral Valve: The mitral valve is normal in structure. Mild mitral annular calcification. No evidence of mitral valve regurgitation. No evidence of mitral valve stenosis. Tricuspid Valve: The tricuspid valve is normal in structure. Tricuspid valve regurgitation is trivial. No evidence of tricuspid stenosis. Aortic Valve: The aortic valve is normal in structure. Aortic valve regurgitation is not visualized. No aortic stenosis is present. Pulmonic Valve: The pulmonic valve was normal in structure. Pulmonic valve regurgitation is not visualized. No evidence of pulmonic stenosis. Aorta: The aortic root is normal in size and structure. Venous: The inferior vena cava is normal in size with greater than 50% respiratory variability, suggesting right atrial pressure of 3 mmHg. IAS/Shunts: There is redundancy of the interatrial septum. No atrial level shunt detected by color flow Doppler.  LEFT VENTRICLE PLAX 2D LVIDd:         4.20 cm  Diastology LVIDs:         2.60 cm  LV e' medial:    4.50 cm/s LV PW:         1.00 cm  LV E/e' medial:  15.1 LV IVS:        1.20 cm  LV e' lateral:   7.43 cm/s LVOT diam:     2.00 cm  LV E/e' lateral: 9.1 LV SV:         86 LV SV Index:   49 LVOT Area:     3.14 cm  RIGHT VENTRICLE RV S prime:     12.40 cm/s TAPSE (M-mode): 2.6 cm LEFT ATRIUM             Index       RIGHT ATRIUM           Index LA diam:        3.10 cm 1.77 cm/m  RA  Area:     11.50 cm LA Vol (A2C):   45.3 ml 25.90 ml/m RA Volume:   23.60 ml  13.49 ml/m LA Vol (A4C):   45.3 ml 25.90 ml/m LA Biplane Vol: 46.2 ml 26.41 ml/m  AORTIC VALVE LVOT Vmax:   137.00 cm/s LVOT Vmean:  97.500 cm/s LVOT VTI:    0.273 m  AORTA Ao Root diam: 3.20 cm Ao Asc diam:  3.10 cm MITRAL VALVE                 TRICUSPID VALVE MV Area (PHT): 3.63 cm     TR Peak grad:   22.8 mmHg MV Decel Time: 209 msec     TR Vmax:        239.00 cm/s MV E velocity: 67.90 cm/s MV A velocity: 106.00 cm/s  SHUNTS MV E/A ratio:  0.64         Systemic VTI:  0.27 m                             Systemic Diam: 2.00 cm Fransico Him MD Electronically signed by Fransico Him MD Signature Date/Time: 11/05/2020/2:09:10 PM    Final       Subjective: Feels much better.  Denies complaints.  No BM since 6/4.  Tolerating regular consistency diet.  Passing flatus.  No abdominal pain.  Ambulating well in the room.  Discharge Exam:  Vitals:   11/08/20 1040 11/08/20 1529 11/08/20 2021 11/09/20 0434  BP: (!) 150/80 139/78 135/75 (!) 166/84  Pulse: 81 94 93 71  Resp: _0 Temp: 97.7 F (36.5 C) 98.5 F (36.9 C) 98.2 F (36.8 C) 98.3 F (36.8 C)  TempSrc: Oral Oral Oral Oral  SpO2: 99% 97% 98% 96%  Weight:        General exam: Elderly female, moderately built and nourished, seen ambulating comfortably in the room. Respiratory system: Clear to auscultation.  No increased work of breathing. Cardiovascular system: S1 and S2 heard, RRR.  No JVD, murmurs.  Trace bilateral ankle edema.   Gastrointestinal system: Abdomen is soft, nondistended and nontender.  Normal bowel sounds heard.  Did not appreciate ventral hernia today. Central nervous system: Alert and oriented. No focal neurological deficits. Extremities: Symmetric 5 x 5 power. Skin: No rashes, lesions or ulcers Psychiatry: Judgement and insight appear normal. Mood & affect appropriate.    The results of significant diagnostics from this hospitalization (including imaging, microbiology, ancillary and laboratory) are listed below for reference.     Microbiology: Recent Results (from the past 240 hour(s))  Resp Panel by RT-PCR (Flu A&B, Covid) Nasopharyngeal Swab     Status: None   Collection Time: 11/03/20  8:48 PM   Specimen: Nasopharyngeal Swab; Nasopharyngeal(NP)  swabs in vial transport medium  Result Value Ref Range Status   SARS Coronavirus 2 by RT PCR NEGATIVE NEGATIVE Final    Comment: (NOTE) SARS-CoV-2 target nucleic acids are NOT DETECTED.  The SARS-CoV-2 RNA is generally detectable in upper respiratory specimens during the acute phase of infection. The lowest concentration of SARS-CoV-2 viral copies this assay can detect is 138 copies/mL. A negative result does not preclude SARS-Cov-2 infection and should not be used as the sole basis for treatment or other patient management decisions. A negative result may occur with  improper specimen collection/handling, submission of specimen other than nasopharyngeal swab, presence of viral mutation(s) within the areas targeted by this assay, and inadequate number  of viral copies(<138 copies/mL). A negative result must be combined with clinical observations, patient history, and epidemiological information. The expected result is Negative.  Fact Sheet for Patients:  EntrepreneurPulse.com.au  Fact Sheet for Healthcare Providers:  IncredibleEmployment.be  This test is no t yet approved or cleared by the [place] FDA and  has been authorized for detection and/or diagnosis of SARS-CoV-2 by FDA under an Emergency Use Authorization (EUA). This EUA will remain  in effect (meaning this test can be used) for the duration of the COVID-19 declaration under Section 564(b)(1) of the Act, 21 U.S.C.section 360bbb-3(b)(1), unless the authorization is terminated  or revoked sooner.       Influenza A by PCR NEGATIVE NEGATIVE Final   Influenza B by PCR NEGATIVE NEGATIVE Final    Comment: (NOTE) The Xpert Xpress SARS-CoV-2/FLU/RSV plus assay is intended as an aid in the diagnosis of influenza from Nasopharyngeal swab specimens and should not be used as a sole basis for treatment. Nasal washings and aspirates are unacceptable for Xpert Xpress  SARS-CoV-2/FLU/RSV testing.  Fact Sheet for Patients: EntrepreneurPulse.com.au  Fact Sheet for Healthcare Providers: IncredibleEmployment.be  This test is not yet approved or cleared by the [place] FDA and has been authorized for detection and/or diagnosis of SARS-CoV-2 by FDA under an Emergency Use Authorization (EUA). This EUA will remain in effect (meaning this test can be used) for the duration of the COVID-19 declaration under Section 564(b)(1) of the Act, 21 U.S.C. section 360bbb-3(b)(1), unless the authorization is terminated or revoked.  Performed at [facility], [place] [address], [place], [place] [address]   Culture, blood (Routine X 2) w Reflex to ID Panel     Status: None   Collection Time: 11/04/20  4:40 AM   Specimen: BLOOD LEFT FOREARM  Result Value Ref Range Status   Specimen Description BLOOD LEFT FOREARM  Final   Special Requests   Final    BOTTLES DRAWN AEROBIC AND ANAEROBIC Blood Culture adequate volume   Culture   Final    NO GROWTH 5 DAYS Performed at [facility], [place] [address] [place], [place] [address]    Report Status 11/09/2020 FINAL  Final  Culture, blood (Routine X 2) w Reflex to ID Panel     Status: None   Collection Time: 11/04/20  4:45 AM   Specimen: BLOOD RIGHT WRIST  Result Value Ref Range Status   Specimen Description BLOOD RIGHT WRIST  Final   Special Requests   Final    BOTTLES DRAWN AEROBIC AND ANAEROBIC Blood Culture results may not be optimal due to an excessive volume of blood received in culture bottles   Culture   Final    NO GROWTH 5 DAYS Performed at [facility], [place] [address] [place], [place] [address]    Report Status 11/09/2020 FINAL  Final  C Difficile Quick Screen w PCR reflex     Status: None   Collection Time: 11/04/20  7:33 AM   Specimen: STOOL  Result Value Ref Range Status   C Diff antigen NEGATIVE NEGATIVE Final   C Diff toxin NEGATIVE NEGATIVE Final    C Diff interpretation No C. difficile detected.  Final    Comment: Performed at [facility], [place] [address] [place], [place] [address]  Gastrointestinal Panel by PCR , Stool     Status: None   Collection Time: 11/04/20  7:33 AM   Specimen: Stool  Result Value Ref Range Status  Campylobacter species NOT DETECTED NOT DETECTED Final   Plesimonas shigelloides NOT DETECTED NOT DETECTED Final   Salmonella species NOT DETECTED NOT DETECTED Final   Yersinia enterocolitica NOT DETECTED NOT DETECTED Final   Vibrio species NOT DETECTED NOT DETECTED Final   Vibrio cholerae NOT DETECTED NOT DETECTED Final   Enteroaggregative E coli (EAEC) NOT DETECTED NOT DETECTED Final   Enteropathogenic E coli (EPEC) NOT DETECTED NOT DETECTED Final   Enterotoxigenic E coli (ETEC) NOT DETECTED NOT DETECTED Final   Shiga like toxin producing E coli (STEC) NOT DETECTED NOT DETECTED Final   Shigella/Enteroinvasive E coli (EIEC) NOT DETECTED NOT DETECTED Final   Cryptosporidium NOT DETECTED NOT DETECTED Final   Cyclospora cayetanensis NOT DETECTED NOT DETECTED Final   Entamoeba histolytica NOT DETECTED NOT DETECTED Final   Giardia lamblia NOT DETECTED NOT DETECTED Final   Adenovirus F40/41 NOT DETECTED NOT DETECTED Final   Astrovirus NOT DETECTED NOT DETECTED Final   Norovirus GI/GII NOT DETECTED NOT DETECTED Final   Rotavirus A NOT DETECTED NOT DETECTED Final   Sapovirus (I, II, IV, and V) NOT DETECTED NOT DETECTED Final    Comment: Performed at [facility], [place] [place], [place], [place] [address]     Labs: CBC: Recent Labs  Lab 11/03/20 1139 11/03/20 1820 11/04/20 0454 11/05/20 0044 11/06/20 0103 11/07/20 0120  WBC 9.7 13.7* 16.0* 14.7* 16.2* 13.1*  NEUTROABS 6,809 9.6* 12.9*  --   --   --   HGB 12.4 13.4 11.8* 11.4* 10.8* 11.1*  HCT 39.7 40.4 35.9* 34.2* 33.4* 32.7*  MCV 88.2 87.4 88.2 86.6 88.6 87.0  PLT 314 321 300 269 267 604    Basic Metabolic Panel: Recent Labs  Lab  11/04/20 0454 11/05/20 0044 11/06/20 0103 11/07/20 0120 11/08/20 0123  NA 136 135 135 135 134*  K 4.2 3.6 3.3* 4.0 3.8  CL 102 102 108 108 108  CO2 17* 23 21* 18* 19*  GLUCOSE 140* 113* 155* 92 99  BUN 39* 36* 26* 20 13  CREATININE 2.01* 1.35* 0.90 0.78 0.66  CALCIUM 8.9 8.3* 8.0* 7.9* 7.9*  MG 1.7  --   --   --   --     Liver Function Tests: Recent Labs  Lab 11/03/20 1139 11/03/20 1820 11/04/20 0454  AST _0 ALT _1 ALKPHOS  --  75 71  BILITOT 0.5 0.8 1.1  PROT 6.5 7.3 6.3*  ALBUMIN  --  4.0 3.5    CBG: Recent Labs  Lab 11/08/20 1306 11/08/20 1816 11/09/20 0010 11/09/20 0558 11/09/20 0729  GLUCAP 191* 249* 136* 137* 144*    Urinalysis    Component Value Date/Time   COLORURINE AMBER (A) 11/04/2020 0149   APPEARANCEUR CLOUDY (A) 11/04/2020 0149   LABSPEC 1.019 11/04/2020 0149   PHURINE 5.0 11/04/2020 0149   GLUCOSEU NEGATIVE 11/04/2020 0149   HGBUR NEGATIVE 11/04/2020 0149   BILIRUBINUR NEGATIVE 11/04/2020 0149   KETONESUR NEGATIVE 11/04/2020 0149   PROTEINUR >=300 (A) 11/04/2020 0149   NITRITE NEGATIVE 11/04/2020 0149   LEUKOCYTESUR LARGE (A) 11/04/2020 0149      Time coordinating discharge: 35 minutes  SIGNED:  Vernell Leep, MD, FACP, [facility]. [facility]  To contact the attending provider between 7A-7P or the covering provider during after hours 7P-7A, please log into the web site [email or web address] and access using universal [place] password for that web site. If you do not have the password, please call the hospital operator.

## 2020-11-09 NOTE — Care Management Important Message (Signed)
Important Message  Patient Details  Name: Erin Mitchell MRN: [id number] Date of Birth: [birth year]   Medicare Important Message Given:  Yes  Patient left prior to IM delivery  Will mail copy to the patient home address.    Juddson Cobern 11/09/2020, 12:35 PM

## 2020-11-09 NOTE — Discharge Instructions (Addendum)
You have a Stress Test scheduled at [facility]. Your doctor has ordered this test to check the blood flow in your heart arteries.  Please arrive 15 minutes early for paperwork. The whole test will take several hours. You may want to bring reading material to remain occupied while undergoing different parts of the test.  Instructions:  No food/drink after midnight the night before.  It is OK to take your morning meds with a sip of water EXCEPT for those types of medicines listed below or otherwise instructed.  No caffeine/decaf products 24 hours before, including medicines such as Excedrin or Goody Powders. Call if there are any questions.   Wear comfortable clothes and shoes.   Special Medication Instructions:  Do not take your morning metformin on the day of your test.   What To Expect: When you arrive in the lab, the technician will inject a small amount of radioactive tracer into your arm through an IV while you are resting quietly. This helps [place] to form pictures of your heart. You will likely only feel a sting from the IV. After a waiting period, resting pictures will be obtained under a big camera. These are the "before" pictures.  Next, you will be prepped for the stress portion of the test. This may include either walking on a treadmill or receiving a medicine that helps to dilate blood vessels in your heart to simulate the effect of exercise on your heart. If you are walking on a treadmill, you will walk at different paces to try to get your heart rate to a goal number that is based on your age. If your doctor has chosen the pharmacologic test, then you will receive a medicine through your IV that may cause temporary nausea, flushing, shortness of breath and sometimes chest discomfort or vomiting. This is typically short-lived and usually resolves quickly. If you experience symptoms, that does not automatically mean the test is abnormal. Some patients do not  experience any symptoms at all. Your blood pressure and heart rate will be monitored, and we will be watching your EKG on a computer screen for any changes. During this portion of the test, the radiologist will inject another small amount of radioactive tracer into your IV. After a waiting period, you will undergo a second set of pictures. These are the "after" pictures.  The doctor reading the test will compare the before-and-after images to look for evidence of heart blockages or heart weakness. The test usually takes 1 day to complete, but in certain instances (for example, if a patient is over a certain weight limit), the test may be done over the span of 2 days.    Ventral Hernia  A ventral hernia is a bulge of tissue from inside the abdomen that pushes through a weak area of the muscles that form the front wall of the abdomen. The tissues inside the abdomen are inside a sac (peritoneum). These tissues include the small intestine, large intestine, and the fatty tissue that covers the intestines (omentum). Sometimes, the bulge that forms a hernia contains intestines. Other hernias contain only fat. Ventral hernias do not go away without surgical treatment. There are several types of ventral hernias. You may have:  A hernia at an incision site from previous abdominal surgery (incisional hernia).  A hernia just above the belly button (epigastric hernia), or at the belly button (umbilical hernia). These types of hernias can develop from heavy lifting or straining.  A hernia that  comes and goes (reducible hernia). It may be visible only when you lift or strain. This type of hernia can be pushed back into the abdomen (reduced).  A hernia that traps abdominal tissue inside the hernia (incarcerated hernia). This type of hernia does not reduce.  A hernia that cuts off blood flow to the tissues inside the hernia (strangulated hernia). The tissues can start to die if this happens. This is a very painful  bulge that cannot be reduced. A strangulated hernia is a medical emergency. What are the causes? This condition is caused by abdominal tissue putting pressure on an area of weakness in the abdominal muscles. What increases the risk? The following factors may make you more likely to develop this condition:  Being age 73 or older.  Being overweight or obese.  Having had previous abdominal surgery, especially if there was an infection after surgery.  Having had an injury to the abdominal wall.  Frequently lifting or pushing heavy objects.  Having had several pregnancies.  Having a buildup of fluid inside the abdomen (ascites).  Straining to have a bowel movement or to urinate.  Having frequent coughing episodes. What are the signs or symptoms? The only symptom of a ventral hernia may be a painless bulge in the abdomen. A reducible hernia may be visible only when you strain, cough, or lift. Other symptoms may include:  Dull pain.  A feeling of pressure. Signs and symptoms of a strangulated hernia may include:  Increasing pain.  Nausea and vomiting.  Pain when pressing on the hernia.  The skin over the hernia turning red or purple.  Constipation.  Blood in the stool (feces). How is this diagnosed? This condition may be diagnosed based on:  Your symptoms.  Your medical history.  A physical exam. You may be asked to cough or strain while standing. These actions increase the pressure inside your abdomen and force the hernia through the opening in your muscles. Your health care provider may try to reduce the hernia by gently pushing the hernia back in.  Imaging studies, such as an ultrasound or CT scan. How is this treated? This condition is treated with surgery. If you have a strangulated hernia, surgery is done as soon as possible. If your hernia is small and not incarcerated, you may be asked to lose some weight before surgery. Follow these instructions at  home:  Follow instructions from your health care provider about eating or drinking restrictions.  If you are overweight, your health care provider may recommend that you increase your activity level and eat a healthier diet.  Do not lift anything that is heavier than 10 lb (4.5 kg), or the limit that you are told, until your health care provider says that it is safe.  Return to your normal activities as told by your health care provider. Ask your health care provider what activities are safe for you. You may need to avoid activities that increase pressure on your hernia.  Take over-the-counter and prescription medicines only as told by your health care provider.  Keep all follow-up visits. This is important. Contact a health care provider if:  Your hernia gets larger.  Your hernia becomes painful. Get help right away if:  Your hernia becomes increasingly painful.  You have pain along with any of the following: ? Changes in skin color in the area of the hernia. ? Nausea. ? Vomiting. ? Fever. These symptoms may represent a serious problem that is an emergency. Do not wait  to see if the symptoms will go away. Get medical help right away. Call your local emergency services (911 in the [place] Do not drive yourself to the hospital. Summary  A ventral hernia is a bulge of tissue from inside the abdomen that pushes through a weak area of the muscles that form the front wall of the abdomen.  This condition is treated with surgery, which may be urgent depending on your hernia.  Do not lift anything that is heavier than 10 lb (4.5 kg), and follow activity instructions from your health care provider. This information is not intended to replace advice given to you by your health care provider. Make sure you discuss any questions you have with your health care provider. Document Revised: 01/10/2020 Document Reviewed: 01/10/2020 Elsevier Patient Education  2021 Elsevier Inc.  Additional  discharge instructions  Please get your medications reviewed and adjusted by your Primary MD.  Please request your Primary MD to go over all Hospital Tests and Procedure/Radiological results at the follow up, please get all Hospital records sent to your Prim MD by signing hospital release before you go home.  If you had Pneumonia of Lung problems at the Hospital: Please get a 2 view Chest X ray done in 6-8 weeks after hospital discharge or sooner if instructed by your Primary MD.  If you have Congestive Heart Failure: Please call your Cardiologist or Primary MD anytime you have any of the following symptoms:  1) 3 pound weight gain in 24 hours or 5 pounds in 1 week  2) shortness of breath, with or without a dry hacking cough  3) swelling in the hands, feet or stomach  4) if you have to sleep on extra pillows at night in order to breathe  Follow cardiac low salt diet and 1.5 lit/day fluid restriction.  If you have diabetes Accuchecks 4 times/day, Once in AM empty stomach and then before each meal. Log in all results and show them to your primary doctor at your next visit. If any glucose reading is under 80 or above 300 call your primary MD immediately.  If you have Seizure/Convulsions/Epilepsy: Please do not drive, operate heavy machinery, participate in activities at heights or participate in high speed sports until you have seen by Primary MD or a Neurologist and advised to do so again.  If you had Gastrointestinal Bleeding: Please ask your Primary MD to check a complete blood count within one week of discharge or at your next visit. Your endoscopic/colonoscopic biopsies that are pending at the time of discharge, will also need to followed by your Primary MD.  Get Medicines reviewed and adjusted. Please take all your medications with you for your next visit with your Primary MD  Please request your Primary MD to go over all hospital tests and procedure/radiological results at the  follow up, please ask your Primary MD to get all Hospital records sent to his/her office.  If you experience worsening of your admission symptoms, develop shortness of breath, life threatening emergency, suicidal or homicidal thoughts you must seek medical attention immediately by calling 911 or calling your MD immediately  if symptoms less severe.  You must read complete instructions/literature along with all the possible adverse reactions/side effects for all the Medicines you take and that have been prescribed to you. Take any new Medicines after you have completely understood and accpet all the possible adverse reactions/side effects.   Do not drive or operate heavy machinery when taking Pain medications.   Do  not take more than prescribed Pain, Sleep and Anxiety Medications  Special Instructions: If you have smoked or chewed Tobacco  in the last 2 yrs please stop smoking, stop any regular Alcohol  and or any Recreational drug use.  Wear Seat belts while driving.  Please note You were cared for by a hospitalist during your hospital stay. If you have any questions about your discharge medications or the care you received while you were in the hospital after you are discharged, you can call the unit and asked to speak with the hospitalist on call if the hospitalist that took care of you is not available. Once you are discharged, your primary care physician will handle any further medical issues. Please note that NO REFILLS for any discharge medications will be authorized once you are discharged, as it is imperative that you return to your primary care physician (or establish a relationship with a primary care physician if you do not have one) for your aftercare needs so that they can reassess your need for medications and monitor your lab values.  You can reach the hospitalist office at phone [phone] or fax [phone]   If you do not have a primary care physician, you can call [phone]  for a physician referral.

## 2020-11-09 NOTE — Telephone Encounter (Signed)
Patient given detailed instructions per Myocardial Perfusion Study Information Sheet for the test on 11/12/20. Patient notified to arrive 15 minutes early and that it is imperative to arrive on time for appointment to keep from having the test rescheduled.  If you need to cancel or reschedule your appointment, please call the office within 24 hours of your appointment. . Patient verbalized understanding. Erin Mitchell Jacqueline    

## 2020-11-09 NOTE — Progress Notes (Signed)
Progress Note  5 Days Post-Op  Subjective: CC: feeling well. No further diarrhea and tolerating diet without abdominal pain, nausea, emesis. Ambulating    Objective: Vital signs in last 24 hours: Temp:  [97.7 F (36.5 C)-98.5 F (36.9 C)] 98.3 F (36.8 C) (06/06 0434) Pulse Rate:  [71-94] 71 (06/06 0434) Resp:  [15-18] 17 (06/06 0434) BP: (135-166)/(75-84) 166/84 (06/06 0434) SpO2:  [96 %-99 %] 96 % (06/06 0434) Last BM Date: 11/07/20  Intake/Output from previous day: 06/05 0701 - 06/06 0700 In: 1640 [P.O.:1640] Out: -  Intake/Output this shift: No intake/output data recorded.  PE: General: pleasant, WD,female who is laying in bed in NAD HEENT: head is normocephalic, atraumatic.  Mouth is pink and moist Heart: No cyanosis. Palpable radial and pedal pulses bilaterally Lungs: Respiratory effort nonlabored  Abd: soft, NT, ND, +BS, ventral hernia reduces easily and completely. Well healed midline surgical scar MS: calves non tender to palpation bilaterally Skin: warm and dry with no masses, lesions, or rashes Psych: A&Ox3 with an appropriate affect.    Lab Results:  Recent Labs    11/07/20 0120  WBC 13.1*  HGB 11.1*  HCT 32.7*  PLT 265   BMET Recent Labs    11/07/20 0120 11/08/20 0123  NA 135 134*  K 4.0 3.8  CL 108 108  CO2 18* 19*  GLUCOSE 92 99  BUN 20 13  CREATININE 0.78 0.66  CALCIUM 7.9* 7.9*   PT/INR No results for input(s): LABPROT, INR in the last 72 hours. CMP     Component Value Date/Time   NA 134 (L) 11/08/2020 0123   K 3.8 11/08/2020 0123   CL 108 11/08/2020 0123   CO2 19 (L) 11/08/2020 0123   GLUCOSE 99 11/08/2020 0123   BUN 13 11/08/2020 0123   CREATININE 0.66 11/08/2020 0123   CREATININE 0.88 11/03/2020 1139   CALCIUM 7.9 (L) 11/08/2020 0123   PROT 6.3 (L) 11/04/2020 0454   ALBUMIN 3.5 11/04/2020 0454   AST 19 11/04/2020 0454   ALT 19 11/04/2020 0454   ALKPHOS 71 11/04/2020 0454   BILITOT 1.1 11/04/2020 0454   GFRNONAA  >60 11/08/2020 0123   GFRNONAA 74 10/08/2020 0000   GFRAA 85 10/08/2020 0000   Lipase     Component Value Date/Time   LIPASE 32 11/03/2020 1820       Studies/Results: No results found.  Anti-infectives: Anti-infectives (From admission, onward)   Start     Dose/Rate Route Frequency Ordered Stop   11/05/20 0400  cefTRIAXone (ROCEPHIN) 1 g in sodium chloride 0.9 % 100 mL IVPB  Status:  Discontinued        1 g 200 mL/hr over 30 Minutes Intravenous Every 24 hours 11/04/20 0402 11/04/20 1218   11/04/20 0445  cefTRIAXone (ROCEPHIN) 1 g in sodium chloride 0.9 % 100 mL IVPB        1 g 200 mL/hr over 30 Minutes Intravenous  Once 11/04/20 0439 11/04/20 0550       Assessment/Plan  Incarcerated ventral hernia with SBO Diarrhea - resolved - hernia was mechanically reduced by Dr. Bedelia Person on admission - she has had diarrhea - GI paneland C. Diff both negative, GIrecommends continuing supportive treatment. Resolved.  -CT 6/3 shows reduced ventral hernia and some thickened distal small bowel but contrast in colon - patient continues to feel much improved - abdominal binder when OOB - tolerating soft diet and hernia remains reducible. She will follow up with Dr. Gerrit Friends outpatient to discuss elective surgery.  Signs/symptoms of incarceration/strangulation discussed with patient this am  BOF:BPZW diet  VTE: can resume plavix from surgical standpoint ID: no current abx  - below per primary attending -  Hypothyroidism HTN Atrial fibrillation - plavix on hold,cards recommendinga Lexiscan nuclear study for risk stratification preoperatively Peripheral vascular disease AKI T2DM    LOS: 6 days    Eric Form, [facility] 11/09/2020, 9:56 AM Please see Amion for pager number during day hours 7:00am-4:30pm

## 2020-11-12 ENCOUNTER — Ambulatory Visit (HOSPITAL_COMMUNITY): Payer: Medicare HMO | Attending: Internal Medicine

## 2020-11-12 ENCOUNTER — Other Ambulatory Visit: Payer: Self-pay

## 2020-11-12 DIAGNOSIS — Z01810 Encounter for preprocedural cardiovascular examination: Secondary | ICD-10-CM | POA: Diagnosis not present

## 2020-11-12 DIAGNOSIS — I739 Peripheral vascular disease, unspecified: Secondary | ICD-10-CM | POA: Insufficient documentation

## 2020-11-12 LAB — MYOCARDIAL PERFUSION IMAGING
LV dias vol: 81 mL (ref 46–106)
LV sys vol: 25 mL
Peak HR: 93 {beats}/min
Rest HR: 75 {beats}/min
SDS: 3
SRS: 4
SSS: 8
TID: 0.97

## 2020-11-12 MED ORDER — REGADENOSON 0.4 MG/5ML IV SOLN
0.4000 mg | Freq: Once | INTRAVENOUS | Status: AC
  Administered 2020-11-12: 0.4 mg via INTRAVENOUS

## 2020-11-12 MED ORDER — TECHNETIUM TC 99M TETROFOSMIN IV KIT
31.4000 | PACK | Freq: Once | INTRAVENOUS | Status: AC | PRN
  Administered 2020-11-12: 31.4 via INTRAVENOUS
  Filled 2020-11-12: qty 32

## 2020-11-12 MED ORDER — TECHNETIUM TC 99M TETROFOSMIN IV KIT
11.0000 | PACK | Freq: Once | INTRAVENOUS | Status: AC | PRN
  Administered 2020-11-12: 11 via INTRAVENOUS
  Filled 2020-11-12: qty 11

## 2020-11-13 ENCOUNTER — Inpatient Hospital Stay: Payer: Medicare HMO | Admitting: Family Medicine

## 2020-11-13 ENCOUNTER — Telehealth: Payer: Self-pay

## 2020-11-13 NOTE — Telephone Encounter (Signed)
Returned patient's call.   Rescheduled for 6/15

## 2020-11-18 ENCOUNTER — Encounter: Payer: Self-pay | Admitting: Family Medicine

## 2020-11-18 ENCOUNTER — Other Ambulatory Visit: Payer: Self-pay

## 2020-11-18 ENCOUNTER — Ambulatory Visit (INDEPENDENT_AMBULATORY_CARE_PROVIDER_SITE_OTHER): Payer: Medicare HMO | Admitting: Family Medicine

## 2020-11-18 VITALS — BP 128/66 | HR 72 | Temp 97.9°F | Wt 149.0 lb

## 2020-11-18 DIAGNOSIS — K436 Other and unspecified ventral hernia with obstruction, without gangrene: Secondary | ICD-10-CM | POA: Diagnosis not present

## 2020-11-18 DIAGNOSIS — I10 Essential (primary) hypertension: Secondary | ICD-10-CM

## 2020-11-18 DIAGNOSIS — K56609 Unspecified intestinal obstruction, unspecified as to partial versus complete obstruction: Secondary | ICD-10-CM | POA: Diagnosis not present

## 2020-11-18 DIAGNOSIS — N179 Acute kidney failure, unspecified: Secondary | ICD-10-CM | POA: Diagnosis not present

## 2020-11-18 NOTE — Patient Instructions (Signed)
Bland Diet A bland diet consists of foods that are often soft and do not have a lot of fat, fiber, or extra seasonings. Foods without fat, fiber, or seasoning are easier for the body to digest. They are also less likely to irritate your mouth, throat, stomach, and other parts of your digestive system. A bland dietis sometimes called a BRAT diet. What is my plan? Your health care provider or food and nutrition specialist (dietitian) may recommend specific changes to your diet to prevent symptoms or to treat your symptoms. These changes may include: Eating small meals often. Cooking food until it is soft enough to chew easily. Chewing your food well. Drinking fluids slowly. Not eating foods that are very spicy, sour, or fatty. Not eating citrus fruits, such as oranges and grapefruit. What do I need to know about this diet? Eat a variety of foods from the bland diet food list. Do not follow a bland diet longer than needed. Ask your health care provider whether you should take vitamins or supplements. What foods can I eat? Grains  Hot cereals, such as cream of wheat. Rice. Bread, crackers, or tortillas madefrom refined white flour. Vegetables Canned or cooked vegetables. Mashed or boiled potatoes. Fruits  Bananas. Applesauce. Other types of cooked or canned fruit with the skin andseeds removed, such as canned peaches or pears. Meats and other proteins  Scrambled eggs. Creamy peanut butter or other nut butters. Lean, well-cookedmeats, such as chicken or fish. Tofu. Soups or broths. Dairy Low-fat dairy products, such as milk, cottage cheese, or yogurt. Beverages  Water. Herbal tea. Apple juice. Fats and oils Mild salad dressings. Canola or olive oil. Sweets and desserts Pudding. Custard. Fruit gelatin. Ice cream. The items listed above may not be a complete list of recommended foods and beverages. Contact a dietitian for more options. What foods are not recommended? Grains Whole grain  breads and cereals. Vegetables Raw vegetables. Fruits Raw fruits, especially citrus, berries, or dried fruits. Dairy Whole fat dairy foods. Beverages Caffeinated drinks. Alcohol. Seasonings and condiments Strongly flavored seasonings or condiments. Hot sauce. Salsa. Other foods Spicy foods. Fried foods. Sour foods, such as pickled or fermented foods. Foodswith high sugar content. Foods high in fiber. The items listed above may not be a complete list of foods and beverages to avoid. Contact a dietitian for more information. Summary A bland diet consists of foods that are often soft and do not have a lot of fat, fiber, or extra seasonings. Foods without fat, fiber, or seasoning are easier for the body to digest. Check with your health care provider to see how long you should follow this diet plan. It is not meant to be followed for long periods. This information is not intended to replace advice given to you by your health care provider. Make sure you discuss any questions you have with your healthcare provider. Document Revised: 06/21/2017 Document Reviewed: 06/21/2017 Elsevier Patient Education  2022 Elsevier Inc.    Low-FODMAP Eating Plan  FODMAP stands for fermentable oligosaccharides, disaccharides, monosaccharides, and polyols. These are sugars that are hard for some people to digest. A low-FODMAP eating plan may help some people who have irritable bowel syndrome (IBS) and certain other bowel (intestinal) diseases to manage their symptoms. This meal plan can be complicated to follow. Work with a diet and nutrition specialist (dietitian) to make a low-FODMAP eating plan that is right for you. A dietitian can helpmake sure that you get enough nutrition from this diet. What are tips for  following this plan? Reading food labels Check labels for hidden FODMAPs such as: High-fructose syrup. Honey. Agave. Natural fruit flavors. Onion or garlic powder. Choose low-FODMAP foods that  contain 3-4 grams of fiber per serving. Check food labels for serving sizes. Eat only one serving at a time to make sure FODMAP levels stay low. Shopping Shop with a list of foods that are recommended on this diet and make a meal plan. Meal planning Follow a low-FODMAP eating plan for up to 6 weeks, or as told by your health care provider or dietitian. To follow the eating plan: Eliminate high-FODMAP foods from your diet completely. Choose only low-FODMAP foods to eat. You will do this for 2-6 weeks. Gradually reintroduce high-FODMAP foods into your diet one at a time. Most people should wait a few days before introducing the next new high-FODMAP food into their meal plan. Your dietitian can recommend how quickly you may reintroduce foods. Keep a daily record of what and how much you eat and drink. Make note of any symptoms that you have after eating. Review your daily record with a dietitian regularly to identify which foods you can eat and which foods you should avoid. General tips Drink enough fluid each day to keep your urine pale yellow. Avoid processed foods. These often have added sugar and may be high in FODMAPs. Avoid most dairy products, whole grains, and sweeteners. Work with a dietitian to make sure you get enough fiber in your diet. Avoid high FODMAP foods at meals to manage symptoms. Recommended foods Fruits Bananas, oranges, tangerines, lemons, limes, blueberries, raspberries, strawberries, grapes, cantaloupe, honeydew melon, kiwi, papaya, passion fruit, and pineapple. Limited amounts of dried cranberries, banana chips, and shreddedcoconut. Vegetables Eggplant, zucchini, cucumber, peppers, green beans, bean sprouts, lettuce, arugula, kale, Swiss chard, spinach, collard greens, bok choy, summer squash, potato, and tomato. Limited amounts of corn, carrot, and sweet potato. Greenparts of scallions. Grains Gluten-free grains, such as rice, oats, buckwheat, quinoa, corn, polenta,  andmillet. Gluten-free pasta, bread, or cereal. Rice noodles. Corn tortillas. Meats and other proteins Unseasoned beef, pork, poultry, or fish. Eggs. Tomasa Blase. Tofu (firm) and tempeh. Limited amounts of nuts and seeds, such as almonds, walnuts, [place] nuts,pecans, peanuts, nut butters, pumpkin seeds, chia seeds, and sunflower seeds. Dairy Lactose-free milk, yogurt, and kefir. Lactose-free cottage cheese and ice cream. Non-dairy milks, such as almond, coconut, hemp, and rice milk. Non-dairy yogurt. Limited amounts of goat cheese, brie, mozzarella, parmesan, swiss, andother hard cheeses. Fats and oils Butter-free spreads. Vegetable oils, such as olive, canola, and sunflower oil. Seasoning and other foods Artificial sweeteners with names that do not end in "ol," such as aspartame, saccharine, and stevia. Maple syrup, white table sugar, raw sugar, brown sugar, and molasses. Mayonnaise, soy sauce, and tamari. Fresh basil, coriander,parsley, rosemary, and thyme. Beverages Water and mineral water. Sugar-sweetened soft drinks. Small amounts of orangejuice or cranberry juice. Black and green tea. Most dry wines. Coffee. The items listed above may not be a complete list of foods and beverages you can eat. Contact a dietitian for more information. Foods to avoid Fruits Fresh, dried, and juiced forms of apple, pear, watermelon, peach, plum, cherries, apricots, blackberries, boysenberries, figs, nectarines, and mango.Avocado. Vegetables Chicory root, artichoke, asparagus, cabbage, snow peas, Brussels sprouts, broccoli, sugar snap peas, mushrooms, celery, and cauliflower. Onions, garlic,leeks, and the white part of scallions. Grains Wheat, including kamut, durum, and semolina. Barley and bulgur. Couscous.Wheat-based cereals. Wheat noodles, bread, crackers, and pastries. Meats and other proteins Fried or fatty meat.  Sausage. Cashews and pistachios. Soybeans, baked beans, black beans, chickpeas, kidney beans, fava  beans, navy beans, lentils,black-eyed peas, and split peas. Dairy Milk, yogurt, ice cream, and soft cheese. Cream and sour cream. Milk-basedsauces. Custard. Buttermilk. Soy milk. Seasoning and other foods Any sugar-free gum or candy. Foods that contain artificial sweeteners such as sorbitol, mannitol, isomalt, or xylitol. Foods that contain honey, high-fructose corn syrup, or agave. Bouillon, vegetable stock, beef stock, and chicken stock. Garlic and onion powder. Condiments made with onion, such ashummus, chutney, pickles, relish, salad dressing, and salsa. Tomato paste. Beverages Chicory-based drinks. Coffee substitutes. Chamomile tea. Fennel tea. Sweet or fortified wines such as port or sherry. Diet soft drinks made with isomalt, mannitol, maltitol, sorbitol, or xylitol. Apple, pear, and mango juice. Juiceswith high-fructose corn syrup. The items listed above may not be a complete list of foods and beverages you should avoid. Contact a dietitian for more information. Summary FODMAP stands for fermentable oligosaccharides, disaccharides, monosaccharides, and polyols. These are sugars that are hard for some people to digest. A low-FODMAP eating plan is a short-term diet that helps to ease symptoms of certain bowel diseases. The eating plan usually lasts up to 6 weeks. After that, high-FODMAP foods are reintroduced gradually and one at a time. This can help you find out which foods may be causing symptoms. A low-FODMAP eating plan can be complicated. It is best to work with a dietitian who has experience with this type of plan. This information is not intended to replace advice given to you by your health care provider. Make sure you discuss any questions you have with your healthcare provider. Document Revised: 10/10/2019 Document Reviewed: 10/10/2019 Elsevier Patient Education  2022 [employer].

## 2020-11-18 NOTE — Assessment & Plan Note (Signed)
Rechecking renal function. 

## 2020-11-18 NOTE — Assessment & Plan Note (Signed)
Has upcoming surgery for repair.

## 2020-11-18 NOTE — Progress Notes (Signed)
Erin Mitchell - 75 y.o. female MRN [id number]  Date of birth: 07-16-1945  Subjective Chief Complaint  Patient presents with   Hospitalization Follow-up    HPI Erin Mitchell is a 75 y.o. female here today for hospital follow up.  She was seen at urgent care recently due to severe diarrhea.  Initially thought to have diverticulitis.  Had CT scan showing SBO due to incarcerated loop of small bowel in periumbilical hernia.  She was admitted to hospital and had surgical consult.  Treated conservatively with NG tube to low suction and IV fluids.  Hernia was also reduced manually.  Repeat CT showed severe enteritis but resolution of SBO.  Diet was advanced as tolerated.  Her diarrhea had improved but had increased some again last week.  She does have history of chronic intermittent diarrhea prior to this. C. Diff pcr negative.  She is planning on having hernia repair in the next week or two.    She did also have AKI with creatinine rising to 2.01.  This improved with holding lisinopril and fluids.  She has not resumed lisinopril at this point and BP has remained well controlled.    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    Social History   Socioeconomic History   Marital status: Married    Spouse name: Not on file   Number of children: Not on file   Years of education: Not on file   Highest education level: Not on file  Occupational History   Occupation: Retired  Tobacco Use   Smoking status: Former    Packs/day: 0.25    Years: 1.00     Pack years: 0.25    Types: Cigarettes    Quit date: 06/06/1970    Years since quitting: 50.4   Smokeless tobacco: Never  Vaping Use   Vaping Use: Never used  Substance and Sexual Activity   Alcohol use: Never   Drug use: Never   Sexual activity: Not on file  Other Topics Concern   Not on file  Social History Narrative   Not on file   Social Determinants of Health   Financial Resource Strain: Not on file  Food Insecurity: Not on file  Transportation Needs: Not on file  Physical Activity: Not on file  Stress: Not on file  Social Connections: Not on file    Family History  Problem Relation Age of Onset   Hypertension Father    Diabetes Brother    Emphysema Mother     Health Maintenance  Topic Date Due   OPHTHALMOLOGY EXAM  Never done   URINE MICROALBUMIN  Never done   Hepatitis C Screening  Never done   Zoster Vaccines- Shingrix (1 of 2) Never done   COVID-19 Vaccine (3 - Booster for Pfizer series) 12/28/2019   PNA vac Low Risk Adult (2 of 2 - PPSV23) 01/22/2020   INFLUENZA VACCINE  01/04/2021  HEMOGLOBIN A1C  04/10/2021   FOOT EXAM  10/16/2021   TETANUS/TDAP  08/15/2029   HPV VACCINES  Aged Out   MAMMOGRAM  Discontinued   DEXA SCAN  Discontinued   COLONOSCOPY (Pts 45-96yrs Insurance coverage will need to be confirmed)  Discontinued     ----------------------------------------------------------------------------------------------------------------------------------------------------------------------------------------------------------------- Physical Exam BP 128/66 (BP Location: Left Arm, Patient Position: Sitting, Cuff Size: Small)   Pulse 72   Temp 97.9 F (36.6 C)   Wt 149 lb (67.6 kg)   SpO2 98%   BMI 25.58 kg/m   Physical Exam Constitutional:      Appearance: Normal appearance.  HENT:     Head: Normocephalic and atraumatic.     Mouth/Throat:     Mouth: Mucous membranes are moist.  Eyes:     General: No scleral icterus. Cardiovascular:      Rate and Rhythm: Normal rate and regular rhythm.  Pulmonary:     Effort: Pulmonary effort is normal.     Breath sounds: Normal breath sounds.  Abdominal:     General: Abdomen is flat. Bowel sounds are normal. There is no distension.     Palpations: Abdomen is soft.     Tenderness: There is no abdominal tenderness.  Musculoskeletal:     Cervical back: Neck supple.  Neurological:     General: No focal deficit present.     Mental Status: She is alert.  Psychiatric:        Mood and Affect: Mood normal.        Behavior: Behavior normal.    ------------------------------------------------------------------------------------------------------------------------------------------------------------------------------------------------------------------- Assessment and Plan  Hypertension She continues to hold lisinopril and BP remains well controlled.  Will continue to hold for now.  Update renal function.    SBO (small bowel obstruction) (HCC) Resolved however she continues to have diarrhea, this is slowly improving. Previous CT with severe enteritis.  Bland diet recommended. Continue probiotic and immodium as needed.    AKI (acute kidney injury) (HCC) Rechecking renal function.    Incarcerated ventral hernia Has upcoming surgery for repair.     No orders of the defined types were placed in this encounter.   No follow-ups on file.    This visit occurred during the SARS-CoV-2 public health emergency.  Safety protocols were in place, including screening questions prior to the visit, additional usage of staff PPE, and extensive cleaning of exam room while observing appropriate contact time as indicated for disinfecting solutions.

## 2020-11-18 NOTE — Assessment & Plan Note (Signed)
She continues to hold lisinopril and BP remains well controlled.  Will continue to hold for now.  Update renal function.

## 2020-11-18 NOTE — Assessment & Plan Note (Signed)
Resolved however she continues to have diarrhea, this is slowly improving. Previous CT with severe enteritis.  Bland diet recommended. Continue probiotic and immodium as needed.

## 2020-11-19 NOTE — Progress Notes (Signed)
HPI: FU diabetes mellitus, hypertension, chronic stage III kidney disease, peripheral vascular disease and recent preoperative evaluation prior to ventral hernia repair.  Previously cared for in [place].  She apparently had a bout of atrial fibrillation in the setting of osteomyelitis in the past.  She states she was not anticoagulated and had outpatient Holter that was unremarkable. Recently moved to this area and was admitted with SBO with incarcerated ventral hernia. This was subsequently reduced. Abdominal CT 6/22 suggested enteritis, ascites and "unusual uterus and right adnexa" and fu ultrasound recommended. Cardiology was asked to evaluate preoperatively as she will require surgery. She was ultimately DCed and had outpt nuclear study on 11/12/20 that showed EF 69, apical infarct, no ischemia. Echo 6/22 showed normal LV function, mild LVH, grade 1 DD. Since she was [facility] from the hospital, she denies dyspnea, chest pain, palpitations or syncope.  Her diarrhea has improved.  Current Outpatient Medications  Medication Sig Dispense Refill   Alcohol Swabs (B-D SINGLE USE SWABS REGULAR) PADS Use as directed. 100 each 3   Blood Glucose Monitoring Suppl (TRUE METRIX METER) w/Device KIT Use as directed to test blood glucose daily 1 kit 0   clopidogrel (PLAVIX) 75 MG tablet TAKE 1 TABLET BY MOUTH ONCE DAILY (Patient taking differently: Take 75 mg by mouth in the morning.) 90 tablet 3   glucose blood (TRUE METRIX BLOOD GLUCOSE TEST) test strip Use as instructed 100 each 12   loperamide (IMODIUM A-D) 2 MG tablet Take 2 mg by mouth 4 (four) times daily as needed for diarrhea or loose stools.     metFORMIN (GLUCOPHAGE) 500 MG tablet Take 1 tablet (500 mg total) by mouth 2 (two) times daily with a meal. (Patient taking differently: Take 1,000 mg by mouth 2 (two) times daily with a meal.) 90 tablet 3   metoprolol tartrate (LOPRESSOR) 25 MG tablet TAKE 1 TABLET BY MOUTH TWICE DAILY WITH FOOD (Patient taking  differently: Take 25 mg by mouth 2 (two) times daily with a meal.) 90 tablet 3   NP THYROID 90 MG tablet Take 90 mg by mouth daily before breakfast.     rosuvastatin (CRESTOR) 20 MG tablet TAKE 1 TABLET BY MOUTH ONCE DAILY (Patient taking differently: Take 20 mg by mouth at bedtime.) 90 tablet 3   saccharomyces boulardii (FLORASTOR) 250 MG capsule Take 1 capsule (250 mg total) by mouth 2 (two) times daily. 60 capsule 0   TRUEplus Lancets 33G MISC Use as directed daily. 100 each 3   No current facility-administered medications for this visit.     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    Social History   Socioeconomic History   Marital status: Married    Spouse name: Not on file   Number of children: Not on file   Years of education: Not on file   Highest education level: Not on file  Occupational History   Occupation: Retired  Tobacco Use   Smoking status: Former    Packs/day:  0.25    Years: 1.00    Pack years: 0.25    Types: Cigarettes    Quit date: 06/06/1970    Years since quitting: 50.4   Smokeless tobacco: Never  Vaping Use   Vaping Use: Never used  Substance and Sexual Activity   Alcohol use: Never   Drug use: Never   Sexual activity: Not on file  Other Topics Concern   Not on file  Social History Narrative   Not on file   Social Determinants of Health   Financial Resource Strain: Not on file  Food Insecurity: Not on file  Transportation Needs: Not on file  Physical Activity: Not on file  Stress: Not on file  Social Connections: Not on file  Intimate Partner Violence: Not on file    Family History  Problem  Relation Age of Onset   Hypertension Father    Diabetes Brother    Emphysema Mother     ROS: no fevers or chills, productive cough, hemoptysis, dysphasia, odynophagia, melena, hematochezia, dysuria, hematuria, rash, seizure activity, orthopnea, PND, pedal edema, claudication. Remaining systems are negative.  Physical Exam: Well-developed well-nourished in no acute distress.  Skin is warm and dry.  HEENT is normal.  Neck is supple.  Chest is clear to auscultation with normal expansion.  Cardiovascular exam is regular rate and rhythm.  Abdominal exam nontender or distended. No masses palpated. Extremities show no edema. neuro grossly intact  ECG- personally reviewed  A/P  1 preoperative evaluation prior to ventral hernia repair-I have personally reviewed patients nuclear study and echo; there is prior infarct on nuclear study and apical wall motion on echo with preserved LV function.  However there is no ischemia noted in the remaining vascular territories and she is not having chest pain.  I think she can proceed with ventral hernia repair (given recent incarcerated hernia I would like to not delay this).  Would continue present medications.  Postoperatively.  Hold Plavix 5 days prior to procedure.   2 history of atrial fibrillation-this apparently occurred in the setting of osteomyelitis.  She has had no recurrences and is not anticoagulated.   3 peripheral vascular disease-no claudication.  Continue medical therapy with Plavix and statin.   4 incarcerated hernia-status postreduction. Will need repair of ventral hernia; per general surgery.  5 hyperlipidemia-given documented vascular disease will increase Crestor to 40 mg daily.  Check lipids and liver in 12 weeks.  6 coronary artery disease-based on abnormal nuclear study showing small prior apical infarct.  She is not having chest pain.  Plan medical therapy at this point.  Continue aspirin and statin.  We will consider  catheterization in the future if she develops symptoms.  7 hypertension-blood pressure controlled.  Her lisinopril was discontinued in the hospital due to acute renal insufficiency.  This resolved.  We will therefore treat with losartan 50 mg daily.  Check potassium and renal function in 1 week.  Follow blood pressure and advance medications as needed.  8 abnormal CT with unusual appearance of right adnexa-follow-up primary care.  Kirk Ruths, MD

## 2020-11-20 ENCOUNTER — Encounter: Payer: Self-pay | Admitting: Cardiology

## 2020-11-20 ENCOUNTER — Encounter (INDEPENDENT_AMBULATORY_CARE_PROVIDER_SITE_OTHER): Payer: Medicare HMO | Admitting: Ophthalmology

## 2020-11-20 ENCOUNTER — Ambulatory Visit: Payer: Medicare HMO | Admitting: Cardiology

## 2020-11-20 ENCOUNTER — Other Ambulatory Visit: Payer: Self-pay

## 2020-11-20 VITALS — BP 164/76 | HR 70 | Ht 64.0 in | Wt 148.6 lb

## 2020-11-20 DIAGNOSIS — N179 Acute kidney failure, unspecified: Secondary | ICD-10-CM | POA: Diagnosis not present

## 2020-11-20 DIAGNOSIS — Z01810 Encounter for preprocedural cardiovascular examination: Secondary | ICD-10-CM

## 2020-11-20 DIAGNOSIS — K56609 Unspecified intestinal obstruction, unspecified as to partial versus complete obstruction: Secondary | ICD-10-CM | POA: Diagnosis not present

## 2020-11-20 DIAGNOSIS — E785 Hyperlipidemia, unspecified: Secondary | ICD-10-CM

## 2020-11-20 MED ORDER — ROSUVASTATIN CALCIUM 40 MG PO TABS: ORAL_TABLET | ORAL | 3 refills | Status: DC

## 2020-11-20 MED ORDER — LOSARTAN POTASSIUM 50 MG PO TABS: 50.0000 mg | ORAL_TABLET | Freq: Every day | ORAL | 3 refills | Status: DC

## 2020-11-20 NOTE — Patient Instructions (Signed)
Medication Instructions:   START LOSARTAN 50 MG ONCE DAILY  INCREASE ROSUVASTATIN TO 40 MG ONCE DAILY= 2 OF THE 20 MG TABLETS ONCE DAILY  *If you need a refill on your cardiac medications before your next appointment, please call your pharmacy*   Lab Work:  Your physician recommends that you return for lab work in: ONE WEEK  Your physician recommends that you return for lab work in: 12 WEEKS=FASTING  If you have labs (blood work) drawn today and your tests are completely normal, you will receive your results only by: [employer] (if you have MyChart) OR A paper copy in the mail If you have any lab test that is abnormal or we need to change your treatment, we will call you to review the results.   Follow-Up: At [facility], you and your health needs are our priority.  As part of our continuing mission to provide you with exceptional heart care, we have created designated Provider Care Teams.  These Care Teams include your primary Cardiologist (physician) and Advanced Practice Providers (APPs -  Physician Assistants and Nurse Practitioners) who all work together to provide you with the care you need, when you need it.  We recommend signing up for the patient portal called "MyChart".  Sign up information is provided on this After Visit Summary.  MyChart is used to connect with patients for Virtual Visits (Telemedicine).  Patients are able to view lab/test results, encounter notes, upcoming appointments, etc.  Non-urgent messages can be sent to your provider as well.   To learn more about what you can do with MyChart, go to [email or web address].    Your next appointment:   6 month(s)  The format for your next appointment:   In Person  Provider:   Olga Millers, MD

## 2020-11-21 LAB — CBC WITH DIFFERENTIAL/PLATELET
Absolute Monocytes: 686 cells/uL (ref 200–950)
Basophils Absolute: 47 cells/uL (ref 0–200)
Basophils Relative: 0.5 %
Eosinophils Absolute: 385 cells/uL (ref 15–500)
Eosinophils Relative: 4.1 %
HCT: 34.9 % — ABNORMAL LOW (ref 35.0–45.0)
Hemoglobin: 11 g/dL — ABNORMAL LOW (ref 11.7–15.5)
Lymphs Abs: 2124 cells/uL (ref 850–3900)
MCH: 28.3 pg (ref 27.0–33.0)
MCHC: 31.5 g/dL — ABNORMAL LOW (ref 32.0–36.0)
MCV: 89.7 fL (ref 80.0–100.0)
MPV: 10.2 fL (ref 7.5–12.5)
Monocytes Relative: 7.3 %
Neutro Abs: 6157 cells/uL (ref 1500–7800)
Neutrophils Relative %: 65.5 %
Platelets: 284 10*3/uL (ref 140–400)
RBC: 3.89 10*6/uL (ref 3.80–5.10)
RDW: 13 % (ref 11.0–15.0)
Total Lymphocyte: 22.6 %
WBC: 9.4 10*3/uL (ref 3.8–10.8)

## 2020-11-21 LAB — BASIC METABOLIC PANEL
BUN: 15 mg/dL (ref 7–25)
CO2: 22 mmol/L (ref 20–32)
Calcium: 8.6 mg/dL (ref 8.6–10.4)
Chloride: 106 mmol/L (ref 98–110)
Creat: 0.84 mg/dL (ref 0.60–0.93)
Glucose, Bld: 190 mg/dL — ABNORMAL HIGH (ref 65–139)
Potassium: 4.4 mmol/L (ref 3.5–5.3)
Sodium: 139 mmol/L (ref 135–146)

## 2020-11-24 NOTE — Progress Notes (Signed)
[facility] Note  11/25/2020     CHIEF COMPLAINT Patient presents for Diabetic Eye Exam   HISTORY OF PRESENT ILLNESS: Erin Mitchell is a 75 y.o. female who presents to the clinic today for:   HPI     Diabetic Eye Exam   Vision is stable and is blurred for distance.  Associated Symptoms Floaters.  Diabetes characteristics include Type 2, controlled with diet and taking oral medications.  This started 20 years ago.  Blood sugar level is controlled.  Last Blood Glucose 124 (This a.m.).  Last A1C 6.8 (4.10.22).  Associated Diagnosis Kidney Disease and Neuropathy.  I, the attending physician,  performed the HPI with the patient and updated documentation appropriately.        Comments   75 y/o female pt here for DEE.  Recently moved to [facility] from [place] 3 mos ago.  Previous pt of the [facility].  Hx of ocular injections for BDR OU; most recently only OD has been being treated Alfonse Flavors).  VA good OU, but blurred OD.  Denies pain, FOL.  Has had small floaters OU for years.  No gtts.      Last edited by Bernarda Caffey, MD on 11/25/2020 11:33 PM.    Pt recently moved here from [facility] where she was receiving injections OD of Eylea Q3 months, her last injection was in either February or March, pts previous retina specialist was Dr. Theora Gianotti at [facility]  Referring physician: Luetta Nutting, [facility] [address] [place],  [place] [address]  HISTORICAL INFORMATION:   Selected notes from the MEDICAL RECORD NUMBER Referred by Dr. Luetta Nutting for DM exam LEE:  Ocular Hx- PMH- DM, HTN, afib, kidney disease, last a1c was 6.8 on 05.05.22    CURRENT MEDICATIONS: No current outpatient medications on file. (Ophthalmic Drugs)   No current facility-administered medications for this visit. (Ophthalmic Drugs)   Current Outpatient Medications (Other)  Medication Sig   Alcohol Swabs (B-D SINGLE USE SWABS REGULAR) PADS Use as directed.    Blood Glucose Monitoring Suppl (TRUE METRIX METER) w/Device KIT Use as directed to test blood glucose daily   clopidogrel (PLAVIX) 75 MG tablet TAKE 1 TABLET BY MOUTH ONCE DAILY (Patient taking differently: Take 75 mg by mouth in the morning.)   glucose blood (TRUE METRIX BLOOD GLUCOSE TEST) test strip Use as instructed   loperamide (IMODIUM A-D) 2 MG tablet Take 2 mg by mouth 4 (four) times daily as needed for diarrhea or loose stools.   losartan (COZAAR) 50 MG tablet Take 1 tablet (50 mg total) by mouth daily.   metFORMIN (GLUCOPHAGE) 500 MG tablet Take 1 tablet (500 mg total) by mouth 2 (two) times daily with a meal. (Patient taking differently: Take 1,000 mg by mouth 2 (two) times daily with a meal.)   metoprolol tartrate (LOPRESSOR) 25 MG tablet TAKE 1 TABLET BY MOUTH TWICE DAILY WITH FOOD (Patient taking differently: Take 25 mg by mouth 2 (two) times daily with a meal.)   NP THYROID 90 MG tablet Take 90 mg by mouth daily before breakfast.   rosuvastatin (CRESTOR) 40 MG tablet TAKE 1 TABLET BY MOUTH ONCE DAILY   saccharomyces boulardii (FLORASTOR) 250 MG capsule Take 1 capsule (250 mg total) by mouth 2 (two) times daily.   TRUEplus Lancets 33G MISC Use as directed daily.   No current facility-administered medications for this visit. (Other)      REVIEW OF SYSTEMS:  ROS   Positive for: Gastrointestinal, Genitourinary, Endocrine, Cardiovascular, Eyes Negative for: Constitutional, Neurological, Skin, Musculoskeletal, HENT, Respiratory, Psychiatric, Allergic/Imm, Heme/Lymph Last edited by Matthew Folks, COA on 11/25/2020  9:06 AM.       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    FAMILY HISTORY Family History  Problem Relation Age of Onset   Hypertension Father    Diabetes Brother    Emphysema Mother     SOCIAL HISTORY Social History   Tobacco Use   Smoking status: Former    Packs/day: 0.25    Years: 1.00    Pack years: 0.25    Types: Cigarettes    Quit date: 06/06/1970    Years since quitting: 50.5   Smokeless tobacco: Never  Vaping Use   Vaping Use: Never used  Substance Use Topics   Alcohol use: Never   Drug use: Never         OPHTHALMIC EXAM:  Base Eye Exam     Visual Acuity (Snellen - Linear)       Right Left   Dist cc 20/40 -2 20/15 -2   Dist ph cc 20/40     Correction: Glasses         Tonometry (Tonopen, 9:10 AM)       Right Left   Pressure 17 14         Pupils       Dark Light Shape React APD   Right 3 2 Round Brisk None   Left 3 2 Round Brisk None         Visual Fields (Counting fingers)       Left Right    Full Full         Extraocular Movement       Right Left    Full, Ortho Full, Ortho         Neuro/Psych     Oriented x3: Yes   Mood/Affect: Normal         Dilation     Both eyes: 1.0% Mydriacyl, 2.5% Phenylephrine @ 9:10 AM           Slit Lamp and Fundus Exam     Slit Lamp Exam       Right Left   Lids/Lashes Dermatochalasis - upper lid, Meibomian gland dysfunction Dermatochalasis - upper lid, Meibomian gland dysfunction   Conjunctiva/Sclera White and quiet White and quiet   Cornea arcus, trace PEE arcus, trace PEE   Anterior Chamber deep, clear, narrow angles deep, clear, narrow angles   Iris Round and moderately dilated, mild anterior bowing Round and moderately dilated, mild anterior bowing   Lens 2-3+ Nuclear sclerosis with brunescence, 2+ Cortical cataract 2-3+ Nuclear sclerosis with brunescence, 2+ Cortical cataract          Fundus Exam       Right Left   Disc Pink and Sharp, +cupping Pink and Sharp, +cupping  C/D Ratio 0.7 0.65   Macula Blunted foveal reflex, central edema, RPE mottling, cluster of MA temporal to fovea Flat, Blunted foveal reflex, RPE mottling, scatterd MA, non-central cystic changes   Vessels attenuated, Tortuous attenuated, Tortuous   Periphery Attached, mild reticular degeneration, mild MA Attached, mild reticular degeneration, rare MA           Refraction     Wearing Rx       Sphere Cylinder Add   Right +1.25 Sphere +2.75   Left +1.50 Sphere +2.75    Age: 70 mos   Type: Bifocal         Manifest Refraction       Sphere Cylinder Dist VA   Right +1.50 Sphere 20/40   Left +1.50 Sphere 20/15-2            IMAGING AND PROCEDURES  Imaging and Procedures for 11/25/2020  OCT, Retina - OU - Both Eyes       Right Eye Quality was good. Central Foveal Thickness: 470. Progression has no prior data. Findings include abnormal foveal contour, intraretinal fluid, no SRF, vitreomacular adhesion .   Left Eye Quality was good. Central Foveal Thickness: 255. Progression has no prior data. Findings include abnormal foveal contour, no SRF, intraretinal fluid, vitreomacular adhesion (Non-central cystic changes temporal and inferior macula caught on widefield).   Notes *Images captured and stored on drive  Diagnosis / Impression:  OD: +central DME OS: Non-central cystic changes temporal and inferior macula caught on widefield  Clinical management:  See below  Abbreviations: NFP - Normal foveal profile. CME - cystoid macular edema. PED - pigment epithelial detachment. IRF - intraretinal fluid. SRF - subretinal fluid. EZ - ellipsoid zone. ERM - epiretinal membrane. ORA - outer retinal atrophy. ORT - outer retinal tubulation. SRHM - subretinal hyper-reflective material. IRHM - intraretinal hyper-reflective material      Intravitreal Injection, Pharmacologic Agent - OD -  Right Eye       Time Out 11/25/2020. 9:31 AM. Confirmed correct patient, procedure, site, and patient consented.   Anesthesia Topical anesthesia was used. Anesthetic medications included Lidocaine 2%, Proparacaine 0.5%.   Procedure Preparation included 5% betadine to ocular surface. A (32g) needle was used.   Injection: 2 mg aflibercept 2 MG/0.05ML   Route: Intravitreal, Site: Right Eye   NDC: 83254-982-64, Lot: [id number], Expiration date: 06/05/2021, Waste: 0.05 mL   Post-op Post injection exam found visual acuity of at least counting fingers. The patient tolerated the procedure well. There were no complications. The patient received written and verbal post procedure care education. Post injection medications were not given.   Notes **SAMPLE MEDICATION ADMINISTERED**              ASSESSMENT/PLAN:    ICD-10-CM   1. Moderate nonproliferative diabetic retinopathy of both eyes with macular edema associated with type 2 diabetes mellitus (HCC)  K08.8110 Intravitreal Injection, Pharmacologic Agent - OD - Right Eye    aflibercept (EYLEA) SOLN 2 mg    2. Retinal edema  H35.81 OCT, Retina - OU - Both Eyes    3. Essential hypertension  I10     4. Hypertensive retinopathy of both eyes  H35.033     5. Combined forms of age-related cataract of both eyes  H25.813       1,2. Moderate non-proliferative diabetic retinopathy, both eyes  - previously seen at [facility] by Dr. Theora Gianotti (Fax [phone]) - history of anti-VEGF injections since 2019 -- last injection IVE OD Feb/Mar  2022 -- q87mo interval - will try to obtain records from [facility] - The incidence, risk factors for progression, natural history and treatment options for diabetic retinopathy were discussed with patient.   - The need for close monitoring of blood glucose, blood pressure, and serum lipids, avoiding cigarette or any type of tobacco, and the need for long term follow up was also  discussed with patient. - exam w/ scattered MA - OCT shows diabetic macular edema, both eyes  - The natural history, pathology, and characteristics of diabetic macular edema discussed with patient.  A generalized discussion of the major clinical trials concerning treatment of diabetic macular edema (ETDRS, DCT, SCORE, RISE / RIDE, and ongoing DRCR net studies) was completed.  This discussion included mention of the various approaches to treating diabetic macular edema (observation, laser photocoagulation, anti-VEGF injections with lucentis / Avastin / Eylea, steroid injections with Kenalog / Ozurdex, and intraocular surgery with vitrectomy).  The goal hemoglobin A1C of 6-7 was discussed, as well as importance of smoking cessation and hypertension control.  Need for ongoing treatment and monitoring were specifically discussed with reference to chronic nature of diabetic macular edema. - recommend IVE #1 OD today, 06.22.22 -- sample -- need to verify Eylea benefits here - pt wishes to proceed - RBA of procedure discussed, questions answered - informed consent obtained and signed - see procedure note - f/u in 4-6 wks -- DFE/OCT, possible injection  3,4. Hypertensive retinopathy OU - discussed importance of tight BP control - monitor  5. Mixed Cataract OU - The symptoms of cataract, surgical options, and treatments and risks were discussed with patient. - discussed diagnosis and progression - not yet visually significant - monitor for now    Ophthalmic Meds Ordered this visit:  Meds ordered this encounter  Medications   aflibercept (EYLEA) SOLN 2 mg        Return in about 4 weeks (around 12/23/2020) for DME OD - Dilated Exam, OCT, Possible Injxn.  There are no Patient Instructions on file for this visit.   Explained the diagnoses, plan, and follow up with the patient and they expressed understanding.  Patient expressed understanding of the importance of proper follow up care.   This  document serves as a record of services personally performed by Gardiner Sleeper, MD, PhD. It was created on their behalf by Roselee Nova, COMT. The creation of this record is the provider's dictation and/or activities during the visit.  Electronically signed by: Roselee Nova, COMT 11/25/20 11:42 PM  This document serves as a record of services personally performed by Gardiner Sleeper, MD, PhD. It was created on their behalf by San Jetty. Owens Shark, OA an ophthalmic technician. The creation of this record is the provider's dictation and/or activities during the visit.    Electronically signed by: San Jetty. Owens Shark, [place] 06.22.2022 11:42 PM  Gardiner Sleeper, M.D., Ph.D. Diseases & Surgery of the Retina and Vitreous [facility]  I have reviewed the above documentation for accuracy and completeness, and I agree with the above. Gardiner Sleeper, M.D., Ph.D. 11/25/20 11:42 PM   Abbreviations: M myopia (nearsighted); A astigmatism; H hyperopia (farsighted); P presbyopia; Mrx spectacle prescription;  CTL contact lenses; OD right eye; OS left eye; OU both eyes  XT exotropia; ET esotropia; PEK punctate epithelial keratitis; PEE punctate epithelial erosions; DES dry eye syndrome; MGD meibomian gland dysfunction; ATs artificial tears; PFAT's preservative free artificial tears; [place] nuclear sclerotic cataract; PSC posterior subcapsular  cataract; ERM epi-retinal membrane; PVD posterior vitreous detachment; RD retinal detachment; DM diabetes mellitus; DR diabetic retinopathy; NPDR non-proliferative diabetic retinopathy; PDR proliferative diabetic retinopathy; CSME clinically significant macular edema; DME diabetic macular edema; dbh dot blot hemorrhages; CWS cotton wool spot; POAG primary open angle glaucoma; C/D cup-to-disc ratio; HVF humphrey visual field; GVF goldmann visual field; OCT optical coherence tomography; IOP intraocular pressure; BRVO Branch retinal vein occlusion; CRVO central retinal vein  occlusion; CRAO central retinal artery occlusion; BRAO branch retinal artery occlusion; RT retinal tear; SB scleral buckle; PPV pars plana vitrectomy; VH Vitreous hemorrhage; PRP panretinal laser photocoagulation; IVK intravitreal kenalog; VMT vitreomacular traction; MH Macular hole;  NVD neovascularization of the disc; NVE neovascularization elsewhere; AREDS age related eye disease study; ARMD age related macular degeneration; POAG primary open angle glaucoma; EBMD epithelial/anterior basement membrane dystrophy; ACIOL anterior chamber intraocular lens; IOL intraocular lens; PCIOL posterior chamber intraocular lens; Phaco/IOL phacoemulsification with intraocular lens placement; [place] photorefractive keratectomy; LASIK laser assisted in situ keratomileusis; HTN hypertension; DM diabetes mellitus; COPD chronic obstructive pulmonary disease

## 2020-11-25 ENCOUNTER — Ambulatory Visit (INDEPENDENT_AMBULATORY_CARE_PROVIDER_SITE_OTHER): Payer: Medicare HMO | Admitting: Ophthalmology

## 2020-11-25 ENCOUNTER — Other Ambulatory Visit: Payer: Self-pay

## 2020-11-25 ENCOUNTER — Encounter (INDEPENDENT_AMBULATORY_CARE_PROVIDER_SITE_OTHER): Payer: Self-pay | Admitting: Ophthalmology

## 2020-11-25 DIAGNOSIS — H35033 Hypertensive retinopathy, bilateral: Secondary | ICD-10-CM | POA: Diagnosis not present

## 2020-11-25 DIAGNOSIS — E113313 Type 2 diabetes mellitus with moderate nonproliferative diabetic retinopathy with macular edema, bilateral: Secondary | ICD-10-CM | POA: Diagnosis not present

## 2020-11-25 DIAGNOSIS — H25813 Combined forms of age-related cataract, bilateral: Secondary | ICD-10-CM | POA: Diagnosis not present

## 2020-11-25 DIAGNOSIS — I10 Essential (primary) hypertension: Secondary | ICD-10-CM

## 2020-11-25 DIAGNOSIS — H3581 Retinal edema: Secondary | ICD-10-CM | POA: Diagnosis not present

## 2020-11-25 MED ORDER — AFLIBERCEPT 2MG/0.05ML IZ SOLN FOR KALEIDOSCOPE
2.0000 mg | INTRAVITREAL | Status: AC | PRN
  Administered 2020-11-25: 2 mg via INTRAVITREAL

## 2020-11-26 ENCOUNTER — Other Ambulatory Visit: Payer: Self-pay

## 2020-11-26 ENCOUNTER — Telehealth: Payer: Self-pay

## 2020-11-26 ENCOUNTER — Telehealth: Payer: Self-pay | Admitting: Family Medicine

## 2020-11-26 MED ORDER — NP THYROID 90 MG PO TABS: 90.0000 mg | ORAL_TABLET | Freq: Every day | ORAL | 2 refills | Status: DC

## 2020-11-26 NOTE — Telephone Encounter (Signed)
Error

## 2020-11-26 NOTE — Telephone Encounter (Signed)
Patient advised of information below. AM 

## 2020-11-26 NOTE — Telephone Encounter (Signed)
Patient was questioning about her Thyroid medication, she stated at the last visit, she thought it was supposed to be sent in to pharmacy below and she checked and it is not there.  NP Thyroid 90MG    [id number] - [place], [place] - [address] AT [address]OSS FROM [address] Phone:  [phone]  Fax:  [phone]

## 2020-11-27 ENCOUNTER — Other Ambulatory Visit: Payer: Self-pay | Admitting: *Deleted

## 2020-11-27 ENCOUNTER — Telehealth: Payer: Self-pay | Admitting: Cardiology

## 2020-11-27 DIAGNOSIS — Z01810 Encounter for preprocedural cardiovascular examination: Secondary | ICD-10-CM | POA: Diagnosis not present

## 2020-11-27 MED ORDER — LOSARTAN POTASSIUM 50 MG PO TABS: 50.0000 mg | ORAL_TABLET | Freq: Every day | ORAL | 3 refills | Status: DC

## 2020-11-27 NOTE — Telephone Encounter (Signed)
*  STAT* If patient is at the pharmacy, call can be transferred to refill team.   1. Which medications need to be refilled? (please list name of each medication and dose if known)  Losartan  2. Which pharmacy/location (including street and city if local pharmacy) is medication to be sent to?[facility]  3. Do they need a 30 day or 90 day supply? 90 days and refills

## 2020-11-28 LAB — BASIC METABOLIC PANEL
BUN/Creatinine Ratio: 24 (ref 12–28)
BUN: 19 mg/dL (ref 8–27)
CO2: 20 mmol/L (ref 20–29)
Calcium: 8.9 mg/dL (ref 8.7–10.3)
Chloride: 103 mmol/L (ref 96–106)
Creatinine, Ser: 0.78 mg/dL (ref 0.57–1.00)
Glucose: 184 mg/dL — ABNORMAL HIGH (ref 65–99)
Potassium: 4.3 mmol/L (ref 3.5–5.2)
Sodium: 140 mmol/L (ref 134–144)
eGFR: 81 mL/min/{1.73_m2} (ref >59)

## 2020-11-30 ENCOUNTER — Encounter: Payer: Self-pay | Admitting: *Deleted

## 2020-12-01 ENCOUNTER — Observation Stay (HOSPITAL_COMMUNITY)
Admission: EM | Admit: 2020-12-01 | Discharge: 2020-12-02 | Disposition: A | Discharge status: Home / Self Care | Payer: Medicare HMO | Attending: Internal Medicine | Admitting: Internal Medicine

## 2020-12-01 ENCOUNTER — Emergency Department (HOSPITAL_COMMUNITY): Payer: Medicare HMO

## 2020-12-01 ENCOUNTER — Other Ambulatory Visit: Payer: Self-pay

## 2020-12-01 ENCOUNTER — Encounter (HOSPITAL_COMMUNITY): Payer: Self-pay | Admitting: Emergency Medicine

## 2020-12-01 DIAGNOSIS — D72829 Elevated white blood cell count, unspecified: Secondary | ICD-10-CM

## 2020-12-01 DIAGNOSIS — E119 Type 2 diabetes mellitus without complications: Secondary | ICD-10-CM | POA: Diagnosis not present

## 2020-12-01 DIAGNOSIS — Z7901 Long term (current) use of anticoagulants: Secondary | ICD-10-CM | POA: Insufficient documentation

## 2020-12-01 DIAGNOSIS — N390 Urinary tract infection, site not specified: Secondary | ICD-10-CM

## 2020-12-01 DIAGNOSIS — R112 Nausea with vomiting, unspecified: Secondary | ICD-10-CM

## 2020-12-01 DIAGNOSIS — Z87891 Personal history of nicotine dependence: Secondary | ICD-10-CM | POA: Diagnosis not present

## 2020-12-01 DIAGNOSIS — K6389 Other specified diseases of intestine: Secondary | ICD-10-CM | POA: Diagnosis not present

## 2020-12-01 DIAGNOSIS — Z7984 Long term (current) use of oral hypoglycemic drugs: Secondary | ICD-10-CM | POA: Insufficient documentation

## 2020-12-01 DIAGNOSIS — K529 Noninfective gastroenteritis and colitis, unspecified: Secondary | ICD-10-CM | POA: Diagnosis not present

## 2020-12-01 DIAGNOSIS — I10 Essential (primary) hypertension: Secondary | ICD-10-CM | POA: Diagnosis present

## 2020-12-01 DIAGNOSIS — Z20822 Contact with and (suspected) exposure to covid-19: Secondary | ICD-10-CM | POA: Diagnosis not present

## 2020-12-01 DIAGNOSIS — I739 Peripheral vascular disease, unspecified: Secondary | ICD-10-CM

## 2020-12-01 DIAGNOSIS — E1152 Type 2 diabetes mellitus with diabetic peripheral angiopathy with gangrene: Secondary | ICD-10-CM | POA: Diagnosis present

## 2020-12-01 DIAGNOSIS — R109 Unspecified abdominal pain: Secondary | ICD-10-CM | POA: Diagnosis present

## 2020-12-01 DIAGNOSIS — I480 Paroxysmal atrial fibrillation: Secondary | ICD-10-CM | POA: Diagnosis present

## 2020-12-01 DIAGNOSIS — E039 Hypothyroidism, unspecified: Secondary | ICD-10-CM | POA: Diagnosis not present

## 2020-12-01 DIAGNOSIS — Z79899 Other long term (current) drug therapy: Secondary | ICD-10-CM | POA: Diagnosis not present

## 2020-12-01 DIAGNOSIS — K436 Other and unspecified ventral hernia with obstruction, without gangrene: Principal | ICD-10-CM | POA: Diagnosis present

## 2020-12-01 DIAGNOSIS — N179 Acute kidney failure, unspecified: Secondary | ICD-10-CM | POA: Diagnosis not present

## 2020-12-01 DIAGNOSIS — K5939 Other megacolon: Secondary | ICD-10-CM | POA: Diagnosis not present

## 2020-12-01 DIAGNOSIS — K439 Ventral hernia without obstruction or gangrene: Secondary | ICD-10-CM | POA: Diagnosis not present

## 2020-12-01 LAB — CBC WITH DIFFERENTIAL/PLATELET
Abs Immature Granulocytes: 0.1 10*3/uL — ABNORMAL HIGH (ref 0.00–0.07)
Basophils Absolute: 0.1 10*3/uL (ref 0.0–0.1)
Basophils Relative: 0 %
Eosinophils Absolute: 0.2 10*3/uL (ref 0.0–0.5)
Eosinophils Relative: 1 %
HCT: 39 % (ref 36.0–46.0)
Hemoglobin: 12.8 g/dL (ref 12.0–15.0)
Immature Granulocytes: 1 %
Lymphocytes Relative: 9 %
Lymphs Abs: 1.8 10*3/uL (ref 0.7–4.0)
MCH: 28.9 pg (ref 26.0–34.0)
MCHC: 32.8 g/dL (ref 30.0–36.0)
MCV: 88 fL (ref 80.0–100.0)
Monocytes Absolute: 1.2 10*3/uL — ABNORMAL HIGH (ref 0.1–1.0)
Monocytes Relative: 6 %
Neutro Abs: 15.8 10*3/uL — ABNORMAL HIGH (ref 1.7–7.7)
Neutrophils Relative %: 83 %
Platelets: 310 10*3/uL (ref 150–400)
RBC: 4.43 MIL/uL (ref 3.87–5.11)
RDW: 13.6 % (ref 11.5–15.5)
WBC: 19.1 10*3/uL — ABNORMAL HIGH (ref 4.0–10.5)
nRBC: 0 % (ref 0.0–0.2)

## 2020-12-01 LAB — URINALYSIS, ROUTINE W REFLEX MICROSCOPIC
Bilirubin Urine: NEGATIVE
Glucose, UA: NEGATIVE mg/dL
Ketones, ur: 5 mg/dL — AB
Nitrite: NEGATIVE
Protein, ur: 100 mg/dL — AB
Specific Gravity, Urine: 1.016 (ref 1.005–1.030)
WBC, UA: >50 WBC/hpf — ABNORMAL HIGH (ref 0–5)
pH: 5 (ref 5.0–8.0)

## 2020-12-01 LAB — COMPREHENSIVE METABOLIC PANEL
ALT: 22 U/L (ref 0–44)
AST: 24 U/L (ref 15–41)
Albumin: 3.9 g/dL (ref 3.5–5.0)
Alkaline Phosphatase: 60 U/L (ref 38–126)
Anion gap: 13 (ref 5–15)
BUN: 29 mg/dL — ABNORMAL HIGH (ref 8–23)
CO2: 18 mmol/L — ABNORMAL LOW (ref 22–32)
Calcium: 9.6 mg/dL (ref 8.9–10.3)
Chloride: 103 mmol/L (ref 98–111)
Creatinine, Ser: 1.22 mg/dL — ABNORMAL HIGH (ref 0.44–1.00)
GFR, Estimated: 47 mL/min — ABNORMAL LOW (ref >60)
Glucose, Bld: 260 mg/dL — ABNORMAL HIGH (ref 70–99)
Potassium: 4.1 mmol/L (ref 3.5–5.1)
Sodium: 134 mmol/L — ABNORMAL LOW (ref 135–145)
Total Bilirubin: 0.5 mg/dL (ref 0.3–1.2)
Total Protein: 7.1 g/dL (ref 6.5–8.1)

## 2020-12-01 LAB — GLUCOSE, CAPILLARY
Glucose-Capillary: 126 mg/dL — ABNORMAL HIGH (ref 70–99)
Glucose-Capillary: 255 mg/dL — ABNORMAL HIGH (ref 70–99)

## 2020-12-01 LAB — RESP PANEL BY RT-PCR (FLU A&B, COVID) ARPGX2
Influenza A by PCR: NEGATIVE
Influenza B by PCR: NEGATIVE
SARS Coronavirus 2 by RT PCR: NEGATIVE

## 2020-12-01 LAB — LIPASE, BLOOD: Lipase: 38 U/L (ref 11–51)

## 2020-12-01 MED ORDER — ACETAMINOPHEN 325 MG PO TABS
650.0000 mg | ORAL_TABLET | Freq: Four times a day (QID) | ORAL | Status: DC | PRN
Start: 2020-12-01 — End: 2020-12-02

## 2020-12-01 MED ORDER — KETOROLAC TROMETHAMINE 30 MG/ML IJ SOLN
15.0000 mg | Freq: Once | INTRAMUSCULAR | Status: AC
  Administered 2020-12-01: 15 mg via INTRAVENOUS
  Filled 2020-12-01: qty 1

## 2020-12-01 MED ORDER — ACETAMINOPHEN 650 MG RE SUPP: 650.0000 mg | Freq: Four times a day (QID) | RECTAL | Status: DC | PRN

## 2020-12-01 MED ORDER — SODIUM CHLORIDE 0.9 % IV SOLN: INTRAVENOUS | Status: DC

## 2020-12-01 MED ORDER — HYDRALAZINE HCL 20 MG/ML IJ SOLN: 10.0000 mg | INTRAMUSCULAR | Status: DC | PRN

## 2020-12-01 MED ORDER — ONDANSETRON HCL 4 MG/2ML IJ SOLN
4.0000 mg | Freq: Once | INTRAMUSCULAR | Status: AC
  Administered 2020-12-01: 4 mg via INTRAVENOUS
  Filled 2020-12-01: qty 2

## 2020-12-01 MED ORDER — THYROID 60 MG PO TABS
90.0000 mg | ORAL_TABLET | Freq: Every day | ORAL | Status: DC
  Administered 2020-12-02: 90 mg via ORAL
  Filled 2020-12-01: qty 1

## 2020-12-01 MED ORDER — LACTATED RINGERS IV SOLN: INTRAVENOUS | Status: DC

## 2020-12-01 MED ORDER — MORPHINE SULFATE (PF) 2 MG/ML IV SOLN
2.0000 mg | INTRAVENOUS | Status: DC | PRN
Start: 2020-12-01 — End: 2020-12-02

## 2020-12-01 MED ORDER — ONDANSETRON HCL 4 MG/2ML IJ SOLN: 4.0000 mg | Freq: Four times a day (QID) | INTRAMUSCULAR | Status: DC | PRN

## 2020-12-01 MED ORDER — ONDANSETRON 4 MG PO TBDP: 4.0000 mg | ORAL_TABLET | Freq: Four times a day (QID) | ORAL | Status: DC | PRN

## 2020-12-01 MED ORDER — INSULIN ASPART 100 UNIT/ML IJ SOLN
0.0000 [IU] | Freq: Three times a day (TID) | INTRAMUSCULAR | Status: DC
  Administered 2020-12-01 – 2020-12-02 (×2): 2 [IU] via SUBCUTANEOUS

## 2020-12-01 MED ORDER — SODIUM CHLORIDE 0.9 % IV SOLN
1.0000 g | Freq: Once | INTRAVENOUS | Status: AC
  Administered 2020-12-01: 1 g via INTRAVENOUS
  Filled 2020-12-01: qty 10

## 2020-12-01 MED ORDER — METOPROLOL TARTRATE 25 MG PO TABS
25.0000 mg | ORAL_TABLET | Freq: Two times a day (BID) | ORAL | Status: DC
  Administered 2020-12-02: 25 mg via ORAL
  Filled 2020-12-01: qty 1

## 2020-12-01 MED ORDER — ROSUVASTATIN CALCIUM 20 MG PO TABS
40.0000 mg | ORAL_TABLET | Freq: Every day | ORAL | Status: DC
  Administered 2020-12-01: 40 mg via ORAL
  Filled 2020-12-01: qty 2

## 2020-12-01 NOTE — Plan of Care (Signed)

## 2020-12-01 NOTE — Plan of Care (Signed)
  Problem: Education: Goal: Knowledge of General Education information will improve Description Including pain rating scale, medication(s)/side effects and non-pharmacologic comfort measures Outcome: Progressing   Problem: Clinical Measurements: Goal: Ability to maintain clinical measurements within normal limits will improve Outcome: Progressing   Problem: Activity: Goal: Risk for activity intolerance will decrease Outcome: Progressing   Problem: Elimination: Goal: Will not experience complications related to bowel motility Outcome: Progressing   Problem: Safety: Goal: Ability to remain free from injury will improve Outcome: Progressing   Problem: Skin Integrity: Goal: Risk for impaired skin integrity will decrease Outcome: Progressing   

## 2020-12-01 NOTE — Progress Notes (Signed)
Progress Note     Subjective: 75 year old female known to the [facility] service with history of ventral incisional hernia that failed repair with mesh in 1990.  She was recently in the hospital for inability to reduce hernia with abdominal pain and diarrhea.  Hernia was reduced at that time, she progressed well, and was discharged home with follow-up with Dr. Gerrit Friends planned on 12/17/2020.  She states last night she had acute onset of pain at 7 PM last night and inability to reduce hernia.  She had just had a large meal with family which is abnormal for her.  She has had nausea but no emesis.  Last BM last night at 9 PM which was normal.  Upon arrival to the ED she says the nurse was able to reduce her hernia prior to CT.  CT scan in ED shows fat-containing ventral hernia. She denies fever, chills.  Since last admission she has seen cardiology and she says she has been cleared.   Objective: Vital signs in last 24 hours: Temp:  [97.4 F (36.3 C)-98.3 F (36.8 C)] 98 F (36.7 C) (06/28 1056) Pulse Rate:  [69-89] 79 (06/28 1056) Resp:  [11-18] 17 (06/28 1056) BP: (116-170)/(75-92) 152/76 (06/28 1056) SpO2:  [99 %-100 %] 100 % (06/28 1056) Weight:  [69.2 kg-75 kg] 69.2 kg (06/28 1056)    Intake/Output from previous day: No intake/output data recorded. Intake/Output this shift: Total I/O In: 361.4 [I.V.:261.4; IV Piggyback:100] Out: -   PE: General: pleasant, WD, female who is laying in bed in NAD HEENT: head is normocephalic, atraumatic.  Ears and nose without any masses or lesions.  Mouth is pink and moist Heart: regular, rate, and rhythm. Palpable radial and pedal pulses bilaterally Lungs: CTAB, no wheezes, rhonchi, or rales noted.  Respiratory effort nonlabored Abd: soft, NT, ND, +BS, well healed midline surgical scar. Hernia not grossly visible on exam and remains reduced on palpation MS: all 4 extremities are symmetrical with no cyanosis, clubbing, or edema. Skin: warm and dry with  no masses, lesions, or rashes Psych: A&Ox3 with an appropriate affect.    Lab Results:  Recent Labs    12/01/20 0118  WBC 19.1*  HGB 12.8  HCT 39.0  PLT 310   BMET Recent Labs    12/01/20 0118  NA 134*  K 4.1  CL 103  CO2 18*  GLUCOSE 260*  BUN 29*  CREATININE 1.22*  CALCIUM 9.6   PT/INR No results for input(s): LABPROT, INR in the last 72 hours. CMP     Component Value Date/Time   NA 134 (L) 12/01/2020 0118   NA 140 11/27/2020 1002   K 4.1 12/01/2020 0118   CL 103 12/01/2020 0118   CO2 18 (L) 12/01/2020 0118   GLUCOSE 260 (H) 12/01/2020 0118   BUN 29 (H) 12/01/2020 0118   BUN 19 11/27/2020 1002   CREATININE 1.22 (H) 12/01/2020 0118   CREATININE 0.84 11/20/2020 1208   CALCIUM 9.6 12/01/2020 0118   PROT 7.1 12/01/2020 0118   ALBUMIN 3.9 12/01/2020 0118   AST 24 12/01/2020 0118   ALT 22 12/01/2020 0118   ALKPHOS 60 12/01/2020 0118   BILITOT 0.5 12/01/2020 0118   GFRNONAA 47 (L) 12/01/2020 0118   GFRNONAA 74 10/08/2020 0000   GFRAA 85 10/08/2020 0000   Lipase     Component Value Date/Time   LIPASE 38 12/01/2020 0118       Studies/Results: CT ABDOMEN PELVIS WO CONTRAST  Result Date: 12/01/2020 CLINICAL DATA:  Mid abdominal pain, nausea, vomiting EXAM: CT ABDOMEN AND PELVIS WITHOUT CONTRAST TECHNIQUE: Multidetector CT imaging of the abdomen and pelvis was performed following the standard protocol without IV contrast. COMPARISON:  None. FINDINGS: Lower chest: The visualized lung bases are clear bilaterally. The visualized heart and pericardium are unremarkable. Hepatobiliary: No focal liver abnormality is seen. Status post cholecystectomy. No biliary dilatation. 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 Patency not well assessed on this noncontrast examination. The spleen is otherwise unremarkable. Adrenals/Urinary Tract: Adrenal glands are unremarkable. Kidneys are normal, without renal calculi, focal lesion,  or hydronephrosis. Bladder is unremarkable. Stomach/Bowel: The stomach is unremarkable. The small bowel is diffusely mildly dilated and fluid-filled without a focal point of transition identified. The colon, additionally, appears diffusely fluid-filled. The findings are nonspecific, but together, may be seen in the setting of enterocolitis. No evidence of obstruction or focal inflammation. No free intraperitoneal gas or fluid. Vascular/Lymphatic: There is severe aortoiliac atherosclerotic calcification. No aortic aneurysm. Moderate atherosclerotic calcification is seen within the proximal superior mesenteric artery. No pathologic adenopathy within the abdomen and pelvis. Reproductive: The endometrium is thickened and there is a tiny focus of gas side can identified within the endometrial cavity. Stable 2.9 cm right adnexal cystic lesion. Other: Moderate fat containing infraumbilical ventral hernia is again identified, unchanged. Musculoskeletal: No acute bone abnormality. No lytic or blastic bone lesion identified. IMPRESSION: Diffusely mildly dilated fluid-filled small bowel as well as fluid-filled large bowel. The findings, while nonspecific, can be seen the setting of gastroenteritis. Clinical correlation would be helpful. Peripheral vascular disease with particularly prominent atherosclerotic calcification within the superior mesenteric artery. If there is clinical evidence of chronic mesenteric ischemia, CT arteriography would be more helpful to assess the degree of vascular compromise. Stable moderate fat containing ventral hernia. Thickening of the endometrium, not well assessed on this examination. Dedicated pelvic sonography is recommended for further evaluation once the patient's acute issues have resolved. Aortic Atherosclerosis (ICD10-I70.0). Electronically Signed   By: Helyn Numbers MD   On: 12/01/2020 01:53    Anti-infectives: Anti-infectives (From admission, onward)    Start     Dose/Rate Route  Frequency Ordered Stop   12/01/20 0545  cefTRIAXone (ROCEPHIN) 1 g in sodium chloride 0.9 % 100 mL IVPB        1 g 200 mL/hr over 30 Minutes Intravenous  Once 12/01/20 0539 12/01/20 4268        Assessment/Plan Incarcerated ventral hernia - sounds like hernia mechanically reduced by RN on arrival to ED. Remains reduced at time of my exam and she is asymptomatic - agree with medicine admit for observation given WBC and dehydration - at this time recommend follow up as scheduled with Dr. Gerrit Friends for planned repair  FEN: okay for soft diet from our perspective - I have ordered ID: [email or web address] in ED VTE: can resume plavix   We will sign off but please do not hesitate to reach out as needed    LOS: 0 days    Eric Form, [facility] 12/01/2020, 2:24 PM Please see Amion for pager number during day hours 7:00am-4:30pm

## 2020-12-01 NOTE — ED Provider Notes (Signed)
Emergency Medicine Provider Triage Evaluation Note  Jacquelin Krajewski , a 75 y.o. female  was evaluated in triage.  Pt complains of central abd pain.  Pt with hx of ventral hernia.  Pt with hx of c-section, appendectomy, hernia repair, cholecystectomy.  Pt with nausea and vomiting.  Reports severe pain.    Patient does take Plavix.  Review of Systems  Positive: Abd pain, N/V Negative: fever  Physical Exam  BP 116/75 (BP Location: Left Arm)   Pulse 89   Temp (!) 97.5 F (36.4 C)   Resp 14   Ht 5\' 4"  (1.626 m)   Wt 75 kg   SpO2 100%   BMI 28.38 kg/m   Gen:   Awake, uncomfortable Resp:  Normal effort MSK:   Moves extremities without difficulty  Other:  RLQ palpable hernia; partially reduced in triage with improvement in pain  Medical Decision Making  Medically screening exam initiated at 1:18 AM.  Appropriate orders placed.  Haely Leyland was informed that the remainder of the evaluation will be completed by another provider, this initial triage assessment does not replace that evaluation, and the importance of remaining in the ED until their evaluation is complete.  Ventral hernia   Prudence Heiny, Thelma Barge 12/01/20 0127    12/03/20, MD 12/01/20 [phone]

## 2020-12-01 NOTE — H&P (Signed)
History and Physical    Erin Mitchell CRF:[id number] DOB: 1945/10/07 DOA: 12/01/2020  PCP: Everrett Coombe, DO Consultants:  Jens Som - cardiology Patient coming from:  Home - lives with husband; NOK: Husband, [phone]  Chief Complaint: Abdominal pain  HPI: Erin Mitchell is a 75 y.o. female with medical history significant of afib; DM; HTN; HLD; hypothyroidism; and recent admission for incarcerated hernia (11/03/20) presenting with abdominal pain.  She reports that she had a ventral hernia due for repair next month and it got hard, very tight, and she started having severe abdominal pain.  She was here with the same issue a month ago.  The pain started about 7pm yesterday but it was different from prior - previously she had diarrhea and the hernia "was a side product".  This time there was no diarrhea but there was significant pain.  +nausea, dry heaves.  Her last BM was about 9pm and was normal.  No fever.  No sick contacts.    ED Course: Carryover, per Dr. Loney Loh:  Patient with history of paroxysmal A. fib, diabetes, hypertension, hyperlipidemia, recently admitted for incarcerated ventral incisional hernia with SBO.  Presenting with complaints of abdominal pain, nausea, and vomiting without diarrhea.  WBC 19.1.  UA with signs of infection.  Has mild AKI.  CT showing findings consistent with gastroenteritis.  Patient was given ceftriaxone, IV fluids, antiemetic, and pain medication.   Review of Systems: As per HPI; otherwise review of systems reviewed and negative.   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    Social History   Socioeconomic History   Marital status: Married    Spouse name: Not on file   Number of children: 7   Years of education: Not on file   Highest education level: Not on file  Occupational History   Occupation: Retired  Tobacco Use   Smoking status: Former    Packs/day: 0.25    Years: 1.00    Pack years: 0.25    Types: Cigarettes    Quit date: 06/06/1970    Years since quitting: 50.5   Smokeless tobacco: Never  Vaping Use   Vaping Use: Never used  Substance and Sexual Activity   Alcohol use: Never   Drug use: Never   Sexual activity: Not on file  Other Topics Concern   Not on file  Social History Narrative   Not on file   Social Determinants of Health   Financial Resource Strain: Not on file  Food Insecurity: Not on file  Transportation Needs: Not on file  Physical Activity: Not on file  Stress: Not on file  Social Connections: Not on file  Intimate Partner Violence: Not on file    No Known Allergies  Family History  Problem Relation Age of Onset   Hypertension Father    Diabetes Brother    Emphysema Mother     Prior to Admission medications  Medication Sig Start Date End Date Taking? Authorizing Provider  Alcohol Swabs (B-D SINGLE USE SWABS REGULAR) PADS Use as directed. 10/12/20  Yes Luetta Nutting, DO  Blood Glucose Monitoring Suppl (TRUE METRIX METER) w/Device KIT Use as directed to test blood glucose daily 10/12/20  Yes Luetta Nutting, DO  clopidogrel (PLAVIX) 75 MG tablet TAKE 1 TABLET BY MOUTH ONCE DAILY Patient taking differently: Take 75 mg by mouth in the morning. 10/12/20  Yes Luetta Nutting, DO  glucose blood (TRUE METRIX BLOOD GLUCOSE TEST) test strip Use as instructed Patient taking differently: Every morning 10/12/20  Yes Luetta Nutting, DO  loperamide (IMODIUM A-D) 2 MG tablet Take 2 mg by mouth 4 (four) times daily as needed for diarrhea or loose stools.    Yes [provider]  metFORMIN (GLUCOPHAGE) 500 MG tablet Take 1 tablet (500 mg total) by mouth 2 (two) times daily with a meal. Patient taking differently: Take 1,000 mg by mouth 2 (two) times daily with a meal. 10/16/20 04/14/21 Yes Luetta Nutting, DO  metoprolol tartrate (LOPRESSOR) 25 MG tablet TAKE 1 TABLET BY MOUTH TWICE DAILY WITH FOOD Patient taking differently: Take 25 mg by mouth 2 (two) times daily with a meal. 10/12/20  Yes Luetta Nutting, DO  NP THYROID 90 MG tablet Take 1 tablet (90 mg total) by mouth daily before breakfast. 11/26/20 02/24/21 Yes Luetta Nutting, DO  PAPAYA ENZYME PO Take 2 capsules by mouth daily as needed (heartburn).   Yes [provider]  rosuvastatin (CRESTOR) 40 MG tablet TAKE 1 TABLET BY MOUTH ONCE DAILY Patient taking differently: Take 40 mg by mouth at bedtime. 11/20/20  Yes Lelon Perla, MD  saccharomyces boulardii (FLORASTOR) 250 MG capsule Take 1 capsule (250 mg total) by mouth 2 (two) times daily. 11/09/20  Yes Hongalgi, Lenis Dickinson, MD  TRUEplus Lancets 33G MISC Use as directed daily. 10/12/20  Yes Luetta Nutting, DO  losartan (COZAAR) 50 MG tablet Take 1 tablet (50 mg total) by mouth daily. 11/27/20 02/25/21  Lelon Perla, MD    Physical Exam: Vitals:   12/01/20 0716 12/01/20 0717 12/01/20 1030 12/01/20 1056  BP:  (!) 154/92 (!) 170/84 (!) 152/76  Pulse:  82 72 79  Resp:  $Remo'18 15 17  'sRGca$ Temp:  98.3 F (36.8 C)  98 F (36.7 C)  TempSrc:  Oral    SpO2: 100% 100% 100% 100%  Weight:    69.2 kg  Height:    '5\' 4"'$  (1.626 m)     General:  Appears calm and comfortable and is in NAD Eyes:  PERRL, EOMI, normal lids, iris ENT:  grossly normal hearing, lips & tongue, mmm; artificial dentition Neck:  no LAD, masses or thyromegaly Cardiovascular:  RRR, no m/r/g. No LE edema.  Respiratory:   CTA bilaterally with no wheezes/rales/rhonchi.  Normal respiratory effort. 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 COMPARISON:  None. FINDINGS: Lower chest: The visualized lung bases are clear bilaterally. The visualized heart and pericardium are unremarkable. Hepatobiliary: No focal liver abnormality is seen. Status post cholecystectomy. No biliary dilatation. 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 Patency not well assessed on this noncontrast examination. The spleen is otherwise unremarkable. Adrenals/Urinary Tract: Adrenal glands are unremarkable. Kidneys are normal, without renal calculi, focal lesion, or hydronephrosis. Bladder is unremarkable. Stomach/Bowel: The stomach is unremarkable. The small bowel is diffusely mildly dilated and fluid-filled without a focal point of transition identified. The colon, additionally, appears diffusely fluid-filled. The findings are nonspecific, but together, may be seen in the setting of enterocolitis. No evidence of obstruction or focal inflammation. No free intraperitoneal gas or fluid. Vascular/Lymphatic: There is severe aortoiliac atherosclerotic calcification. No aortic aneurysm. Moderate atherosclerotic calcification is seen within the proximal superior mesenteric artery. No pathologic adenopathy within the abdomen and  pelvis. Reproductive: The endometrium is thickened and there is a tiny focus of gas side can identified within the endometrial cavity. Stable 2.9 cm right adnexal cystic lesion. Other: Moderate fat containing infraumbilical ventral hernia is again identified, unchanged. Musculoskeletal: No acute bone abnormality. No lytic or blastic bone lesion identified. IMPRESSION: Diffusely mildly dilated fluid-filled small bowel as well as fluid-filled large bowel. The findings, while nonspecific, can be seen the setting of gastroenteritis. Clinical correlation would be helpful. Peripheral vascular disease with particularly prominent atherosclerotic calcification within the superior mesenteric artery. If there is clinical evidence of chronic mesenteric ischemia, CT arteriography would be more helpful to assess the degree of vascular compromise. Stable moderate fat containing ventral hernia. Thickening of the endometrium, not well assessed on this examination. Dedicated pelvic sonography is recommended for further evaluation once the patient's acute issues have resolved. Aortic Atherosclerosis (ICD10-I70.0). Electronically Signed   By: Fidela Salisbury MD   On: 12/01/2020 01:53    EKG: Independently reviewed.  NSR/Bigeminy with rate 88; nonspecific ST changes with no evidence of acute ischemia   Labs on Admission: I have personally reviewed the available labs and imaging studies at the time of the admission.  Pertinent labs:   CO2 18 Glucose 260 BUN 29/Creatinine 1.22/GFR 47; 19/0.78/81 on 6/24 WBC 19.1 UA: small Hgb, 5 ketones, large LE, 100 protein, few bacteria, >50 WBC   Assessment/Plan Principal Problem:   Incarcerated ventral hernia Active Problems:   Type 2 diabetes mellitus with diabetic peripheral angiopathy and gangrene, without long-term current use of insulin (HCC)   PAF (paroxysmal atrial fibrillation) (HCC)   Acquired hypothyroidism   AKI (acute kidney injury) ([place])   Hypertension   Ventral  hernia, possibly incarcerated -Patient previously hospitalized in early June for incarcerated hernia with SGO -She was planned for outpatient laparoscopic hernia repair -She returned overnight with severe abdominal pain and nausea that occurred with the pain -Her symptoms have since resolved -She had a normal BM last night -Suspect that this was another episode of hernia incarceration that resolved spontaneously -CT with nonspecific findings, ?gastroenteritis - but clinical picture does not fit this scenario -Will hold Plavix for now in case surgical intervention is indicated -Surgery has been consulted -NPO for now  AKI -Appears to be mildly dehydrated -Diagnosed with UTI in the ER and given antibiotics, but patient is without symptoms and her UA is more c/w dehydration -Hold Cozaar -Will hydrate and follow BMP (without further antibiotics)  Afib -Rate controlled with Lopressor -Not  on [facility] because this was a single peri-operative event  HTN -Continue Lopressor -Hold Cozaar  HLD -Continue Crestor  DM -Recent A1c was 6.8, indicating good control -hold Glucophage -Cover with moderate-scale SSI   Hypothyroidism -Check TSH -Continue Armour thyroid at current dose for now      Note: This patient has been tested and is negative for the novel coronavirus COVID-19. The patient has been fully vaccinated against COVID-19.   Level of care: Med-Surg DVT prophylaxis:  SCDs Code Status:  DNR - confirmed with patient Family Communication: None present Disposition Plan:  The patient is from: home  Anticipated d/c is to: home without [facility] services   Anticipated d/c date will depend on clinical response to treatment, but possibly as early as tomorrow if she has excellent response to treatment  Patient is currently: acutely ill Consults called: Surgery  Admission status:  It is my clinical opinion that referral for OBSERVATION is reasonable and necessary in this patient based on the  above information provided. The aforementioned taken together are felt to place the patient at high risk for further clinical deterioration. However it is anticipated that the patient may be medically stable for discharge from the hospital within 24 to 48 hours.    Karmen Bongo MD [facility]   How to contact the [facility] Attending or Consulting provider [place] or covering provider during after hours [place], for this patient?  Check the care team in [facility] and look for a) attending/consulting [facility] provider listed and b) the [facility] team listed Log into [email or web address] and use [place]'s universal password to access. If you do not have the password, please contact the hospital operator. Locate the [facility] provider you are looking for under [facility] and page to a number that you can be directly reached. If you still have difficulty reaching the provider, please page the [facility] (Director on Call) for the Hospitalists listed on amion for assistance.   12/01/2020, 1:32 PM

## 2020-12-01 NOTE — ED Provider Notes (Signed)
[facility] EMERGENCY DEPARTMENT Provider Note   CSN: [id number] Arrival date & time: 12/01/20  0040     History Chief Complaint  Patient presents with   Abdominal Pain    Erin Mitchell is a 75 y.o. female.  The history is provided by the patient.  Abdominal Pain Pain location: central. Pain quality: dull   Pain radiates to:  Does not radiate Pain severity:  Severe Onset quality:  Gradual Duration:  1 day Timing:  Constant Progression:  Unchanged Chronicity:  New Context: not trauma   Context comment:  Had 6 days of diarrhea 10 days ago but today had nausea and emesis Relieved by:  Nothing Worsened by:  Nothing Ineffective treatments:  None tried Associated symptoms: diarrhea, nausea and vomiting   Associated symptoms: no fever   Risk factors: being elderly       Past Medical History:  Diagnosis Date   Atrial fibrillation (HCC)    Cataract    Diabetes ([place])    Diabetic retinopathy ([place])    Exposure to Agent Orange    Hypertension    Kidney disease    Low thyroid stimulating hormone (TSH) level    Thyroid disease     Patient Active Problem List   Diagnosis Date Noted   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   Nausea 11/04/2020   Abdominal pain 11/04/2020   AKI (acute kidney injury) ([place]) 11/04/2020   Leukocytosis 11/04/2020   Acute cystitis 11/04/2020   Hypertension    Diabetes ([place])    SBO (small bowel obstruction) ([place]) 11/03/2020   Type 2 diabetes mellitus with diabetic peripheral angiopathy and gangrene, without long-term current use of insulin ([place]) 10/08/2020   PAF (paroxysmal atrial fibrillation) ([place]) 10/08/2020   Acquired hypothyroidism 10/08/2020   PAD (peripheral artery disease) ([place]) 10/08/2020   Urinary frequency 10/08/2020   History of amputation of toe ([place]) 10/08/2020   Agent orange exposure 10/08/2020    Past Surgical History:  Procedure Laterality Date   APPENDECTOMY     CESAREAN SECTION      CHOLECYSTECTOMY     HERNIA REPAIR     TOE AMPUTATION Right    2nd & 3rd     OB History   No obstetric history on file.     Family History  Problem Relation Age of Onset   Hypertension Father    Diabetes Brother    Emphysema Mother     Social History   Tobacco Use   Smoking status: Former    Packs/day: 0.25    Years: 1.00    Pack years: 0.25    Types: Cigarettes    Quit date: 06/06/1970    Years since quitting: 50.5   Smokeless tobacco: Never  Vaping Use   Vaping Use: Never used  Substance Use Topics   Alcohol use: Never   Drug use: Never    Home Medications Prior to Admission medications   Medication Sig Start Date End Date Taking? Authorizing Provider  Alcohol Swabs (B-D SINGLE USE SWABS REGULAR) PADS Use as directed. 10/12/20   Luetta Nutting, DO  Blood Glucose Monitoring Suppl (TRUE METRIX METER) w/Device KIT Use as directed to test blood glucose daily 10/12/20   Luetta Nutting, DO  clopidogrel (PLAVIX) 75 MG tablet TAKE 1 TABLET BY MOUTH ONCE DAILY Patient taking differently: Take 75 mg by mouth in the morning. 10/12/20   Luetta Nutting, DO  glucose blood (TRUE METRIX BLOOD GLUCOSE TEST) test strip Use as instructed 10/12/20   Luetta Nutting, DO  loperamide (IMODIUM A-D) 2 MG tablet Take 2 mg by mouth 4 (four) times daily as needed for diarrhea or loose stools.    [provider]  losartan (COZAAR) 50 MG tablet Take 1 tablet (50 mg total) by mouth daily. 11/27/20 02/25/21  Lelon Perla, MD  metFORMIN (GLUCOPHAGE) 500 MG tablet Take 1 tablet (500 mg total) by mouth 2 (two) times daily with a meal. Patient taking differently: Take 1,000 mg by mouth 2 (two) times daily with a meal. 10/16/20 04/14/21  Luetta Nutting, DO  metoprolol tartrate (LOPRESSOR) 25 MG tablet TAKE 1 TABLET BY MOUTH TWICE DAILY WITH FOOD Patient taking differently: Take 25 mg by mouth 2 (two) times daily with a meal. 10/12/20   Luetta Nutting, DO  NP THYROID 90 MG tablet Take 1 tablet (90 mg  total) by mouth daily before breakfast. 11/26/20 02/24/21  Luetta Nutting, DO  rosuvastatin (CRESTOR) 40 MG tablet TAKE 1 TABLET BY MOUTH ONCE DAILY 11/20/20   Lelon Perla, MD  saccharomyces boulardii (FLORASTOR) 250 MG capsule Take 1 capsule (250 mg total) by mouth 2 (two) times daily. 11/09/20   Hongalgi, Lenis Dickinson, MD  TRUEplus Lancets 33G MISC Use as directed daily. 10/12/20   Luetta Nutting, DO    Allergies    Patient has no known allergies.  Review of Systems   Review of Systems  Constitutional:  Negative for fever.  HENT:  Negative for drooling.   Eyes:  Negative for redness.  Respiratory:  Negative for wheezing.   Cardiovascular:  Negative for leg swelling.  Gastrointestinal:  Positive for abdominal pain, diarrhea, nausea and vomiting.  Genitourinary:  Negative for flank pain.  Musculoskeletal:  Negative for neck stiffness.  Skin:  Negative for rash.  Neurological:  Negative for facial asymmetry.  Psychiatric/Behavioral:  Negative for agitation.   All other systems reviewed and are negative.  Physical Exam Updated Vital Signs BP 134/80 (BP Location: Right Arm)   Pulse 83   Temp (!) 97.5 F (36.4 C)   Resp 14   Ht _0  (1.626 m)   Wt 75 kg   SpO2 99%   BMI 28.38 kg/m   Physical Exam Vitals and nursing note reviewed.  Constitutional:      General: She is not in acute distress.    Appearance: Normal appearance.  HENT:     Head: Normocephalic and atraumatic.     Nose: Nose normal.  Eyes:     Extraocular Movements: Extraocular movements intact.     Conjunctiva/sclera: Conjunctivae normal.  Cardiovascular:     Rate and Rhythm: Normal rate and regular rhythm.     Pulses: Normal pulses.     Heart sounds: Normal heart sounds.  Pulmonary:     Effort: Pulmonary effort is normal.     Breath sounds: Normal breath sounds.  Abdominal:     General: Abdomen is flat. Bowel sounds are decreased.     Palpations: Abdomen is soft.     Tenderness: There is abdominal  tenderness in the periumbilical area. There is no guarding or rebound. Negative signs include Murphy's sign and McBurney's sign.  Musculoskeletal:        General: Normal range of motion.     Cervical back: Normal range of motion and neck supple.  Skin:    General: Skin is warm and dry.     Capillary Refill: Capillary refill takes less than 2 seconds.  Neurological:     General: No focal deficit present.  Mental Status: She is alert and oriented to person, place, and time.     Deep Tendon Reflexes: Reflexes normal.  Psychiatric:        Mood and Affect: Mood normal.        Behavior: Behavior normal.    ED Results / Procedures / Treatments   Labs (all labs ordered are listed, but only abnormal results are displayed) Results for orders placed or performed during the hospital encounter of 12/01/20  CBC with Differential/Platelet  Result Value Ref Range   WBC 19.1 (H) 4.0 - 10.5 K/uL   RBC 4.43 3.87 - 5.11 MIL/uL   Hemoglobin 12.8 12.0 - 15.0 g/dL   HCT 39.0 36.0 - 46.0 %   MCV 88.0 80.0 - 100.0 fL   MCH 28.9 26.0 - 34.0 pg   MCHC 32.8 30.0 - 36.0 g/dL   RDW 13.6 11.5 - 15.5 %   Platelets 310 150 - 400 K/uL   nRBC 0.0 0.0 - 0.2 %   Neutrophils Relative % 83 %   Neutro Abs 15.8 (H) 1.7 - 7.7 K/uL   Lymphocytes Relative 9 %   Lymphs Abs 1.8 0.7 - 4.0 K/uL   Monocytes Relative 6 %   Monocytes Absolute 1.2 (H) 0.1 - 1.0 K/uL   Eosinophils Relative 1 %   Eosinophils Absolute 0.2 0.0 - 0.5 K/uL   Basophils Relative 0 %   Basophils Absolute 0.1 0.0 - 0.1 K/uL   Immature Granulocytes 1 %   Abs Immature Granulocytes 0.10 (H) 0.00 - 0.07 K/uL  Comprehensive metabolic panel  Result Value Ref Range   Sodium 134 (L) 135 - 145 mmol/L   Potassium 4.1 3.5 - 5.1 mmol/L   Chloride 103 98 - 111 mmol/L   CO2 18 (L) 22 - 32 mmol/L   Glucose, Bld 260 (H) 70 - 99 mg/dL   BUN 29 (H) 8 - 23 mg/dL   Creatinine, Ser 1.22 (H) 0.44 - 1.00 mg/dL   Calcium 9.6 8.9 - 10.3 mg/dL   Total Protein 7.1  6.5 - 8.1 g/dL   Albumin 3.9 3.5 - 5.0 g/dL   AST 24 15 - 41 U/L   ALT 22 0 - 44 U/L   Alkaline Phosphatase 60 38 - 126 U/L   Total Bilirubin 0.5 0.3 - 1.2 mg/dL   GFR, Estimated 47 (L) >60 mL/min   Anion gap 13 5 - 15  Lipase, blood  Result Value Ref Range   Lipase 38 11 - 51 U/L  Urinalysis, Routine w reflex microscopic Urine, Clean Catch  Result Value Ref Range   Color, Urine AMBER (A) YELLOW   APPearance CLOUDY (A) CLEAR   Specific Gravity, Urine 1.016 1.005 - 1.030   pH 5.0 5.0 - 8.0   Glucose, UA NEGATIVE NEGATIVE mg/dL   Hgb urine dipstick SMALL (A) NEGATIVE   Bilirubin Urine NEGATIVE NEGATIVE   Ketones, ur 5 (A) NEGATIVE mg/dL   Protein, ur 100 (A) NEGATIVE mg/dL   Nitrite NEGATIVE NEGATIVE   Leukocytes,Ua LARGE (A) NEGATIVE   RBC / HPF 6-10 0 - 5 RBC/hpf   WBC, UA >50 (H) 0 - 5 WBC/hpf   Bacteria, UA FEW (A) NONE SEEN   Squamous Epithelial / LPF 0-5 0 - 5   WBC Clumps PRESENT    Mucus PRESENT    Hyaline Casts, UA PRESENT    Non Squamous Epithelial 0-5 (A) NONE SEEN   CT ABDOMEN PELVIS WO CONTRAST  Result Date: 12/01/2020 CLINICAL DATA:  Mid  abdominal pain, nausea, vomiting EXAM: CT ABDOMEN AND PELVIS WITHOUT CONTRAST TECHNIQUE: Multidetector CT imaging of the abdomen and pelvis was performed following the standard protocol without IV contrast. COMPARISON:  None. FINDINGS: Lower chest: The visualized lung bases are clear bilaterally. The visualized heart and pericardium are unremarkable. Hepatobiliary: No focal liver abnormality is seen. Status post cholecystectomy. No biliary dilatation. 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 Patency not well assessed on this noncontrast examination. The spleen is otherwise unremarkable. Adrenals/Urinary Tract: Adrenal glands are unremarkable. Kidneys are normal, without renal calculi, focal lesion, or hydronephrosis. Bladder is unremarkable. Stomach/Bowel: The stomach is unremarkable.  The small bowel is diffusely mildly dilated and fluid-filled without a focal point of transition identified. The colon, additionally, appears diffusely fluid-filled. The findings are nonspecific, but together, may be seen in the setting of enterocolitis. No evidence of obstruction or focal inflammation. No free intraperitoneal gas or fluid. Vascular/Lymphatic: There is severe aortoiliac atherosclerotic calcification. No aortic aneurysm. Moderate atherosclerotic calcification is seen within the proximal superior mesenteric artery. No pathologic adenopathy within the abdomen and pelvis. Reproductive: The endometrium is thickened and there is a tiny focus of gas side can identified within the endometrial cavity. Stable 2.9 cm right adnexal cystic lesion. Other: Moderate fat containing infraumbilical ventral hernia is again identified, unchanged. Musculoskeletal: No acute bone abnormality. No lytic or blastic bone lesion identified. IMPRESSION: Diffusely mildly dilated fluid-filled small bowel as well as fluid-filled large bowel. The findings, while nonspecific, can be seen the setting of gastroenteritis. Clinical correlation would be helpful. Peripheral vascular disease with particularly prominent atherosclerotic calcification within the superior mesenteric artery. If there is clinical evidence of chronic mesenteric ischemia, CT arteriography would be more helpful to assess the degree of vascular compromise. Stable moderate fat containing ventral hernia. Thickening of the endometrium, not well assessed on this examination. Dedicated pelvic sonography is recommended for further evaluation once the patient's acute issues have resolved. Aortic Atherosclerosis (ICD10-I70.0). Electronically Signed   By: Fidela Salisbury MD   On: 12/01/2020 01:53   CT ABDOMEN PELVIS WO CONTRAST  Result Date: 11/06/2020 CLINICAL DATA:  Abdominal pain.  Hernia suspected. EXAM: CT ABDOMEN AND PELVIS WITHOUT CONTRAST TECHNIQUE: Multidetector CT  imaging of the abdomen and pelvis was performed following the standard protocol without IV contrast. COMPARISON:  11/03/2020 FINDINGS: Lower chest: Lung bases are clear. Hepatobiliary: New small amount of perihepatic ascites. Gallbladder has been removed. Pancreas: Unremarkable. No pancreatic ductal dilatation or surrounding inflammatory changes. Spleen: New perisplenic ascites. 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 No splenic enlargement. Adrenals/Urinary Tract: Normal appearance of the adrenal glands. No kidney stones or hydronephrosis. Small amount of fluid in the urinary bladder. Stomach/Bowel: Colon is decompressed. Evidence for diverticular changes in the sigmoid colon and limited evaluation of the sigmoid colon on sequence 3, image 62. There is oral contrast in the right colon. Normal appearance of the stomach without distension. Possible duodenal diverticulum near the second portion of the duodenum. Dilated loops of small bowel in the abdomen measuring up to 2.7 cm. Extensive wall thickening involving loops of distal small bowel in the ileum. Small bowel wall thickening measures up to 1.0 cm. Oral contrast within the distal small bowel. Mesenteric edema. Vascular/Lymphatic: Atherosclerotic calcifications in the aorta and iliac arteries without aneurysm. No lymph node enlargement in the abdomen or pelvis. Reproductive: Small focus of gas at the uterine  fundus is similar to the recent comparison examination. Small amount of fluid around the uterine fundus. Again noted is a low-density structure in the right adnexa that measures roughly 3.3 cm. Other: Again noted is a periumbilical ventral hernia. There is no longer bowel within this ventral hernia. The ventral hernia now contains fat with mild stranding. Small amount of free fluid in the lower abdomen and pelvis. Musculoskeletal: No acute bone  abnormality. IMPRESSION: 1. Severe bowel wall thickening involving the distal small bowel including the terminal ileum. This represents a significant change from the exam on 11/03/2020. Findings are suggestive for enteritis. New ascites and mesenteric stranding associated with the bowel inflammation. 2. Ventral hernia no longer contains small bowel. 3. Mild distention of small bowel but there is oral contrast in the right colon. 4. Persistent unusual findings involving the uterus and right adnexa in a patient of this age. Again noted is a small focus of gas within the uterus and low-density right adnexal/ovarian structure. These areas may be better characterized with ultrasound. 5. 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 Recommend annual cross-sectional surveillance. These results will be called to the ordering clinician or representative by the Radiologist Assistant, and communication documented in the PACS or [employer]. Electronically Signed   By: Markus Daft M.D.   On: 11/06/2020 17:49   CT ABDOMEN PELVIS WO CONTRAST  Result Date: 11/03/2020 CLINICAL DATA:  Left upper quadrant abdominal pain and diarrhea for 9 days. Episode of bloody stools. Weight loss of 8-9 pounds. Prior appendectomy and cholecystectomy. History of diverticulitis. EXAM: CT ABDOMEN AND PELVIS WITHOUT CONTRAST TECHNIQUE: Multidetector CT imaging of the abdomen and pelvis was performed following the standard protocol without IV contrast. COMPARISON:  None. FINDINGS: Lower chest: No significant pulmonary nodules or acute consolidative airspace disease. Hepatobiliary: Normal liver size. No liver mass. Cholecystectomy. No biliary ductal dilatation. Pancreas: Normal, with no mass or duct dilation. Spleen: Normal size. No mass. Adrenals/Urinary Tract: Normal adrenals. No renal stones. No hydronephrosis. No contour deforming renal masses. Normal bladder. Stomach/Bowel: Normal non-distended stomach.  Moderate periumbilical ventral abdominal hernia containing an incarcerated distal small bowel loop, proximal to which the small bowel is diffusely mildly dilated and fluid-filled (up to 3.1 cm diameter), and distal to which the small bowel is relatively collapsed, compatible with mechanical distal small bowel obstruction. No thick walled small bowel loops. No pneumatosis. Appendectomy. Moderate diffuse colonic diverticulosis with no large bowel wall thickening or significant pericolonic fat stranding. Vascular/Lymphatic: Atherosclerotic nonaneurysmal abdominal aorta. 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 No pathologically enlarged lymph nodes in the abdomen or pelvis. Reproductive: Fluid and gas mildly distends the uterine cavity. Simple 3.1 cm right adnexal cyst (series 2/image 64). No left adnexal mass. Other: No pneumoperitoneum, ascites or focal fluid collection. Musculoskeletal: No aggressive appearing focal osseous lesions. Mild thoracolumbar spondylosis. IMPRESSION: 1. Mechanical distal small bowel obstruction due to incarcerated distal small bowel loop within a moderate size periumbilical ventral abdominal hernia. No free air. No abscess. No pneumatosis. Surgical consultation advised. 2. Moderate diffuse colonic diverticulosis with no evidence of acute diverticulitis. 3. Fluid and gas mildly distends the uterine cavity, nonspecific. Recommend correlation with pelvic ultrasound on a short term outpatient basis. 4. Simple 3.1 cm right adnexal cyst. Recommend follow-up US in 6-12 months. Note: This recommendation does not apply to premenarchal patients and to those with increased risk (genetic, family history,  elevated tumor markers or other high-risk factors) of ovarian cancer. Reference: JACR 2020 Feb; 17(2):248-254 5. Aortic Atherosclerosis (ICD10-I70.0). Electronically Signed   By: Ilona Sorrel M.D.    On: 11/03/2020 13:46   DG Abd Portable 1V  Result Date: 11/04/2020 CLINICAL DATA:  Abdominal pain EXAM: PORTABLE ABDOMEN - 1 VIEW COMPARISON:  11/03/2020 FINDINGS: Enteric catheter remains positioned within the distal stomach. Enteric contrast has progressed and is now present throughout the colon. Single prominent loop of bowel within the right hemiabdomen, which may represent an air-filled cecum. Otherwise, no evidence of bowel obstruction. IMPRESSION: 1. Enteric tube remains positioned within the distal stomach. 2. Single prominent loop of bowel within the right hemiabdomen, which may represent an air-filled cecum. Otherwise, no evidence of bowel obstruction. Oral contrast has progressed throughout the colon. Electronically Signed   By: Davina Poke D.O.   On: 11/04/2020 09:21   DG Abd Portable 1 View  Result Date: 11/03/2020 CLINICAL DATA:  Enteric catheter placement EXAM: PORTABLE ABDOMEN - 1 VIEW COMPARISON:  11/03/2020 FINDINGS: Frontal view of the lower chest and upper abdomen demonstrates advancement of the enteric catheter, tip and side port coiled over the gastric antrum. Fluid-filled loops of bowel are seen throughout the abdomen and pelvis. Lung bases are clear. IMPRESSION: 1. Enteric catheter tip coiled over the gastric antrum. Electronically Signed   By: Randa Ngo M.D.   On: 11/03/2020 23:31   DG Abd Portable 1 View  Result Date: 11/03/2020 CLINICAL DATA:  NG tube placement EXAM: PORTABLE ABDOMEN - 1 VIEW COMPARISON:  None. FINDINGS: NG tube tip is in the proximal stomach with the side port at the GE junction. IMPRESSION: NG tube tip in the stomach. Electronically Signed   By: Rolm Baptise M.D.   On: 11/03/2020 22:51   MYOCARDIAL PERFUSION IMAGING  Result Date: 11/12/2020  Nuclear stress EF: 69%.  The left ventricular ejection fraction is hyperdynamic (>65%).  There was no ST segment deviation noted during stress.  Defect 1: There is a medium defect of severe severity  present in the apical septal, apical inferior, apical lateral and apex location.  Findings consistent with prior myocardial infarction.  This is an intermediate risk study.  There is a medium size, fixed perfusion defect at the apical cap (segment 17) and in the apical septum, inferoapex and lateral apex: There is absent fixed perfusion abnormality in the apical cap, apical septum, and lateral apex. There is severe fixed perfusion defect in the inferoapex. Normal mid and basal perfusion. Findings consistent with prior apical infarct. Wall motion abnormalities at apex. Intermediate risk study due to prior infarct.   ECHOCARDIOGRAM COMPLETE  Result Date: 11/05/2020    ECHOCARDIOGRAM REPORT   Patient Name:   Erin Mitchell Date of Exam: 11/05/2020 Medical Rec #:  [id number]        Height:       64.0 in Accession #:    [id number]       Weight:       153.7 lb Date of Birth:  1945-11-18        BSA:          1.749 m Patient Age:    78 years         BP:           104/54 mmHg Patient Gender: F                HR:           80 bpm. Exam Location:  Inpatient Procedure: 2D Echo, Cardiac Doppler and Color Doppler Indications:    I48.91* Unspeicified atrial fibrillation  History:        Patient has no prior history of Echocardiogram examinations.                 Arrythmias:Atrial Fibrillation; Risk Factors:Hypertension and                 Diabetes.  Sonographer:    Bernadene Person RDCS Referring Phys: [employer]  1. Left ventricular ejection fraction, by estimation, is 65 to 70%. The left ventricle has normal function. The left ventricle has no regional wall motion abnormalities. There is mild left ventricular hypertrophy of the basal-septal segment. Left ventricular diastolic parameters are consistent with Grade I diastolic dysfunction (impaired relaxation).  2. Right ventricular systolic function is normal. The right ventricular size is normal. There is normal pulmonary artery systolic pressure.  3. The  mitral valve is normal in structure. No evidence of mitral valve regurgitation. No evidence of mitral stenosis.  4. The aortic valve is normal in structure. Aortic valve regurgitation is not visualized. No aortic stenosis is present.  5. The inferior vena cava is normal in size with greater than 50% respiratory variability, suggesting right atrial pressure of 3 mmHg. FINDINGS  Left Ventricle: Left ventricular ejection fraction, by estimation, is 65 to 70%. The left ventricle has normal function. The left ventricle has no regional wall motion abnormalities. The left ventricular internal cavity size was normal in size. There is  mild left ventricular hypertrophy of the basal-septal segment. Left ventricular diastolic parameters are consistent with Grade I diastolic dysfunction (impaired relaxation). Normal left ventricular filling pressure. Right Ventricle: The right ventricular size is normal. No increase in right ventricular wall thickness. Right ventricular systolic function is normal. There is normal pulmonary artery systolic pressure. The tricuspid regurgitant velocity is 2.39 m/s, and  with an assumed right atrial pressure of 3 mmHg, the estimated right ventricular systolic pressure is 20.3 mmHg. Left Atrium: Left atrial size was normal in size. Right Atrium: Right atrial size was normal in size. Pericardium: There is no evidence of pericardial effusion. Mitral Valve: The mitral valve is normal in structure. Mild mitral annular calcification. No evidence of mitral valve regurgitation. No evidence of mitral valve stenosis. Tricuspid Valve: The tricuspid valve is normal in structure. Tricuspid valve regurgitation is trivial. No evidence of tricuspid stenosis. Aortic Valve: The aortic valve is normal in structure. Aortic valve regurgitation is not visualized. No aortic stenosis is present. Pulmonic Valve: The pulmonic valve was normal in structure. Pulmonic valve regurgitation is not visualized. No evidence of  pulmonic stenosis. Aorta: The aortic root is normal in size and structure. Venous: The inferior vena cava is normal in size with greater than 50% respiratory variability, suggesting right atrial pressure of 3 mmHg. IAS/Shunts: There is redundancy of the interatrial septum. No atrial level shunt detected by color flow Doppler.  LEFT VENTRICLE PLAX 2D LVIDd:         4.20 cm  Diastology LVIDs:         2.60 cm  LV e' medial:    4.50 cm/s LV PW:         1.00 cm  LV E/e' medial:  15.1 LV IVS:        1.20 cm  LV e' lateral:   7.43 cm/s LVOT diam:     2.00 cm  LV E/e' lateral: 9.1 LV SV:  86 LV SV Index:   49 LVOT Area:     3.14 cm  RIGHT VENTRICLE RV S prime:     12.40 cm/s TAPSE (M-mode): 2.6 cm LEFT ATRIUM             Index       RIGHT ATRIUM           Index LA diam:        3.10 cm 1.77 cm/m  RA Area:     11.50 cm LA Vol (A2C):   45.3 ml 25.90 ml/m RA Volume:   23.60 ml  13.49 ml/m LA Vol (A4C):   45.3 ml 25.90 ml/m LA Biplane Vol: 46.2 ml 26.41 ml/m  AORTIC VALVE LVOT Vmax:   137.00 cm/s LVOT Vmean:  97.500 cm/s LVOT VTI:    0.273 m  AORTA Ao Root diam: 3.20 cm Ao Asc diam:  3.10 cm MITRAL VALVE                TRICUSPID VALVE MV Area (PHT): 3.63 cm     TR Peak grad:   22.8 mmHg MV Decel Time: 209 msec     TR Vmax:        239.00 cm/s MV E velocity: 67.90 cm/s MV A velocity: 106.00 cm/s  SHUNTS MV E/A ratio:  0.64         Systemic VTI:  0.27 m                             Systemic Diam: 2.00 cm Fransico Him MD Electronically signed by Fransico Him MD Signature Date/Time: 11/05/2020/2:09:10 PM    Final    Intravitreal Injection, Pharmacologic Agent - OD - Right Eye  Result Date: 11/25/2020 Time Out 11/25/2020. 9:31 AM. Confirmed correct patient, procedure, site, and patient consented. Anesthesia Topical anesthesia was used. Anesthetic medications included Lidocaine 2%, Proparacaine 0.5%. Procedure Preparation included 5% betadine to ocular surface. A (32g) needle was used. Injection: 2 mg aflibercept 2  MG/0.05ML   Route: Intravitreal, Site: Right Eye   NDC: 65681-275-17, Lot: [id number], Expiration date: 06/05/2021, Waste: 0.05 mL Post-op Post injection exam found visual acuity of at least counting fingers. The patient tolerated the procedure well. There were no complications. The patient received written and verbal post procedure care education. Post injection medications were not given. Notes **SAMPLE MEDICATION ADMINISTERED**   OCT, Retina - OU - Both Eyes  Result Date: 11/25/2020 Right Eye Quality was good. Central Foveal Thickness: 470. Progression has no prior data. Findings include abnormal foveal contour, intraretinal fluid, no SRF, vitreomacular adhesion . Left Eye Quality was good. Central Foveal Thickness: 255. Progression has no prior data. Findings include abnormal foveal contour, no SRF, intraretinal fluid, vitreomacular adhesion (Non-central cystic changes temporal and inferior macula caught on widefield). Notes *Images captured and stored on drive Diagnosis / Impression: OD: +central DME OS: Non-central cystic changes temporal and inferior macula caught on widefield Clinical management: See below Abbreviations: NFP - Normal foveal profile. CME - cystoid macular edema. PED - pigment epithelial detachment. IRF - intraretinal fluid. SRF - subretinal fluid. EZ - ellipsoid zone. ERM - epiretinal membrane. ORA - outer retinal atrophy. ORT - outer retinal tubulation. SRHM - subretinal hyper-reflective material. IRHM - intraretinal hyper-reflective material     Radiology CT ABDOMEN PELVIS WO CONTRAST  Result Date: 12/01/2020 CLINICAL DATA:  Mid abdominal pain, nausea, vomiting EXAM: CT ABDOMEN AND PELVIS WITHOUT CONTRAST TECHNIQUE: Multidetector CT imaging of the  abdomen and pelvis was performed following the standard protocol without IV contrast. COMPARISON:  None. FINDINGS: Lower chest: The visualized lung bases are clear bilaterally. The visualized heart and pericardium are unremarkable.  Hepatobiliary: No focal liver abnormality is seen. Status post cholecystectomy. No biliary dilatation. 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 Patency not well assessed on this noncontrast examination. The spleen is otherwise unremarkable. Adrenals/Urinary Tract: Adrenal glands are unremarkable. Kidneys are normal, without renal calculi, focal lesion, or hydronephrosis. Bladder is unremarkable. Stomach/Bowel: The stomach is unremarkable. The small bowel is diffusely mildly dilated and fluid-filled without a focal point of transition identified. The colon, additionally, appears diffusely fluid-filled. The findings are nonspecific, but together, may be seen in the setting of enterocolitis. No evidence of obstruction or focal inflammation. No free intraperitoneal gas or fluid. Vascular/Lymphatic: There is severe aortoiliac atherosclerotic calcification. No aortic aneurysm. Moderate atherosclerotic calcification is seen within the proximal superior mesenteric artery. No pathologic adenopathy within the abdomen and pelvis. Reproductive: The endometrium is thickened and there is a tiny focus of gas side can identified within the endometrial cavity. Stable 2.9 cm right adnexal cystic lesion. Other: Moderate fat containing infraumbilical ventral hernia is again identified, unchanged. Musculoskeletal: No acute bone abnormality. No lytic or blastic bone lesion identified. IMPRESSION: Diffusely mildly dilated fluid-filled small bowel as well as fluid-filled large bowel. The findings, while nonspecific, can be seen the setting of gastroenteritis. Clinical correlation would be helpful. Peripheral vascular disease with particularly prominent atherosclerotic calcification within the superior mesenteric artery. If there is clinical evidence of chronic mesenteric ischemia, CT arteriography would be more helpful to assess the degree of vascular compromise. Stable moderate fat  containing ventral hernia. Thickening of the endometrium, not well assessed on this examination. Dedicated pelvic sonography is recommended for further evaluation once the patient's acute issues have resolved. Aortic Atherosclerosis (ICD10-I70.0). Electronically Signed   By: Fidela Salisbury MD   On: 12/01/2020 01:53    Procedures Procedures   Medications Ordered in ED Medications  0.9 %  sodium chloride infusion (has no administration in time range)  cefTRIAXone (ROCEPHIN) 1 g in sodium chloride 0.9 % 100 mL IVPB (has no administration in time range)  ketorolac (TORADOL) 30 MG/ML injection 15 mg (has no administration in time range)    ED Course  I have reviewed the triage vital signs and the nursing notes.  Pertinent labs & imaging results that were available during my care of the patient were reviewed by me and considered in my medical decision making (see chart for details).   Admit to ileus and UTI,  will hydrate.    Erin Mitchell was evaluated in Emergency Department on 12/01/2020 for the symptoms described in the history of present illness. She was evaluated in the context of the global COVID-19 pandemic, which necessitated consideration that the patient might be at risk for infection with the SARS-CoV-2 virus that causes COVID-19. Institutional protocols and algorithms that pertain to the evaluation of patients at risk for COVID-19 are in a state of rapid change based on information released by regulatory bodies including the CDC and federal and state organizations. These policies and algorithms were followed during the patient's care in the ED.  Final Clinical Impression(s) / ED Diagnoses Final diagnoses:  None   Admit to medicine  Rx / DC Orders ED Discharge Orders     None        Symir Mah, MD 12/01/20 [id number]

## 2020-12-01 NOTE — ED Triage Notes (Signed)
Patient reports pain across her abdomen with nausea this evening , no diarrhea , patient stated history of abdominal hernia , denies fever or chills.

## 2020-12-02 DIAGNOSIS — N179 Acute kidney failure, unspecified: Secondary | ICD-10-CM | POA: Diagnosis not present

## 2020-12-02 DIAGNOSIS — K436 Other and unspecified ventral hernia with obstruction, without gangrene: Secondary | ICD-10-CM | POA: Diagnosis not present

## 2020-12-02 LAB — CBC
HCT: 32 % — ABNORMAL LOW (ref 36.0–46.0)
Hemoglobin: 10.5 g/dL — ABNORMAL LOW (ref 12.0–15.0)
MCH: 28.8 pg (ref 26.0–34.0)
MCHC: 32.8 g/dL (ref 30.0–36.0)
MCV: 87.9 fL (ref 80.0–100.0)
Platelets: 221 10*3/uL (ref 150–400)
RBC: 3.64 MIL/uL — ABNORMAL LOW (ref 3.87–5.11)
RDW: 13.5 % (ref 11.5–15.5)
WBC: 7.6 10*3/uL (ref 4.0–10.5)
nRBC: 0 % (ref 0.0–0.2)

## 2020-12-02 LAB — BASIC METABOLIC PANEL
Anion gap: 4 — ABNORMAL LOW (ref 5–15)
BUN: 18 mg/dL (ref 8–23)
CO2: 22 mmol/L (ref 22–32)
Calcium: 8.2 mg/dL — ABNORMAL LOW (ref 8.9–10.3)
Chloride: 112 mmol/L — ABNORMAL HIGH (ref 98–111)
Creatinine, Ser: 0.69 mg/dL (ref 0.44–1.00)
GFR, Estimated: >60 mL/min (ref >60)
Glucose, Bld: 145 mg/dL — ABNORMAL HIGH (ref 70–99)
Potassium: 3.9 mmol/L (ref 3.5–5.1)
Sodium: 138 mmol/L (ref 135–145)

## 2020-12-02 LAB — GLUCOSE, CAPILLARY
Glucose-Capillary: 98 mg/dL (ref 70–99)
Glucose-Capillary: 144 mg/dL — ABNORMAL HIGH (ref 70–99)

## 2020-12-02 MED ORDER — METOPROLOL TARTRATE 25 MG PO TABS: 25.0000 mg | ORAL_TABLET | Freq: Two times a day (BID) | ORAL | Status: DC

## 2020-12-02 MED ORDER — METFORMIN HCL 500 MG PO TABS: 1000.0000 mg | ORAL_TABLET | Freq: Two times a day (BID) | ORAL | Status: DC

## 2020-12-02 NOTE — Progress Notes (Addendum)
Nutrition Brief Note  Patient identified on the Malnutrition Screening Tool (MST) Report.  Admitting Dx: Lower urinary tract infectious disease [N39.0] Enteritis [K52.9] Gastroenteritis [K52.9] Leukocytosis, unspecified type [D72.829] Nausea and vomiting, intractability of vomiting not specified, unspecified vomiting type [R11.2] PMH:  Past Medical History:  Diagnosis Date   Atrial fibrillation (HCC)    on Plavix   Cataract    Diabetes (HCC)    Diabetic retinopathy (HCC)    Exposure to Agent Orange    Hypertension    hypothyroidism    Kidney disease    Medications:  Scheduled Meds:  insulin aspart  0-15 Units Subcutaneous TID WC   metoprolol tartrate  25 mg Oral BID WC   rosuvastatin  40 mg Oral QHS   thyroid  90 mg Oral QAC breakfast   Continuous Infusions:  sodium chloride 75 mL/hr at 12/01/20 2048   Labs: Recent Labs  Lab 11/27/20 1002 12/01/20 0118 12/02/20 0500  NA 140 134* 138  K 4.3 4.1 3.9  CL 103 103 112*  CO2 20 18* 22  BUN 19 29* 18  CREATININE 0.78 1.22* 0.69  CALCIUM 8.9 9.6 8.2*  GLUCOSE 184* 260* 145*  CBGs: 98-255-144  Wt Readings from Last 15 Encounters:  12/02/20 68.8 kg  11/20/20 67.4 kg  11/18/20 67.6 kg  11/12/20 72.6 kg  11/07/20 72.8 kg  11/03/20 67.1 kg  10/08/20 69.5 kg   Body mass index is 26.04 kg/m. Patient meets criteria for overweight based on current BMI.   Current diet order is soft, patient is consuming approximately 75-100% of meals at this time.   Discussed pt with RN who reports pt is planning to have outpatient procedure for hernia repair and is currently preparing to discharge. No nutrition interventions warranted at this time. If nutrition issues arise, please consult RD.   Eugene Gavia, MS, RD, LDN (she/her/hers) RD pager number and weekend/on-call pager number located in Amion.

## 2020-12-02 NOTE — Progress Notes (Signed)
DISCHARGE NOTE HOME Honesty Menta to be discharged Home per MD order. Discussed prescriptions and follow up appointments with the patient. Prescriptions given to patient; medication list explained in detail. Patient verbalized understanding.  Skin clean, dry and intact without evidence of skin break down, no evidence of skin tears noted. IV catheter discontinued intact. Site without signs and symptoms of complications. Dressing and pressure applied. Pt denies pain at the site currently. No complaints noted.  Patient free of lines, drains, and wounds.   An After Visit Summary (AVS) was printed and given to the patient. Patient escorted via wheelchair, and discharged home via private auto.  Jad Johansson S Azura Tufaro, RN

## 2020-12-02 NOTE — Progress Notes (Addendum)
Patient have had 5 episodes of diarrhea since shift change. Dr. Leafy Half notified and advised to monitor patient.   Donia Guiles, RN.

## 2020-12-03 NOTE — Discharge Summary (Signed)
Physician Discharge Summary  Erin Mitchell SWH:[id number] DOB: Nov 21, 1945 DOA: 12/01/2020  PCP: Everrett Coombe, DO  Admit date: 12/01/2020 Discharge date: 12/02/2020  Admitted From: Home.  Disposition:  Home.   Recommendations for Outpatient Follow-up:  Follow up with PCP in 1-2 weeks Please obtain BMP/CBC in one week Please follow up with general surgery as scheduled.   Discharge Condition:stable.  CODE STATUS:full code.  Diet recommendation: Heart Healthy /  Brief/Interim Summary: Brithney Mitchell is a 75 y.o. female with medical history significant of afib; DM; HTN; HLD; hypothyroidism; and recent admission for incarcerated hernia (11/03/20) presenting with abdominal pain.  CT showing findings consistent with gastroenteritis.  Patient was given ceftriaxone, IV fluids, antiemetic, and pain medication.  Discharge Diagnoses:  Principal Problem:   Incarcerated ventral hernia Active Problems:   Type 2 diabetes mellitus with diabetic peripheral angiopathy and gangrene, without long-term current use of insulin (HCC)   PAF (paroxysmal atrial fibrillation) (HCC)   Acquired hypothyroidism   AKI (acute kidney injury) (HCC)   Hypertension    Ventral hernia, possibly incarcerated -Patient previously hospitalized in early June for incarcerated hernia with SGO -She was planned for outpatient laparoscopic hernia repair -She returned overnight with severe abdominal pain and nausea that occurred with the pain -Her symptoms have since resolved -She had a normal BM last night -Suspect that this was another episode of hernia incarceration that resolved spontaneously -CT with nonspecific findings, ?gastroenteritis - but clinical picture does not fit this scenario Restarted plavix on discharge.    AKI -Appears to be mildly dehydrated -Diagnosed with UTI in the ER and given antibiotics, but patient is without symptoms and her UA is more c/w dehydration Rehydrated. And creatinine is back  to baseline.    Afib -Rate controlled with Lopressor -Not on AC because this was a single peri-operative event   HTN Sub optimal, restarted the home meds.    HLD -Continue Crestor   DM -Recent A1c was 6.8, indicating good control Resume home meds.    Hypothyroidism  -Continue Armour thyroid at current dose for now        Discharge Instructions  Discharge Instructions     Diet - low sodium heart healthy   Complete by: As directed    Discharge instructions   Complete by: As directed    Please follow up with PCP in one week, please follow up with Surgeon as recommended.      Allergies as of 12/02/2020   No Known Allergies      Medication List     STOP taking these medications    loperamide 2 MG tablet Commonly known as: IMODIUM A-D       TAKE these medications    B-D SINGLE USE SWABS REGULAR Pads Use as directed.   clopidogrel 75 MG tablet Commonly known as: PLAVIX TAKE 1 TABLET BY MOUTH ONCE DAILY What changed:  how much to take how to take this when to take this additional instructions   losartan 50 MG tablet Commonly known as: COZAAR Take 1 tablet (50 mg total) by mouth daily.   metFORMIN 500 MG tablet Commonly known as: GLUCOPHAGE Take 2 tablets (1,000 mg total) by mouth 2 (two) times daily with a meal.   metoprolol tartrate 25 MG tablet Commonly known as: LOPRESSOR Take 1 tablet (25 mg total) by mouth 2 (two) times daily with a meal.   NP Thyroid 90 MG tablet Generic drug: thyroid Take 1 tablet (90 mg total) by mouth daily before breakfast.  PAPAYA ENZYME PO Take 2 capsules by mouth daily as needed (heartburn).   rosuvastatin 40 MG tablet Commonly known as: CRESTOR TAKE 1 TABLET BY MOUTH ONCE DAILY What changed:  how much to take how to take this when to take this additional instructions   saccharomyces boulardii 250 MG capsule Commonly known as: FLORASTOR Take 1 capsule (250 mg total) by mouth 2 (two) times daily.    True Metrix Blood Glucose Test test strip Generic drug: glucose blood Use as instructed What changed: additional instructions   True Metrix Meter w/Device Kit Use as directed to test blood glucose daily   TRUEplus Lancets 33G Misc Use as directed daily.        Follow-up Information     Luetta Nutting, DO. Call in 1 week(s).   Specialty: Family Medicine Contact information: [address] [place] [address] [phone]                No Known Allergies  Consultations: General surgery.    Procedures/Studies: CT ABDOMEN PELVIS WO CONTRAST  Result Date: 12/01/2020 CLINICAL DATA:  Mid abdominal pain, nausea, vomiting EXAM: CT ABDOMEN AND PELVIS WITHOUT CONTRAST TECHNIQUE: Multidetector CT imaging of the abdomen and pelvis was performed following the standard protocol without IV contrast. COMPARISON:  None. FINDINGS: Lower chest: The visualized lung bases are clear bilaterally. The visualized heart and pericardium are unremarkable. Hepatobiliary: No focal liver abnormality is seen. Status post cholecystectomy. No biliary dilatation. 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 Patency not well assessed on this noncontrast examination. The spleen is otherwise unremarkable. Adrenals/Urinary Tract: Adrenal glands are unremarkable. Kidneys are normal, without renal calculi, focal lesion, or hydronephrosis. Bladder is unremarkable. Stomach/Bowel: The stomach is unremarkable. The small bowel is diffusely mildly dilated and fluid-filled without a focal point of transition identified. The colon, additionally, appears diffusely fluid-filled. The findings are nonspecific, but together, may be seen in the setting of enterocolitis. No evidence of obstruction or focal inflammation. No free intraperitoneal gas or fluid. Vascular/Lymphatic: There is severe aortoiliac atherosclerotic calcification. No aortic aneurysm.  Moderate atherosclerotic calcification is seen within the proximal superior mesenteric artery. No pathologic adenopathy within the abdomen and pelvis. Reproductive: The endometrium is thickened and there is a tiny focus of gas side can identified within the endometrial cavity. Stable 2.9 cm right adnexal cystic lesion. Other: Moderate fat containing infraumbilical ventral hernia is again identified, unchanged. Musculoskeletal: No acute bone abnormality. No lytic or blastic bone lesion identified. IMPRESSION: Diffusely mildly dilated fluid-filled small bowel as well as fluid-filled large bowel. The findings, while nonspecific, can be seen the setting of gastroenteritis. Clinical correlation would be helpful. Peripheral vascular disease with particularly prominent atherosclerotic calcification within the superior mesenteric artery. If there is clinical evidence of chronic mesenteric ischemia, CT arteriography would be more helpful to assess the degree of vascular compromise. Stable moderate fat containing ventral hernia. Thickening of the endometrium, not well assessed on this examination. Dedicated pelvic sonography is recommended for further evaluation once the patient's acute issues have resolved. Aortic Atherosclerosis (ICD10-I70.0). Electronically Signed   By: Fidela Salisbury MD   On: 12/01/2020 01:53   CT ABDOMEN PELVIS WO CONTRAST  Result Date: 11/06/2020 CLINICAL DATA:  Abdominal pain.  Hernia suspected. EXAM: CT ABDOMEN AND PELVIS WITHOUT CONTRAST TECHNIQUE: Multidetector CT imaging of the abdomen and pelvis was performed following the standard protocol without IV contrast. COMPARISON:  11/03/2020 FINDINGS: Lower chest: Lung bases are clear.  Hepatobiliary: New small amount of perihepatic ascites. Gallbladder has been removed. Pancreas: Unremarkable. No pancreatic ductal dilatation or surrounding inflammatory changes. Spleen: New perisplenic ascites. 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 No splenic enlargement. Adrenals/Urinary Tract: Normal appearance of the adrenal glands. No kidney stones or hydronephrosis. Small amount of fluid in the urinary bladder. Stomach/Bowel: Colon is decompressed. Evidence for diverticular changes in the sigmoid colon and limited evaluation of the sigmoid colon on sequence 3, image 62. There is oral contrast in the right colon. Normal appearance of the stomach without distension. Possible duodenal diverticulum near the second portion of the duodenum. Dilated loops of small bowel in the abdomen measuring up to 2.7 cm. Extensive wall thickening involving loops of distal small bowel in the ileum. Small bowel wall thickening measures up to 1.0 cm. Oral contrast within the distal small bowel. Mesenteric edema. Vascular/Lymphatic: Atherosclerotic calcifications in the aorta and iliac arteries without aneurysm. No lymph node enlargement in the abdomen or pelvis. Reproductive: Small focus of gas at the uterine fundus is similar to the recent comparison examination. Small amount of fluid around the uterine fundus. Again noted is a low-density structure in the right adnexa that measures roughly 3.3 cm. Other: Again noted is a periumbilical ventral hernia. There is no longer bowel within this ventral hernia. The ventral hernia now contains fat with mild stranding. Small amount of free fluid in the lower abdomen and pelvis. Musculoskeletal: No acute bone abnormality. IMPRESSION: 1. Severe bowel wall thickening involving the distal small bowel including the terminal ileum. This represents a significant change from the exam on 11/03/2020. Findings are suggestive for enteritis. New ascites and mesenteric stranding associated with the bowel inflammation. 2. Ventral hernia no longer contains small bowel. 3. Mild distention of small bowel but there is oral contrast in the right colon. 4.  Persistent unusual findings involving the uterus and right adnexa in a patient of this age. Again noted is a small focus of gas within the uterus and low-density right adnexal/ovarian structure. These areas may be better characterized with ultrasound. 5. 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 Recommend annual cross-sectional surveillance. These results will be called to the ordering clinician or representative by the Radiologist Assistant, and communication documented in the PACS or [employer]. Electronically Signed   By: Markus Daft M.D.   On: 11/06/2020 17:49   DG Abd Portable 1V  Result Date: 11/04/2020 CLINICAL DATA:  Abdominal pain EXAM: PORTABLE ABDOMEN - 1 VIEW COMPARISON:  11/03/2020 FINDINGS: Enteric catheter remains positioned within the distal stomach. Enteric contrast has progressed and is now present throughout the colon. Single prominent loop of bowel within the right hemiabdomen, which may represent an air-filled cecum. Otherwise, no evidence of bowel obstruction. IMPRESSION: 1. Enteric tube remains positioned within the distal stomach. 2. Single prominent loop of bowel within the right hemiabdomen, which may represent an air-filled cecum. Otherwise, no evidence of bowel obstruction. Oral contrast has progressed throughout the colon. Electronically Signed   By: Davina Poke D.O.   On: 11/04/2020 09:21   DG Abd Portable 1 View  Result Date: 11/03/2020 CLINICAL DATA:  Enteric catheter placement EXAM: PORTABLE ABDOMEN - 1 VIEW COMPARISON:  11/03/2020 FINDINGS: Frontal view of the lower chest and upper abdomen demonstrates advancement of the enteric catheter, tip and side port coiled over the gastric antrum. Fluid-filled loops of bowel are  seen throughout the abdomen and pelvis. Lung bases are clear. IMPRESSION: 1. Enteric catheter tip coiled over the gastric antrum. Electronically Signed   By: Randa Ngo M.D.   On: 11/03/2020 23:31   DG  Abd Portable 1 View  Result Date: 11/03/2020 CLINICAL DATA:  NG tube placement EXAM: PORTABLE ABDOMEN - 1 VIEW COMPARISON:  None. FINDINGS: NG tube tip is in the proximal stomach with the side port at the GE junction. IMPRESSION: NG tube tip in the stomach. Electronically Signed   By: Rolm Baptise M.D.   On: 11/03/2020 22:51   MYOCARDIAL PERFUSION IMAGING  Result Date: 11/12/2020  Nuclear stress EF: 69%.  The left ventricular ejection fraction is hyperdynamic (>65%).  There was no ST segment deviation noted during stress.  Defect 1: There is a medium defect of severe severity present in the apical septal, apical inferior, apical lateral and apex location.  Findings consistent with prior myocardial infarction.  This is an intermediate risk study.  There is a medium size, fixed perfusion defect at the apical cap (segment 17) and in the apical septum, inferoapex and lateral apex: There is absent fixed perfusion abnormality in the apical cap, apical septum, and lateral apex. There is severe fixed perfusion defect in the inferoapex. Normal mid and basal perfusion. Findings consistent with prior apical infarct. Wall motion abnormalities at apex. Intermediate risk study due to prior infarct.   ECHOCARDIOGRAM COMPLETE  Result Date: 11/05/2020    ECHOCARDIOGRAM REPORT   Patient Name:   AISHWARYA SHIPLETT Date of Exam: 11/05/2020 Medical Rec #:  [id number]        Height:       64.0 in Accession #:    [id number]       Weight:       153.7 lb Date of Birth:  1946-05-01        BSA:          1.749 m Patient Age:    75 years         BP:           104/54 mmHg Patient Gender: F                HR:           80 bpm. Exam Location:  Inpatient Procedure: 2D Echo, Cardiac Doppler and Color Doppler Indications:    I48.91* Unspeicified atrial fibrillation  History:        Patient has no prior history of Echocardiogram examinations.                 Arrythmias:Atrial Fibrillation; Risk Factors:Hypertension and                  Diabetes.  Sonographer:    Bernadene Person RDCS Referring Phys: [place]  1. Left ventricular ejection fraction, by estimation, is 65 to 70%. The left ventricle has normal function. The left ventricle has no regional wall motion abnormalities. There is mild left ventricular hypertrophy of the basal-septal segment. Left ventricular diastolic parameters are consistent with Grade I diastolic dysfunction (impaired relaxation).  2. Right ventricular systolic function is normal. The right ventricular size is normal. There is normal pulmonary artery systolic pressure.  3. The mitral valve is normal in structure. No evidence of mitral valve regurgitation. No evidence of mitral stenosis.  4. The aortic valve is normal in structure. Aortic valve regurgitation is not visualized. No aortic stenosis is present.  5. The inferior vena cava is normal  in size with greater than 50% respiratory variability, suggesting right atrial pressure of 3 mmHg. FINDINGS  Left Ventricle: Left ventricular ejection fraction, by estimation, is 65 to 70%. The left ventricle has normal function. The left ventricle has no regional wall motion abnormalities. The left ventricular internal cavity size was normal in size. There is  mild left ventricular hypertrophy of the basal-septal segment. Left ventricular diastolic parameters are consistent with Grade I diastolic dysfunction (impaired relaxation). Normal left ventricular filling pressure. Right Ventricle: The right ventricular size is normal. No increase in right ventricular wall thickness. Right ventricular systolic function is normal. There is normal pulmonary artery systolic pressure. The tricuspid regurgitant velocity is 2.39 m/s, and  with an assumed right atrial pressure of 3 mmHg, the estimated right ventricular systolic pressure is 34.3 mmHg. Left Atrium: Left atrial size was normal in size. Right Atrium: Right atrial size was normal in size. Pericardium: There is no  evidence of pericardial effusion. Mitral Valve: The mitral valve is normal in structure. Mild mitral annular calcification. No evidence of mitral valve regurgitation. No evidence of mitral valve stenosis. Tricuspid Valve: The tricuspid valve is normal in structure. Tricuspid valve regurgitation is trivial. No evidence of tricuspid stenosis. Aortic Valve: The aortic valve is normal in structure. Aortic valve regurgitation is not visualized. No aortic stenosis is present. Pulmonic Valve: The pulmonic valve was normal in structure. Pulmonic valve regurgitation is not visualized. No evidence of pulmonic stenosis. Aorta: The aortic root is normal in size and structure. Venous: The inferior vena cava is normal in size with greater than 50% respiratory variability, suggesting right atrial pressure of 3 mmHg. IAS/Shunts: There is redundancy of the interatrial septum. No atrial level shunt detected by color flow Doppler.  LEFT VENTRICLE PLAX 2D LVIDd:         4.20 cm  Diastology LVIDs:         2.60 cm  LV e' medial:    4.50 cm/s LV PW:         1.00 cm  LV E/e' medial:  15.1 LV IVS:        1.20 cm  LV e' lateral:   7.43 cm/s LVOT diam:     2.00 cm  LV E/e' lateral: 9.1 LV SV:         86 LV SV Index:   49 LVOT Area:     3.14 cm  RIGHT VENTRICLE RV S prime:     12.40 cm/s TAPSE (M-mode): 2.6 cm LEFT ATRIUM             Index       RIGHT ATRIUM           Index LA diam:        3.10 cm 1.77 cm/m  RA Area:     11.50 cm LA Vol (A2C):   45.3 ml 25.90 ml/m RA Volume:   23.60 ml  13.49 ml/m LA Vol (A4C):   45.3 ml 25.90 ml/m LA Biplane Vol: 46.2 ml 26.41 ml/m  AORTIC VALVE LVOT Vmax:   137.00 cm/s LVOT Vmean:  97.500 cm/s LVOT VTI:    0.273 m  AORTA Ao Root diam: 3.20 cm Ao Asc diam:  3.10 cm MITRAL VALVE                TRICUSPID VALVE MV Area (PHT): 3.63 cm     TR Peak grad:   22.8 mmHg MV Decel Time: 209 msec     TR Vmax:  239.00 cm/s MV E velocity: 67.90 cm/s MV A velocity: 106.00 cm/s  SHUNTS MV E/A ratio:  0.64          Systemic VTI:  0.27 m                             Systemic Diam: 2.00 cm Fransico Him MD Electronically signed by Fransico Him MD Signature Date/Time: 11/05/2020/2:09:10 PM    Final    Intravitreal Injection, Pharmacologic Agent - OD - Right Eye  Result Date: 11/25/2020 Time Out 11/25/2020. 9:31 AM. Confirmed correct patient, procedure, site, and patient consented. Anesthesia Topical anesthesia was used. Anesthetic medications included Lidocaine 2%, Proparacaine 0.5%. Procedure Preparation included 5% betadine to ocular surface. A (32g) needle was used. Injection: 2 mg aflibercept 2 MG/0.05ML   Route: Intravitreal, Site: Right Eye   NDC: 10175-102-58, Lot: [id number], Expiration date: 06/05/2021, Waste: 0.05 mL Post-op Post injection exam found visual acuity of at least counting fingers. The patient tolerated the procedure well. There were no complications. The patient received written and verbal post procedure care education. Post injection medications were not given. Notes **SAMPLE MEDICATION ADMINISTERED**   OCT, Retina - OU - Both Eyes  Result Date: 11/25/2020 Right Eye Quality was good. Central Foveal Thickness: 470. Progression has no prior data. Findings include abnormal foveal contour, intraretinal fluid, no SRF, vitreomacular adhesion . Left Eye Quality was good. Central Foveal Thickness: 255. Progression has no prior data. Findings include abnormal foveal contour, no SRF, intraretinal fluid, vitreomacular adhesion (Non-central cystic changes temporal and inferior macula caught on widefield). Notes *Images captured and stored on drive Diagnosis / Impression: OD: +central DME OS: Non-central cystic changes temporal and inferior macula caught on widefield Clinical management: See below Abbreviations: NFP - Normal foveal profile. CME - cystoid macular edema. PED - pigment epithelial detachment. IRF - intraretinal fluid. SRF - subretinal fluid. EZ - ellipsoid zone. ERM - epiretinal membrane. ORA - outer  retinal atrophy. ORT - outer retinal tubulation. SRHM - subretinal hyper-reflective material. IRHM - intraretinal hyper-reflective material      Subjective: No new complaints.   Discharge Exam: Vitals:   12/02/20 0416 12/02/20 0827  BP: (!) 159/78 (!) 171/80  Pulse: 72 79  Resp: 17 20  Temp: 98.9 F (37.2 C) 98.1 F (36.7 C)  SpO2: 96% 96%   Vitals:   12/01/20 2110 12/01/20 2300 12/02/20 0416 12/02/20 0827  BP: (!) 157/77 128/76 (!) 159/78 (!) 171/80  Pulse: 76 68 72 79  Resp: $Remo'18 18 17 20  'RuFwV$ Temp: 98.4 F (36.9 C) 99.1 F (37.3 C) 98.9 F (37.2 C) 98.1 F (36.7 C)  TempSrc: Oral Oral Oral Oral  SpO2: 99% 97% 96% 96%  Weight: 68.8 kg   68.8 kg  Height:        General: Pt is alert, awake, not in acute distress Cardiovascular: RRR, S1/S2 +, no rubs, no gallops Respiratory: CTA bilaterally, no wheezing, no rhonchi Abdominal: Soft, NT, ND, bowel sounds + Extremities: no edema, no cyanosis    The results of significant diagnostics from this hospitalization (including imaging, microbiology, ancillary and laboratory) are listed below for reference.     Microbiology: Recent Results (from the past 240 hour(s))  Resp Panel by RT-PCR (Flu A&B, Covid) Nasopharyngeal Swab     Status: None   Collection Time: 12/01/20  6:46 AM   Specimen: Nasopharyngeal Swab; Nasopharyngeal(NP) swabs in vial transport medium  Result Value Ref Range Status  SARS Coronavirus 2 by RT PCR NEGATIVE NEGATIVE Final    Comment: (NOTE) SARS-CoV-2 target nucleic acids are NOT DETECTED.  The SARS-CoV-2 RNA is generally detectable in upper respiratory specimens during the acute phase of infection. The lowest concentration of SARS-CoV-2 viral copies this assay can detect is 138 copies/mL. A negative result does not preclude SARS-Cov-2 infection and should not be used as the sole basis for treatment or other patient management decisions. A negative result may occur with  improper specimen  collection/handling, submission of specimen other than nasopharyngeal swab, presence of viral mutation(s) within the areas targeted by this assay, and inadequate number of viral copies(<138 copies/mL). A negative result must be combined with clinical observations, patient history, and epidemiological information. The expected result is Negative.  Fact Sheet for Patients:  EntrepreneurPulse.com.au  Fact Sheet for Healthcare Providers:  IncredibleEmployment.be  This test is no t yet approved or cleared by the [place] FDA and  has been authorized for detection and/or diagnosis of SARS-CoV-2 by FDA under an Emergency Use Authorization (EUA). This EUA will remain  in effect (meaning this test can be used) for the duration of the COVID-19 declaration under Section 564(b)(1) of the Act, 21 U.S.C.section 360bbb-3(b)(1), unless the authorization is terminated  or revoked sooner.       Influenza A by PCR NEGATIVE NEGATIVE Final   Influenza B by PCR NEGATIVE NEGATIVE Final    Comment: (NOTE) The Xpert Xpress SARS-CoV-2/FLU/RSV plus assay is intended as an aid in the diagnosis of influenza from Nasopharyngeal swab specimens and should not be used as a sole basis for treatment. Nasal washings and aspirates are unacceptable for Xpert Xpress SARS-CoV-2/FLU/RSV testing.  Fact Sheet for Patients: EntrepreneurPulse.com.au  Fact Sheet for Healthcare Providers: IncredibleEmployment.be  This test is not yet approved or cleared by the [place] FDA and has been authorized for detection and/or diagnosis of SARS-CoV-2 by FDA under an Emergency Use Authorization (EUA). This EUA will remain in effect (meaning this test can be used) for the duration of the COVID-19 declaration under Section 564(b)(1) of the Act, 21 U.S.C. section 360bbb-3(b)(1), unless the authorization is terminated or revoked.  Performed at [facility], [place] [address] [place], [place] [address]      Labs: BNP (last 3 results) No results for input(s): BNP in the last 8760 hours. Basic Metabolic Panel: Recent Labs  Lab 11/27/20 1002 12/01/20 0118 12/02/20 0500  NA 140 134* 138  K 4.3 4.1 3.9  CL 103 103 112*  CO2 20 18* 22  GLUCOSE 184* 260* 145*  BUN 19 29* 18  CREATININE 0.78 1.22* 0.69  CALCIUM 8.9 9.6 8.2*   Liver Function Tests: Recent Labs  Lab 12/01/20 0118  AST 24  ALT 22  ALKPHOS 60  BILITOT 0.5  PROT 7.1  ALBUMIN 3.9   Recent Labs  Lab 12/01/20 0118  LIPASE 38   No results for input(s): AMMONIA in the last 168 hours. CBC: Recent Labs  Lab 12/01/20 0118 12/02/20 0500  WBC 19.1* 7.6  NEUTROABS 15.8*  --   HGB 12.8 10.5*  HCT 39.0 32.0*  MCV 88.0 87.9  PLT 310 221   Cardiac Enzymes: No results for input(s): CKTOTAL, CKMB, CKMBINDEX, TROPONINI in the last 168 hours. BNP: Invalid input(s): POCBNP CBG: Recent Labs  Lab 12/01/20 1122 12/01/20 1638 12/01/20 2109 12/02/20 0645  GLUCAP 126* 98 255* 144*   D-Dimer No results for input(s): DDIMER in the last 72 hours. Hgb A1c No results  for input(s): HGBA1C in the last 72 hours. Lipid Profile No results for input(s): CHOL, HDL, LDLCALC, TRIG, CHOLHDL, LDLDIRECT in the last 72 hours. Thyroid function studies No results for input(s): TSH, T4TOTAL, T3FREE, THYROIDAB in the last 72 hours.  Invalid input(s): FREET3 Anemia work up No results for input(s): VITAMINB12, FOLATE, FERRITIN, TIBC, IRON, RETICCTPCT in the last 72 hours. Urinalysis    Component Value Date/Time   COLORURINE AMBER (A) 12/01/2020 0119   APPEARANCEUR CLOUDY (A) 12/01/2020 0119   LABSPEC 1.016 12/01/2020 0119   PHURINE 5.0 12/01/2020 0119   GLUCOSEU NEGATIVE 12/01/2020 0119   HGBUR SMALL (A) 12/01/2020 0119   BILIRUBINUR NEGATIVE 12/01/2020 0119   KETONESUR 5 (A) 12/01/2020 0119   PROTEINUR 100 (A) 12/01/2020 0119   NITRITE NEGATIVE 12/01/2020 0119    LEUKOCYTESUR LARGE (A) 12/01/2020 0119   Sepsis Labs Invalid input(s): PROCALCITONIN,  WBC,  LACTICIDVEN Microbiology Recent Results (from the past 240 hour(s))  Resp Panel by RT-PCR (Flu A&B, Covid) Nasopharyngeal Swab     Status: None   Collection Time: 12/01/20  6:46 AM   Specimen: Nasopharyngeal Swab; Nasopharyngeal(NP) swabs in vial transport medium  Result Value Ref Range Status   SARS Coronavirus 2 by RT PCR NEGATIVE NEGATIVE Final    Comment: (NOTE) SARS-CoV-2 target nucleic acids are NOT DETECTED.  The SARS-CoV-2 RNA is generally detectable in upper respiratory specimens during the acute phase of infection. The lowest concentration of SARS-CoV-2 viral copies this assay can detect is 138 copies/mL. A negative result does not preclude SARS-Cov-2 infection and should not be used as the sole basis for treatment or other patient management decisions. A negative result may occur with  improper specimen collection/handling, submission of specimen other than nasopharyngeal swab, presence of viral mutation(s) within the areas targeted by this assay, and inadequate number of viral copies(<138 copies/mL). A negative result must be combined with clinical observations, patient history, and epidemiological information. The expected result is Negative.  Fact Sheet for Patients:  EntrepreneurPulse.com.au  Fact Sheet for Healthcare Providers:  IncredibleEmployment.be  This test is no t yet approved or cleared by the [place] FDA and  has been authorized for detection and/or diagnosis of SARS-CoV-2 by FDA under an Emergency Use Authorization (EUA). This EUA will remain  in effect (meaning this test can be used) for the duration of the COVID-19 declaration under Section 564(b)(1) of the Act, 21 U.S.C.section 360bbb-3(b)(1), unless the authorization is terminated  or revoked sooner.       Influenza A by PCR NEGATIVE NEGATIVE Final   Influenza B  by PCR NEGATIVE NEGATIVE Final    Comment: (NOTE) The Xpert Xpress SARS-CoV-2/FLU/RSV plus assay is intended as an aid in the diagnosis of influenza from Nasopharyngeal swab specimens and should not be used as a sole basis for treatment. Nasal washings and aspirates are unacceptable for Xpert Xpress SARS-CoV-2/FLU/RSV testing.  Fact Sheet for Patients: EntrepreneurPulse.com.au  Fact Sheet for Healthcare Providers: IncredibleEmployment.be  This test is not yet approved or cleared by the [place] FDA and has been authorized for detection and/or diagnosis of SARS-CoV-2 by FDA under an Emergency Use Authorization (EUA). This EUA will remain in effect (meaning this test can be used) for the duration of the COVID-19 declaration under Section 564(b)(1) of the Act, 21 U.S.C. section 360bbb-3(b)(1), unless the authorization is terminated or revoked.  Performed at [facility], [place] [address], [place], [place] [address]      Time coordinating discharge: 33 minutes.   SIGNED:  Hosie Poisson, MD  [facility] 12/03/2020, 6:15 PM

## 2020-12-17 ENCOUNTER — Ambulatory Visit: Payer: Self-pay | Admitting: Surgery

## 2020-12-17 DIAGNOSIS — K432 Incisional hernia without obstruction or gangrene: Secondary | ICD-10-CM | POA: Diagnosis present

## 2020-12-23 NOTE — Progress Notes (Addendum)
COVID Vaccine Completed: yes x2 Date COVID Vaccine completed: 07/05/19, 07/31/19 Has received booster: COVID vaccine manufacturer: Pfizer      Date of COVID positive in last 90 days: N/A  PCP - Everrett Coombe, DO Cardiologist - Crenshaw  Chest x-ray - N/a EKG - 11/03/20 Epic Stress Test - 11/12/20 Epic ECHO - 11/05/20 Epic Cardiac Cath - N/A Pacemaker/ICD device last checked: N/A Spinal Cord Stimulator: N/A  Cardiac Clearance note 11/20/20 by Olga Millers.  Sleep Study - N/A CPAP -   Fasting Blood Sugar - 110-130 Checks Blood Sugar _1_ times a day  Blood Thinner Instructions: Plavix, hold 5 days prior to procedure.  Last Dose: 12/23/20  Activity level: Can go up a flight of stairs and perform activities of daily living without stopping and without symptoms of chest pain or shortness of breath.     Anesthesia review: DM, HTN, a fib, exposure to agent orange,   Patient denies shortness of breath, fever, cough and chest pain at PAT appointment   Patient verbalized understanding of instructions that were given to them at the PAT appointment. Patient was also instructed that they will need to review over the PAT instructions again at home before surgery.

## 2020-12-23 NOTE — Patient Instructions (Addendum)
DUE TO COVID-19 ONLY ONE VISITOR IS ALLOWED TO COME WITH YOU AND STAY IN THE WAITING ROOM ONLY DURING PRE OP AND PROCEDURE.   **NO VISITORS ARE ALLOWED IN THE SHORT STAY AREA OR RECOVERY ROOM!!**  IF YOU WILL BE ADMITTED INTO THE HOSPITAL YOU ARE ALLOWED ONLY TWO SUPPORT PEOPLE DURING VISITATION HOURS ONLY (10AM -8PM)   The support person(s) may change daily. The support person(s) must pass our screening, gel in and out, and wear a mask at all times, including in the patient's room. Patients must also wear a mask when staff or their support person are in the room.  No visitors under the age of 77. Any visitor under the age of 20 must be accompanied by an adult.    COVID SWAB TESTING MUST BE COMPLETED ON:  12/24/20 @ 12:40 PM   [address] [place], [place] [address]   [address]  [place] (backside of the building) You are not required to quarantine, however you are required to wear a well-fitted mask when you are out and around people not in your household.  Hand Hygiene often Do NOT share personal items Notify your provider if you are in close contact with someone who has COVID or you develop fever 100.4 or greater, new onset of sneezing, cough, sore throat, shortness of breath or body aches.       Your procedure is scheduled on: 12/28/20   Report to [facility] Main  Entrance    Report to admitting at 11:00 AM   Call this number if you have problems the morning of surgery [phone]   Do not eat food :After Midnight.   May have liquids until 10:00 AM day of surgery  CLEAR LIQUID DIET  Foods Allowed                                                                     Foods Excluded  Water, Black Coffee and tea, regular and decaf               liquids that you cannot  Plain Jell-O in any flavor  (No red)                                    see through such as: Fruit ices (not with fruit pulp)                                            milk, soups, orange  juice              Iced Popsicles (No red)                                                All solid food  Apple juices Sports drinks like Gatorade (No red) Lightly seasoned clear broth or consume(fat free) Sugar, honey syrup    Oral Hygiene is also important to reduce your risk of infection.                                    Remember - BRUSH YOUR TEETH THE MORNING OF SURGERY WITH YOUR REGULAR TOOTHPASTE   Take these medicines the morning of surgery with A SIP OF WATER: Metoprolol, Thyroid  DO NOT TAKE ANY ORAL DIABETIC MEDICATIONS DAY OF YOUR SURGERY  How to Manage Your Diabetes Before and After Surgery  Why is it important to control my blood sugar before and after surgery? Improving blood sugar levels before and after surgery helps healing and can limit problems. A way of improving blood sugar control is eating a healthy diet by:  Eating less sugar and carbohydrates  Increasing activity/exercise  Talking with your doctor about reaching your blood sugar goals High blood sugars (greater than 180 mg/dL) can raise your risk of infections and slow your recovery, so you will need to focus on controlling your diabetes during the weeks before surgery. Make sure that the doctor who takes care of your diabetes knows about your planned surgery including the date and location.  How do I manage my blood sugar before surgery? Check your blood sugar at least 4 times a day, starting 2 days before surgery, to make sure that the level is not too high or low. Check your blood sugar the morning of your surgery when you wake up and every 2 hours until you get to the Short Stay unit. If your blood sugar is less than 70 mg/dL, you will need to treat for low blood sugar: Do not take insulin. Treat a low blood sugar (less than 70 mg/dL) with  cup of clear juice (cranberry or apple), 4 glucose tablets, OR glucose gel. Recheck blood sugar in 15 minutes after treatment (to  make sure it is greater than 70 mg/dL). If your blood sugar is not greater than 70 mg/dL on recheck, call [phone] for further instructions. Report your blood sugar to the short stay nurse when you get to Short Stay.  If you are admitted to the hospital after surgery: Your blood sugar will be checked by the staff and you will probably be given insulin after surgery (instead of oral diabetes medicines) to make sure you have good blood sugar levels. The goal for blood sugar control after surgery is 80-180 mg/dL.   WHAT DO I DO ABOUT MY DIABETES MEDICATION?  Do not take oral diabetes medicines (pills) the morning of surgery.  THE DAY BEFORE SURGERY, take Metformin as prescribed.        THE MORNING OF SURGERY, do not take Metformin.    Reviewed and Endorsed by [facility] Patient Education Committee, August 2015                               You may not have any metal on your body including hair pins, jewelry, and body piercing             Do not wear make-up, lotions, powders, perfumes, or deodorant  Do not wear nail polish including gel and S&S, artificial/acrylic nails, or any other type of covering on natural nails including finger and toenails. If you have artificial nails,  gel coating, etc. that needs to be removed by a nail salon please have this removed prior to surgery or surgery may need to be canceled/ delayed if the surgeon/ anesthesia feels like they are unable to be safely monitored.   Do not shave  48 hours prior to surgery.    Do not bring valuables to the hospital. [facility] IS NOT             RESPONSIBLE   FOR VALUABLES.   Contacts, dentures or bridgework may not be worn into surgery.   Bring small overnight bag day of surgery.   Please read over the following fact sheets you were given: IF YOU HAVE QUESTIONS ABOUT YOUR PRE OP INSTRUCTIONS PLEASE CALL [phone]-Melecio Cueto   [facility] - Preparing for Surgery Before surgery, you can play an important role.   Because skin is not sterile, your skin needs to be as free of germs as possible.  You can reduce the number of germs on your skin by washing with CHG (chlorahexidine gluconate) soap before surgery.  CHG is an antiseptic cleaner which kills germs and bonds with the skin to continue killing germs even after washing. Please DO NOT use if you have an allergy to CHG or antibacterial soaps.  If your skin becomes reddened/irritated stop using the CHG and inform your nurse when you arrive at Short Stay. Do not shave (including legs and underarms) for at least 48 hours prior to the first CHG shower.  You may shave your face/neck.  Please follow these instructions carefully:  1.  Shower with CHG Soap the night before surgery and the  morning of surgery.  2.  If you choose to wash your hair, wash your hair first as usual with your normal  shampoo.  3.  After you shampoo, rinse your hair and body thoroughly to remove the shampoo.                             4.  Use CHG as you would any other liquid soap.  You can apply chg directly to the skin and wash.  Gently with a scrungie or clean washcloth.  5.  Apply the CHG Soap to your body ONLY FROM THE NECK DOWN.   Do   not use on face/ open                           Wound or open sores. Avoid contact with eyes, ears mouth and   genitals (private parts).                       Wash face,  Genitals (private parts) with your normal soap.             6.  Wash thoroughly, paying special attention to the area where your    surgery  will be performed.  7.  Thoroughly rinse your body with warm water from the neck down.  8.  DO NOT shower/wash with your normal soap after using and rinsing off the CHG Soap.                9.  Pat yourself dry with a clean towel.            10.  Wear clean pajamas.            11.  Place clean sheets on your bed  the night of your first shower and do not  sleep with pets. Day of Surgery : Do not apply any lotions/deodorants the morning of  surgery.  Please wear clean clothes to the hospital/surgery center.  FAILURE TO FOLLOW THESE INSTRUCTIONS MAY RESULT IN THE CANCELLATION OF YOUR SURGERY  PATIENT SIGNATURE_________________________________  NURSE SIGNATURE__________________________________  ________________________________________________________________________

## 2020-12-24 ENCOUNTER — Other Ambulatory Visit (HOSPITAL_COMMUNITY)
Admission: RE | Admit: 2020-12-24 | Discharge: 2020-12-24 | Disposition: A | Discharge status: Home / Self Care | Payer: Medicare HMO | Source: Ambulatory Visit | Attending: Surgery | Admitting: Surgery

## 2020-12-24 ENCOUNTER — Encounter (HOSPITAL_COMMUNITY): Payer: Self-pay

## 2020-12-24 ENCOUNTER — Encounter (HOSPITAL_COMMUNITY)
Admission: RE | Admit: 2020-12-24 | Discharge: 2020-12-24 | Disposition: A | Discharge status: Home / Self Care | Payer: Medicare HMO | Source: Ambulatory Visit | Attending: Surgery | Admitting: Surgery

## 2020-12-24 ENCOUNTER — Other Ambulatory Visit: Payer: Self-pay

## 2020-12-24 DIAGNOSIS — Z01812 Encounter for preprocedural laboratory examination: Secondary | ICD-10-CM | POA: Diagnosis not present

## 2020-12-24 DIAGNOSIS — Z20822 Contact with and (suspected) exposure to covid-19: Secondary | ICD-10-CM | POA: Diagnosis not present

## 2020-12-24 HISTORY — DX: Hypothyroidism, unspecified: E03.9

## 2020-12-24 LAB — SARS CORONAVIRUS 2 (TAT 6-24 HRS): SARS Coronavirus 2: NEGATIVE

## 2020-12-24 LAB — GLUCOSE, CAPILLARY: Glucose-Capillary: 151 mg/dL — ABNORMAL HIGH (ref 70–99)

## 2020-12-25 NOTE — Anesthesia Preprocedure Evaluation (Addendum)
Anesthesia Evaluation  Patient identified by MRN, date of birth, ID band Patient awake    Reviewed: Allergy & Precautions, NPO status , Patient's Chart, lab work & pertinent test results  Airway Mallampati: II  TM Distance: >3 FB Neck ROM: Full  Mouth opening: Limited Mouth Opening  Dental no notable dental hx. (+) Edentulous Lower, Edentulous Upper   Pulmonary former smoker,    Pulmonary exam normal breath sounds clear to auscultation       Cardiovascular Exercise Tolerance: Good hypertension, Pt. on medications + Past MI and + Peripheral Vascular Disease  Normal cardiovascular exam+ dysrhythmias Atrial Fibrillation  Rhythm:Regular Rate:Normal  Stress Test 11/12/2020  ? Nuclear stress EF: 69%. ? The left ventricular ejection fraction is hyperdynamic (>65%). ? There was no ST segment deviation noted during stress. ? Defect 1: There is a medium defect of severe severity present in the apical septal, apical inferior, apical lateral and apex location. ? Findings consistent with prior myocardial infarction. ? This is an intermediate risk study.  There is a medium size, fixed perfusion defect at the apical cap (segment 17) and in the apical septum, inferoapex and lateral apex: There is absent fixed perfusion abnormality in the apical cap, apical septum, and lateral apex. There is severe fixed perfusion defect in the inferoapex. Normal mid and basal perfusion.  Findings consistent with prior apical infarct. Wall motion abnormalities at apex.  Intermediate risk study due to prior infarct.    Neuro/Psych negative psych ROS   GI/Hepatic negative GI ROS, Neg liver ROS,   Endo/Other  diabetes, Type 2Hypothyroidism   Renal/GU Lab Results      Component                Value               Date                      CREATININE               0.69                12/02/2020                BUN                      18                   12/02/2020                NA                       138                 12/02/2020                K                        3.9                 12/02/2020                CL                       112 (H)             12/02/2020  CO2                      22                  12/02/2020                Musculoskeletal negative musculoskeletal ROS (+)   Abdominal   Peds  Hematology  (+) anemia , Lab Results      Component                Value               Date                      WBC                      7.6                 12/02/2020                HGB                      10.5 (L)            12/02/2020                HCT                      32.0 (L)            12/02/2020                MCV                      87.9                12/02/2020                PLT                      221                 12/02/2020              Anesthesia Other Findings   Reproductive/Obstetrics                           Anesthesia Physical Anesthesia Plan  ASA: 3  Anesthesia Plan: General   Post-op Pain Management:    Induction: Intravenous  PONV Risk Score and Plan: Treatment may vary due to age or medical condition, Ondansetron, Midazolam and Dexamethasone  Airway Management Planned: Oral ETT  Additional Equipment: None  Intra-op Plan:   Post-operative Plan: Extubation in OR  Informed Consent: I have reviewed the patients History and Physical, chart, labs and discussed the procedure including the risks, benefits and alternatives for the proposed anesthesia with the patient or authorized representative who has indicated his/her understanding and acceptance.     Dental advisory given  Plan Discussed with: CRNA  Anesthesia Plan Comments: (See PAT note 12/24/20, Jodell Cipro, PA-C)      Anesthesia Quick Evaluation

## 2020-12-25 NOTE — Progress Notes (Signed)
Called and updated patient to be here at 1030 on Monday.   Pt. Aware

## 2020-12-25 NOTE — Progress Notes (Signed)
Anesthesia Chart Review   Case: [id number] Date/Time: 12/28/20 1245   Procedure: LAPAROSCOPIC VENTRAL INCISIONAL HERNIA REPAIR WITH MESH   Anesthesia type: General   Pre-op diagnosis: VENTRAL INCISIONAL HERNIA   Location: [facility] ROOM 07 / [facility]   Surgeons: Gerkin, Todd, MD       DISCUSSION:75 y.o. former smoker with h/o HTN, DM II, atrial fibrillation, CKD Stage III, ventral incisional hernia scheduled for above procedure 12/28/2020 with Dr. Todd Gerkin.   Pt last seen by cardiology 11/20/2020. Per OV note, "preoperative evaluation prior to ventral hernia repair-I have personally reviewed patients nuclear study and echo; there is prior infarct on nuclear study and apical wall motion on echo with preserved LV function.  However there is no ischemia noted in the remaining vascular territories and she is not having chest pain.  I think she can proceed with ventral hernia repair (given recent incarcerated hernia I would like to not delay this).  Would continue present medications.  Postoperatively.  Hold Plavix 5 days prior to procedure."  Anticipate pt can proceed with planned procedure barring acute status change.   VS: BP (!) 155/72   Pulse 70   Temp 36.6 C (Oral)   Resp 16   Ht 5' 4" (1.626 m)   Wt 67.5 kg   SpO2 100%   BMI 25.54 kg/m   PROVIDERS: Matthews, Cody, DO is PCP   Crenshaw, Brian, MD is Cardiologist  LABS: Labs reviewed: Acceptable for surgery. (all labs ordered are listed, but only abnormal results are displayed)  Labs Reviewed  GLUCOSE, CAPILLARY - Abnormal; Notable for the following components:      Result Value   Glucose-Capillary 151 (*)    All other components within normal limits     IMAGES:   EKG: 11/03/2020 Rate 88 bpm  Sinus rhythm  Ventricular bigeminy Low voltage, precordial leads Minimal ST elevation, inferior leads  CV: Stress Test 11/12/2020  Nuclear stress EF: 69%. The left ventricular ejection fraction is hyperdynamic (>65%). There was no  ST segment deviation noted during stress. Defect 1: There is a medium defect of severe severity present in the apical septal, apical inferior, apical lateral and apex location. Findings consistent with prior myocardial infarction. This is an intermediate risk study.   There is a medium size, fixed perfusion defect at the apical cap (segment 17) and in the apical septum, inferoapex and lateral apex: There is absent fixed perfusion abnormality in the apical cap, apical septum, and lateral apex. There is severe fixed perfusion defect in the inferoapex. Normal mid and basal perfusion.   Findings consistent with prior apical infarct. Wall motion abnormalities at apex.   Intermediate risk study due to prior infarct.  Echo 11/05/2020  1. Left ventricular ejection fraction, by estimation, is 65 to 70%. The  left ventricle has normal function. The left ventricle has no regional  wall motion abnormalities. There is mild left ventricular hypertrophy of  the basal-septal segment. Left  ventricular diastolic parameters are consistent with Grade I diastolic  dysfunction (impaired relaxation).   2. Right ventricular systolic function is normal. The right ventricular  size is normal. There is normal pulmonary artery systolic pressure.   3. The mitral valve is normal in structure. No evidence of mitral valve  regurgitation. No evidence of mitral stenosis.   4. The aortic valve is normal in structure. Aortic valve regurgitation is  not visualized. No aortic stenosis is present.   5. The inferior vena cava is normal in size with greater   than 50%  respiratory variability, suggesting right atrial pressure of 3 mmHg.  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    MEDICATIONS:  Alcohol Swabs (B-D SINGLE USE SWABS REGULAR) PADS   Blood Glucose Monitoring Suppl (TRUE METRIX METER) w/Device KIT   clopidogrel (PLAVIX) 75 MG tablet   glucose blood (TRUE METRIX BLOOD GLUCOSE TEST) test strip   loperamide (IMODIUM A-D) 2 MG tablet   losartan (COZAAR) 50 MG tablet   metFORMIN (GLUCOPHAGE) 500 MG tablet   metoprolol tartrate (LOPRESSOR) 25 MG tablet   NP THYROID 90 MG tablet   PAPAYA ENZYME PO   Probiotic Product (ALIGN) 4 MG CAPS   rosuvastatin (CRESTOR) 40 MG tablet   saccharomyces boulardii (FLORASTOR) 250 MG capsule   TRUEplus Lancets 33G MISC   No current facility-administered medications for this encounter.     Jessica Zanetto, PA-C [facility]-Surgical Testing [phone]     

## 2020-12-27 MED ORDER — SODIUM CHLORIDE 0.9 % IV SOLN
1.0000 g | INTRAVENOUS | Status: AC
  Administered 2020-12-28: 1 g via INTRAVENOUS
  Filled 2020-12-27 (×2): qty 1

## 2020-12-28 ENCOUNTER — Ambulatory Visit (HOSPITAL_COMMUNITY)
Admission: RE | Admit: 2020-12-28 | Discharge: 2020-12-29 | Disposition: A | Discharge status: Home / Self Care | Payer: Medicare HMO | Attending: Surgery | Admitting: Surgery

## 2020-12-28 ENCOUNTER — Encounter (HOSPITAL_COMMUNITY): Payer: Self-pay | Admitting: Surgery

## 2020-12-28 ENCOUNTER — Encounter (HOSPITAL_COMMUNITY)
Admission: RE | Disposition: A | Discharge status: Home / Self Care | Payer: Self-pay | Source: Home / Self Care | Attending: Surgery

## 2020-12-28 ENCOUNTER — Other Ambulatory Visit: Payer: Self-pay

## 2020-12-28 ENCOUNTER — Ambulatory Visit (HOSPITAL_COMMUNITY): Payer: Medicare HMO | Admitting: Physician Assistant

## 2020-12-28 ENCOUNTER — Ambulatory Visit (HOSPITAL_COMMUNITY): Payer: Medicare HMO | Admitting: Anesthesiology

## 2020-12-28 DIAGNOSIS — Z794 Long term (current) use of insulin: Secondary | ICD-10-CM | POA: Insufficient documentation

## 2020-12-28 DIAGNOSIS — E1152 Type 2 diabetes mellitus with diabetic peripheral angiopathy with gangrene: Secondary | ICD-10-CM | POA: Insufficient documentation

## 2020-12-28 DIAGNOSIS — K432 Incisional hernia without obstruction or gangrene: Secondary | ICD-10-CM | POA: Diagnosis not present

## 2020-12-28 DIAGNOSIS — I96 Gangrene, not elsewhere classified: Secondary | ICD-10-CM | POA: Insufficient documentation

## 2020-12-28 DIAGNOSIS — E039 Hypothyroidism, unspecified: Secondary | ICD-10-CM | POA: Insufficient documentation

## 2020-12-28 DIAGNOSIS — I480 Paroxysmal atrial fibrillation: Secondary | ICD-10-CM | POA: Insufficient documentation

## 2020-12-28 DIAGNOSIS — K430 Incisional hernia with obstruction, without gangrene: Secondary | ICD-10-CM | POA: Diagnosis not present

## 2020-12-28 DIAGNOSIS — I10 Essential (primary) hypertension: Secondary | ICD-10-CM | POA: Diagnosis not present

## 2020-12-28 DIAGNOSIS — Z87891 Personal history of nicotine dependence: Secondary | ICD-10-CM | POA: Insufficient documentation

## 2020-12-28 DIAGNOSIS — K436 Other and unspecified ventral hernia with obstruction, without gangrene: Secondary | ICD-10-CM | POA: Insufficient documentation

## 2020-12-28 HISTORY — PX: VENTRAL HERNIA REPAIR: SHX424

## 2020-12-28 LAB — GLUCOSE, CAPILLARY
Glucose-Capillary: 158 mg/dL — ABNORMAL HIGH (ref 70–99)
Glucose-Capillary: 190 mg/dL — ABNORMAL HIGH (ref 70–99)
Glucose-Capillary: 191 mg/dL — ABNORMAL HIGH (ref 70–99)
Glucose-Capillary: 188 mg/dL — ABNORMAL HIGH (ref 70–99)

## 2020-12-28 SURGERY — REPAIR, HERNIA, VENTRAL, LAPAROSCOPIC
Anesthesia: General

## 2020-12-28 MED ORDER — DEXAMETHASONE SODIUM PHOSPHATE 10 MG/ML IJ SOLN
INTRAMUSCULAR | Status: AC
  Filled 2020-12-28: qty 1

## 2020-12-28 MED ORDER — ONDANSETRON HCL 4 MG/2ML IJ SOLN: 4.0000 mg | Freq: Once | INTRAMUSCULAR | Status: DC | PRN

## 2020-12-28 MED ORDER — BUPIVACAINE-EPINEPHRINE 0.25% -1:200000 IJ SOLN
INTRAMUSCULAR | Status: DC | PRN
  Administered 2020-12-28: 20 mL

## 2020-12-28 MED ORDER — AMISULPRIDE (ANTIEMETIC) 5 MG/2ML IV SOLN: 10.0000 mg | Freq: Once | INTRAVENOUS | Status: DC | PRN

## 2020-12-28 MED ORDER — METOPROLOL TARTRATE 25 MG PO TABS
25.0000 mg | ORAL_TABLET | Freq: Two times a day (BID) | ORAL | Status: DC
  Administered 2020-12-29: 25 mg via ORAL
  Filled 2020-12-28 (×2): qty 1

## 2020-12-28 MED ORDER — ONDANSETRON HCL 4 MG/2ML IJ SOLN: 4.0000 mg | Freq: Four times a day (QID) | INTRAMUSCULAR | Status: DC | PRN

## 2020-12-28 MED ORDER — LACTATED RINGERS IV SOLN: INTRAVENOUS | Status: DC

## 2020-12-28 MED ORDER — BUPIVACAINE-EPINEPHRINE (PF) 0.25% -1:200000 IJ SOLN
INTRAMUSCULAR | Status: AC
  Filled 2020-12-28: qty 30

## 2020-12-28 MED ORDER — HYDROMORPHONE HCL 1 MG/ML IJ SOLN
0.2500 mg | INTRAMUSCULAR | Status: DC | PRN
  Administered 2020-12-28: 0.5 mg via INTRAVENOUS

## 2020-12-28 MED ORDER — PHENYLEPHRINE HCL (PRESSORS) 10 MG/ML IV SOLN
INTRAVENOUS | Status: DC | PRN
  Administered 2020-12-28: 80 ug via INTRAVENOUS

## 2020-12-28 MED ORDER — SODIUM CHLORIDE 0.45 % IV SOLN: INTRAVENOUS | Status: DC

## 2020-12-28 MED ORDER — HYDROMORPHONE HCL 1 MG/ML IJ SOLN
INTRAMUSCULAR | Status: AC
  Filled 2020-12-28: qty 1

## 2020-12-28 MED ORDER — OXYCODONE HCL 5 MG PO TABS: 5.0000 mg | ORAL_TABLET | Freq: Once | ORAL | Status: DC | PRN

## 2020-12-28 MED ORDER — ONDANSETRON HCL 4 MG/2ML IJ SOLN: INTRAMUSCULAR | Status: DC | PRN

## 2020-12-28 MED ORDER — FENTANYL CITRATE (PF) 100 MCG/2ML IJ SOLN
INTRAMUSCULAR | Status: AC
  Filled 2020-12-28: qty 2

## 2020-12-28 MED ORDER — METFORMIN HCL 500 MG PO TABS
1000.0000 mg | ORAL_TABLET | Freq: Two times a day (BID) | ORAL | Status: DC
  Administered 2020-12-29: 1000 mg via ORAL
  Filled 2020-12-28: qty 2

## 2020-12-28 MED ORDER — DEXAMETHASONE SODIUM PHOSPHATE 10 MG/ML IJ SOLN
INTRAMUSCULAR | Status: DC | PRN
  Administered 2020-12-28: 5 mg via INTRAVENOUS

## 2020-12-28 MED ORDER — OXYCODONE HCL 5 MG/5ML PO SOLN: 5.0000 mg | Freq: Once | ORAL | Status: DC | PRN

## 2020-12-28 MED ORDER — FENTANYL CITRATE (PF) 100 MCG/2ML IJ SOLN
INTRAMUSCULAR | Status: DC | PRN
  Administered 2020-12-28 (×2): 50 ug via INTRAVENOUS

## 2020-12-28 MED ORDER — TRAMADOL HCL 50 MG PO TABS
50.0000 mg | ORAL_TABLET | Freq: Four times a day (QID) | ORAL | Status: DC | PRN
  Administered 2020-12-29: 50 mg via ORAL
  Filled 2020-12-28: qty 1

## 2020-12-28 MED ORDER — LIDOCAINE HCL (CARDIAC) PF 100 MG/5ML IV SOSY
PREFILLED_SYRINGE | INTRAVENOUS | Status: DC | PRN
Start: 2020-12-28 — End: 2020-12-28
  Administered 2020-12-28: 100 mg via INTRAVENOUS

## 2020-12-28 MED ORDER — CHLORHEXIDINE GLUCONATE CLOTH 2 % EX PADS: 6.0000 | MEDICATED_PAD | Freq: Once | CUTANEOUS | Status: DC

## 2020-12-28 MED ORDER — ORAL CARE MOUTH RINSE: 15.0000 mL | Freq: Once | OROMUCOSAL | Status: AC

## 2020-12-28 MED ORDER — ONDANSETRON 4 MG PO TBDP: 4.0000 mg | ORAL_TABLET | Freq: Four times a day (QID) | ORAL | Status: DC | PRN

## 2020-12-28 MED ORDER — ACETAMINOPHEN 650 MG RE SUPP: 650.0000 mg | Freq: Four times a day (QID) | RECTAL | Status: DC | PRN

## 2020-12-28 MED ORDER — ONDANSETRON HCL 4 MG/2ML IJ SOLN
INTRAMUSCULAR | Status: DC | PRN
  Administered 2020-12-28: 4 mg via INTRAVENOUS

## 2020-12-28 MED ORDER — LOSARTAN POTASSIUM 50 MG PO TABS
50.0000 mg | ORAL_TABLET | Freq: Every day | ORAL | Status: DC
  Administered 2020-12-29: 50 mg via ORAL
  Filled 2020-12-28: qty 1

## 2020-12-28 MED ORDER — PHENYLEPHRINE HCL (PRESSORS) 10 MG/ML IV SOLN
INTRAVENOUS | Status: AC
  Filled 2020-12-28: qty 1

## 2020-12-28 MED ORDER — ROCURONIUM BROMIDE 10 MG/ML (PF) SYRINGE
PREFILLED_SYRINGE | INTRAVENOUS | Status: AC
  Filled 2020-12-28: qty 10

## 2020-12-28 MED ORDER — SUGAMMADEX SODIUM 200 MG/2ML IV SOLN
INTRAVENOUS | Status: DC | PRN
  Administered 2020-12-28: 200 mg via INTRAVENOUS

## 2020-12-28 MED ORDER — PHENYLEPHRINE HCL-NACL 10-0.9 MG/250ML-% IV SOLN
INTRAVENOUS | Status: DC | PRN
Start: 2020-12-28 — End: 2020-12-28
  Administered 2020-12-28: 40 ug/min via INTRAVENOUS

## 2020-12-28 MED ORDER — HYDROMORPHONE HCL 1 MG/ML IJ SOLN
1.0000 mg | INTRAMUSCULAR | Status: DC | PRN
Start: 2020-12-28 — End: 2020-12-29
  Filled 2020-12-28: qty 1

## 2020-12-28 MED ORDER — LACTATED RINGERS IR SOLN
Status: DC | PRN
  Administered 2020-12-28: 1000 mL

## 2020-12-28 MED ORDER — ROCURONIUM BROMIDE 100 MG/10ML IV SOLN
INTRAVENOUS | Status: DC | PRN
  Administered 2020-12-28: 10 mg via INTRAVENOUS
  Administered 2020-12-28: 40 mg via INTRAVENOUS

## 2020-12-28 MED ORDER — PROPOFOL 10 MG/ML IV BOLUS
INTRAVENOUS | Status: DC | PRN
Start: 2020-12-28 — End: 2020-12-28
  Administered 2020-12-28: 90 mg via INTRAVENOUS

## 2020-12-28 MED ORDER — ONDANSETRON HCL 4 MG/2ML IJ SOLN
INTRAMUSCULAR | Status: AC
  Filled 2020-12-28: qty 2

## 2020-12-28 MED ORDER — CHLORHEXIDINE GLUCONATE 0.12 % MT SOLN
15.0000 mL | Freq: Once | OROMUCOSAL | Status: AC
  Administered 2020-12-28: 15 mL via OROMUCOSAL

## 2020-12-28 MED ORDER — INSULIN ASPART 100 UNIT/ML IJ SOLN
0.0000 [IU] | Freq: Three times a day (TID) | INTRAMUSCULAR | Status: DC
  Administered 2020-12-29: 2 [IU] via SUBCUTANEOUS

## 2020-12-28 MED ORDER — LIDOCAINE 2% (20 MG/ML) 5 ML SYRINGE
INTRAMUSCULAR | Status: AC
  Filled 2020-12-28: qty 5

## 2020-12-28 MED ORDER — ACETAMINOPHEN 10 MG/ML IV SOLN: 1000.0000 mg | Freq: Once | INTRAVENOUS | Status: DC | PRN

## 2020-12-28 MED ORDER — EPHEDRINE SULFATE 50 MG/ML IJ SOLN
INTRAMUSCULAR | Status: DC | PRN
  Administered 2020-12-28 (×2): 10 mg via INTRAVENOUS

## 2020-12-28 MED ORDER — ACETAMINOPHEN 325 MG PO TABS: 650.0000 mg | ORAL_TABLET | Freq: Four times a day (QID) | ORAL | Status: DC | PRN

## 2020-12-28 MED ORDER — OXYCODONE HCL 5 MG PO TABS: 5.0000 mg | ORAL_TABLET | ORAL | Status: DC | PRN

## 2020-12-28 SURGICAL SUPPLY — 40 items
BAG COUNTER SPONGE SURGICOUNT (BAG) IMPLANT
BINDER ABDOMINAL 12 ML 46-62 (SOFTGOODS) ×2 IMPLANT
CHLORAPREP W/TINT 26 (MISCELLANEOUS) ×2 IMPLANT
COVER SURGICAL LIGHT HANDLE (MISCELLANEOUS) ×2 IMPLANT
DECANTER SPIKE VIAL GLASS SM (MISCELLANEOUS) ×2 IMPLANT
DERMABOND ADVANCED (GAUZE/BANDAGES/DRESSINGS) ×1
DERMABOND ADVANCED .7 DNX12 (GAUZE/BANDAGES/DRESSINGS) ×1 IMPLANT
DEVICE SECURE STRAP 25 ABSORB (INSTRUMENTS) ×4 IMPLANT
DEVICE TROCAR PUNCTURE CLOSURE (ENDOMECHANICALS) ×2 IMPLANT
DRAPE INCISE IOBAN 66X45 STRL (DRAPES) IMPLANT
DRAPE UTILITY XL STRL (DRAPES) ×2 IMPLANT
ELECT REM PT RETURN 15FT ADLT (MISCELLANEOUS) ×2 IMPLANT
GLOVE SURG ORTHO LTX SZ8 (GLOVE) ×2 IMPLANT
GOWN STRL REUS W/TWL XL LVL3 (GOWN DISPOSABLE) ×4 IMPLANT
KIT BASIN OR (CUSTOM PROCEDURE TRAY) ×2 IMPLANT
KIT TURNOVER KIT A (KITS) ×2 IMPLANT
MARKER SKIN DUAL TIP RULER LAB (MISCELLANEOUS) ×2 IMPLANT
MESH VENTRALIGHT ST 4X6IN (Mesh General) ×2 IMPLANT
NEEDLE SPNL 22GX3.5 QUINCKE BK (NEEDLE) ×2 IMPLANT
PAD POSITIONING PINK XL (MISCELLANEOUS) IMPLANT
PENCIL SMOKE EVACUATOR (MISCELLANEOUS) ×2 IMPLANT
PROTECTOR NERVE ULNAR (MISCELLANEOUS) IMPLANT
SCISSORS LAP 5X35 DISP (ENDOMECHANICALS) IMPLANT
SET IRRIG TUBING LAPAROSCOPIC (IRRIGATION / IRRIGATOR) ×2 IMPLANT
SET TUBE SMOKE EVAC HIGH FLOW (TUBING) ×2 IMPLANT
SHEARS HARMONIC ACE PLUS 36CM (ENDOMECHANICALS) ×2 IMPLANT
SLEEVE XCEL OPT CAN 5 100 (ENDOMECHANICALS) ×4 IMPLANT
SOL ANTI FOG 6CC (MISCELLANEOUS) ×1 IMPLANT
SOLUTION ANTI FOG 6CC (MISCELLANEOUS) ×1
STRIP CLOSURE SKIN 1/2X4 (GAUZE/BANDAGES/DRESSINGS) ×4 IMPLANT
SUT MNCRL AB 4-0 PS2 18 (SUTURE) ×2 IMPLANT
SUT NOVA NAB DX-16 0-1 5-0 T12 (SUTURE) ×4 IMPLANT
TACKER 5MM HERNIA 3.5CML NAB (ENDOMECHANICALS) IMPLANT
TAPE CLOTH 4X10 WHT NS (GAUZE/BANDAGES/DRESSINGS) IMPLANT
TOWEL OR 17X26 10 PK STRL BLUE (TOWEL DISPOSABLE) ×2 IMPLANT
TOWEL OR NON WOVEN STRL DISP B (DISPOSABLE) ×2 IMPLANT
TRAY FOLEY MTR SLVR 16FR STAT (SET/KITS/TRAYS/PACK) ×2 IMPLANT
TRAY LAPAROSCOPIC (CUSTOM PROCEDURE TRAY) ×2 IMPLANT
TROCAR BLADELESS OPT 5 100 (ENDOMECHANICALS) ×2 IMPLANT
TROCAR XCEL NON-BLD 11X100MML (ENDOMECHANICALS) ×2 IMPLANT

## 2020-12-28 NOTE — Anesthesia Postprocedure Evaluation (Signed)
Anesthesia Post Note  Patient: Erin Mitchell [facility]  Procedure(s) Performed: LAPAROSCOPIC VENTRAL INCISIONAL HERNIA REPAIR WITH MESH     Patient location during evaluation: PACU Anesthesia Type: General Level of consciousness: awake and alert, patient cooperative and oriented Pain management: pain level controlled Vital Signs Assessment: post-procedure vital signs reviewed and stable Respiratory status: spontaneous breathing, nonlabored ventilation and respiratory function stable Cardiovascular status: blood pressure returned to baseline and stable Postop Assessment: no apparent nausea or vomiting Anesthetic complications: no   No notable events documented.  Last Vitals:  Vitals:   12/28/20 1115 12/28/20 1515  BP: (!) 188/92   Pulse:  80  Resp: 16   Temp:  37 C  SpO2:      Last Pain:  Vitals:   12/28/20 1515  TempSrc:   PainSc: 0-No pain                 Donelle Baba,E. Briannon Boggio

## 2020-12-28 NOTE — Interval H&P Note (Signed)
History and Physical Interval Note:  12/28/2020 12:29 PM  Erin Mitchell  has presented today for surgery, with the diagnosis of VENTRAL INCISIONAL HERNIA.  The various methods of treatment have been discussed with the patient and family. After consideration of risks, benefits and other options for treatment, the patient has consented to    Procedure(s): LAPAROSCOPIC VENTRAL INCISIONAL HERNIA REPAIR WITH MESH (N/A) as a surgical intervention.    The patient's history has been reviewed, patient examined, no change in status, stable for surgery.  I have reviewed the patient's chart and labs.  Questions were answered to the patient's satisfaction.    Darnell Level, MD [facility] Office: [phone]   Darnell Level

## 2020-12-28 NOTE — Op Note (Signed)
Operative Note  Pre-operative Diagnosis:  Ventral incisional hernia  Post-operative Diagnosis:  same  Surgeon:  Darnell Level, MD  Assistant:  none   Procedure:  laparoscopic ventral incisional hernia repair with mesh  Anesthesia:  general  Estimated Blood Loss:  minimal  Drains: none         Specimen: none  Indications:  Patient is a 75 year old female known to me from the hospital general surgery service when I saw her in consultation in early June 2022.  Patient had presented to the hospital with small bowel obstruction due to a ventral incisional hernia which was successfully reduced.  She also had presented with a gastrointestinal illness with diarrhea which has now resolved.  Patient was also evaluated by cardiology, Dr. Olga Millers, and cleared for surgical intervention.  Patient has had a history of cesarean section, open appendectomy, and laparoscopic cholecystectomy.  These multiple procedures have resulted in a ventral incisional hernia in the mid abdomen.  This is now reducible but spontaneously prolapses when the patient is in a standing position.  She presents today to discuss proceeding with ventral hernia repair.    Procedure:  The patient was seen in the pre-op holding area. The risks, benefits, complications, treatment options, and expected outcomes were previously discussed with the patient. The patient agreed with the proposed plan and has signed the informed consent form.  The patient was brought to the operating room by the surgical team, identified as Al Corpus and the procedure verified. A "time out" was completed and the above information confirmed.  Following induction of general anesthesia, the patient is positioned and then prepped and draped in the usual aseptic fashion.  After ascertaining that an adequate level of anesthesia been achieved, an incision is made at the left costal margin.  Using a 5 mm Optiview trocar the peritoneal cavity is  accessed under direct vision.  Pneumoperitoneum is established.  Additional trochars are placed in the left lower quadrant, right lower quadrant, and right upper quadrant under direct vision.  There are adhesions of the omentum to the anterior abdominal wall.  Evaluation shows a small round umbilical hernia which is fully reduced.  The omentum is incarcerated in an incisional hernia approximately 3 cm below the umbilical defect in the midline.  Using the harmonic scalpel the omentum was freed from the fascial edges and the contents of the hernia are delivered back into the peritoneal cavity.  Good hemostasis is noted.  The incisional hernia defect measures approximately 2 cm in diameter.  It is approximately 3 cm inferior to the umbilical defect in the midline.  After measurement, a 10 cm x 15 cm Ventralex ST mesh is selected.  It is prepared with 8 equally spaced #1 Novafil sutures placed around the periphery.  The mesh was then moistened, rolled, and inserted through the left lower quadrant 10 mm trocar into the peritoneal cavity where it is deployed and properly oriented.  Incisions are made at the site of the sutures on the anterior abdominal wall with a #11 blade.  Using an Endo Catch the suture tags are captured and brought through the abdominal wall in the usual fashion and secured with hemostats.  All 8 sutures are brought through the anterior abdominal wall.  Sutures are then pulled taut elevating the mesh up against the anterior abdominal wall with broad overlap around the 2 fascial defects.  All sutures were then tied securely and the suture tags are excised.  Next a secure strap device is  used to place the affixing staples around the edges of the mesh circumferentially.  A second concentric row of staples is placed medially on the mesh.  There is good approximation of the mesh to the anterior abdominal wall without redundancy.  Good hemostasis is noted.  Abdomen is inspected with the  laparoscope for good hemostasis.  The omentum was spread over the anterior surface of the small intestine.  Pneumoperitoneum is released under direct vision and evacuated.  There is good approximation of the mesh to the anterior abdominal wall with broad coverage beyond the fascial defects.  Ports are removed under direct vision.  Good hemostasis is noted.  Port sites are anesthetized with local anesthetic.  Wounds at the port sites are closed with interrupted 4-0 Monocryl subcuticular sutures.  Skin is cleansed with saline and Dermabond is applied to all wounds.  Patient is placed in an abdominal binder upon moving off of the operating room table and onto the stretcher.  The patient tolerated the procedure well.  Patient is transported back to the recovery room in stable condition.   Darnell Level, MD [facility] Office: [phone]

## 2020-12-28 NOTE — H&P (Signed)
General Surgery [facility]  REFERRING PHYSICIAN:  Unknown   PROVIDER:  Chevon Laufer Myra Rude, MD   MRN: [id number] DOB: June 10, 1945 DATE OF ENCOUNTER: 12/17/2020   Subjective    Chief Complaint: No chief complaint on file.       History of Present Illness:   Patient is a 75 year old female known to me from the hospital general surgery service when I saw her in consultation in early June 2022.  Patient had presented to the hospital with small bowel obstruction due to a ventral incisional hernia which was successfully reduced.  She also had presented with a gastrointestinal illness with diarrhea which has now resolved.  Patient was also evaluated by cardiology, Dr. Olga Millers, and cleared for surgical intervention.   Patient has had a history of cesarean section, open appendectomy, and laparoscopic cholecystectomy.  These multiple procedures have resulted in a ventral incisional hernia in the mid abdomen.  This is now reducible but spontaneously prolapses when the patient is in a standing position.  She presents today to discuss proceeding with ventral hernia repair.  She is accompanied by her husband.     Review of Systems: A complete review of systems was obtained from the patient.  I have reviewed this information and discussed as appropriate with the patient.  See HPI as well for other ROS.   Review of Systems  Constitutional: Negative.   HENT: Negative.   Eyes: Negative.   Respiratory: Negative.   Cardiovascular: Negative.   Gastrointestinal: Positive for abdominal pain and nausea.  Genitourinary: Negative.   Musculoskeletal: Negative.   Skin: Negative.   Neurological: Negative.   Endo/Heme/Allergies: Negative.   Psychiatric/Behavioral: Negative.         Medical History: Past Medical History  No past medical history on file.        Patient Active Problem List  Diagnosis   Abdominal pain   Acquired hypothyroidism, unspecified   Acute cystitis    Agent orange exposure   AKI (acute kidney injury) (CMS-HCC)   Diabetes (CMS-HCC)   History of amputation of toe (CMS-HCC)   Hypertension   Incarcerated ventral hernia   Leukocytosis   Nausea   PAD (peripheral artery disease) (CMS-HCC)   PAF (paroxysmal atrial fibrillation) (CMS-HCC)   SBO (small bowel obstruction) (CMS-HCC)   Type 2 diabetes mellitus with diabetic peripheral angiopathy and gangrene, without long-term current use of insulin (CMS-HCC)   Urinary frequency   Incisional hernia without obstruction or gangrene      Past Surgical History  No past surgical history on file.      Allergies  Not on File     No current outpatient medications on file prior to visit.    No current facility-administered medications on file prior to visit.      Family History  No family history on file.      Social History       Tobacco Use  Smoking Status Not on file  Smokeless Tobacco Not on file      Social History  Social History        Socioeconomic History   Marital status: Married        Objective:      There were no vitals filed for this visit.  There is no height or weight on file to calculate BMI.   Physical Exam    GENERAL APPEARANCE Development: normal Nutritional status: normal Gross deformities: none   SKIN Rash, lesions, ulcers: none Induration, erythema: none Nodules:  none palpable   EYES Conjunctiva and lids: normal Pupils: equal and reactive Iris: normal bilaterally   EARS, NOSE, MOUTH, THROAT External ears: no lesion or deformity External nose: no lesion or deformity Hearing: grossly normal Due to Covid-19 pandemic, patient is wearing a mask.   NECK Symmetric: yes Trachea: midline Thyroid: no palpable nodules in the thyroid bed   CHEST Respiratory effort: normal Retraction or accessory muscle use: no Breath sounds: normal bilaterally Rales, rhonchi, wheeze: none   CARDIOVASCULAR Auscultation: regular rhythm, normal  rate Murmurs: none Pulses: radial pulse 2+ palpable Lower extremity edema: none   ABDOMEN Distension: none Masses: none palpable Tenderness: none Hepatosplenomegaly: not present Hernia: With Valsalva there is an obvious complex hernia centered around the umbilicus, soft, reducible.  It is difficult to define the margins of the fascial defect and there may indeed be multiple defects present.  There is no tenderness.   MUSCULOSKELETAL Station and gait: normal Digits and nails: no clubbing or cyanosis Muscle strength: grossly normal all extremities Range of motion: grossly normal all extremities Deformity: History of toe amputations right foot   LYMPHATIC Cervical: none palpable Supraclavicular: none palpable   PSYCHIATRIC Oriented to person, place, and time: yes Mood and affect: normal for situation Judgment and insight: appropriate for situation       Assessment and Plan:  Diagnoses and all orders for this visit:   Incisional hernia without obstruction or gangrene     Patient presents today accompanied by her husband to discuss proceeding with ventral incisional hernia repair using mesh by laparoscopic technique.  We discussed the procedure.  We discussed the use of prosthetic mesh.  We discussed the hospital stay to be anticipated.  We discussed the size and location of the surgical incisions.  We discussed restrictions on her activities after the procedure and the use of an abdominal binder.  We discussed her recovery and return to normal activities.  The patient understands and wishes to proceed with surgery in the near future.   The risks and benefits of the procedure have been discussed at length with the patient.  The patient understands the proposed procedure, potential alternative treatments, and the course of recovery to be expected.  All of the patient's questions have been answered at this time.  The patient wishes to proceed with surgery.   Patient has been cleared  by cardiology.  We will ask her to hold her Plavix for 5 days prior to the surgical procedure.   Scheduling will contact the patient to pick out an OR date and to arrange for her preoperative clinic visit.   Darnell Level, MD [facility] Office: [phone]

## 2020-12-28 NOTE — Transfer of Care (Signed)
Immediate Anesthesia Transfer of Care Note  Patient: Laylonie Marzec [facility]  Procedure(s) Performed: LAPAROSCOPIC VENTRAL INCISIONAL HERNIA REPAIR WITH MESH  Patient Location: PACU  Anesthesia Type:General  Level of Consciousness: awake and patient cooperative  Airway & Oxygen Therapy: Patient Spontanous Breathing and Patient connected to face mask oxygen  Post-op Assessment: Report given to RN and Post -op Vital signs reviewed and stable  Post vital signs: Reviewed and stable  Last Vitals:  Vitals Value Taken Time  BP 167/67 12/28/20 1506  Temp    Pulse 40 12/28/20 1508  Resp    SpO2 100 % 12/28/20 1508  Vitals shown include unvalidated device data.  Last Pain:  Vitals:   12/28/20 1102  TempSrc:   PainSc: 0-No pain         Complications: No notable events documented.

## 2020-12-28 NOTE — Anesthesia Procedure Notes (Signed)
Procedure Name: Intubation Date/Time: 12/28/2020 1:10 PM Performed by: Jari Pigg, CRNA Pre-anesthesia Checklist: Patient identified, Emergency Drugs available, Suction available and Patient being monitored Patient Re-evaluated:Patient Re-evaluated prior to induction Oxygen Delivery Method: Circle system utilized Preoxygenation: Pre-oxygenation with 100% oxygen Induction Type: IV induction Ventilation: Mask ventilation without difficulty Laryngoscope Size: Mac and 4 Grade View: Grade I Tube type: Oral Number of attempts: 1 Airway Equipment and Method: Stylet and Oral airway Placement Confirmation: ETT inserted through vocal cords under direct vision, positive ETCO2 and breath sounds checked- equal and bilateral Secured at: 22 cm Tube secured with: Tape Dental Injury: Teeth and Oropharynx as per pre-operative assessment

## 2020-12-29 ENCOUNTER — Encounter (HOSPITAL_COMMUNITY): Payer: Self-pay | Admitting: Surgery

## 2020-12-29 DIAGNOSIS — E1152 Type 2 diabetes mellitus with diabetic peripheral angiopathy with gangrene: Secondary | ICD-10-CM | POA: Diagnosis not present

## 2020-12-29 DIAGNOSIS — I480 Paroxysmal atrial fibrillation: Secondary | ICD-10-CM | POA: Diagnosis not present

## 2020-12-29 DIAGNOSIS — I96 Gangrene, not elsewhere classified: Secondary | ICD-10-CM | POA: Diagnosis not present

## 2020-12-29 DIAGNOSIS — K436 Other and unspecified ventral hernia with obstruction, without gangrene: Secondary | ICD-10-CM | POA: Diagnosis not present

## 2020-12-29 DIAGNOSIS — E039 Hypothyroidism, unspecified: Secondary | ICD-10-CM | POA: Diagnosis not present

## 2020-12-29 DIAGNOSIS — Z87891 Personal history of nicotine dependence: Secondary | ICD-10-CM | POA: Diagnosis not present

## 2020-12-29 DIAGNOSIS — I10 Essential (primary) hypertension: Secondary | ICD-10-CM | POA: Diagnosis not present

## 2020-12-29 DIAGNOSIS — Z794 Long term (current) use of insulin: Secondary | ICD-10-CM | POA: Diagnosis not present

## 2020-12-29 LAB — GLUCOSE, CAPILLARY: Glucose-Capillary: 144 mg/dL — ABNORMAL HIGH (ref 70–99)

## 2020-12-29 MED ORDER — TRAMADOL HCL 50 MG PO TABS: 50.0000 mg | ORAL_TABLET | Freq: Four times a day (QID) | ORAL | 0 refills | Status: DC | PRN

## 2020-12-29 NOTE — Discharge Summary (Signed)
Physician Discharge Summary [facility]  Patient ID: Natalee Tomkiewicz MRN: [id number] DOB/AGE: 10-23-1945 75 y.o.  Admit date: 12/28/2020  Discharge date: 12/29/2020  Discharge Diagnoses:  Principal Problem:   Incisional hernia without obstruction or gangrene Active Problems:   Type 2 diabetes mellitus with diabetic peripheral angiopathy and gangrene, without long-term current use of insulin (HCC)   Hypertension   Ventral incisional hernia   Discharged Condition: good  Hospital Course: Patient was admitted for observation following incisional hernia laparoscopic surgery.  Post op course was uncomplicated.  Pain was well controlled.  Tolerated diet.  Patient was prepared for discharge home on POD#1.   Consults: None  Treatments: surgery: laparoscopic ventral incisional hernia repair with mesh  Discharge Exam: Blood pressure (!) 145/72, pulse 70, temperature 98.3 F (36.8 C), temperature source Oral, resp. rate 20, height '5\' 4"'  (1.626 m), weight 68 kg, SpO2 98 %. HEENT - clear Neck - soft Chest - clear bilaterally Cor - RRR Abd - wounds dry and intact; mild tenderness; binder in place  Disposition: Home  Discharge Instructions     Diet - low sodium heart healthy   Complete by: As directed    Discharge wound care:   Complete by: As directed    Wear abdominal binder over wound/hernia repair.  May remove to shower.   Ice pack   Complete by: As directed    Increase activity slowly   Complete by: As directed    No dressing needed   Complete by: As directed       Allergies as of 12/29/2020   No Known Allergies      Medication List     TAKE these medications    Align 4 MG Caps Take 4 mg by mouth 2 (two) times daily.   B-D SINGLE USE SWABS REGULAR Pads Use as directed.   loperamide 2 MG tablet Commonly known as: IMODIUM A-D Take 2-4 mg by mouth 4 (four) times daily as needed for diarrhea or loose stools.   losartan 50 MG  tablet Commonly known as: COZAAR Take 1 tablet (50 mg total) by mouth daily.   metFORMIN 500 MG tablet Commonly known as: GLUCOPHAGE Take 2 tablets (1,000 mg total) by mouth 2 (two) times daily with a meal.   metoprolol tartrate 25 MG tablet Commonly known as: LOPRESSOR Take 1 tablet (25 mg total) by mouth 2 (two) times daily with a meal.   NP Thyroid 90 MG tablet Generic drug: thyroid Take 1 tablet (90 mg total) by mouth daily before breakfast.   PAPAYA ENZYME PO Take 2 capsules by mouth daily as needed (heartburn).   saccharomyces boulardii 250 MG capsule Commonly known as: FLORASTOR Take 1 capsule (250 mg total) by mouth 2 (two) times daily.   traMADol 50 MG tablet Commonly known as: ULTRAM Take 1-2 tablets (50-100 mg total) by mouth every 6 (six) hours as needed for moderate pain.   True Metrix Blood Glucose Test test strip Generic drug: glucose blood Use as instructed   True Metrix Meter w/Device Kit Use as directed to test blood glucose daily   TRUEplus Lancets 33G Misc Use as directed daily.       ASK your doctor about these medications    clopidogrel 75 MG tablet Commonly known as: PLAVIX TAKE 1 TABLET BY MOUTH ONCE DAILY   rosuvastatin 40 MG tablet Commonly known as: CRESTOR TAKE 1 TABLET BY MOUTH ONCE DAILY  Discharge Care Instructions  (From admission, onward)           Start     Ordered   12/29/20 0000  No dressing needed        12/29/20 0816   12/29/20 0000  Discharge wound care:       Comments: Wear abdominal binder over wound/hernia repair.  May remove to shower.   12/29/20 0816            Follow-up Information     Armandina Gemma, MD. Schedule an appointment as soon as possible for a visit in 3 week(s).   Specialty: General Surgery Why: For wound re-check Contact information: [place] [address] [phone]                 Armandina Gemma, [facility] Office: [phone]   Signed: Armandina Gemma 12/29/2020, 8:16 AM

## 2020-12-29 NOTE — Discharge Instructions (Signed)
[facility]  LAPAROSCOPIC SURGERY:  POST-OP INSTRUCTIONS  Always review your discharge instruction sheet given to you by the facility where your surgery was performed.  A prescription for pain medication may be given to you upon discharge.  Take your pain medication as prescribed.  If narcotic pain medicine is not needed, then you may take acetaminophen (Tylenol) or ibuprofen (Advil) as needed.  Take your usually prescribed medications unless otherwise directed.  If you need a refill on your pain medication, please contact your pharmacy.  They will contact our office to request authorization. Prescriptions will not be filled after 5 P.M. or on weekends.  You should follow a light diet the first few days after arrival home, such as soup and crackers or toast.  Be sure to include plenty of fluids daily.  Most patients will experience some swelling and bruising in the area of the incisions.  Ice packs will help.  Swelling and bruising can take several days to resolve.   It is common to experience some constipation after surgery.  Increasing fluid intake and taking a stool softener (such as Colace) will usually help or prevent this problem from occurring.  A mild laxative (Milk of Magnesia or Miralax) should be taken according to package instructions if there has been no bowel movement after 48 hours.  You will likely have Dermabond (topical glue) over your incisions.  This seals the incisions and allows you to bathe and shower at any time after your surgery.  Glue should remain in place for up to 10 days.  It may be removed after 10 days by pealing off the Dermabond material or using Vaseline or naval jelly to remove.  If you have steri-strips over your incisions, you may remove the gauze bandage on the second day after surgery, and you may shower at that time.  Leave your steri-strips (small skin tapes) in place directly over the incision.  These strips should remain on the  skin for 5-7 days and then be removed.  You may get them wet in the shower and pat them dry.  Any sutures or staples will be removed at the office during your follow-up visit.  ACTIVITIES:  You may resume regular (light) daily activities beginning the next day - such as daily self-care, walking, climbing stairs - gradually increasing activities as tolerated.  You may have sexual intercourse when it is comfortable.  Refrain from any heavy lifting or straining until approved by your doctor.  You may drive when you are no longer taking prescription pain medication, when you can comfortably wear a seatbelt, and when you can safely maneuver your car and apply brakes.  You should see your doctor in the office for a follow-up appointment approximately 2-3 weeks after your surgery.  Make sure that you call for this appointment within a day or two after you arrive home to insure a convenient appointment time.  WHEN TO CALL YOUR DOCTOR: Fever over 101.0 Inability to urinate Continued bleeding from incision Increased pain, redness, or drainage from the incision Increasing abdominal pain  The clinic staff is available to answer your questions during regular business hours.  Please don't hesitate to call and ask to speak to one of the nurses for clinical concerns.  If you have a medical emergency, go to the nearest emergency room or call 911.  A surgeon from [facility] is always on call for the hospital.  Margarethe Virgen, MD [facility] Office: [phone] Toll Free:    [phone] FAX [phone]  Website: [email or web address] 

## 2020-12-29 NOTE — Progress Notes (Signed)
Pt ate 75% of her Regular diet, denies nausea and per pt,"food settled down good". Ambulated independently on the on the hallway.  Ready to be discharged per order.

## 2020-12-30 LAB — HEMOGLOBIN A1C
Hgb A1c MFr Bld: 6.7 % — ABNORMAL HIGH (ref 4.8–5.6)
Mean Plasma Glucose: 146 mg/dL

## 2020-12-31 ENCOUNTER — Other Ambulatory Visit: Payer: Self-pay | Admitting: Family Medicine

## 2020-12-31 DIAGNOSIS — E1152 Type 2 diabetes mellitus with diabetic peripheral angiopathy with gangrene: Secondary | ICD-10-CM

## 2021-01-06 ENCOUNTER — Encounter (INDEPENDENT_AMBULATORY_CARE_PROVIDER_SITE_OTHER): Payer: Medicare HMO | Admitting: Ophthalmology

## 2021-01-06 DIAGNOSIS — H25813 Combined forms of age-related cataract, bilateral: Secondary | ICD-10-CM

## 2021-01-06 DIAGNOSIS — H35033 Hypertensive retinopathy, bilateral: Secondary | ICD-10-CM

## 2021-01-06 DIAGNOSIS — H3581 Retinal edema: Secondary | ICD-10-CM

## 2021-01-06 DIAGNOSIS — I10 Essential (primary) hypertension: Secondary | ICD-10-CM

## 2021-01-06 DIAGNOSIS — E113313 Type 2 diabetes mellitus with moderate nonproliferative diabetic retinopathy with macular edema, bilateral: Secondary | ICD-10-CM

## 2021-01-08 ENCOUNTER — Ambulatory Visit (INDEPENDENT_AMBULATORY_CARE_PROVIDER_SITE_OTHER): Payer: Medicare HMO | Admitting: Family Medicine

## 2021-01-08 ENCOUNTER — Encounter: Payer: Self-pay | Admitting: Family Medicine

## 2021-01-08 VITALS — BP 149/60 | HR 70 | Temp 97.9°F | Ht 64.0 in | Wt 152.0 lb

## 2021-01-08 DIAGNOSIS — I480 Paroxysmal atrial fibrillation: Secondary | ICD-10-CM

## 2021-01-08 DIAGNOSIS — E119 Type 2 diabetes mellitus without complications: Secondary | ICD-10-CM | POA: Diagnosis not present

## 2021-01-08 DIAGNOSIS — I10 Essential (primary) hypertension: Secondary | ICD-10-CM

## 2021-01-08 DIAGNOSIS — K432 Incisional hernia without obstruction or gangrene: Secondary | ICD-10-CM

## 2021-01-08 NOTE — Patient Instructions (Signed)
Great to see you! Continue probiotic.  See me again in 3-4 months.

## 2021-01-11 ENCOUNTER — Encounter: Payer: Self-pay | Admitting: Family Medicine

## 2021-01-11 NOTE — Progress Notes (Signed)
Erin Mitchell [facility] - 75 y.o. female MRN [id number]  Date of birth: 12-19-1945  Subjective Chief Complaint  Patient presents with   Hypertension   Diabetes    HPI Erin Mitchell is a 75 year old female here today for follow-up visit.  She recently had incisional hernia repair and is recovering well from this.  Bowels are moving normally.  She has not had any fever or chills.  Appetite is normal.  Her blood pressure and heart rate have remained well controlled with metoprolol and losartan.  She has not had any increase symptoms including chest pain, shortness of breath, palpitations or dizziness.  Reports stable blood sugars recently.  She is taking metformin consistently.  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   VENTRAL HERNIA REPAIR N/A 12/28/2020   Procedure: LAPAROSCOPIC VENTRAL INCISIONAL HERNIA REPAIR WITH MESH;  Surgeon: Darnell Level, MD;  Location: [facility];  Service: General;  Laterality: N/A;    Social History   Socioeconomic History   Marital status: Married    Spouse name: Not on file   Number of children: 7   Years of education: Not on file   Highest education level: Not on file  Occupational History   Occupation: Retired  Tobacco Use   Smoking status: Former    Packs/day: 0.25    Years: 1.00    Pack years: 0.25    Types: Cigarettes    Quit date: 06/06/1970    Years since quitting: 50.6   Smokeless tobacco: Never  Vaping Use   Vaping Use: Never used  Substance and Sexual Activity   Alcohol  use: Never   Drug use: Never   Sexual activity: Not on file  Other Topics Concern   Not on file  Social History Narrative   Not on file   Social Determinants of Health   Financial Resource Strain: Not on file  Food Insecurity: Not on file  Transportation Needs: Not on file  Physical Activity: Not on file  Stress: Not on file  Social Connections: Not on file    Family History  Problem Relation Age of Onset   Hypertension Father    Diabetes Brother    Emphysema Mother     Health Maintenance  Topic Date Due   OPHTHALMOLOGY EXAM  Never done   Hepatitis C Screening  Never done   Zoster Vaccines- Shingrix (1 of 2) Never done   COVID-19 Vaccine (3 - Booster for Pfizer series) 12/28/2019   PNA vac Low Risk Adult (2 of 2 - PPSV23) 01/22/2020   INFLUENZA VACCINE  01/04/2021   HEMOGLOBIN A1C  06/30/2021   FOOT EXAM  10/16/2021   TETANUS/TDAP  08/15/2029   HPV VACCINES  Aged Out   MAMMOGRAM  Discontinued   DEXA SCAN  Discontinued   COLONOSCOPY (Pts 45-76yrs Insurance coverage will need to be confirmed)  Discontinued     -----------------------------------------------------------------------------------------------------------------------------------------------------------------------------------------------------------------  Physical Exam BP (!) 149/60   Pulse 70   Temp 97.9 F (36.6 C) (Oral)   Ht 5\' 4"  (1.626 m)   Wt 152 lb (68.9 kg)   SpO2 99% Comment: on RA  BMI 26.09 kg/m   Physical Exam Constitutional:      Appearance: Normal appearance.  Eyes:     General: No scleral icterus. Cardiovascular:     Rate and Rhythm: Normal rate and regular rhythm.  Pulmonary:     Effort: Pulmonary effort is normal.     Breath sounds: Normal breath sounds.  Musculoskeletal:     Cervical back: Neck supple.  Neurological:     Mental Status: She is alert.  Psychiatric:        Mood and Affect: Mood normal.        Behavior: Behavior normal.     ------------------------------------------------------------------------------------------------------------------------------------------------------------------------------------------------------------------- Assessment and Plan  Ventral incisional hernia Recent repair, doing well.  She will plan to follow-up with surgeon as directed.  Diabetes (HCC) Reports blood sugars are stable.  She will continue metformin.  PAF (paroxysmal atrial fibrillation) (HCC) She is seeing cardiology.  Singular episode without any recurrence.  Doing well with metoprolol for management of rate and hypertension.   No orders of the defined types were placed in this encounter.   Return in about 3 months (around 04/10/2021) for T2DM/HTN.    This visit occurred during the SARS-CoV-2 public health emergency.  Safety protocols were in place, including screening questions prior to the visit, additional usage of staff PPE, and extensive cleaning of exam room while observing appropriate contact time as indicated for disinfecting solutions.

## 2021-01-11 NOTE — Assessment & Plan Note (Signed)
She is seeing cardiology.  Singular episode without any recurrence.  Doing well with metoprolol for management of rate and hypertension.

## 2021-01-11 NOTE — Assessment & Plan Note (Signed)
Reports blood sugars are stable.  She will continue metformin.

## 2021-01-11 NOTE — Assessment & Plan Note (Signed)
Recent repair, doing well.  She will plan to follow-up with surgeon as directed.

## 2021-01-19 DIAGNOSIS — Z8719 Personal history of other diseases of the digestive system: Secondary | ICD-10-CM | POA: Insufficient documentation

## 2021-01-19 DIAGNOSIS — Z9889 Other specified postprocedural states: Secondary | ICD-10-CM | POA: Insufficient documentation

## 2021-01-22 ENCOUNTER — Encounter: Payer: Self-pay | Admitting: Podiatry

## 2021-01-22 ENCOUNTER — Ambulatory Visit (INDEPENDENT_AMBULATORY_CARE_PROVIDER_SITE_OTHER): Payer: Medicare HMO | Admitting: Podiatry

## 2021-01-22 ENCOUNTER — Other Ambulatory Visit: Payer: Self-pay

## 2021-01-22 DIAGNOSIS — M79675 Pain in left toe(s): Secondary | ICD-10-CM | POA: Diagnosis not present

## 2021-01-22 DIAGNOSIS — B351 Tinea unguium: Secondary | ICD-10-CM

## 2021-01-22 DIAGNOSIS — M79674 Pain in right toe(s): Secondary | ICD-10-CM

## 2021-01-22 DIAGNOSIS — Z89429 Acquired absence of other toe(s), unspecified side: Secondary | ICD-10-CM | POA: Diagnosis not present

## 2021-01-22 DIAGNOSIS — Z7901 Long term (current) use of anticoagulants: Secondary | ICD-10-CM

## 2021-01-22 NOTE — Progress Notes (Signed)
Subjective: 75 y.o. returns the office today for painful, elongated, thickened toenails which she  cannot trim hreself. Denies any redness or drainage around the nails. Denies any acute changes since last appointment and no new complaints today. Denies any systemic complaints such as fevers, chills, nausea, vomiting.   PCP: Everrett Coombe, DO Last A1c was 6.7 on December 28, 2020   Objective: AAO 3, NAD DP/PT pulses palpable, CRT less than 3 seconds Nails hypertrophic, dystrophic, elongated, brittle, discolored 8. There is tenderness overlying the nails 1-5 bilaterally. There is no surrounding erythema or drainage along the nail sites. Previous right second, third amputations.  There is very severe bunion deformity as well as splaying of the forefoot. No pain with calf compression, swelling, warmth, erythema.  Assessment: Patient presents with symptomatic onychomycosis  Plan: -Treatment options including alternatives, risks, complications were discussed -Nails sharply debrided 8 without complication/bleeding. -Discussed daily foot inspection. If there are any changes, to call the office immediately.  -Follow-up in 3 months or sooner if any problems are to arise. In the meantime, encouraged to call the office with any questions, concerns, changes symptoms.  Ovid Curd, DPM

## 2021-02-05 NOTE — Progress Notes (Signed)
[facility] Note  02/10/2021     CHIEF COMPLAINT Patient presents for Retina Follow Up   HISTORY OF PRESENT ILLNESS: Debralee Braaksma is a 75 y.o. female who presents to the clinic today for:   HPI     Retina Follow Up   Patient presents with  Diabetic Retinopathy.  In both eyes.  This started weeks ago.  Severity is moderate.  Duration of weeks.  Since onset it is stable.  I, the attending physician,  performed the HPI with the patient and updated documentation appropriately.        Comments   A1c: 6.8 BS: 140 Pt states vision is about the same OU.  Pt denies eye pain or discomfort and denies any new or worsening floaters or fol OU.      Last edited by Bernarda Caffey, MD on 02/10/2021 12:08 PM.    Patient did have her hernia sx without complications.    Referring physician: Luetta Nutting, DO [address]  [address] [place],  [place] [address]  HISTORICAL INFORMATION:   Selected notes from the MEDICAL RECORD NUMBER Referred by Dr. Luetta Nutting for DM exam LEE:  Ocular Hx- PMH- DM, HTN, afib, kidney disease, last a1c was 6.8 on 05.05.22    CURRENT MEDICATIONS: No current outpatient medications on file. (Ophthalmic Drugs)   No current facility-administered medications for this visit. (Ophthalmic Drugs)   Current Outpatient Medications (Other)  Medication Sig   Alcohol Swabs (DROPSAFE ALCOHOL PREP) 70 % PADS USE AS DIRECTED.   Blood Glucose Monitoring Suppl (TRUE METRIX METER) w/Device KIT Use as directed to test blood glucose daily   clopidogrel (PLAVIX) 75 MG tablet TAKE 1 TABLET BY MOUTH ONCE DAILY (Patient taking differently: Take 75 mg by mouth daily.)   glucose blood (TRUE METRIX BLOOD GLUCOSE TEST) test strip Use as instructed   loperamide (IMODIUM A-D) 2 MG tablet Take 2-4 mg by mouth 4 (four) times daily as needed for diarrhea or loose stools.   losartan (COZAAR) 50 MG tablet Take 1 tablet (50 mg total) by  mouth daily.   metFORMIN (GLUCOPHAGE) 500 MG tablet Take 2 tablets (1,000 mg total) by mouth 2 (two) times daily with a meal.   metoprolol tartrate (LOPRESSOR) 25 MG tablet Take 1 tablet (25 mg total) by mouth 2 (two) times daily with a meal.   NP THYROID 90 MG tablet Take 1 tablet (90 mg total) by mouth daily before breakfast.   PAPAYA ENZYME PO Take 2 capsules by mouth daily as needed (heartburn).   Probiotic Product (ALIGN) 4 MG CAPS Take 4 mg by mouth 2 (two) times daily.   rosuvastatin (CRESTOR) 40 MG tablet TAKE 1 TABLET BY MOUTH ONCE DAILY (Patient taking differently: Take 40 mg by mouth at bedtime.)   saccharomyces boulardii (FLORASTOR) 250 MG capsule Take 1 capsule (250 mg total) by mouth 2 (two) times daily.   traMADol (ULTRAM) 50 MG tablet Take 1-2 tablets (50-100 mg total) by mouth every 6 (six) hours as needed for moderate pain.   TRUEplus Lancets 33G MISC Use as directed daily.   No current facility-administered medications for this visit. (Other)   REVIEW OF SYSTEMS: ROS   Positive for: Gastrointestinal, Genitourinary, Endocrine, Cardiovascular, Eyes Negative for: Constitutional, Neurological, Skin, Musculoskeletal, HENT, Respiratory, Psychiatric, Allergic/Imm, Heme/Lymph Last edited by Doneen Poisson on 02/10/2021  9:06 AM.     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   VENTRAL HERNIA REPAIR N/A 12/28/2020   Procedure: LAPAROSCOPIC VENTRAL [place];  Surgeon: Armandina Gemma, MD;  Location: [facility];  Service: General;  Laterality:  N/A;    FAMILY HISTORY Family History  Problem Relation Age of Onset   Hypertension Father    Diabetes Brother    Emphysema Mother     SOCIAL HISTORY Social History   Tobacco Use   Smoking status: Former    Packs/day: 0.25    Years: 1.00    Pack years: 0.25    Types: Cigarettes    Quit date: 06/06/1970    Years since quitting: 50.7   Smokeless tobacco: Never  Vaping Use   Vaping Use: Never used  Substance Use Topics   Alcohol use: Never   Drug use: Never         OPHTHALMIC EXAM:  Base Eye Exam     Visual Acuity (Snellen - Linear)       Right Left   Dist cc 20/40 -1 20/20   Dist ph cc NI     Correction: Glasses         Tonometry (Tonopen, 9:13 AM)       Right Left   Pressure 14 14         Pupils       Dark Light Shape React APD   Right 2 1 Round Minimal 0   Left 2 1 Round Minimal 0         Visual Fields       Left Right    Full Full         Extraocular Movement       Right Left    Full Full         Neuro/Psych     Oriented x3: Yes   Mood/Affect: Normal         Dilation     Both eyes: 1.0% Mydriacyl, 2.5% Phenylephrine @ 9:13 AM           Slit Lamp and Fundus Exam     Slit Lamp Exam       Right Left   Lids/Lashes Dermatochalasis - upper lid, Meibomian gland dysfunction Dermatochalasis - upper lid, Meibomian gland dysfunction   Conjunctiva/Sclera White and quiet White and quiet   Cornea arcus, trace PEE arcus, trace PEE   Anterior Chamber deep, clear, narrow angles deep, clear, narrow angles   Iris Round and moderately dilated, mild anterior bowing Round and moderately dilated, mild anterior bowing   Lens 2-3+ Nuclear sclerosis with brunescence, 2+ Cortical cataract 2-3+ Nuclear sclerosis with brunescence, 2+ Cortical cataract   Vitreous Vitreous syneresis, Posterior vitreous detachment Vitreous syneresis, Posterior vitreous detachment         Fundus Exam       Right Left   Disc Pink and Sharp, +cupping  Pink and Sharp, +cupping   C/D Ratio 0.7 0.65   Macula Blunted foveal reflex, central  edema, RPE mottling, cluster of MA temporal to fovea Flat, Blunted foveal reflex, RPE mottling, scatterd MA, non-central cystic changes temp mac slightly increased   Vessels attenuated, Tortuous attenuated, Tortuous   Periphery Attached, mild reticular degeneration, mild MA Attached, mild reticular degeneration, rare MA, cluster DBH inf to disc           Refraction     Wearing Rx       Sphere Cylinder Add   Right +1.25 Sphere +2.75   Left +1.50 Sphere +2.75    Type: Bifocal            IMAGING AND PROCEDURES  Imaging and Procedures for 02/10/2021  OCT, Retina - OU - Both Eyes       Right Eye Quality was good. Central Foveal Thickness: 449. Progression has improved. Findings include abnormal foveal contour, intraretinal fluid, no SRF, vitreomacular adhesion (Mild interval improvement central and temp IRF).   Left Eye Quality was good. Central Foveal Thickness: 251. Progression has worsened. Findings include abnormal foveal contour, no SRF, intraretinal fluid, vitreomacular adhesion (Non-central cystic changes temporal and inferior macula caught on widefield -- slightly increased ).   Notes *Images captured and stored on drive  Diagnosis / Impression:  OD: +central DME, Mild interval improvement central and temp IRF OS: Non-central cystic changes temporal and inferior macula caught on widefield -- slightly increased   Clinical management:  See below  Abbreviations: NFP - Normal foveal profile. CME - cystoid macular edema. PED - pigment epithelial detachment. IRF - intraretinal fluid. SRF - subretinal fluid. EZ - ellipsoid zone. ERM - epiretinal membrane. ORA - outer retinal atrophy. ORT - outer retinal tubulation. SRHM - subretinal hyper-reflective material. IRHM - intraretinal hyper-reflective material      Intravitreal Injection, Pharmacologic Agent - OD - Right Eye       Time  Out 02/10/2021. 9:42 AM. Confirmed correct patient, procedure, site, and patient consented.   Anesthesia Topical anesthesia was used. Anesthetic medications included Lidocaine 2%, Proparacaine 0.5%.   Procedure Preparation included 5% betadine to ocular surface. A (32g) needle was used.   Injection: 2 mg aflibercept 2 MG/0.05ML   Route: Intravitreal, Site: Right Eye   NDC: [id number], Lot: [id number], Expiration date: 12/03/2021, Waste: 0.05 mL   Post-op Post injection exam found visual acuity of at least counting fingers. The patient tolerated the procedure well. There were no complications. The patient received written and verbal post procedure care education. Post injection medications were not given.   Notes             ASSESSMENT/PLAN:    ICD-10-CM   1. Moderate nonproliferative diabetic retinopathy of both eyes with macular edema associated with type 2 diabetes mellitus (HCC)  R67.8938 Intravitreal Injection, Pharmacologic Agent - OD - Right Eye    aflibercept (EYLEA) SOLN 2 mg    2. Retinal edema  H35.81 OCT, Retina - OU - Both Eyes    3. Essential hypertension  I10     4. Hypertensive retinopathy of both eyes  H35.033     5. Combined forms of age-related cataract of both eyes  H25.813       1,2. Moderate non-proliferative diabetic retinopathy, both eyes  - f/u delayed to 11 wks from 4-6 wks due to hernia repair surgery  - s/p IVE #1 OD (06.22.22)--sample - previously [facility] by Dr. Theora Gianotti (Fax [phone]) - history of anti-VEGF injections since 2019 -- last injection IVE OD Feb/Mar 2022 -- q61mointerval -  will try to obtain records from [facility] - exam w/ scattered MA - OCT shows diabetic macular edema, both eyes -- OD slightly improved, OS slightly increased at 11 wks - BCVA 20/40 OD; 20/20 OS - recommend IVE #2 OD today, 09.07.22 -- need to sign up for Eylea benefits today -- f/u in 6 wks - pt wishes to proceed -  RBA of procedure discussed, questions answered - informed consent obtained and signed - see procedure note - f/u in 6 wks -- DFE/OCT/ FA transit OD, possible injection(s)  3,4. Hypertensive retinopathy OU - discussed importance of tight BP control - monitor  5. Mixed Cataract OU - The symptoms of cataract, surgical options, and treatments and risks were discussed with patient. - discussed diagnosis and progression - not yet visually significant - monitor for now  Ophthalmic Meds Ordered this visit:  Meds ordered this encounter  Medications   aflibercept (EYLEA) SOLN 2 mg      Return for 6 weeks for DFE, OCT, FA transit OD, possible injection.  There are no Patient Instructions on file for this visit.   This document serves as a record of services personally performed by Gardiner Sleeper, MD, PhD. It was created on their behalf by Orvan Falconer, an ophthalmic technician. The creation of this record is the provider's dictation and/or activities during the visit.    Electronically signed by: Orvan Falconer, OA, 02/10/21  12:12 PM   This document serves as a record of services personally performed by Gardiner Sleeper, MD, PhD. It was created on their behalf by San Jetty. Owens Shark, OA an ophthalmic technician. The creation of this record is the provider's dictation and/or activities during the visit.    Electronically signed by: San Jetty. Owens Shark, [place] 09.07.2022 12:12 PM  This document serves as a record of services personally performed by Gardiner Sleeper, MD, PhD. It was created on their behalf by Leonie Douglas, an ophthalmic technician. The creation of this record is the provider's dictation and/or activities during the visit.    Electronically signed by: Leonie Douglas COA, 02/10/21  12:12 PM  Gardiner Sleeper, M.D., Ph.D. Diseases & Surgery of the Retina and [place] 02/10/2021    I have reviewed the above documentation for accuracy and  completeness, and I agree with the above. Gardiner Sleeper, M.D., Ph.D. 02/10/21 12:12 PM   Abbreviations: M myopia (nearsighted); A astigmatism; H hyperopia (farsighted); P presbyopia; Mrx spectacle prescription;  CTL contact lenses; OD right eye; OS left eye; OU both eyes  XT exotropia; ET esotropia; PEK punctate epithelial keratitis; PEE punctate epithelial erosions; DES dry eye syndrome; MGD meibomian gland dysfunction; ATs artificial tears; PFAT's preservative free artificial tears; [place] nuclear sclerotic cataract; PSC posterior subcapsular cataract; ERM epi-retinal membrane; PVD posterior vitreous detachment; RD retinal detachment; DM diabetes mellitus; DR diabetic retinopathy; NPDR non-proliferative diabetic retinopathy; PDR proliferative diabetic retinopathy; CSME clinically significant macular edema; DME diabetic macular edema; dbh dot blot hemorrhages; CWS cotton wool spot; POAG primary open angle glaucoma; C/D cup-to-disc ratio; HVF humphrey visual field; GVF goldmann visual field; OCT optical coherence tomography; IOP intraocular pressure; BRVO Branch retinal vein occlusion; CRVO central retinal vein occlusion; CRAO central retinal artery occlusion; BRAO branch retinal artery occlusion; RT retinal tear; SB scleral buckle; PPV pars plana vitrectomy; VH Vitreous hemorrhage; PRP panretinal laser photocoagulation; IVK intravitreal kenalog; VMT vitreomacular traction; MH Macular hole;  NVD neovascularization of the disc; NVE neovascularization elsewhere; AREDS age related eye  disease study; ARMD age related macular degeneration; POAG primary open angle glaucoma; EBMD epithelial/anterior basement membrane dystrophy; ACIOL anterior chamber intraocular lens; IOL intraocular lens; PCIOL posterior chamber intraocular lens; Phaco/IOL phacoemulsification with intraocular lens placement; [place] photorefractive keratectomy; LASIK laser assisted in situ keratomileusis; HTN hypertension; DM diabetes mellitus; COPD  chronic obstructive pulmonary disease

## 2021-02-10 ENCOUNTER — Encounter (INDEPENDENT_AMBULATORY_CARE_PROVIDER_SITE_OTHER): Payer: Self-pay | Admitting: Ophthalmology

## 2021-02-10 ENCOUNTER — Ambulatory Visit (INDEPENDENT_AMBULATORY_CARE_PROVIDER_SITE_OTHER): Payer: Medicare HMO | Admitting: Ophthalmology

## 2021-02-10 ENCOUNTER — Other Ambulatory Visit: Payer: Self-pay

## 2021-02-10 DIAGNOSIS — I10 Essential (primary) hypertension: Secondary | ICD-10-CM | POA: Diagnosis not present

## 2021-02-10 DIAGNOSIS — H25813 Combined forms of age-related cataract, bilateral: Secondary | ICD-10-CM | POA: Diagnosis not present

## 2021-02-10 DIAGNOSIS — E113313 Type 2 diabetes mellitus with moderate nonproliferative diabetic retinopathy with macular edema, bilateral: Secondary | ICD-10-CM | POA: Diagnosis not present

## 2021-02-10 DIAGNOSIS — H35033 Hypertensive retinopathy, bilateral: Secondary | ICD-10-CM | POA: Diagnosis not present

## 2021-02-10 DIAGNOSIS — H3581 Retinal edema: Secondary | ICD-10-CM | POA: Diagnosis not present

## 2021-02-10 MED ORDER — AFLIBERCEPT 2MG/0.05ML IZ SOLN FOR KALEIDOSCOPE
2.0000 mg | INTRAVITREAL | Status: AC | PRN
  Administered 2021-02-10: 2 mg via INTRAVITREAL

## 2021-02-15 ENCOUNTER — Other Ambulatory Visit: Payer: Self-pay | Admitting: Family Medicine

## 2021-02-18 DIAGNOSIS — E785 Hyperlipidemia, unspecified: Secondary | ICD-10-CM | POA: Diagnosis not present

## 2021-02-19 LAB — LIPID PANEL
Chol/HDL Ratio: 2.6 ratio (ref 0.0–4.4)
Cholesterol, Total: 135 mg/dL (ref 100–199)
HDL: 51 mg/dL (ref >39)
LDL Chol Calc (NIH): 59 mg/dL (ref 0–99)
Triglycerides: 145 mg/dL (ref 0–149)
VLDL Cholesterol Cal: 25 mg/dL (ref 5–40)

## 2021-02-19 LAB — HEPATIC FUNCTION PANEL
ALT: 12 IU/L (ref 0–32)
AST: 16 IU/L (ref 0–40)
Albumin: 4.1 g/dL (ref 3.7–4.7)
Alkaline Phosphatase: 87 IU/L (ref 44–121)
Bilirubin Total: 0.3 mg/dL (ref 0.0–1.2)
Bilirubin, Direct: 0.11 mg/dL (ref 0.00–0.40)
Total Protein: 6.3 g/dL (ref 6.0–8.5)

## 2021-02-22 ENCOUNTER — Encounter: Payer: Self-pay | Admitting: Family Medicine

## 2021-02-22 ENCOUNTER — Ambulatory Visit (INDEPENDENT_AMBULATORY_CARE_PROVIDER_SITE_OTHER): Payer: Medicare HMO | Admitting: Family Medicine

## 2021-02-22 ENCOUNTER — Other Ambulatory Visit: Payer: Self-pay

## 2021-02-22 VITALS — BP 164/92 | HR 78 | Temp 97.8°F | Resp 17

## 2021-02-22 DIAGNOSIS — R42 Dizziness and giddiness: Secondary | ICD-10-CM | POA: Diagnosis not present

## 2021-02-22 DIAGNOSIS — I10 Essential (primary) hypertension: Secondary | ICD-10-CM

## 2021-02-22 LAB — TROPONIN I: Troponin I: 8 ng/L (ref <=47)

## 2021-02-22 MED ORDER — MECLIZINE HCL 25 MG PO TABS: 25.0000 mg | ORAL_TABLET | Freq: Three times a day (TID) | ORAL | 0 refills | Status: DC | PRN

## 2021-02-22 NOTE — Patient Instructions (Addendum)
Physical therapy referral placed Meclizine sent in - try to take as rarely as possible, may cause drowsiness. Try to wait until you can go to therapy before taking it so they can see your full symptoms. Will let you know blood work results

## 2021-02-22 NOTE — Progress Notes (Signed)
Acute Office Visit  Subjective:    Patient ID: Erin Mitchell, female    DOB: [birth year], 75 y.o.   MRN: [id number]  Chief Complaint  Patient presents with   Dizziness    Dizziness  Patient is in today for dizziness.    Last week she noticed some dizziness in bed that lasted for a few seconds, always went away so stopped worrying about it Last night, got up to use the bathroom, when she laid back down the room started spinning for about 60 seconds then she fell asleep. When she woke up to take her thyroid medicine, then got back in bed, the room started spinning again. Seemed to improve so got up and continued her morning routine. After using the bathroom this morning the room started spinning again and she bumped into the cabinet in the bathroom Not as dizzy right now as she was earlier in the day Could barely walk across the room without getting dizzy yesterday Slowly improved today Mild nausea with dizzy episodes, no vomiting Changes position a lot in bed, can't tell if the rolling triggered the spinning  No recent infections Denies cp, sob, headaches, fevers, vision changes, arm pain, jaw pain, fatigue  Home COVID test negative  Concerned about heart and stroke - would like full workup   BP 130/80's at home the past few mornings.   Past Medical History:  Diagnosis Date   Atrial fibrillation (HCC)    on Plavix   Cataract    Diabetes (HCC)    Diabetic retinopathy (HCC)    Exposure to Agent Orange    Hypertension    hypothyroidism    Hypothyroidism    Kidney disease     Past Surgical History:  Procedure Laterality Date   APPENDECTOMY     CESAREAN SECTION     CHOLECYSTECTOMY     HERNIA REPAIR     TOE AMPUTATION Right    2nd & 3rd   VENTRAL HERNIA REPAIR N/A 12/28/2020   Procedure: LAPAROSCOPIC VENTRAL INCISIONAL HERNIA REPAIR WITH MESH;  Surgeon: Darnell Level, MD;  Location: [facility];  Service: General;  Laterality: N/A;    Family History   Problem Relation Age of Onset   Hypertension Father    Diabetes Brother    Emphysema Mother     Social History   Socioeconomic History   Marital status: Married    Spouse name: Not on file   Number of children: 7   Years of education: Not on file   Highest education level: Not on file  Occupational History   Occupation: Retired  Tobacco Use   Smoking status: Former    Packs/day: 0.25    Years: 1.00    Pack years: 0.25    Types: Cigarettes    Quit date: 06/06/1970    Years since quitting: 50.7   Smokeless tobacco: Never  Vaping Use   Vaping Use: Never used  Substance and Sexual Activity   Alcohol use: Never   Drug use: Never   Sexual activity: Not on file  Other Topics Concern   Not on file  Social History Narrative   Not on file   Social Determinants of Health   Financial Resource Strain: Not on file  Food Insecurity: Not on file  Transportation Needs: Not on file  Physical Activity: Not on file  Stress: Not on file  Social Connections: Not on file  Intimate Partner Violence: Not on file    Outpatient Medications Prior to Visit  Medication Sig Dispense Refill   Alcohol Swabs (DROPSAFE ALCOHOL PREP) 70 % PADS USE AS DIRECTED. 300 each 2   Blood Glucose Monitoring Suppl (TRUE METRIX METER) w/Device KIT Use as directed to test blood glucose daily 1 kit 0   clopidogrel (PLAVIX) 75 MG tablet TAKE 1 TABLET BY MOUTH ONCE DAILY (Patient taking differently: Take 75 mg by mouth daily.) 90 tablet 3   glucose blood (TRUE METRIX BLOOD GLUCOSE TEST) test strip Use as instructed 100 each 12   loperamide (IMODIUM A-D) 2 MG tablet Take 2-4 mg by mouth 4 (four) times daily as needed for diarrhea or loose stools.     losartan (COZAAR) 50 MG tablet Take 1 tablet (50 mg total) by mouth daily. 90 tablet 3   metFORMIN (GLUCOPHAGE) 500 MG tablet Take 2 tablets (1,000 mg total) by mouth 2 (two) times daily with a meal.     metoprolol tartrate (LOPRESSOR) 25 MG tablet Take 1 tablet (25  mg total) by mouth 2 (two) times daily with a meal.     NP THYROID 90 MG tablet TAKE 1 TABLET (90 MG TOTAL) BY MOUTH DAILY BEFORE BREAKFAST. 30 tablet 2   PAPAYA ENZYME PO Take 2 capsules by mouth daily as needed (heartburn).     Probiotic Product (ALIGN) 4 MG CAPS Take 4 mg by mouth 2 (two) times daily.     rosuvastatin (CRESTOR) 40 MG tablet TAKE 1 TABLET BY MOUTH ONCE DAILY (Patient taking differently: Take 40 mg by mouth at bedtime.) 90 tablet 3   saccharomyces boulardii (FLORASTOR) 250 MG capsule Take 1 capsule (250 mg total) by mouth 2 (two) times daily. 60 capsule 0   traMADol (ULTRAM) 50 MG tablet Take 1-2 tablets (50-100 mg total) by mouth every 6 (six) hours as needed for moderate pain. 15 tablet 0   TRUEplus Lancets 33G MISC Use as directed daily. 100 each 3   No facility-administered medications prior to visit.    No Known Allergies  Review of Symptoms All review of systems negative except what is listed in the HPI     Objective:    Physical Exam Vitals reviewed.  Constitutional:      Appearance: Normal appearance.  HENT:     Head:     Comments: Dix Hallpike produced significant dizziness (L>R), but no visible nystagmus    Right Ear: Tympanic membrane normal.     Left Ear: Tympanic membrane normal.     Nose: Congestion present.     Mouth/Throat:     Mouth: Mucous membranes are moist.     Pharynx: Oropharynx is clear.  Eyes:     Extraocular Movements: Extraocular movements intact.     Conjunctiva/sclera: Conjunctivae normal.     Pupils: Pupils are equal, round, and reactive to light.  Cardiovascular:     Rate and Rhythm: Normal rate and regular rhythm.     Heart sounds: Normal heart sounds.  Pulmonary:     Breath sounds: Normal breath sounds.  Musculoskeletal:        General: Normal range of motion.     Cervical back: Normal range of motion and neck supple.  Neurological:     General: No focal deficit present.     Mental Status: She is alert and oriented to  person, place, and time. Mental status is at baseline.     Cranial Nerves: No cranial nerve deficit.     Motor: No weakness.     Coordination: Coordination normal.     Gait:  Gait normal.  Psychiatric:        Mood and Affect: Mood normal.        Behavior: Behavior normal.        Thought Content: Thought content normal.        Judgment: Judgment normal.    There were no vitals taken for this visit. Wt Readings from Last 3 Encounters:  01/08/21 152 lb (68.9 kg)  12/28/20 150 lb (68 kg)  12/24/20 148 lb 12.8 oz (67.5 kg)    Health Maintenance Due  Topic Date Due   OPHTHALMOLOGY EXAM  Never done   Hepatitis C Screening  Never done   Zoster Vaccines- Shingrix (1 of 2) Never done   COVID-19 Vaccine (3 - Booster for Pfizer series) 12/28/2019   INFLUENZA VACCINE  01/04/2021    There are no preventive care reminders to display for this patient.   Lab Results  Component Value Date   TSH 2.65 10/08/2020   Lab Results  Component Value Date   WBC 7.6 12/02/2020   HGB 10.5 (L) 12/02/2020   HCT 32.0 (L) 12/02/2020   MCV 87.9 12/02/2020   PLT 221 12/02/2020   Lab Results  Component Value Date   NA 138 12/02/2020   K 3.9 12/02/2020   CO2 22 12/02/2020   GLUCOSE 145 (H) 12/02/2020   BUN 18 12/02/2020   CREATININE 0.69 12/02/2020   BILITOT 0.3 02/18/2021   ALKPHOS 87 02/18/2021   AST 16 02/18/2021   ALT 12 02/18/2021   PROT 6.3 02/18/2021   ALBUMIN 4.1 02/18/2021   CALCIUM 8.2 (L) 12/02/2020   ANIONGAP 4 (L) 12/02/2020   EGFR 81 11/27/2020   Lab Results  Component Value Date   CHOL 135 02/18/2021   Lab Results  Component Value Date   HDL 51 02/18/2021   Lab Results  Component Value Date   LDLCALC 59 02/18/2021   Lab Results  Component Value Date   TRIG 145 02/18/2021   Lab Results  Component Value Date   CHOLHDL 2.6 02/18/2021   Lab Results  Component Value Date   HGBA1C 6.7 (H) 12/28/2020       Assessment & Plan:   1. Dizziness 2. Vertigo HPI  consistent with vertigo. Patient is agreeable to PT. Sending in meclizine but encouraged to use as little as possible due to side effects. Should try to get in with PT before starting meclizine if able to manage. Educated on safety and fall prevention. Patient has had a lot of health events lately and wanted to be sure she was not having a heart attack or stroke. Neuro exam normal, NIHSS = 0. EKG NSR 1 degree AV, no ischemic changes. Ordering troponin for confirmation.   - Troponin I - - EKG 12-Lead - Ambulatory referral to Physical Therapy - meclizine (ANTIVERT) 25 MG tablet; Take 1 tablet (25 mg total) by mouth 3 (three) times daily as needed for dizziness.  Dispense: 30 tablet; Refill: 0  3. Hypertension BP elevated today. Patient reports "white coat syndrome" and states her BP at home for the past few days has averaged 130/80s. Would like her to continue monitoring and taking medication as prescribed. Let us know if BP elevated at home.   Patient aware of signs/symptoms requiring further/urgent evaluation.  Follow-up in 2-3 weeks to recheck vertigo and BP.  Purcell Nails Olevia Bowens, DNP, FNP-C

## 2021-02-24 ENCOUNTER — Ambulatory Visit: Payer: Medicare HMO | Admitting: Physical Therapy

## 2021-02-24 ENCOUNTER — Encounter: Payer: Self-pay | Admitting: Physical Therapy

## 2021-02-24 ENCOUNTER — Other Ambulatory Visit: Payer: Self-pay

## 2021-02-24 ENCOUNTER — Ambulatory Visit (INDEPENDENT_AMBULATORY_CARE_PROVIDER_SITE_OTHER): Payer: Medicare HMO | Admitting: Physical Therapy

## 2021-02-24 DIAGNOSIS — R42 Dizziness and giddiness: Secondary | ICD-10-CM | POA: Diagnosis not present

## 2021-02-24 NOTE — Therapy (Addendum)
[facility] [place], [place], [address] Phone: [phone]   Fax:  [phone]  Physical Therapy Evaluation  Patient Details  Name: Erin Mitchell MRN: [id number] Date of Birth: 1945-08-23 Referring Provider (PT): Caleen Jobs   Encounter Date: 02/24/2021   PT End of Session - 02/24/21 0929     Visit Number 1    Number of Visits 6    Date for PT Re-Evaluation 04/07/21    Authorization Type [employer]    Authorization - Visit Number 1    Progress Note Due on Visit 10    PT Start Time 0845    PT Stop Time 0925    PT Time Calculation (min) 40 min    Activity Tolerance Patient tolerated treatment well    Behavior During Therapy [facility] for tasks assessed/performed             Past Medical History:  Diagnosis Date   Atrial fibrillation ([place])    on Plavix   Cataract    Diabetes ([place])    Diabetic retinopathy ([place])    Exposure to Agent Orange    Hypertension    hypothyroidism    Hypothyroidism    Kidney disease     Past Surgical History:  Procedure Laterality Date   APPENDECTOMY     CESAREAN SECTION     CHOLECYSTECTOMY     HERNIA REPAIR     TOE AMPUTATION Right    2nd & 3rd   VENTRAL HERNIA REPAIR N/A 12/28/2020   Procedure: LAPAROSCOPIC VENTRAL [place];  Surgeon: Armandina Gemma, MD;  Location: [facility];  Service: General;  Laterality: N/A;    There were no vitals filed for this visit.    Subjective Assessment - 02/24/21 0840     Subjective Starting 2-3 weeks ago pt had some dizziness when laying in her bed which would last a few seconds and then resolve. 4 days ago she had dizziness that occured when she woke in the middle of the night and got out of bed and then reoccured when she got back into bed. She has had several more instances of dizziness occuring when making sudden turns. No dizziness in the past 2 days.    Pertinent History Rt toe amputations     Patient Stated Goals decrease occurences of dizziness    Currently in Pain? No/denies                [facility] PT Assessment - 02/24/21 0001       Assessment   Medical Diagnosis Dizziness, vertigo    Referring Provider (PT) Caleen Jobs    Onset Date/Surgical Date 02/03/21    Prior Therapy for Rt toe amputations      Precautions   Precautions Fall      Balance Screen   Has the patient fallen in the past 6 months Yes    How many times? 1    Has the patient had a decrease in activity level because of a fear of falling?  No    Is the patient reluctant to leave their home because of a fear of falling?  No      Home Environment   Additional Comments pt lives with her husband      Prior Function   Level of Independence Independent      Observation/Other Assessments   Focus on Therapeutic Outcomes (FOTO)  64      Ambulation/Gait   Assistive device Straight  cane                    Vestibular Assessment - 02/24/21 0001       Oculomotor Exam   Oculomotor Alignment Normal    Ocular ROM normal    Spontaneous Absent    Gaze-induced  Absent    Head shaking Horizontal Absent    Head Shaking Vertical Absent    Smooth Pursuits Intact    Saccades Intact      Oculomotor Exam-Fixation Suppressed    Left Head Impulse normal    Right Head Impulse normal      Vestibulo-Ocular Reflex   VOR 1 Head Only (x 1 viewing) WNL      Positional Testing   Dix-Hallpike Dix-Hallpike Right;Dix-Hallpike Left    Sidelying Test Sidelying Right;Sidelying Left    Horizontal Canal Testing Horizontal Canal Right;Horizontal Canal Left      Dix-Hallpike Right   Dix-Hallpike Right Duration 10 seconds    Dix-Hallpike Right Symptoms No nystagmus   c/o dizziness which onsets after 20 seconds     Dix-Hallpike Left   Dix-Hallpike Left Duration 10 seconds    Dix-Hallpike Left Symptoms No nystagmus   c/o dizziness     Sidelying Right   Sidelying Right Duration none    Sidelying Right  Symptoms No nystagmus      Horizontal Canal Right   Horizontal Canal Right Duration 10 seconds    Horizontal Canal Right Symptoms Other (comment)   no nystagmus, c/o dizziness onset after 20 seconds     Horizontal Canal Left   Horizontal Canal Left Duration none    Horizontal Canal Left Symptoms Normal      Positional Sensitivities   Supine to Right Side No dizziness    Up from Right Hallpike Mild dizziness    Up from Left Hallpike Mild dizziness                Objective measurements completed on examination: See above findings.        Vestibular Treatment/Exercise - 02/24/21 0001       Vestibular Treatment/Exercise   Vestibular Treatment Provided Habituation    Habituation Exercises --   supine horizontal head turns x 5, long sitting to supine x 5                   PT Education - 02/24/21 0928     Education Details PT POC and goals    Person(s) Educated Patient    Methods Explanation;Demonstration;Handout    Comprehension Returned demonstration;Verbalized understanding                 PT Long Term Goals - 02/24/21 1046       PT LONG TERM GOAL #1   Title Pt will be independent in HEP    Time 6    Period Weeks    Status New    Target Date 04/07/21      PT LONG TERM GOAL #2   Title Pt will reduce instances of dizzines to <= 1x/week    Time 6    Period Weeks    Status New    Target Date 04/07/21      PT LONG TERM GOAL #3   Title Pt will improve FOTO to >= 67 to demo improved functional mobility    Time 6    Period Weeks    Status New    Target Date 04/07/21  Plan - 02/24/21 0930     Clinical Impression Statement Pt is a 75 y/o female referred for dizziness and vertigo. She presents with dizziness onset with position changes supine <> sit and rolling Rt. Pt will benefit from skilled PT for habituation to decreased dizziness and reduce risk of falls    Personal Factors and Comorbidities Age;Time since  onset of injury/illness/exacerbation    Examination-Activity Limitations Transfers    Stability/Clinical Decision Making Evolving/Moderate complexity    Clinical Decision Making Moderate    Rehab Potential Good    PT Frequency 1x / week    PT Duration 6 weeks    PT Treatment/Interventions Neuromuscular re-education;Balance training;Therapeutic activities;Therapeutic exercise;Patient/family education;Vestibular    PT Next Visit Plan assess HEP, reassess positional testing    PT Home Exercise Plan [email or web address]    Consulted and Agree with Plan of Care Patient             Patient will benefit from skilled therapeutic intervention in order to improve the following deficits and impairments:  Dizziness  Visit Diagnosis: Dizziness and giddiness - Plan: PT plan of care cert/re-cert     Problem List Patient Active Problem List   Diagnosis Date Noted   History of incisional hernia repair 01/19/2021   Ventral incisional hernia 12/28/2020   Incisional hernia without obstruction or gangrene 12/17/2020   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   Nausea 11/04/2020   Abdominal pain 11/04/2020   AKI (acute kidney injury) ([place]) 11/04/2020   Leukocytosis 11/04/2020   Acute cystitis 11/04/2020   Hypertension    Diabetes ([place])    SBO (small bowel obstruction) ([place]) 11/03/2020   Type 2 diabetes mellitus with diabetic peripheral angiopathy and gangrene, without long-term current use of insulin ([place]) 10/08/2020   PAF (paroxysmal atrial fibrillation) ([place]) 10/08/2020   Acquired hypothyroidism 10/08/2020   PAD (peripheral artery disease) ([place]) 10/08/2020   Urinary frequency 10/08/2020   History of amputation of toe ([place]) 10/08/2020   Agent orange exposure 10/08/2020  PHYSICAL THERAPY DISCHARGE SUMMARY  Visits from Start of Care: 1  Current functional level related to goals / functional outcomes: No change, eval only   Remaining deficits: See above   Education / Equipment: HEP   Patient  agrees to discharge. Patient goals were not met. Patient is being discharged due to not returning since the last visit. Isabelle Course, PT,DPT11/02/224:13 PM   Yarima Penman, PT 02/24/2021, 10:53 AM  [facility] [place], [place], [address] Phone: [phone]   Fax:  [phone]  Name: Kenyonna Micek MRN: [id number] Date of Birth: 04-16-1946

## 2021-02-24 NOTE — Patient Instructions (Signed)
Access Code: [email or web address] URL: [email or web address] Date: 02/24/2021 Prepared by: Reggy Eye  Exercises Supine to Long Sitting Vestibular Habituation - 3 x daily - 7 x weekly - 1 sets - 5 reps Supine Cervical Rotation AROM on Pillow - 1 x daily - 7 x weekly - 1 sets - 5 reps

## 2021-02-25 ENCOUNTER — Encounter (HOSPITAL_COMMUNITY): Payer: Self-pay | Admitting: Radiology

## 2021-03-01 ENCOUNTER — Other Ambulatory Visit: Payer: Self-pay | Admitting: Family Medicine

## 2021-03-01 DIAGNOSIS — E1152 Type 2 diabetes mellitus with diabetic peripheral angiopathy with gangrene: Secondary | ICD-10-CM

## 2021-03-08 ENCOUNTER — Encounter: Payer: Self-pay | Admitting: Family Medicine

## 2021-03-08 ENCOUNTER — Ambulatory Visit (INDEPENDENT_AMBULATORY_CARE_PROVIDER_SITE_OTHER): Payer: Medicare HMO | Admitting: Family Medicine

## 2021-03-08 ENCOUNTER — Other Ambulatory Visit: Payer: Self-pay

## 2021-03-08 VITALS — BP 152/79 | HR 83 | Wt 151.1 lb

## 2021-03-08 DIAGNOSIS — R42 Dizziness and giddiness: Secondary | ICD-10-CM

## 2021-03-08 DIAGNOSIS — I10 Essential (primary) hypertension: Secondary | ICD-10-CM

## 2021-03-08 NOTE — Progress Notes (Signed)
Established Patient Office Visit  Subjective:  Patient ID: Erin Mitchell, female    DOB: 1945/10/26  Age: 75 y.o. MRN: [id number]  CC:  Chief Complaint  Patient presents with   Follow-up    HPI Erin Mitchell [facility] presents for follow-up on BP and vertigo  Vertigo: Patient reports that after doing Dix-Hallpike at last office appointment, she did not have any more major dizzy/spinning episodes. States she went to PT and learned Epley maneuver and continues to be doing well with no recurrent episodes. She never had to take the Meclizine.   Hypertension: At last visit, patient's BP was elevated. Requested that she keep a log at home. She reports that she has been checking her BP every day and averages around 130-140/70-80. Reports "white coat syndrome." She denies any headaches, vision changes, chest pains, shortness of breath, edema. She does not want any medications changes since she is usually fine at home.    Patient would also like to let me know that she tends to have loose stools in the morning and if she has morning appointments/errands, she tends to have some incontinence riding in the car. Reports a past provider told her the vibration in the car may be triggering the BM. States she had a full GI workup with hernia repair this past summer and is not interested in seeing GI at this time. Reports if she takes an imodium before getting in the car she is usually fine. This only happens in mornings that she is riding in the car, never occurs later in the day, and not happening on days she stays at home. Declines any further workup at this time. Will continue with occasional imodium and let us know if anything changes.        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   VENTRAL HERNIA REPAIR N/A 12/28/2020   Procedure: LAPAROSCOPIC VENTRAL [place];  Surgeon: Armandina Gemma, MD;  Location: [facility];  Service: General;  Laterality: N/A;    Family History  Problem Relation Age of Onset   Hypertension Father    Diabetes Brother    Emphysema Mother     Social History   Socioeconomic History   Marital status: Married    Spouse name: Not on file   Number of children: 7   Years of education: Not on file   Highest education level: Not on file  Occupational History   Occupation: Retired  Tobacco Use   Smoking status: Former    Packs/day: 0.25    Years: 1.00    Pack years: 0.25    Types: Cigarettes    Quit date: 06/06/1970    Years since quitting: 50.7   Smokeless tobacco: Never  Vaping Use   Vaping Use: Never used  Substance and Sexual Activity   Alcohol use: Never   Drug use: Never   Sexual activity: Not on file  Other Topics Concern   Not on file  Social History Narrative   Not on file   Social Determinants of Health   Financial Resource Strain: Not on file  Food Insecurity: Not on file  Transportation Needs: Not on file  Physical Activity: Not on file  Stress: Not on file  Social Connections: Not on file  Intimate Partner Violence: Not on file    Outpatient Medications Prior to Visit  Medication Sig Dispense Refill   Alcohol Swabs (DROPSAFE ALCOHOL PREP) 70 % PADS USE AS DIRECTED. 300 each 2   Blood Glucose Monitoring Suppl (TRUE METRIX METER) w/Device KIT Use as directed to test blood glucose daily 1 kit 0   clopidogrel (PLAVIX) 75 MG tablet TAKE 1 TABLET BY MOUTH ONCE DAILY (Patient taking differently: Take 75 mg by mouth daily.) 90 tablet 3   glucose blood (TRUE METRIX BLOOD GLUCOSE TEST) test strip Use as instructed 100 each 12    loperamide (IMODIUM A-D) 2 MG tablet Take 2-4 mg by mouth 4 (four) times daily as needed for diarrhea or loose stools.     meclizine (ANTIVERT) 25 MG tablet Take 1 tablet (25 mg total) by mouth 3 (three) times daily as needed for dizziness. 30 tablet 0   metFORMIN (GLUCOPHAGE) 500 MG tablet TAKE 1 TABLET TWICE DAILY WITH MEALS 180 tablet 3   metoprolol tartrate (LOPRESSOR) 25 MG tablet Take 1 tablet (25 mg total) by mouth 2 (two) times daily with a meal.     NP THYROID 90 MG tablet TAKE 1 TABLET (90 MG TOTAL) BY MOUTH DAILY BEFORE BREAKFAST. 30 tablet 2   PAPAYA ENZYME PO Take 2 capsules by mouth daily as needed (heartburn).     Probiotic Product (ALIGN) 4 MG CAPS Take 4 mg by mouth 2 (two) times daily.     rosuvastatin (CRESTOR) 40 MG tablet TAKE 1 TABLET BY MOUTH ONCE DAILY (Patient taking differently: Take 40 mg by mouth at bedtime.) 90 tablet 3   saccharomyces boulardii (FLORASTOR) 250 MG capsule Take 1 capsule (250 mg total) by mouth 2 (two) times daily. 60 capsule 0   TRUEplus Lancets 33G MISC Use as directed daily. 100 each 3   losartan (COZAAR) 50 MG tablet Take 1 tablet (50 mg total) by mouth daily. 90 tablet 3   traMADol (ULTRAM) 50 MG tablet Take 1-2 tablets (50-100 mg total) by mouth every 6 (six) hours as needed for moderate pain. 15 tablet 0   No facility-administered medications prior to visit.    No Known Allergies  ROS Review of Systems All review of systems negative except what is listed in the HPI    Objective:    Physical Exam Vitals reviewed.  Constitutional:      Appearance: Normal appearance.  Cardiovascular:     Rate and Rhythm: Normal rate and regular rhythm.     Pulses: Normal pulses.     Heart sounds: Normal heart sounds.  Pulmonary:     Effort: Pulmonary effort is normal.     Breath sounds: Normal breath sounds.  Musculoskeletal:     Right lower leg: No edema.     Left lower leg: No edema.  Neurological:     Mental Status: She is alert and oriented  to person, place, and time.  Psychiatric:        Mood and Affect: Mood normal.        Behavior: Behavior normal.        Thought Content: Thought content normal.        Judgment: Judgment normal.  BP (!) 152/79 (BP Location: Left Arm, Patient Position: Sitting, Cuff Size: Normal)   Pulse 83   Wt 151 lb 1.9 oz (68.5 kg)   BMI 25.94 kg/m  Wt Readings from Last 3 Encounters:  03/08/21 151 lb 1.9 oz (68.5 kg)  01/08/21 152 lb (68.9 kg)  12/28/20 150 lb (68 kg)     Health Maintenance Due  Topic Date Due   OPHTHALMOLOGY EXAM  Never done   URINE MICROALBUMIN  Never done   Hepatitis C Screening  Never done    There are no preventive care reminders to display for this patient.  Lab Results  Component Value Date   TSH 2.65 10/08/2020   Lab Results  Component Value Date   WBC 7.6 12/02/2020   HGB 10.5 (L) 12/02/2020   HCT 32.0 (L) 12/02/2020   MCV 87.9 12/02/2020   PLT 221 12/02/2020   Lab Results  Component Value Date   NA 138 12/02/2020   K 3.9 12/02/2020   CO2 22 12/02/2020   GLUCOSE 145 (H) 12/02/2020   BUN 18 12/02/2020   CREATININE 0.69 12/02/2020   BILITOT 0.3 02/18/2021   ALKPHOS 87 02/18/2021   AST 16 02/18/2021   ALT 12 02/18/2021   PROT 6.3 02/18/2021   ALBUMIN 4.1 02/18/2021   CALCIUM 8.2 (L) 12/02/2020   ANIONGAP 4 (L) 12/02/2020   EGFR 81 11/27/2020   Lab Results  Component Value Date   CHOL 135 02/18/2021   Lab Results  Component Value Date   HDL 51 02/18/2021   Lab Results  Component Value Date   LDLCALC 59 02/18/2021   Lab Results  Component Value Date   TRIG 145 02/18/2021   Lab Results  Component Value Date   CHOLHDL 2.6 02/18/2021   Lab Results  Component Value Date   HGBA1C 6.7 (H) 12/28/2020      Assessment & Plan:   1. Vertigo Resolved. Continue with Epley maneuver at home if symptoms return.  2. Primary hypertension Remains elevated today, but patient hesitant to make any changes given good readings at home.  Discussed options with patient and she agrees to return within a week and bring her home BP cuff for a nurse visit to ensure her home cuff is accurate.   Patient aware of signs/symptoms requiring further/urgent evaluation.   Follow-up: Return in about 1 week (around 03/15/2021) for BP check nurse visit with home BP cuff.    Terrilyn Saver, NP

## 2021-03-10 ENCOUNTER — Encounter (INDEPENDENT_AMBULATORY_CARE_PROVIDER_SITE_OTHER): Payer: Medicare HMO | Admitting: Ophthalmology

## 2021-03-16 ENCOUNTER — Ambulatory Visit (INDEPENDENT_AMBULATORY_CARE_PROVIDER_SITE_OTHER): Payer: Medicare HMO | Admitting: Family Medicine

## 2021-03-16 VITALS — BP 138/72 | HR 70

## 2021-03-16 DIAGNOSIS — E1152 Type 2 diabetes mellitus with diabetic peripheral angiopathy with gangrene: Secondary | ICD-10-CM | POA: Diagnosis not present

## 2021-03-16 DIAGNOSIS — I10 Essential (primary) hypertension: Secondary | ICD-10-CM

## 2021-03-16 MED ORDER — METFORMIN HCL 500 MG PO TABS: 1000.0000 mg | ORAL_TABLET | Freq: Two times a day (BID) | ORAL | 3 refills | Status: DC

## 2021-03-16 NOTE — Progress Notes (Signed)
Medical screening examination/treatment was performed by qualified clinical staff member and as supervising physician I was immediately available for consultation/collaboration. I have reviewed documentation and agree with assessment and plan.  Esco Joslyn, DO  

## 2021-03-16 NOTE — Progress Notes (Signed)
Established Patient Office Visit  Subjective:  Patient ID: Erin Mitchell, female    DOB: 1945-07-09  Age: 75 y.o. MRN: [id number]  CC:  Chief Complaint  Patient presents with   Hypertension    HPI Erin Mitchell presents for blood pressure check. Blood pressure reading manually was 162/86 and home monitor 157/87. Denies chest pain or shortness of breath.   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   VENTRAL HERNIA REPAIR N/A 12/28/2020   Procedure: LAPAROSCOPIC VENTRAL [place];  Surgeon: Armandina Gemma, MD;  Location: [facility];  Service: General;  Laterality: N/A;    Family History  Problem Relation Age of Onset   Hypertension Father    Diabetes Brother    Emphysema Mother     Social History   Socioeconomic History   Marital status: Married    Spouse name: Not on file   Number of children: 7   Years of education: Not on file   Highest education level: Not on file  Occupational History   Occupation: Retired  Tobacco Use   Smoking status: Former    Packs/day: 0.25    Years: 1.00    Pack years: 0.25    Types: Cigarettes    Quit date: 06/06/1970    Years since quitting: 50.8   Smokeless tobacco: Never  Vaping Use   Vaping Use: Never used  Substance and Sexual Activity   Alcohol use: Never   Drug use: Never   Sexual activity: Not on file  Other Topics Concern   Not on file  Social History Narrative   Not on file   Social Determinants of Health    Financial Resource Strain: Not on file  Food Insecurity: Not on file  Transportation Needs: Not on file  Physical Activity: Not on file  Stress: Not on file  Social Connections: Not on file  Intimate Partner Violence: Not on file    Outpatient Medications Prior to Visit  Medication Sig Dispense Refill   Alcohol Swabs (DROPSAFE ALCOHOL PREP) 70 % PADS USE AS DIRECTED. 300 each 2   Blood Glucose Monitoring Suppl (TRUE METRIX METER) w/Device KIT Use as directed to test blood glucose daily 1 kit 0   clopidogrel (PLAVIX) 75 MG tablet TAKE 1 TABLET BY MOUTH ONCE DAILY (Patient taking differently: Take 75 mg by mouth daily.) 90 tablet 3   glucose blood (TRUE METRIX BLOOD GLUCOSE TEST) test strip Use as instructed 100 each 12   loperamide (IMODIUM A-D) 2 MG tablet Take 2-4 mg by mouth 4 (four) times daily as needed for diarrhea or loose stools.     meclizine (ANTIVERT) 25 MG tablet Take 1 tablet (25 mg total) by mouth 3 (three) times daily as needed for dizziness. 30 tablet 0   metoprolol tartrate (LOPRESSOR) 25 MG tablet Take 1 tablet (25 mg total) by mouth  2 (two) times daily with a meal.     NP THYROID 90 MG tablet TAKE 1 TABLET (90 MG TOTAL) BY MOUTH DAILY BEFORE BREAKFAST. 30 tablet 2   PAPAYA ENZYME PO Take 2 capsules by mouth daily as needed (heartburn).     Probiotic Product (ALIGN) 4 MG CAPS Take 4 mg by mouth 2 (two) times daily.     rosuvastatin (CRESTOR) 40 MG tablet TAKE 1 TABLET BY MOUTH ONCE DAILY (Patient taking differently: Take 40 mg by mouth at bedtime.) 90 tablet 3   saccharomyces boulardii (FLORASTOR) 250 MG capsule Take 1 capsule (250 mg total) by mouth 2 (two) times daily. 60 capsule 0   TRUEplus Lancets 33G MISC Use as directed daily. 100 each 3   metFORMIN (GLUCOPHAGE) 500 MG tablet TAKE 1 TABLET TWICE DAILY WITH MEALS (Patient taking differently: Take 1,000 mg by mouth 2 (two) times daily with a meal.) 180 tablet 3   losartan (COZAAR) 50 MG tablet Take 1 tablet (50 mg  total) by mouth daily. 90 tablet 3   traMADol (ULTRAM) 50 MG tablet Take 1-2 tablets (50-100 mg total) by mouth every 6 (six) hours as needed for moderate pain. 15 tablet 0   No facility-administered medications prior to visit.    No Known Allergies  ROS Review of Systems    Objective:    Physical Exam  BP 138/72   Pulse 70   SpO2 100%  Wt Readings from Last 3 Encounters:  03/08/21 151 lb 1.9 oz (68.5 kg)  01/08/21 152 lb (68.9 kg)  12/28/20 150 lb (68 kg)     Health Maintenance Due  Topic Date Due   OPHTHALMOLOGY EXAM  Never done   URINE MICROALBUMIN  Never done   Hepatitis C Screening  Never done    There are no preventive care reminders to display for this patient.  Lab Results  Component Value Date   TSH 2.65 10/08/2020   Lab Results  Component Value Date   WBC 7.6 12/02/2020   HGB 10.5 (L) 12/02/2020   HCT 32.0 (L) 12/02/2020   MCV 87.9 12/02/2020   PLT 221 12/02/2020   Lab Results  Component Value Date   NA 138 12/02/2020   K 3.9 12/02/2020   CO2 22 12/02/2020   GLUCOSE 145 (H) 12/02/2020   BUN 18 12/02/2020   CREATININE 0.69 12/02/2020   BILITOT 0.3 02/18/2021   ALKPHOS 87 02/18/2021   AST 16 02/18/2021   ALT 12 02/18/2021   PROT 6.3 02/18/2021   ALBUMIN 4.1 02/18/2021   CALCIUM 8.2 (L) 12/02/2020   ANIONGAP 4 (L) 12/02/2020   EGFR 81 11/27/2020   Lab Results  Component Value Date   CHOL 135 02/18/2021   Lab Results  Component Value Date   HDL 51 02/18/2021   Lab Results  Component Value Date   LDLCALC 59 02/18/2021   Lab Results  Component Value Date   TRIG 145 02/18/2021   Lab Results  Component Value Date   CHOLHDL 2.6 02/18/2021   Lab Results  Component Value Date   HGBA1C 6.7 (H) 12/28/2020      Assessment & Plan:  Hypertension - Second check of blood pressure within normal limits. Patient advised to continue to take medications as directed. Follow up with Dr Zigmund Daniel.    Problem List Items Addressed This Visit      Type 2 diabetes mellitus with diabetic peripheral angiopathy and gangrene, without long-term current use of insulin (HCC)   Relevant Medications  metFORMIN (GLUCOPHAGE) 500 MG tablet   Hypertension - Primary    Meds ordered this encounter  Medications   metFORMIN (GLUCOPHAGE) 500 MG tablet    Sig: Take 2 tablets (1,000 mg total) by mouth 2 (two) times daily with a meal.    Dispense:  360 tablet    Refill:  3    Follow-up: Return in about 4 weeks (around 04/13/2021) for chronic problems with Dr Zigmund Daniel. Durene Romans, [place], [place]

## 2021-03-17 ENCOUNTER — Encounter: Payer: Medicare HMO | Admitting: Physical Therapy

## 2021-03-24 ENCOUNTER — Encounter (INDEPENDENT_AMBULATORY_CARE_PROVIDER_SITE_OTHER): Payer: Self-pay

## 2021-03-24 ENCOUNTER — Encounter (INDEPENDENT_AMBULATORY_CARE_PROVIDER_SITE_OTHER): Payer: Medicare HMO | Admitting: Ophthalmology

## 2021-03-24 DIAGNOSIS — I10 Essential (primary) hypertension: Secondary | ICD-10-CM

## 2021-03-24 DIAGNOSIS — H35033 Hypertensive retinopathy, bilateral: Secondary | ICD-10-CM

## 2021-03-24 DIAGNOSIS — H25813 Combined forms of age-related cataract, bilateral: Secondary | ICD-10-CM

## 2021-03-24 DIAGNOSIS — E113313 Type 2 diabetes mellitus with moderate nonproliferative diabetic retinopathy with macular edema, bilateral: Secondary | ICD-10-CM

## 2021-03-24 DIAGNOSIS — H3581 Retinal edema: Secondary | ICD-10-CM

## 2021-04-09 ENCOUNTER — Ambulatory Visit: Payer: Medicare HMO | Admitting: Family Medicine

## 2021-04-14 ENCOUNTER — Encounter: Payer: Self-pay | Admitting: Family Medicine

## 2021-04-14 ENCOUNTER — Ambulatory Visit (INDEPENDENT_AMBULATORY_CARE_PROVIDER_SITE_OTHER): Payer: Medicare HMO | Admitting: Family Medicine

## 2021-04-14 ENCOUNTER — Other Ambulatory Visit: Payer: Self-pay

## 2021-04-14 VITALS — BP 170/70 | HR 70 | Ht 64.0 in | Wt 153.0 lb

## 2021-04-14 DIAGNOSIS — E1152 Type 2 diabetes mellitus with diabetic peripheral angiopathy with gangrene: Secondary | ICD-10-CM | POA: Diagnosis not present

## 2021-04-14 DIAGNOSIS — E785 Hyperlipidemia, unspecified: Secondary | ICD-10-CM | POA: Diagnosis not present

## 2021-04-14 DIAGNOSIS — Z23 Encounter for immunization: Secondary | ICD-10-CM | POA: Diagnosis not present

## 2021-04-14 DIAGNOSIS — E1169 Type 2 diabetes mellitus with other specified complication: Secondary | ICD-10-CM | POA: Insufficient documentation

## 2021-04-14 DIAGNOSIS — I480 Paroxysmal atrial fibrillation: Secondary | ICD-10-CM

## 2021-04-14 DIAGNOSIS — E039 Hypothyroidism, unspecified: Secondary | ICD-10-CM

## 2021-04-14 DIAGNOSIS — I10 Essential (primary) hypertension: Secondary | ICD-10-CM

## 2021-04-14 LAB — POCT GLYCOSYLATED HEMOGLOBIN (HGB A1C): HbA1c, POC (controlled diabetic range): 6.5 % (ref 0.0–7.0)

## 2021-04-14 NOTE — Assessment & Plan Note (Signed)
Her diabetes remains well controlled.  She will continue metformin at current strength.

## 2021-04-14 NOTE — Patient Instructions (Signed)
Great to see you today! ?Continue current medications.  ?See me again in about 6 months.  ?

## 2021-04-14 NOTE — Progress Notes (Signed)
Erin Mitchell [facility] - 75 y.o. female MRN [id number]  Date of birth: [birth year]  Subjective Chief Complaint  Patient presents with   Diabetes    HPI Erin Mitchell is a 75 year old female here today for follow-up visit.  She reports that overall she is feeling well.  She continues to do well with metformin for management of her diabetes.  She has not noted any significant side effects related to this.  Tolerating rosuvastatin for management of associated hyperlipidemia.  Blood pressure elevated here today in clinic.  She did bring her home blood pressure cuff and reading on her home cuff is similar to what we are seeing in the office.  Her home readings with her blood pressure cuff are well controlled at home ranging from 115-130/70-80.  She denies any symptoms related to hypertension including chest pain, shortness of breath, palpitations, headache or vision changes.  She is tolerating losartan and metoprolol well.  She continues to feel well with current dose of NP thyroid.  No symptoms of hypo or hyperthyroidism.  She does have some intermittent episodes of vertigo.  Most recent episode occurred when she had COVID.  This is well controlled with meclizine as needed.  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   VENTRAL HERNIA REPAIR N/A 12/28/2020   Procedure: LAPAROSCOPIC VENTRAL INCISIONAL HERNIA REPAIR WITH MESH;  Surgeon: Darnell Level, MD;  Location: [facility];  Service: General;  Laterality: N/A;    Social History    Socioeconomic History   Marital status: Married    Spouse name: Not on file   Number of children: 7   Years of education: Not on file   Highest education level: Not on file  Occupational History   Occupation: Retired  Tobacco Use   Smoking status: Former    Packs/day: 0.25    Years: 1.00    Pack years: 0.25    Types: Cigarettes    Quit date: 06/06/1970    Years since quitting: 50.8   Smokeless tobacco: Never  Vaping Use   Vaping Use: Never used  Substance and Sexual Activity   Alcohol use: Never   Drug use: Never   Sexual activity: Not on file  Other Topics Concern   Not on file  Social History Narrative   Not on file   Social Determinants of Health   Financial Resource Strain: Not on file  Food Insecurity: Not on file  Transportation Needs: Not on file  Physical Activity: Not on file  Stress: Not on file  Social Connections: Not on file    Family History  Problem Relation Age of Onset   Hypertension Father    Diabetes Brother    Emphysema Mother     Health Maintenance  Topic Date Due   OPHTHALMOLOGY EXAM  Never done   URINE MICROALBUMIN  Never done   Hepatitis C Screening  Never done   COVID-19 Vaccine (3 - Booster for Pfizer series) 09/25/2019   Pneumonia Vaccine 8+ Years old (2 - PPSV23 if available, else PCV20) 01/22/2020   Zoster Vaccines- Shingrix (1 of 2) 06/08/2021 (Originally 04/12/1998)   HEMOGLOBIN A1C  10/12/2021   FOOT EXAM  10/16/2021   TETANUS/TDAP  08/15/2029   INFLUENZA VACCINE  Completed   HPV VACCINES  Aged Out   MAMMOGRAM  Discontinued   DEXA SCAN  Discontinued   COLONOSCOPY (Pts 45-67yrs Insurance coverage will need to be confirmed)  Discontinued     ----------------------------------------------------------------------------------------------------------------------------------------------------------------------------------------------------------------- Physical Exam BP (!) 170/70 (BP Location: Left Arm, Patient Position:  Sitting, Cuff Size: Normal)   Pulse 70   Ht 5\' 4"  (1.626 m)   Wt 153 lb (69.4 kg)   SpO2 100%   BMI 26.26 kg/m   Physical Exam Constitutional:      Appearance: Normal appearance.  HENT:     Head: Normocephalic and atraumatic.  Eyes:     General: No scleral icterus. Cardiovascular:     Rate and Rhythm: Normal rate and regular rhythm.  Pulmonary:     Effort: Pulmonary effort is normal.     Breath sounds: Normal breath sounds.  Musculoskeletal:     Cervical back: Neck supple.  Neurological:     General: No focal deficit present.     Mental Status: She is alert.  Psychiatric:        Mood and Affect: Mood normal.        Behavior: Behavior normal.    ------------------------------------------------------------------------------------------------------------------------------------------------------------------------------------------------------------------- Assessment and Plan  Hypertension Blood pressure is elevated today in clinic.  Readings on her home cuff have been well controlled.  She will continue current medications for management of hypertension.  PAF (paroxysmal atrial fibrillation) (HCC) Management per cardiology.  She has not had any recurrent episodes.  She continues on metoprolol.  Type 2 diabetes mellitus with diabetic peripheral angiopathy and gangrene, without long-term current use of insulin (HCC) Her diabetes remains well controlled.  She will continue metformin at current strength.  Acquired hypothyroidism She is doing well with NP thyroid.  Update TSH today.  Hyperlipidemia associated with type 2 diabetes mellitus (HCC) Doing well with rosuvastatin.  Continue current strength. Lab Results  Component Value Date   LDLCALC 59 02/18/2021    No orders of the defined types were placed in this encounter.   Return in about 6 months (around 10/12/2021) for HTN/DM.    This visit occurred during the SARS-CoV-2 public health emergency.  Safety protocols  were in place, including screening questions prior to the visit, additional usage of staff PPE, and extensive cleaning of exam room while observing appropriate contact time as indicated for disinfecting solutions.

## 2021-04-14 NOTE — Assessment & Plan Note (Signed)
Blood pressure is elevated today in clinic.  Readings on her home cuff have been well controlled.  She will continue current medications for management of hypertension.

## 2021-04-14 NOTE — Assessment & Plan Note (Signed)
She is doing well with NP thyroid.  Update TSH today.

## 2021-04-14 NOTE — Assessment & Plan Note (Signed)
Doing well with rosuvastatin.  Continue current strength. Lab Results  Component Value Date   LDLCALC 59 02/18/2021

## 2021-04-14 NOTE — Assessment & Plan Note (Signed)
Management per cardiology.  She has not had any recurrent episodes.  She continues on metoprolol.

## 2021-04-14 NOTE — Progress Notes (Signed)
[facility] Note  04/19/2021     CHIEF COMPLAINT Patient presents for Retina Follow Up   HISTORY OF PRESENT ILLNESS: Erin Mitchell is a 75 y.o. female who presents to the clinic today for:   HPI     Retina Follow Up   Patient presents with  Diabetic Retinopathy.  In both eyes.  This started months ago.  Severity is moderate.  Duration of 9.5 weeks.  Since onset it is stable.  I, the attending physician,  performed the HPI with the patient and updated documentation appropriately.        Comments   75 y/o female pt here for 9.5 wk (delayed 6 wk) f/u for mod NPDR w/DME OU.  F/u delayed due to pt having covid around scheduled time of original f/u.  No change in [place] OU.  Denies pain, FOL, floaters.  No gtts.  BS 124 this a.m.  A1C 6.5 4 days ago.      Last edited by Bernarda Caffey, MD on 04/19/2021 10:48 PM.    Appt delayed to f/u due to pt having covid around time of last scheduled f/u.  Referring physician: Luetta Nutting, DO [address]  [address] [place],  [place] [address]  HISTORICAL INFORMATION:   Selected notes from the MEDICAL RECORD NUMBER Referred by Dr. Luetta Nutting for DM exam LEE:  Ocular Hx- PMH- DM, HTN, afib, kidney disease, last a1c was 6.8 on 05.05.22    CURRENT MEDICATIONS: No current outpatient medications on file. (Ophthalmic Drugs)   No current facility-administered medications for this visit. (Ophthalmic Drugs)   Current Outpatient Medications (Other)  Medication Sig   Alcohol Swabs (DROPSAFE ALCOHOL PREP) 70 % PADS USE AS DIRECTED.   Blood Glucose Monitoring Suppl (TRUE METRIX METER) w/Device KIT Use as directed to test blood glucose daily   clopidogrel (PLAVIX) 75 MG tablet TAKE 1 TABLET BY MOUTH ONCE DAILY (Patient taking differently: Take 75 mg by mouth daily.)   glucose blood (TRUE METRIX BLOOD GLUCOSE TEST) test strip Use as instructed   loperamide (IMODIUM A-D) 2 MG tablet Take 2-4 mg by  mouth 4 (four) times daily as needed for diarrhea or loose stools.   losartan (COZAAR) 50 MG tablet Take 1 tablet (50 mg total) by mouth daily.   meclizine (ANTIVERT) 25 MG tablet Take 1 tablet (25 mg total) by mouth 3 (three) times daily as needed for dizziness.   metFORMIN (GLUCOPHAGE) 500 MG tablet Take 2 tablets (1,000 mg total) by mouth 2 (two) times daily with a meal.   metoprolol tartrate (LOPRESSOR) 25 MG tablet Take 1 tablet (25 mg total) by mouth 2 (two) times daily with a meal.   NP THYROID 90 MG tablet TAKE 1 TABLET (90 MG TOTAL) BY MOUTH DAILY BEFORE BREAKFAST.   PAPAYA ENZYME PO Take 2 capsules by mouth daily as needed (heartburn).   Probiotic Product (ALIGN) 4 MG CAPS Take 4 mg by mouth 2 (two) times daily.   rosuvastatin (CRESTOR) 40 MG tablet TAKE 1 TABLET BY MOUTH ONCE DAILY (Patient taking differently: Take 40 mg by mouth at bedtime.)   saccharomyces boulardii (FLORASTOR) 250 MG capsule Take 1 capsule (250 mg total) by mouth 2 (two) times daily.   TRUEplus Lancets 33G MISC Use as directed daily.   No current facility-administered medications for this visit. (Other)   REVIEW OF SYSTEMS: ROS   Positive for: Genitourinary, Endocrine, Cardiovascular, Eyes Negative for: Constitutional, Gastrointestinal, Neurological, Skin, Musculoskeletal,  HENT, Respiratory, Psychiatric, Allergic/Imm, Heme/Lymph Last edited by Matthew Folks, COA on 04/19/2021  9:39 AM.      ALLERGIES No Known Allergies  PAST MEDICAL HISTORY Past Medical History:  Diagnosis Date   Atrial fibrillation (HCC)    on Plavix   Cataract    Diabetes ([place])    Diabetic retinopathy ([place])    Exposure to Agent Orange    Hypertension    Hypertensive retinopathy    hypothyroidism    Hypothyroidism    Kidney disease    Past Surgical History:  Procedure Laterality Date   APPENDECTOMY     CESAREAN SECTION     CHOLECYSTECTOMY     HERNIA REPAIR     TOE AMPUTATION Right    2nd & 3rd   VENTRAL HERNIA REPAIR  N/A 12/28/2020   Procedure: LAPAROSCOPIC VENTRAL [place];  Surgeon: Armandina Gemma, MD;  Location: [facility];  Service: General;  Laterality: N/A;   FAMILY HISTORY Family History  Problem Relation Age of Onset   Hypertension Father    Diabetes Brother    Emphysema Mother     SOCIAL HISTORY Social History   Tobacco Use   Smoking status: Former    Packs/day: 0.25    Years: 1.00    Pack years: 0.25    Types: Cigarettes    Quit date: 06/06/1970    Years since quitting: 50.9   Smokeless tobacco: Never  Vaping Use   Vaping Use: Never used  Substance Use Topics   Alcohol use: Never   Drug use: Never       OPHTHALMIC EXAM: Base Eye Exam     Visual Acuity (Snellen - Linear)       Right Left   Dist cc 20/40 - 20/20   Dist ph cc NI     Correction: Glasses         Tonometry (Tonopen, 9:43 AM)       Right Left   Pressure 10 12         Pupils       Dark Light Shape React APD   Right 2 1 Round Minimal None   Left 2 1 Round Minimal None         Visual Fields (Counting fingers)       Left Right    Full Full         Extraocular Movement       Right Left    Full, Ortho Full, Ortho         Neuro/Psych     Oriented x3: Yes   Mood/Affect: Normal         Dilation     Both eyes: 1.0% Mydriacyl, 2.5% Phenylephrine @ 9:43 AM           Slit Lamp and Fundus Exam     Slit Lamp Exam       Right Left   Lids/Lashes Dermatochalasis - upper lid, Meibomian gland dysfunction Dermatochalasis - upper lid, Meibomian gland dysfunction   Conjunctiva/Sclera White and quiet White and quiet   Cornea arcus, trace PEE arcus, trace PEE   Anterior Chamber deep, clear, narrow angles deep, clear, narrow angles   Iris Round and moderately dilated, mild anterior bowing Round and moderately dilated, mild anterior bowing   Lens 2-3+ Nuclear sclerosis with brunescence, 2+ Cortical cataract 2-3+ Nuclear sclerosis with brunescence, 2+ Cortical  cataract   Vitreous Vitreous syneresis, Posterior vitreous detachment Vitreous syneresis, Posterior vitreous detachment  Fundus Exam       Right Left   Disc Pink and Sharp, +cupping Pink and Sharp, +cupping   C/D Ratio 0.7 0.65   Macula Blunted foveal reflex, central edema, RPE mottling, cluster of MA temporal to fovea Flat, Blunted foveal reflex, RPE mottling, scatterd MA, non-central cystic changes temp mac--persistent   Vessels attenuated, Tortuous attenuated, Tortuous   Periphery Attached, mild reticular degeneration, mild MA Attached, mild reticular degeneration, scattered MA, cluster DBH inf to disc            IMAGING AND PROCEDURES  Imaging and Procedures for 04/19/2021  OCT, Retina - OU - Both Eyes       Right Eye Quality was good. Central Foveal Thickness: 462. Progression has improved. Findings include abnormal foveal contour, intraretinal fluid, no SRF, vitreomacular adhesion (Mild interval improvement in IRF. Persistent large central cyst.).   Left Eye Quality was good. Central Foveal Thickness: 255. Progression has been stable. Findings include abnormal foveal contour, no SRF, intraretinal fluid, vitreomacular adhesion (Non-central cystic changes temporal and inferior macula caught on widefield -- stable).   Notes *Images captured and stored on drive  Diagnosis / Impression:  OD: +central DME, Mild interval improvement in IRF.  Persistent large central cyst. OS: Non-central cystic changes temporal and inferior macula caught on widefield -- stable  Clinical management:  See below  Abbreviations: NFP - Normal foveal profile. CME - cystoid macular edema. PED - pigment epithelial detachment. IRF - intraretinal fluid. SRF - subretinal fluid. EZ - ellipsoid zone. ERM - epiretinal membrane. ORA - outer retinal atrophy. ORT - outer retinal tubulation. SRHM - subretinal hyper-reflective material. IRHM - intraretinal hyper-reflective material      Fluorescein  Angiography Optos (Transit OD)       Right Eye Progression has no prior data. Early phase findings include delayed filling, microaneurysm, vascular perfusion defect. Mid/Late phase findings include delayed filling, leakage, vascular perfusion defect, microaneurysm (Focal hyperflourescence IT to fovea; no NV).   Left Eye Progression has no prior data. Early phase findings include microaneurysm, vascular perfusion defect. Mid/Late phase findings include vascular perfusion defect, leakage, microaneurysm (No NV).   Notes **Images stored on drive**  Impression: Mod NPDR OU Late leaking MA OU No NV OU OD: Focal hyperflourescence IT to fovea     Intravitreal Injection, Pharmacologic Agent - OD - Right Eye       Time Out 04/19/2021. 11:23 AM. Confirmed correct patient, procedure, site, and patient consented.   Anesthesia Topical anesthesia was used. Anesthetic medications included Lidocaine 2%, Proparacaine 0.5%.   Procedure Preparation included 5% betadine to ocular surface. A (32g) needle was used.   Injection: 2 mg aflibercept 2 MG/0.05ML   Route: Intravitreal, Site: Right Eye   NDC: [id number], Lot: [id number], Expiration date: 03/05/2022, Waste: 0.05 mL   Post-op Post injection exam found visual acuity of at least counting fingers. The patient tolerated the procedure well. There were no complications. The patient received written and verbal post procedure care education. Post injection medications were not given.   Notes             ASSESSMENT/PLAN:    ICD-10-CM   1. Moderate nonproliferative diabetic retinopathy of both eyes with macular edema associated with type 2 diabetes mellitus (HCC)  I50.2774 Intravitreal Injection, Pharmacologic Agent - OD - Right Eye    aflibercept (EYLEA) SOLN 2 mg    2. Retinal edema  H35.81 OCT, Retina - OU - Both Eyes    3. Essential hypertension  I10     4. Hypertensive retinopathy of both eyes  H35.033 Fluorescein Angiography  Optos (Transit OD)    5. Combined forms of age-related cataract of both eyes  H25.813      1,2. Moderate non-proliferative diabetic retinopathy, both eyes  - f/u delayed to 9.5 wks due to pt having covid around time of last scheduled f/u  - s/p IVE #1 OD (06.22.22)--sample, #2 (09.07.22) - previously seen at [facility] by Dr. Theora Gianotti (Fax [phone]) - history of anti-VEGF injections since 2019 -- last injection IVE OD Feb/Mar 2022 -- q62mointerval - [facility]- exam w/ scattered [place] diabetic macular edema, both eyes -- OD: Mild interval improvement in IRF.  Persistent large central cyst., OS: Non-central cystic changes temporal and inferior macula caught on widefield -- stable - FA (11.14.22) shows OD: Focal hyperflourescence IT to fovea; no NV OS: No NV - BCVA 20/40 OD; 20/20 OS - recommend IVE #3 OD today, 11.14.22 -- f/u in 6 wks - pt wishes to proceed - RBA of procedure discussed, questions answered - informed consent obtained and signed - see procedure note - f/u in 6 wks -- DFE/OCT, possible injection(s)  3,4. Hypertensive retinopathy OU - discussed importance of tight BP control - monitor  5. Mixed Cataract OU - The symptoms of cataract, surgical options, and treatments and risks were discussed with patient. - discussed diagnosis and progression - not yet visually significant - monitor for now  Ophthalmic Meds Ordered this visit:  Meds ordered this encounter  Medications   aflibercept (EYLEA) SOLN 2 mg     Return in about 6 weeks (around 05/31/2021) for mod NPDR w/DFE/OCT, Possible Injxn.  There are no Patient Instructions on file for this visit.  This document serves as a record of services personally performed by BGardiner Sleeper MD, PhD. It was created on their behalf by DRoselee Nova COMT. The creation of this record is the provider's dictation and/or activities during the  visit.  Electronically signed by: DRoselee Nova COMT 04/19/21 11:13 PM  This document serves as a record of services personally performed by BGardiner Sleeper MD, PhD. It was created on their behalf by AEstill Bakes COT an ophthalmic technician. The creation of this record is the provider's dictation and/or activities during the visit.    Electronically signed by: AEstill Bakes CTenn[place]11.14.22 @ 11:13 PM   BGardiner Sleeper M.D., Ph.D. Diseases & Surgery of the Retina and [place]11.14.22  I have reviewed the above documentation for accuracy and completeness, and I agree with the above. BGardiner Sleeper M.D., Ph.D. 04/19/21 11:13 PM   Abbreviations: M myopia (nearsighted); A astigmatism; H hyperopia (farsighted); P presbyopia; Mrx spectacle prescription;  CTL contact lenses; OD right eye; OS left eye; OU both eyes  XT exotropia; ET esotropia; PEK punctate epithelial keratitis; PEE punctate epithelial erosions; DES dry eye syndrome; MGD meibomian gland dysfunction; ATs artificial tears; PFAT's preservative free artificial tears; NH[place]nuclear sclerotic cataract; PSC posterior subcapsular cataract; ERM epi-retinal membrane; PVD posterior vitreous detachment; RD retinal detachment; DM diabetes mellitus; DR diabetic retinopathy; NPDR non-proliferative diabetic retinopathy; PDR proliferative diabetic retinopathy; CSME clinically significant macular edema; DME diabetic macular edema; dbh dot blot hemorrhages; CWS cotton wool spot; POAG primary open angle glaucoma; C/D cup-to-disc ratio; HVF humphrey visual field; GVF goldmann visual field; OCT optical coherence tomography; IOP intraocular pressure; BRVO Branch retinal vein  occlusion; CRVO central retinal vein occlusion; CRAO central retinal artery occlusion; BRAO branch retinal artery occlusion; RT retinal tear; SB scleral buckle; PPV pars plana vitrectomy; VH Vitreous hemorrhage; PRP panretinal laser photocoagulation; IVK  intravitreal kenalog; VMT vitreomacular traction; MH Macular hole;  NVD neovascularization of the disc; NVE neovascularization elsewhere; AREDS age related eye disease study; ARMD age related macular degeneration; POAG primary open angle glaucoma; EBMD epithelial/anterior basement membrane dystrophy; ACIOL anterior chamber intraocular lens; IOL intraocular lens; PCIOL posterior chamber intraocular lens; Phaco/IOL phacoemulsification with intraocular lens placement; [place] photorefractive keratectomy; LASIK laser assisted in situ keratomileusis; HTN hypertension; DM diabetes mellitus; COPD chronic obstructive pulmonary disease

## 2021-04-15 LAB — COMPLETE METABOLIC PANEL WITH GFR
AG Ratio: 1.6 (calc) (ref 1.0–2.5)
ALT: 13 U/L (ref 6–29)
AST: 17 U/L (ref 10–35)
Albumin: 4.2 g/dL (ref 3.6–5.1)
Alkaline phosphatase (APISO): 70 U/L (ref 37–153)
BUN: 19 mg/dL (ref 7–25)
CO2: 21 mmol/L (ref 20–32)
Calcium: 9.5 mg/dL (ref 8.6–10.4)
Chloride: 106 mmol/L (ref 98–110)
Creat: 0.7 mg/dL (ref 0.60–1.00)
Globulin: 2.6 g/dL (calc) (ref 1.9–3.7)
Glucose, Bld: 109 mg/dL — ABNORMAL HIGH (ref 65–99)
Potassium: 4.3 mmol/L (ref 3.5–5.3)
Sodium: 141 mmol/L (ref 135–146)
Total Bilirubin: 0.3 mg/dL (ref 0.2–1.2)
Total Protein: 6.8 g/dL (ref 6.1–8.1)
eGFR: 91 mL/min/{1.73_m2} (ref >=60)

## 2021-04-15 LAB — CBC WITH DIFFERENTIAL/PLATELET
Absolute Monocytes: 740 cells/uL (ref 200–950)
Basophils Absolute: 26 cells/uL (ref 0–200)
Basophils Relative: 0.3 %
Eosinophils Absolute: 507 cells/uL — ABNORMAL HIGH (ref 15–500)
Eosinophils Relative: 5.9 %
HCT: 36.5 % (ref 35.0–45.0)
Hemoglobin: 12 g/dL (ref 11.7–15.5)
Lymphs Abs: 2038 cells/uL (ref 850–3900)
MCH: 28.8 pg (ref 27.0–33.0)
MCHC: 32.9 g/dL (ref 32.0–36.0)
MCV: 87.7 fL (ref 80.0–100.0)
MPV: 10.1 fL (ref 7.5–12.5)
Monocytes Relative: 8.6 %
Neutro Abs: 5289 cells/uL (ref 1500–7800)
Neutrophils Relative %: 61.5 %
Platelets: 263 10*3/uL (ref 140–400)
RBC: 4.16 10*6/uL (ref 3.80–5.10)
RDW: 12.8 % (ref 11.0–15.0)
Total Lymphocyte: 23.7 %
WBC: 8.6 10*3/uL (ref 3.8–10.8)

## 2021-04-15 LAB — TSH+FREE T4: TSH W/REFLEX TO FT4: 1.44 mIU/L (ref 0.40–4.50)

## 2021-04-19 ENCOUNTER — Encounter (INDEPENDENT_AMBULATORY_CARE_PROVIDER_SITE_OTHER): Payer: Self-pay | Admitting: Ophthalmology

## 2021-04-19 ENCOUNTER — Ambulatory Visit (INDEPENDENT_AMBULATORY_CARE_PROVIDER_SITE_OTHER): Payer: Medicare HMO | Admitting: Ophthalmology

## 2021-04-19 ENCOUNTER — Other Ambulatory Visit: Payer: Self-pay

## 2021-04-19 DIAGNOSIS — E113313 Type 2 diabetes mellitus with moderate nonproliferative diabetic retinopathy with macular edema, bilateral: Secondary | ICD-10-CM

## 2021-04-19 DIAGNOSIS — H35033 Hypertensive retinopathy, bilateral: Secondary | ICD-10-CM

## 2021-04-19 DIAGNOSIS — I10 Essential (primary) hypertension: Secondary | ICD-10-CM

## 2021-04-19 DIAGNOSIS — H25813 Combined forms of age-related cataract, bilateral: Secondary | ICD-10-CM | POA: Diagnosis not present

## 2021-04-19 DIAGNOSIS — H3581 Retinal edema: Secondary | ICD-10-CM

## 2021-04-19 MED ORDER — AFLIBERCEPT 2MG/0.05ML IZ SOLN FOR KALEIDOSCOPE
2.0000 mg | INTRAVITREAL | Status: AC | PRN
  Administered 2021-04-19: 2 mg via INTRAVITREAL

## 2021-04-23 ENCOUNTER — Other Ambulatory Visit: Payer: Self-pay

## 2021-04-23 ENCOUNTER — Encounter: Payer: Self-pay | Admitting: Podiatry

## 2021-04-23 ENCOUNTER — Ambulatory Visit: Payer: Medicare HMO | Admitting: Podiatry

## 2021-04-23 DIAGNOSIS — I739 Peripheral vascular disease, unspecified: Secondary | ICD-10-CM | POA: Diagnosis not present

## 2021-04-23 DIAGNOSIS — M21611 Bunion of right foot: Secondary | ICD-10-CM

## 2021-04-23 DIAGNOSIS — Z89429 Acquired absence of other toe(s), unspecified side: Secondary | ICD-10-CM

## 2021-04-23 DIAGNOSIS — Z7901 Long term (current) use of anticoagulants: Secondary | ICD-10-CM | POA: Diagnosis not present

## 2021-04-23 DIAGNOSIS — B351 Tinea unguium: Secondary | ICD-10-CM

## 2021-04-23 DIAGNOSIS — E1152 Type 2 diabetes mellitus with diabetic peripheral angiopathy with gangrene: Secondary | ICD-10-CM | POA: Diagnosis not present

## 2021-04-23 NOTE — Progress Notes (Signed)
  Subjective:  Patient ID: Erin Mitchell, female    DOB: 11-18-1945,   MRN: [id number]  Chief Complaint  Patient presents with   Nail Problem    Trim nails    Callouses    There is a hard layer of skin on the 4th toe right but does not want it trimmed just how to take care of it and what to do and what to use on it     75 y.o. female presents for painful elongated and discolored nails that are difficult to trim. Has been in the care of Dr. Ardelle Anton. Relates she is unable to trim herself. She is diabetic and last A1c was 6.5. 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 Also has a history of PAD . Denies any other pedal complaints. Denies n/v/f/c.   Past Medical History:  Diagnosis Date   Atrial fibrillation (HCC)    on Plavix   Cataract    Diabetes (HCC)    Diabetic retinopathy (HCC)    Exposure to Agent Orange    Hypertension    Hypertensive retinopathy    hypothyroidism    Hypothyroidism    Kidney disease     Objective:  Physical Exam: Vascular: DP/PT pulses 2/4 bilateral. CFT <3 seconds. Normal hair growth on digits. No edema.  Skin. No lacerations or abrasions bilateral feet. Nails 1-5 are thickened discolored and elongated with subungual debris.  Musculoskeletal: MMT 5/5 bilateral lower extremities in DF, PF, Inversion and Eversion. Deceased ROM in DF of ankle joint. Right second and third digits amputated. Sever bunion of right foot.  Neurological: Sensation intact to light touch.   Assessment:   1. Dermatophytosis of nail   2. Bunion, right   3. PAD (peripheral artery disease) (HCC)   4. History of amputation of toe (HCC)   5. Chronic anticoagulation   6. Type 2 diabetes mellitus with diabetic peripheral angiopathy and gangrene, without long-term current use of insulin (HCC)      Plan:  Patient was evaluated and treated and all questions answered. -Discussed and educated patient on diabetic foot care, especially with   regards to the vascular, neurological and musculoskeletal systems.  -Stressed the importance of good glycemic control and the detriment of not  controlling glucose levels in relation to the foot. -Discussed supportive shoes at all times and checking feet regularly.  -Mechanically debrided all nails 1-5 bilateral using sterile nail nipper and filed with dremel without incident  -Answered all patient questions -Will be fitted for diabetic shoes.  -Patient to return  in 3 months for at risk foot care -Patient advised to call the office if any problems or questions arise in the meantime.   Louann Sjogren, DPM

## 2021-05-09 ENCOUNTER — Other Ambulatory Visit: Payer: Self-pay | Admitting: Family Medicine

## 2021-05-12 NOTE — Progress Notes (Signed)
HPI: FU diabetes mellitus, hypertension, chronic stage III kidney disease, peripheral vascular disease.  Previously cared for in [place].  She apparently had a bout of atrial fibrillation in the setting of osteomyelitis in the past.  She states she was not anticoagulated and had outpatient Holter that was unremarkable. Nuclear study 6/22 showed EF 69, apical infarct, no ischemia. Echo 6/22 showed normal LV function, mild LVH, grade 1 DD. Since she was last seen, she denies dyspnea, chest pain, palpitations or syncope.  She occasionally has mild pedal edema.  Current Outpatient Medications  Medication Sig Dispense Refill   Alcohol Swabs (DROPSAFE ALCOHOL PREP) 70 % PADS USE AS DIRECTED. 300 each 2   Blood Glucose Monitoring Suppl (TRUE METRIX METER) w/Device KIT Use as directed to test blood glucose daily 1 kit 0   clopidogrel (PLAVIX) 75 MG tablet TAKE 1 TABLET BY MOUTH ONCE DAILY (Patient taking differently: Take 75 mg by mouth daily.) 90 tablet 3   glucose blood (TRUE METRIX BLOOD GLUCOSE TEST) test strip Use as instructed 100 each 12   loperamide (IMODIUM A-D) 2 MG tablet Take 2-4 mg by mouth 4 (four) times daily as needed for diarrhea or loose stools.     meclizine (ANTIVERT) 25 MG tablet Take 1 tablet (25 mg total) by mouth 3 (three) times daily as needed for dizziness. 30 tablet 0   metFORMIN (GLUCOPHAGE) 500 MG tablet Take 2 tablets (1,000 mg total) by mouth 2 (two) times daily with a meal. 360 tablet 3   metoprolol tartrate (LOPRESSOR) 25 MG tablet Take 1 tablet (25 mg total) by mouth 2 (two) times daily with a meal.     NP THYROID 90 MG tablet TAKE 1 TABLET (90 MG TOTAL) BY MOUTH DAILY BEFORE BREAKFAST. 30 tablet 2   PAPAYA ENZYME PO Take 2 capsules by mouth daily as needed (heartburn).     Probiotic Product (ALIGN) 4 MG CAPS Take 4 mg by mouth 2 (two) times daily.     rosuvastatin (CRESTOR) 40 MG tablet TAKE 1 TABLET BY MOUTH ONCE DAILY (Patient taking differently: Take 40 mg by  mouth at bedtime.) 90 tablet 3   saccharomyces boulardii (FLORASTOR) 250 MG capsule Take 1 capsule (250 mg total) by mouth 2 (two) times daily. 60 capsule 0   TRUEplus Lancets 33G MISC Use as directed daily. 100 each 3   losartan (COZAAR) 50 MG tablet Take 1 tablet (50 mg total) by mouth daily. 90 tablet 3   No current facility-administered medications for this visit.     Past Medical History:  Diagnosis Date   Atrial fibrillation ([place])    on Plavix   Cataract    Diabetes ([place])    Diabetic retinopathy ([place])    Exposure to Agent Orange    Hypertension    Hypertensive retinopathy    hypothyroidism    Hypothyroidism    Kidney disease     Past Surgical History:  Procedure Laterality Date   APPENDECTOMY     CESAREAN SECTION     CHOLECYSTECTOMY     HERNIA REPAIR     TOE AMPUTATION Right    2nd & 3rd   VENTRAL HERNIA REPAIR N/A 12/28/2020   Procedure: LAPAROSCOPIC VENTRAL [place];  Surgeon: Armandina Gemma, MD;  Location: [facility];  Service: General;  Laterality: N/A;    Social History   Socioeconomic History   Marital status: Married    Spouse name: Not on file   Number of  children: 7   Years of education: Not on file   Highest education level: Not on file  Occupational History   Occupation: Retired  Tobacco Use   Smoking status: Former    Packs/day: 0.25    Years: 1.00    Pack years: 0.25    Types: Cigarettes    Quit date: 06/06/1970    Years since quitting: 50.9   Smokeless tobacco: Never  Vaping Use   Vaping Use: Never used  Substance and Sexual Activity   Alcohol use: Never   Drug use: Never   Sexual activity: Not on file  Other Topics Concern   Not on file  Social History Narrative   Not on file   Social Determinants of Health   Financial Resource Strain: Not on file  Food Insecurity: Not on file  Transportation Needs: Not on file  Physical Activity: Not on file  Stress: Not on file  Social Connections: Not on file  Intimate  Partner Violence: Not on file    Family History  Problem Relation Age of Onset   Hypertension Father    Diabetes Brother    Emphysema Mother     ROS: no fevers or chills, productive cough, hemoptysis, dysphasia, odynophagia, melena, hematochezia, dysuria, hematuria, rash, seizure activity, orthopnea, PND, claudication. Remaining systems are negative.  Physical Exam: Well-developed well-nourished in no acute distress.  Skin is warm and dry.  HEENT is normal.  Neck is supple.  Chest is clear to auscultation with normal expansion.  Cardiovascular exam is regular rate and rhythm.  Abdominal exam nontender or distended. No masses palpated. Extremities show no edema. neuro grossly intact   A/P  1 history of atrial fibrillation-this apparently occurred in the setting of osteomyelitis.  She has had no recurrences and is therefore not anticoagulated.   2 peripheral vascular disease-continue plavix and statin.   3 hyperlipidemia-continue crestor.   4 coronary artery disease-based on previous nuclear study study showing infarct; no ischemia; continue medical therapy with plavix and statin. Can consider cath in the future if she develops symtoms.    5 hypertension-blood pressure elevated; however she follows this at home and it is typically controlled.  Continue present medications and follow.  Kirk Ruths, MD

## 2021-05-19 ENCOUNTER — Encounter: Payer: Self-pay | Admitting: Cardiology

## 2021-05-19 ENCOUNTER — Ambulatory Visit: Payer: Medicare HMO | Admitting: Cardiology

## 2021-05-19 ENCOUNTER — Other Ambulatory Visit: Payer: Self-pay

## 2021-05-19 VITALS — BP 188/70 | HR 78 | Ht 64.0 in | Wt 155.8 lb

## 2021-05-19 DIAGNOSIS — E785 Hyperlipidemia, unspecified: Secondary | ICD-10-CM | POA: Diagnosis not present

## 2021-05-19 DIAGNOSIS — E1169 Type 2 diabetes mellitus with other specified complication: Secondary | ICD-10-CM | POA: Diagnosis not present

## 2021-05-19 DIAGNOSIS — I10 Essential (primary) hypertension: Secondary | ICD-10-CM

## 2021-05-19 DIAGNOSIS — I739 Peripheral vascular disease, unspecified: Secondary | ICD-10-CM

## 2021-05-19 DIAGNOSIS — I480 Paroxysmal atrial fibrillation: Secondary | ICD-10-CM | POA: Diagnosis not present

## 2021-05-19 NOTE — Patient Instructions (Signed)

## 2021-05-27 NOTE — Progress Notes (Signed)
[facility] Note  06/02/2021     CHIEF COMPLAINT Patient presents for Retina Follow Up    HISTORY OF PRESENT ILLNESS: Erin Mitchell is a 75 y.o. female who presents to the clinic today for:   HPI     Retina Follow Up   Patient presents with  Diabetic Retinopathy.  In both eyes.  Severity is moderate.  Duration of 6 weeks.  Since onset it is stable.  I, the attending physician,  performed the HPI with the patient and updated documentation appropriately.        Comments   Patient states vision the same OU. BS was 127 this am. Last a1c was 6.5, checked mid-November 2022.       Last edited by Bernarda Caffey, MD on 06/02/2021  1:56 PM.     Referring physician: Luetta Nutting, DO [address]  [address] [place],  [place] [address]  HISTORICAL INFORMATION:   Selected notes from the MEDICAL RECORD NUMBER Referred by Dr. Luetta Nutting for DM exam LEE:  Ocular Hx- PMH- DM, HTN, afib, kidney disease, last a1c was 6.8 on 05.05.22    CURRENT MEDICATIONS: No current outpatient medications on file. (Ophthalmic Drugs)   No current facility-administered medications for this visit. (Ophthalmic Drugs)   Current Outpatient Medications (Other)  Medication Sig   Alcohol Swabs (DROPSAFE ALCOHOL PREP) 70 % PADS USE AS DIRECTED.   Blood Glucose Monitoring Suppl (TRUE METRIX METER) w/Device KIT Use as directed to test blood glucose daily   clopidogrel (PLAVIX) 75 MG tablet TAKE 1 TABLET BY MOUTH ONCE DAILY (Patient taking differently: Take 75 mg by mouth daily.)   glucose blood (TRUE METRIX BLOOD GLUCOSE TEST) test strip Use as instructed   loperamide (IMODIUM A-D) 2 MG tablet Take 2-4 mg by mouth 4 (four) times daily as needed for diarrhea or loose stools.   metFORMIN (GLUCOPHAGE) 500 MG tablet Take 2 tablets (1,000 mg total) by mouth 2 (two) times daily with a meal.   metoprolol tartrate (LOPRESSOR) 25 MG tablet Take 1 tablet (25 mg  total) by mouth 2 (two) times daily with a meal.   NP THYROID 90 MG tablet TAKE 1 TABLET (90 MG TOTAL) BY MOUTH DAILY BEFORE BREAKFAST.   PAPAYA ENZYME PO Take 2 capsules by mouth daily as needed (heartburn).   Probiotic Product (ALIGN) 4 MG CAPS Take 4 mg by mouth 2 (two) times daily.   rosuvastatin (CRESTOR) 40 MG tablet TAKE 1 TABLET BY MOUTH ONCE DAILY (Patient taking differently: Take 40 mg by mouth at bedtime.)   saccharomyces boulardii (FLORASTOR) 250 MG capsule Take 1 capsule (250 mg total) by mouth 2 (two) times daily.   TRUEplus Lancets 33G MISC Use as directed daily.   losartan (COZAAR) 50 MG tablet Take 1 tablet (50 mg total) by mouth daily.   meclizine (ANTIVERT) 25 MG tablet Take 1 tablet (25 mg total) by mouth 3 (three) times daily as needed for dizziness. (Patient not taking: Reported on 06/02/2021)   No current facility-administered medications for this visit. (Other)   REVIEW OF SYSTEMS: ROS   Positive for: Genitourinary, Endocrine, Cardiovascular, Eyes Negative for: Constitutional, Gastrointestinal, Neurological, Skin, Musculoskeletal, HENT, Respiratory, Psychiatric, Allergic/Imm, Heme/Lymph Last edited by Roselee Nova D, COT on 06/02/2021  1:07 PM.     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 & 3rd   VENTRAL HERNIA REPAIR N/A 12/28/2020   Procedure: LAPAROSCOPIC VENTRAL [place];  Surgeon: Armandina Gemma, MD;  Location: [facility];  Service: General;  Laterality: N/A;   FAMILY HISTORY Family History   Problem Relation Age of Onset   Hypertension Father    Diabetes Brother    Emphysema Mother     SOCIAL HISTORY Social History   Tobacco Use   Smoking status: Former    Packs/day: 0.25    Years: 1.00    Pack years: 0.25    Types: Cigarettes    Quit date: 06/06/1970    Years since quitting: 51.0   Smokeless tobacco: Never  Vaping Use   Vaping Use: Never used  Substance Use Topics   Alcohol use: Never   Drug use: Never       OPHTHALMIC EXAM: Base Eye Exam     Visual Acuity (Snellen - Linear)       Right Left   Dist cc 20/50 +2 20/20   Dist ph cc 20/40 +2     Correction: Glasses         Tonometry (Tonopen, 1:17 PM)       Right Left   Pressure 12 14         Pupils       Dark Light Shape React APD   Right 2 1 Round Brisk None   Left 2 1 Round Brisk None         Visual Fields (Counting fingers)       Left Right    Full Full         Extraocular Movement       Right Left    Full, Ortho Full, Ortho         Neuro/Psych     Oriented x3: Yes   Mood/Affect: Normal         Dilation     Both eyes: 1.0% Mydriacyl, 2.5% Phenylephrine @ 1:17 PM           Slit Lamp and Fundus Exam     Slit Lamp Exam       Right Left   Lids/Lashes Dermatochalasis - upper lid, Meibomian gland dysfunction Dermatochalasis - upper lid, Meibomian gland dysfunction   Conjunctiva/Sclera White and quiet White and quiet   Cornea arcus, trace PEE arcus, trace PEE   Anterior Chamber deep, clear, narrow angles deep, clear, narrow angles   Iris Round and moderately dilated, mild anterior bowing Round and moderately dilated, mild anterior bowing   Lens 2-3+ Nuclear sclerosis with brunescence, 2+ Cortical cataract 2-3+ Nuclear sclerosis with brunescence, 2+ Cortical cataract   Anterior Vitreous Vitreous syneresis, Posterior vitreous detachment Vitreous syneresis, Posterior vitreous detachment         Fundus Exam       Right Left   Disc Pink and Sharp, +cupping  Pink and Sharp, +cupping   C/D Ratio 0.7 0.65   Macula Blunted foveal reflex, central edema, RPE mottling, cluster of MA temporal to fovea -- all  improving Flat, Blunted foveal reflex, RPE mottling, scatterd MA, non-central cystic changes temp mac -- slightly improved   Vessels attenuated, Tortuous attenuated, Tortuous   Periphery Attached, mild reticular degeneration, mild MA Attached, mild reticular degeneration, scattered MA, cluster DBH inf to disc           Refraction     Wearing Rx       Sphere Cylinder Add   Right +1.25 Sphere +2.75   Left +1.50 Sphere +2.75    Type: Bifocal            IMAGING AND PROCEDURES  Imaging and Procedures for 06/02/2021  OCT, Retina - OU - Both Eyes       Right Eye Quality was good. Central Foveal Thickness: 456. Progression has improved. Findings include abnormal foveal contour, intraretinal fluid, no SRF, vitreomacular adhesion (Mild interval improvement in IRF, Persistent large central cyst.).   Left Eye Quality was good. Central Foveal Thickness: 256. Progression has been stable. Findings include abnormal foveal contour, no SRF, intraretinal fluid, vitreomacular adhesion (Mild interval improvement in IRF/IRHM temporal macula, persistent central VMT).   Notes *Images captured and stored on drive  Diagnosis / Impression:  OD: +central DME, Mild interval improvement in IRF.  Persistent large central cyst. OS: Mild interval improvement in IRF/IRHM temporal macula, persistent central VMT  Clinical management:  See below  Abbreviations: NFP - Normal foveal profile. CME - cystoid macular edema. PED - pigment epithelial detachment. IRF - intraretinal fluid. SRF - subretinal fluid. EZ - ellipsoid zone. ERM - epiretinal membrane. ORA - outer retinal atrophy. ORT - outer retinal tubulation. SRHM - subretinal hyper-reflective material. IRHM - intraretinal hyper-reflective material      Intravitreal Injection, Pharmacologic Agent - OD -  Right Eye       Time Out 06/02/2021. 1:42 PM. Confirmed correct patient, procedure, site, and patient consented.   Anesthesia Topical anesthesia was used. Anesthetic medications included Lidocaine 2%, Proparacaine 0.5%.   Procedure Preparation included 5% betadine to ocular surface, eyelid speculum. A (32g) needle was used.   Injection: 2 mg aflibercept 2 MG/0.05ML   Route: Intravitreal, Site: Right Eye   NDC: [id number], Lot: [id number], Expiration date: 05/05/2022, Waste: 0.05 mL   Post-op Post injection exam found visual acuity of at least counting fingers. The patient tolerated the procedure well. There were no complications. The patient received written and verbal post procedure care education. Post injection medications were not given.   Notes              ASSESSMENT/PLAN:    ICD-10-CM   1. Moderate nonproliferative diabetic retinopathy of both eyes with macular edema associated with type 2 diabetes mellitus (HCC)  E11.3313 OCT, Retina - OU - Both Eyes    Intravitreal Injection, Pharmacologic Agent - OD - Right Eye    aflibercept (EYLEA) SOLN 2 mg    2. Essential hypertension  I10     3. Hypertensive retinopathy of both eyes  H35.033     4. Combined forms of age-related cataract of both eyes  H25.813       1. Moderate non-proliferative diabetic retinopathy, both eyes  - s/p IVE #1 OD (06.22.22)--sample, #2 (09.07.22), #3 (11.14.22) - previously seen at [facility] by Dr. Theora Gianotti (Fax [phone]) - history of anti-VEGF injections since 2019 -- last injection IVE OD Feb/Mar 2022 -- q38mointerval - [facility] exam w/ scattered MA - OCT shows diabetic macular edema, both eyes --  OD: +central DME, Mild interval improvement in IRF at 6 wks; OS: Mild interval improvement in IRF/IRHM temporal macula, persistent central VMT - FA (11.14.22) shows OD: Focal hyperflourescence IT to fovea; no NV OS: No NV -  BCVA 20/40 OD; 20/20 OS - recommend IVE #4 OD today, 12.28.22 -- f/u in 6 wks - pt wishes to proceed - RBA of procedure discussed, questions answered - informed consent obtained and signed - see procedure note - f/u in 6 wks -- DFE/OCT, possible injection(s)  2,3. Hypertensive retinopathy OU - discussed importance of tight BP control - monitor  4. Mixed Cataract OU - The symptoms of cataract, surgical options, and treatments and risks were discussed with patient. - discussed diagnosis and progression - not yet visually significant - monitor for now  Ophthalmic Meds Ordered this visit:  Meds ordered this encounter  Medications   aflibercept (EYLEA) SOLN 2 mg     Return in about 6 weeks (around 07/14/2021) for f/u NPDR OU, DFE, OCT.  There are no Patient Instructions on file for this visit.  This document serves as a record of services personally performed by Gardiner Sleeper, MD, PhD. It was created on their behalf by Roselee Nova, COMT. The creation of this record is the provider's dictation and/or activities during the visit.  This document serves as a record of services personally performed by Gardiner Sleeper, MD, PhD. It was created on their behalf by Orvan Falconer, an ophthalmic technician. The creation of this record is the provider's dictation and/or activities during the visit.    Electronically signed by: Orvan Falconer, OA, 06/02/21  1:59 PM  This document serves as a record of services personally performed by Gardiner Sleeper, MD, PhD. It was created on their behalf by San Jetty. Owens Shark, OA an ophthalmic technician. The creation of this record is the provider's dictation and/or activities during the visit.    Electronically signed by: San Jetty. Owens Shark, [place] 12.28.2022 1:59 PM  Gardiner Sleeper, M.D., Ph.D. Diseases & Surgery of the Retina and Vitreous [facility]  I have reviewed the above documentation for accuracy and completeness, and I agree  with the above. Gardiner Sleeper, M.D., Ph.D. 06/02/21 1:59 PM   Abbreviations: M myopia (nearsighted); A astigmatism; H hyperopia (farsighted); P presbyopia; Mrx spectacle prescription;  CTL contact lenses; OD right eye; OS left eye; OU both eyes  XT exotropia; ET esotropia; PEK punctate epithelial keratitis; PEE punctate epithelial erosions; DES dry eye syndrome; MGD meibomian gland dysfunction; ATs artificial tears; PFAT's preservative free artificial tears; [place] nuclear sclerotic cataract; PSC posterior subcapsular cataract; ERM epi-retinal membrane; PVD posterior vitreous detachment; RD retinal detachment; DM diabetes mellitus; DR diabetic retinopathy; NPDR non-proliferative diabetic retinopathy; PDR proliferative diabetic retinopathy; CSME clinically significant macular edema; DME diabetic macular edema; dbh dot blot hemorrhages; CWS cotton wool spot; POAG primary open angle glaucoma; C/D cup-to-disc ratio; HVF humphrey visual field; GVF goldmann visual field; OCT optical coherence tomography; IOP intraocular pressure; BRVO Branch retinal vein occlusion; CRVO central retinal vein occlusion; CRAO central retinal artery occlusion; BRAO branch retinal artery occlusion; RT retinal tear; SB scleral buckle; PPV pars plana vitrectomy; VH Vitreous hemorrhage; PRP panretinal laser photocoagulation; IVK intravitreal kenalog; VMT vitreomacular traction; MH Macular hole;  NVD neovascularization of the disc; NVE neovascularization elsewhere; AREDS age related eye disease study; ARMD age related macular degeneration; POAG primary open angle glaucoma; EBMD epithelial/anterior basement membrane dystrophy; ACIOL anterior chamber intraocular lens; IOL intraocular lens; PCIOL posterior  chamber intraocular lens; Phaco/IOL phacoemulsification with intraocular lens placement; [place] photorefractive keratectomy; LASIK laser assisted in situ keratomileusis; HTN hypertension; DM diabetes mellitus; COPD chronic obstructive pulmonary  disease

## 2021-06-02 ENCOUNTER — Encounter (INDEPENDENT_AMBULATORY_CARE_PROVIDER_SITE_OTHER): Payer: Self-pay | Admitting: Ophthalmology

## 2021-06-02 ENCOUNTER — Ambulatory Visit (INDEPENDENT_AMBULATORY_CARE_PROVIDER_SITE_OTHER): Payer: Medicare HMO | Admitting: Ophthalmology

## 2021-06-02 ENCOUNTER — Other Ambulatory Visit: Payer: Self-pay

## 2021-06-02 DIAGNOSIS — H25813 Combined forms of age-related cataract, bilateral: Secondary | ICD-10-CM | POA: Diagnosis not present

## 2021-06-02 DIAGNOSIS — I10 Essential (primary) hypertension: Secondary | ICD-10-CM

## 2021-06-02 DIAGNOSIS — H35033 Hypertensive retinopathy, bilateral: Secondary | ICD-10-CM | POA: Diagnosis not present

## 2021-06-02 DIAGNOSIS — E113313 Type 2 diabetes mellitus with moderate nonproliferative diabetic retinopathy with macular edema, bilateral: Secondary | ICD-10-CM | POA: Diagnosis not present

## 2021-06-02 MED ORDER — AFLIBERCEPT 2MG/0.05ML IZ SOLN FOR KALEIDOSCOPE
2.0000 mg | INTRAVITREAL | Status: AC | PRN
  Administered 2021-06-02: 14:00:00 2 mg via INTRAVITREAL

## 2021-06-22 ENCOUNTER — Other Ambulatory Visit: Payer: Self-pay | Admitting: Family Medicine

## 2021-06-22 DIAGNOSIS — I739 Peripheral vascular disease, unspecified: Secondary | ICD-10-CM

## 2021-06-22 DIAGNOSIS — I480 Paroxysmal atrial fibrillation: Secondary | ICD-10-CM

## 2021-07-05 ENCOUNTER — Other Ambulatory Visit: Payer: Self-pay

## 2021-07-05 ENCOUNTER — Ambulatory Visit: Payer: Medicare HMO

## 2021-07-05 DIAGNOSIS — E1152 Type 2 diabetes mellitus with diabetic peripheral angiopathy with gangrene: Secondary | ICD-10-CM

## 2021-07-05 DIAGNOSIS — M21611 Bunion of right foot: Secondary | ICD-10-CM

## 2021-07-05 DIAGNOSIS — Z89429 Acquired absence of other toe(s), unspecified side: Secondary | ICD-10-CM

## 2021-07-05 NOTE — Progress Notes (Signed)
SITUATION Reason for Consult: Evaluation for Prefabricated Diabetic Shoes and Bilateral Custom Diabetic Inserts. Patient / Caregiver Report: Patient would like well fitting shoes  OBJECTIVE DATA: Patient History / Diagnosis:    ICD-10-CM   1. Type 2 diabetes mellitus with diabetic peripheral angiopathy and gangrene, without long-term current use of insulin (HCC)  E11.52     2. Bunion, right  M21.611     3. History of amputation of toe (HCC)  Z89.429       Current or Previous Devices:   Historical diabetic shoe user  In-Person Foot Examination: Ulcers & Callousing:   Historical  Toe / Foot Deformities:   - Hallux Valgus   Shoe Size: 9-6E right, 9-D left - Patient would prefer an 11 due to width and sensitivity of right bunion  ORTHOTIC RECOMMENDATION Recommended Devices: - 1x pair prefabricated PDAC approved diabetic shoes: Patient selected [id number] 11-XW - 3x pair custom-to-patient vacuum formed diabetic insoles.   GOALS OF SHOES AND INSOLES - Reduce shear and pressure - Reduce / Prevent callus formation - Reduce / Prevent ulceration - Protect the fragile healing compromised diabetic foot.  Patient would benefit from diabetic shoes and inserts as patient has diabetes mellitus and the patient has one or more of the following conditions: - History of partial or complete amputation of the foot - History of previous foot ulceration. - History of pre-ulcerative callus - Peripheral neuropathy with evidence of callus formation - Foot deformity - Poor circulation  ACTIONS PERFORMED Patient was casted for insoles via crush box and measured for shoes via brannock device. Procedure was explained and patient tolerated procedure well. All questions were answered and concerns addressed.  PLAN Patient is to ensure treating physician receives and completes diabetic paperwork. Casts and shoe order are to be held until paperwork is received. Once received patient is to be scheduled  for fitting in four weeks.

## 2021-07-12 ENCOUNTER — Telehealth: Payer: Self-pay

## 2021-07-12 NOTE — Progress Notes (Signed)
[facility] Note  07/14/2021     CHIEF COMPLAINT Patient presents for Retina Follow Up    HISTORY OF PRESENT ILLNESS: Erin Mitchell is a 76 y.o. female who presents to the clinic today for:   HPI     Retina Follow Up   Patient presents with  Diabetic Retinopathy.  In both eyes.  Duration of 6 weeks.  Since onset it is stable.  I, the attending physician,  performed the HPI with the patient and updated documentation appropriately.        Comments   6 week follow up NPDR OU- Vision appears stable OU.   BS 142 this morning.   A1C 6.5      Last edited by Bernarda Caffey, MD on 07/14/2021  8:28 AM.     Referring physician: Luetta Nutting, DO [address] [place],   [address]  HISTORICAL INFORMATION:   Selected notes from the MEDICAL RECORD NUMBER Referred by Dr. Luetta Nutting for DM exam LEE:  Ocular Hx- PMH- DM, HTN, afib, kidney disease, last a1c was 6.8 on 05.05.22    CURRENT MEDICATIONS: No current outpatient medications on file. (Ophthalmic Drugs)   No current facility-administered medications for this visit. (Ophthalmic Drugs)   Current Outpatient Medications (Other)  Medication Sig   clopidogrel (PLAVIX) 75 MG tablet TAKE 1 TABLET EVERY DAY   loperamide (IMODIUM A-D) 2 MG tablet Take 2-4 mg by mouth 4 (four) times daily as needed for diarrhea or loose stools.   metFORMIN (GLUCOPHAGE) 500 MG tablet Take 2 tablets (1,000 mg total) by mouth 2 (two) times daily with a meal.   metoprolol tartrate (LOPRESSOR) 25 MG tablet TAKE 1 TABLET TWICE DAILY WITH FOOD   NP THYROID 90 MG tablet TAKE 1 TABLET (90 MG TOTAL) BY MOUTH DAILY BEFORE BREAKFAST.   PAPAYA ENZYME PO Take 2 capsules by mouth daily as needed (heartburn).   Probiotic Product (ALIGN) 4 MG CAPS Take 4 mg by mouth 2 (two) times daily.   rosuvastatin (CRESTOR) 40 MG tablet TAKE 1 TABLET BY MOUTH ONCE DAILY (Patient taking differently: Take 40 mg  by mouth at bedtime.)   Alcohol Swabs (DROPSAFE ALCOHOL PREP) 70 % PADS USE AS DIRECTED.   Blood Glucose Monitoring Suppl (TRUE METRIX METER) w/Device KIT Use as directed to test blood glucose daily   glucose blood (TRUE METRIX BLOOD GLUCOSE TEST) test strip Use as instructed   losartan (COZAAR) 50 MG tablet Take 1 tablet (50 mg total) by mouth daily.   meclizine (ANTIVERT) 25 MG tablet Take 1 tablet (25 mg total) by mouth 3 (three) times daily as needed for dizziness. (Patient not taking: Reported on 06/02/2021)   saccharomyces boulardii (FLORASTOR) 250 MG capsule Take 1 capsule (250 mg total) by mouth 2 (two) times daily. (Patient not taking: Reported on 07/14/2021)   TRUEplus Lancets 33G MISC Use as directed daily.   No current facility-administered medications for this visit. (Other)   REVIEW OF SYSTEMS: ROS   Positive for: Genitourinary, Endocrine, Cardiovascular, Eyes Negative for: Constitutional, Gastrointestinal, Neurological, Skin, Musculoskeletal, HENT, Respiratory, Psychiatric, Allergic/Imm, Heme/Lymph Last edited by Leonie Douglas, COA on 07/14/2021  7:54 AM.     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   VENTRAL HERNIA REPAIR N/A 12/28/2020   Procedure: LAPAROSCOPIC VENTRAL INCISIONAL HERNIA REPAIR WITH MESH;  Surgeon: Armandina Gemma, MD;  Location: [facility];  Service: General;  Laterality: N/A;   FAMILY HISTORY Family History  Problem Relation Age of Onset   Hypertension Father    Diabetes Brother    Emphysema Mother     SOCIAL  HISTORY Social History   Tobacco Use   Smoking status: Former    Packs/day: 0.25    Years: 1.00    Pack years: 0.25    Types: Cigarettes    Quit date: 06/06/1970    Years since quitting: 51.1   Smokeless tobacco: Never  Vaping Use   Vaping Use: Never used  Substance Use Topics   Alcohol use: Never   Drug use: Never       OPHTHALMIC EXAM: Base Eye Exam     Visual Acuity (Snellen - Linear)       Right Left   Dist cc 20/50 20/20   Dist ph cc 20/40 -2     Correction: Glasses         Tonometry (Tonopen, 8:01 AM)       Right Left   Pressure 13 10         Pupils       Dark Light Shape React APD   Right 2 1 Round Minimal None   Left 2 1 Round Minimal None         Visual Fields (Counting fingers)       Left Right    Full Full         Extraocular Movement       Right Left    Full Full         Neuro/Psych     Oriented x3: Yes   Mood/Affect: Normal         Dilation     Both eyes: 1.0% Mydriacyl, 2.5% Phenylephrine @ 8:01 AM           Slit Lamp and Fundus Exam     Slit Lamp Exam       Right Left   Lids/Lashes Dermatochalasis - upper lid, Meibomian gland dysfunction Dermatochalasis - upper lid, Meibomian gland dysfunction   Conjunctiva/Sclera White and quiet White and quiet   Cornea arcus, trace PEE arcus, trace PEE   Anterior Chamber deep, clear, narrow angles deep, clear, narrow angles   Iris Round and moderately dilated, mild anterior bowing Round and moderately dilated, mild anterior bowing   Lens 2-3+ Nuclear sclerosis with brunescence, 2+ Cortical cataract 2-3+ Nuclear sclerosis with brunescence, 2+ Cortical cataract   Anterior Vitreous Vitreous syneresis, Posterior vitreous detachment Vitreous syneresis, Posterior vitreous detachment         Fundus Exam       Right Left   Disc Pink and Sharp, +cupping Pink and Sharp, +cupping   C/D Ratio 0.7 0.65   Macula Blunted foveal reflex, central edema, RPE mottling, cluster of MA  temporal to fovea - persistent Flat, Blunted foveal reflex, RPE mottling, scattered MA, non-central cystic changes, focal MA and exudate temp mac -- slightly improved  Vessels attenuated, Tortuous attenuated, Tortuous   Periphery Attached, mild reticular degeneration, mild MA Attached, mild reticular degeneration, scattered MA, cluster DBH inf to disc           Refraction     Wearing Rx       Sphere Cylinder Add   Right +1.25 Sphere +2.75   Left +1.50 Sphere +2.75    Type: Bifocal            IMAGING AND PROCEDURES  Imaging and Procedures for 07/14/2021  OCT, Retina - OU - Both Eyes       Right Eye Quality was good. Central Foveal Thickness: 484. Progression has worsened. Findings include abnormal foveal contour, intraretinal fluid, no SRF, vitreomacular adhesion , intraretinal hyper-reflective material (interval increase in IRF and central cyst.).   Left Eye Quality was good. Central Foveal Thickness: 256. Progression has improved. Findings include abnormal foveal contour, no SRF, intraretinal fluid, vitreomacular adhesion (Mild interval improvement in IRF/IRHM temporal macula, persistent central VMT).   Notes *Images captured and stored on drive  Diagnosis / Impression:  OD: +central DME, interval increase in IRF and central cyst. OS: Mild interval improvement in IRF/IRHM temporal macula, persistent central VMT  Clinical management:  See below  Abbreviations: NFP - Normal foveal profile. CME - cystoid macular edema. PED - pigment epithelial detachment. IRF - intraretinal fluid. SRF - subretinal fluid. EZ - ellipsoid zone. ERM - epiretinal membrane. ORA - outer retinal atrophy. ORT - outer retinal tubulation. SRHM - subretinal hyper-reflective material. IRHM - intraretinal hyper-reflective material      Intravitreal Injection, Pharmacologic Agent - OD - Right Eye       Time Out 07/14/2021. 8:33 AM. Confirmed correct patient, procedure, site, and patient consented.    Anesthesia Topical anesthesia was used. Anesthetic medications included Lidocaine 2%, Proparacaine 0.5%.   Procedure Preparation included 5% betadine to ocular surface, eyelid speculum. A (32g) needle was used.   Injection: 2 mg aflibercept 2 MG/0.05ML   Route: Intravitreal, Site: Right Eye   NDC: [id number], Lot: [id number], Expiration date: 06/05/2022, Waste: 0.05 mL   Post-op Post injection exam found visual acuity of at least counting fingers. The patient tolerated the procedure well. There were no complications. The patient received written and verbal post procedure care education. Post injection medications were not given.   Notes             ASSESSMENT/PLAN:    ICD-10-CM   1. Moderate nonproliferative diabetic retinopathy of both eyes with macular edema associated with type 2 diabetes mellitus (HCC)  E11.3313 OCT, Retina - OU - Both Eyes    Intravitreal Injection, Pharmacologic Agent - OD - Right Eye    aflibercept (EYLEA) SOLN 2 mg    2. Essential hypertension  I10     3. Hypertensive retinopathy of both eyes  H35.033     4. Combined forms of age-related cataract of both eyes  H25.813      1. Moderate non-proliferative diabetic retinopathy, both eyes  - s/p IVE #1 OD (06.22.22)--sample, #2 (09.07.22), #3 (11.14.22), #4 (12.28.22) - [facility] by Dr. Theora Gianotti (Fax [phone]) - history of anti-VEGF injections since 2019 -- last injection IVE OD Feb/Mar 2022 -- q48mointerval - [facility] exam w/ scattered [place] diabetic macular edema, both eyes -- OD: interval increase in IRF and central cyst at 6 wks; OS: Mild interval improvement in IRF/IRHM temporal macula, persistent  central VMT - FA (11.14.22) shows OD: Focal hyperflourescence IT to fovea; no NV OS: No NV - BCVA 20/40 OD; 20/20 OS - recommend IVE #5 OD today, 02.08.23 -- f/u in 5-6 wks - pt wishes to proceed - RBA of  procedure discussed, questions answered - informed consent obtained and signed - see procedure note - f/u in 5-6 wks -- DFE/OCT, possible injection(s)  2,3. Hypertensive retinopathy OU - discussed importance of tight BP control - monitor  4. Mixed Cataract OU - The symptoms of cataract, surgical options, and treatments and risks were discussed with patient. - discussed diagnosis and progression - not yet visually significant - monitor for now  Ophthalmic Meds Ordered this visit:  Meds ordered this encounter  Medications   aflibercept (EYLEA) SOLN 2 mg     Return for f/u NPDR OU, DFE, OCT.  There are no Patient Instructions on file for this visit.  This document serves as a record of services personally performed by Gardiner Sleeper, MD, PhD. It was created on their behalf by Roselee Nova, COMT. The creation of this record is the provider's dictation and/or activities during the visit.  This document serves as a record of services personally performed by Gardiner Sleeper, MD, PhD. It was created on their behalf by Orvan Falconer, an ophthalmic technician. The creation of this record is the provider's dictation and/or activities during the visit.    Electronically signed by: Orvan Falconer, OA, 07/14/21  8:43 AM   Gardiner Sleeper, M.D., Ph.D. Diseases & Surgery of the Retina and Vitreous [facility]  I have reviewed the above documentation for accuracy and completeness, and I agree with the above. Gardiner Sleeper, M.D., Ph.D. 07/14/21 8:44 AM   Abbreviations: M myopia (nearsighted); A astigmatism; H hyperopia (farsighted); P presbyopia; Mrx spectacle prescription;  CTL contact lenses; OD right eye; OS left eye; OU both eyes  XT exotropia; ET esotropia; PEK punctate epithelial keratitis; PEE punctate epithelial erosions; DES dry eye syndrome; MGD meibomian gland dysfunction; ATs artificial tears; PFAT's preservative free artificial tears; [place] nuclear  sclerotic cataract; PSC posterior subcapsular cataract; ERM epi-retinal membrane; PVD posterior vitreous detachment; RD retinal detachment; DM diabetes mellitus; DR diabetic retinopathy; NPDR non-proliferative diabetic retinopathy; PDR proliferative diabetic retinopathy; CSME clinically significant macular edema; DME diabetic macular edema; dbh dot blot hemorrhages; CWS cotton wool spot; POAG primary open angle glaucoma; C/D cup-to-disc ratio; HVF humphrey visual field; GVF goldmann visual field; OCT optical coherence tomography; IOP intraocular pressure; BRVO Branch retinal vein occlusion; CRVO central retinal vein occlusion; CRAO central retinal artery occlusion; BRAO branch retinal artery occlusion; RT retinal tear; SB scleral buckle; PPV pars plana vitrectomy; VH Vitreous hemorrhage; PRP panretinal laser photocoagulation; IVK intravitreal kenalog; VMT vitreomacular traction; MH Macular hole;  NVD neovascularization of the disc; NVE neovascularization elsewhere; AREDS age related eye disease study; ARMD age related macular degeneration; POAG primary open angle glaucoma; EBMD epithelial/anterior basement membrane dystrophy; ACIOL anterior chamber intraocular lens; IOL intraocular lens; PCIOL posterior chamber intraocular lens; Phaco/IOL phacoemulsification with intraocular lens placement; [place] photorefractive keratectomy; LASIK laser assisted in situ keratomileusis; HTN hypertension; DM diabetes mellitus; COPD chronic obstructive pulmonary disease

## 2021-07-12 NOTE — Telephone Encounter (Signed)
Faxed Certifying Physician docs for theraputic shoes @ [phone] on Friday, 07/09/21 (with confirmation).

## 2021-07-14 ENCOUNTER — Encounter (INDEPENDENT_AMBULATORY_CARE_PROVIDER_SITE_OTHER): Payer: Self-pay | Admitting: Ophthalmology

## 2021-07-14 ENCOUNTER — Other Ambulatory Visit: Payer: Self-pay

## 2021-07-14 ENCOUNTER — Ambulatory Visit (INDEPENDENT_AMBULATORY_CARE_PROVIDER_SITE_OTHER): Payer: Medicare HMO | Admitting: Ophthalmology

## 2021-07-14 DIAGNOSIS — H25813 Combined forms of age-related cataract, bilateral: Secondary | ICD-10-CM

## 2021-07-14 DIAGNOSIS — H35033 Hypertensive retinopathy, bilateral: Secondary | ICD-10-CM

## 2021-07-14 DIAGNOSIS — I10 Essential (primary) hypertension: Secondary | ICD-10-CM | POA: Diagnosis not present

## 2021-07-14 DIAGNOSIS — E113313 Type 2 diabetes mellitus with moderate nonproliferative diabetic retinopathy with macular edema, bilateral: Secondary | ICD-10-CM

## 2021-07-14 MED ORDER — AFLIBERCEPT 2MG/0.05ML IZ SOLN FOR KALEIDOSCOPE
2.0000 mg | INTRAVITREAL | Status: AC | PRN
  Administered 2021-07-14: 2 mg via INTRAVITREAL

## 2021-07-23 ENCOUNTER — Telehealth: Payer: Self-pay

## 2021-07-23 ENCOUNTER — Other Ambulatory Visit: Payer: Self-pay

## 2021-07-23 ENCOUNTER — Encounter: Payer: Self-pay | Admitting: Podiatry

## 2021-07-23 ENCOUNTER — Ambulatory Visit: Payer: Medicare HMO | Admitting: Podiatry

## 2021-07-23 DIAGNOSIS — M79675 Pain in left toe(s): Secondary | ICD-10-CM | POA: Diagnosis not present

## 2021-07-23 DIAGNOSIS — B351 Tinea unguium: Secondary | ICD-10-CM | POA: Diagnosis not present

## 2021-07-23 DIAGNOSIS — M79674 Pain in right toe(s): Secondary | ICD-10-CM | POA: Diagnosis not present

## 2021-07-23 DIAGNOSIS — I739 Peripheral vascular disease, unspecified: Secondary | ICD-10-CM

## 2021-07-23 DIAGNOSIS — E1152 Type 2 diabetes mellitus with diabetic peripheral angiopathy with gangrene: Secondary | ICD-10-CM

## 2021-07-23 DIAGNOSIS — Z89429 Acquired absence of other toe(s), unspecified side: Secondary | ICD-10-CM

## 2021-07-23 NOTE — Telephone Encounter (Signed)
Shoes Ordered - [id number]

## 2021-07-23 NOTE — Telephone Encounter (Signed)
Casts Sent to [facility]  °

## 2021-07-23 NOTE — Progress Notes (Signed)
°  Subjective:  Patient ID: Erin Mitchell, female    DOB: [birth year],   MRN: [id number]  Chief Complaint  Patient presents with   Nail Problem     Routine foot care    77 y.o. female presents for painful elongated and discolored nails that are difficult to trim. Has been in the care of Dr. Jacqualyn Posey. Relates she is unable to trim herself. She is diabetic and last A1c was 6.5. 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 Also has a history of PAD . Denies any other pedal complaints. Denies n/v/f/c.   Past Medical History:  Diagnosis Date   Atrial fibrillation ([place])    on Plavix   Cataract    Diabetes ([place])    Diabetic retinopathy ([place])    Exposure to Agent Orange    Hypertension    Hypertensive retinopathy    hypothyroidism    Hypothyroidism    Kidney disease     Objective:  Physical Exam: Vascular: DP/PT pulses 2/4 bilateral. CFT <3 seconds. Absent hair growth on digits. No edema.  Skin. No lacerations or abrasions bilateral feet. Nails 1-5 are thickened discolored and elongated with subungual debris.  Musculoskeletal: MMT 5/5 bilateral lower extremities in DF, PF, Inversion and Eversion. Deceased ROM in DF of ankle joint. Right second and third digits amputated. Severe bunion of right foot.  Neurological: Sensation intact to light touch.   Assessment:   1. Dermatophytosis of nail   2. Pain in toes of both feet   3. Type 2 diabetes mellitus with diabetic peripheral angiopathy and gangrene, without long-term current use of insulin ([place])   4. PAD (peripheral artery disease) ([place])   5. History of amputation of toe ([place])       Plan:  Patient was evaluated and treated and all questions answered. -Discussed and educated patient on diabetic foot care, especially with  regards to the vascular, neurological and musculoskeletal systems.  -Stressed the importance of good glycemic control and the detriment of not  controlling glucose levels in  relation to the foot. -Discussed supportive shoes at all times and checking feet regularly.  -Mechanically debrided all nails 1-5 bilateral using sterile nail nipper and filed with dremel without incident  -Answered all patient questions -Patient to return  in 3 months for at risk foot care -Patient advised to call the office if any problems or questions arise in the meantime.   Lorenda Peck, DPM

## 2021-08-10 NOTE — Progress Notes (Shared)
[facility] Note  08/18/2021     CHIEF COMPLAINT Patient presents for No chief complaint on file.    HISTORY OF PRESENT ILLNESS: Erin Mitchell is a 77 y.o. female who presents to the clinic today for:     Referring physician: Luetta Nutting, DO [address] [facility] [address]  [address] [place],  [place] [address]  HISTORICAL INFORMATION:   Selected notes from the MEDICAL RECORD NUMBER Referred by Dr. Luetta Nutting for DM exam LEE:  Ocular Hx- PMH- DM, HTN, afib, kidney disease, last a1c was 6.8 on 05.05.22    CURRENT MEDICATIONS: No current outpatient medications on file. (Ophthalmic Drugs)   No current facility-administered medications for this visit. (Ophthalmic Drugs)   Current Outpatient Medications (Other)  Medication Sig   Alcohol Swabs (DROPSAFE ALCOHOL PREP) 70 % PADS USE AS DIRECTED.   Blood Glucose Monitoring Suppl (TRUE METRIX METER) w/Device KIT Use as directed to test blood glucose daily   clopidogrel (PLAVIX) 75 MG tablet TAKE 1 TABLET EVERY DAY   glucose blood (TRUE METRIX BLOOD GLUCOSE TEST) test strip Use as instructed   loperamide (IMODIUM A-D) 2 MG tablet Take 2-4 mg by mouth 4 (four) times daily as needed for diarrhea or loose stools.   losartan (COZAAR) 50 MG tablet Take 1 tablet (50 mg total) by mouth daily.   meclizine (ANTIVERT) 25 MG tablet Take 1 tablet (25 mg total) by mouth 3 (three) times daily as needed for dizziness. (Patient not taking: Reported on 06/02/2021)   metFORMIN (GLUCOPHAGE) 500 MG tablet Take 2 tablets (1,000 mg total) by mouth 2 (two) times daily with a meal.   metoprolol tartrate (LOPRESSOR) 25 MG tablet TAKE 1 TABLET TWICE DAILY WITH FOOD   NP THYROID 90 MG tablet TAKE 1 TABLET (90 MG TOTAL) BY MOUTH DAILY BEFORE BREAKFAST.   PAPAYA ENZYME PO Take 2 capsules by mouth daily as needed (heartburn).   Probiotic Product (ALIGN) 4 MG CAPS Take 4 mg by mouth 2 (two) times daily.   rosuvastatin  (CRESTOR) 40 MG tablet TAKE 1 TABLET BY MOUTH ONCE DAILY (Patient taking differently: Take 40 mg by mouth at bedtime.)   saccharomyces boulardii (FLORASTOR) 250 MG capsule Take 1 capsule (250 mg total) by mouth 2 (two) times daily. (Patient not taking: Reported on 07/14/2021)   TRUEplus Lancets 33G MISC Use as directed daily.   No current facility-administered medications for this visit. (Other)   REVIEW OF SYSTEMS:   ALLERGIES No Known Allergies  PAST MEDICAL HISTORY Past Medical History:  Diagnosis Date   Atrial fibrillation ([place])    on Plavix   Cataract    Diabetes ([place])    Diabetic retinopathy ([place])    Exposure to Agent Orange    Hypertension    Hypertensive retinopathy    hypothyroidism    Hypothyroidism    Kidney disease    Past Surgical History:  Procedure Laterality Date   APPENDECTOMY     CESAREAN SECTION     CHOLECYSTECTOMY     HERNIA REPAIR     TOE AMPUTATION Right    2nd & 3rd   VENTRAL HERNIA REPAIR N/A 12/28/2020   Procedure: LAPAROSCOPIC VENTRAL [place];  Surgeon: Armandina Gemma, MD;  Location: [facility];  Service: General;  Laterality: N/A;   FAMILY HISTORY Family History  Problem Relation Age of Onset   Hypertension Father    Diabetes Brother    Emphysema Mother  SOCIAL HISTORY Social History   Tobacco Use   Smoking status: Former    Packs/day: 0.25    Years: 1.00    Pack years: 0.25    Types: Cigarettes    Quit date: 06/06/1970    Years since quitting: 51.2   Smokeless tobacco: Never  Vaping Use   Vaping Use: Never used  Substance Use Topics   Alcohol use: Never   Drug use: Never       OPHTHALMIC EXAM: Not recorded     IMAGING AND PROCEDURES  Imaging and Procedures for 08/18/2021          ASSESSMENT/PLAN:  No diagnosis found.  1. Moderate non-proliferative diabetic retinopathy, both eyes  - s/p IVE #1 OD (06.22.22)--sample, #2 (09.07.22), #3 (11.14.22), #4 (12.28.22), #5 (02.08.23) - previously  seen at [facility] by Dr. Theora Gianotti (Fax [phone]) - history of anti-VEGF injections since 2019 -- last injection IVE OD Feb/Mar 2022 -- q42mo interval - will [facility] to obtain records from [facility] - exam w/ scattered MA - OCT shows diabetic macular edema, both eyes -- OD: interval increase in IRF and central cyst at 6 wks; OS: Mild interval improvement in IRF/IRHM temporal macula, persistent central VMT - FA (11.14.22) shows OD: Focal hyperflourescence IT to fovea; no NV OS: No NV - BCVA 20/40 OD; 20/20 OS - recommend IVE #6 OD today, 03.15.23 -- f/u in 5-6 wks - pt wishes to proceed - RBA of procedure discussed, questions answered - informed consent obtained and signed - see procedure note - f/u in 5-6 wks -- DFE/OCT, possible injection(s)  2,3. Hypertensive retinopathy OU - discussed importance of tight BP control - monitor  4. Mixed Cataract OU - The symptoms of cataract, surgical options, and treatments and risks were discussed with patient. - discussed diagnosis and progression - not yet visually significant - monitor for now  Ophthalmic Meds Ordered this visit:  No orders of the defined types were placed in this encounter.    No follow-ups on file.  There are no Patient Instructions on file for this visit.   This document serves as a record of services personally performed by Gardiner Sleeper, MD, PhD. It was created on their behalf by Orvan Falconer, an ophthalmic technician. The creation of this record is the provider's dictation and/or activities during the visit.    Electronically signed by: Orvan Falconer, OA, 08/10/21  1:42 PM   Gardiner Sleeper, M.D., Ph.D. Diseases & Surgery of the Retina and Vitreous [facility]  I have reviewed the above documentation for accuracy and completeness, and I agree with the above. Gardiner Sleeper, M.D., Ph.D. 07/14/21 1:42 PM   Abbreviations: M myopia (nearsighted); A  astigmatism; H hyperopia (farsighted); P presbyopia; Mrx spectacle prescription;  CTL contact lenses; OD right eye; OS left eye; OU both eyes  XT exotropia; ET esotropia; PEK punctate epithelial keratitis; PEE punctate epithelial erosions; DES dry eye syndrome; MGD meibomian gland dysfunction; ATs artificial tears; PFAT's preservative free artificial tears; [place] nuclear sclerotic cataract; PSC posterior subcapsular cataract; ERM epi-retinal membrane; PVD posterior vitreous detachment; RD retinal detachment; DM diabetes mellitus; DR diabetic retinopathy; NPDR non-proliferative diabetic retinopathy; PDR proliferative diabetic retinopathy; CSME clinically significant macular edema; DME diabetic macular edema; dbh dot blot hemorrhages; CWS cotton wool spot; POAG primary open angle glaucoma; C/D cup-to-disc ratio; HVF humphrey visual field; GVF goldmann visual field; OCT optical coherence tomography; IOP intraocular pressure; BRVO Branch retinal vein occlusion; CRVO  central retinal vein occlusion; CRAO central retinal artery occlusion; BRAO branch retinal artery occlusion; RT retinal tear; SB scleral buckle; PPV pars plana vitrectomy; VH Vitreous hemorrhage; PRP panretinal laser photocoagulation; IVK intravitreal kenalog; VMT vitreomacular traction; MH Macular hole;  NVD neovascularization of the disc; NVE neovascularization elsewhere; AREDS age related eye disease study; ARMD age related macular degeneration; POAG primary open angle glaucoma; EBMD epithelial/anterior basement membrane dystrophy; ACIOL anterior chamber intraocular lens; IOL intraocular lens; PCIOL posterior chamber intraocular lens; Phaco/IOL phacoemulsification with intraocular lens placement; [place] photorefractive keratectomy; LASIK laser assisted in situ keratomileusis; HTN hypertension; DM diabetes mellitus; COPD chronic obstructive pulmonary disease

## 2021-08-18 ENCOUNTER — Ambulatory Visit (INDEPENDENT_AMBULATORY_CARE_PROVIDER_SITE_OTHER): Payer: Medicare HMO | Admitting: Ophthalmology

## 2021-08-18 ENCOUNTER — Other Ambulatory Visit: Payer: Self-pay

## 2021-08-18 ENCOUNTER — Encounter (INDEPENDENT_AMBULATORY_CARE_PROVIDER_SITE_OTHER): Payer: Self-pay | Admitting: Ophthalmology

## 2021-08-18 DIAGNOSIS — H25813 Combined forms of age-related cataract, bilateral: Secondary | ICD-10-CM | POA: Diagnosis not present

## 2021-08-18 DIAGNOSIS — I10 Essential (primary) hypertension: Secondary | ICD-10-CM

## 2021-08-18 DIAGNOSIS — E113313 Type 2 diabetes mellitus with moderate nonproliferative diabetic retinopathy with macular edema, bilateral: Secondary | ICD-10-CM

## 2021-08-18 DIAGNOSIS — H35033 Hypertensive retinopathy, bilateral: Secondary | ICD-10-CM | POA: Diagnosis not present

## 2021-08-18 MED ORDER — AFLIBERCEPT 2MG/0.05ML IZ SOLN FOR KALEIDOSCOPE
2.0000 mg | INTRAVITREAL | Status: AC | PRN
  Administered 2021-08-18: 2 mg via INTRAVITREAL

## 2021-08-27 ENCOUNTER — Ambulatory Visit: Payer: Medicare HMO | Admitting: Podiatry

## 2021-08-27 ENCOUNTER — Encounter: Payer: Self-pay | Admitting: Podiatry

## 2021-08-27 ENCOUNTER — Telehealth: Payer: Self-pay

## 2021-08-27 ENCOUNTER — Other Ambulatory Visit: Payer: Self-pay

## 2021-08-27 DIAGNOSIS — E1152 Type 2 diabetes mellitus with diabetic peripheral angiopathy with gangrene: Secondary | ICD-10-CM | POA: Diagnosis not present

## 2021-08-27 DIAGNOSIS — L600 Ingrowing nail: Secondary | ICD-10-CM | POA: Diagnosis not present

## 2021-08-27 NOTE — Progress Notes (Signed)
?  Subjective:  ?Patient ID: Erin Mitchell, female    DOB: 30-May-1946,   MRN: [id number] ? ?Chief Complaint  ?Patient presents with  ? Nail Problem  ?   discolored toe on left foot  ? ? ?76 y.o. female presents for concern of discoloration on her left second toe. Relates she has been moving and on her feet more and noticed a few days ago that the second toe was red and swollen. States she does not remember hitting it. However today relates it has improved and not having any issues but still wanted it checked.  . Denies any other pedal complaints. Denies n/v/f/c.  ? ?Past Medical History:  ?Diagnosis Date  ? Atrial fibrillation ([place])   ? on Plavix  ? Cataract   ? Diabetes ([place])   ? Diabetic retinopathy ([place])   ? Exposure to [employer]   ? Hypertension   ? Hypertensive retinopathy   ? hypothyroidism   ? Hypothyroidism   ? Kidney disease   ? ? ?Objective:  ?Physical Exam: ?Vascular: DP/PT pulses 2/4 bilateral. CFT <3 seconds. Normal hair growth on digits. No edema.  ?Skin. No lacerations or abrasions bilateral feet. Mild erythema to medial border of left second digit nail. Overally no edema or erythema. Mild incurvation of medial border of left second digit nail.  ?Musculoskeletal: MMT 5/5 bilateral lower extremities in DF, PF, Inversion and Eversion. Deceased ROM in DF of ankle joint.  ?Neurological: Sensation intact to light touch.  ? ?Assessment:  ? ?1. Dermatophytosis of nail   ?2. Type 2 diabetes mellitus with diabetic peripheral angiopathy and gangrene, without long-term current use of insulin (HCC)   ? ? ? ?Plan:  ?Patient was evaluated and treated and all questions answered. ?Discussed possible ingrown that has improved in the last couple days.  ?Debrided back medial border in slant back fashion of left second toe without incident.  ?No other concerns today.  ?Patient to return as scheduled for rfc.  ? ?Lorenda Peck, DPM  ? ? ?

## 2021-08-27 NOTE — Telephone Encounter (Signed)
Lvm for pt to call back to schedule shoe pick up 

## 2021-09-08 ENCOUNTER — Ambulatory Visit (INDEPENDENT_AMBULATORY_CARE_PROVIDER_SITE_OTHER): Payer: Medicare HMO

## 2021-09-08 DIAGNOSIS — M21611 Bunion of right foot: Secondary | ICD-10-CM | POA: Diagnosis not present

## 2021-09-08 DIAGNOSIS — Z89429 Acquired absence of other toe(s), unspecified side: Secondary | ICD-10-CM

## 2021-09-08 DIAGNOSIS — E1152 Type 2 diabetes mellitus with diabetic peripheral angiopathy with gangrene: Secondary | ICD-10-CM

## 2021-09-08 DIAGNOSIS — I739 Peripheral vascular disease, unspecified: Secondary | ICD-10-CM

## 2021-09-08 NOTE — Progress Notes (Signed)
SITUATION ?Reason for Visit: Fitting of Diabetic [place] ?Patient / Caregiver Report:  Patient is satisfied with fit and function of shoes and insoles. ? ?OBJECTIVE DATA: ?Patient History / Diagnosis:   ?  ICD-10-CM   ?1. Type 2 diabetes mellitus with diabetic peripheral angiopathy and gangrene, without long-term current use of insulin (HCC)  E11.52   ?  ?2. 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  ?3. Bunion, right  M21.611   ?  ?4. PAD (peripheral artery disease) (HCC)  I73.9   ?  ? ? ?Change in Status:   None ? ?ACTIONS PERFORMED: ?In-Person Delivery, patient was fit with: ?- 1x pair [id number] PDAC approved prefabricated Diabetic Shoes: [employer] [id number] ?- 3x pair [id number] PDAC approved vacuum formed custom diabetic insoles; [employer]: [id number] ? ?Shoes and insoles were verified for structural integrity and safety. Patient wore shoes and insoles in office. Skin was inspected and free of areas of concern after wearing shoes and inserts. Shoes and inserts fit properly. Patient / Caregiver provided with ferbal instruction and demonstration regarding donning, doffing, wear, care, proper fit, function, purpose, cleaning, and use of shoes and insoles ' and in all related precautions and risks and benefits regarding shoes and insoles. Patient / Caregiver was instructed to wear properly fitting socks with shoes at all times. Patient was also provided with verbal instruction regarding how to report any failures or malfunctions of shoes or inserts, and necessary follow up care. Patient / Caregiver was also instructed to contact physician regarding change in status that may affect function of shoes and inserts.  ? ?Patient / Caregiver verbalized undersatnding of instruction provided. Patient / Caregiver demonstrated independence with proper donning and doffing of shoes and inserts. ? ?PLAN ?Patient to follow with treating physician as recommended. Plan of care was discussed with and agreed upon by  patient and/or caregiver. All questions were answered and concerns addressed. ? ?

## 2021-09-16 NOTE — Progress Notes (Signed)
?[facility] Note ? ?09/22/2021 ? ?  ? ?CHIEF COMPLAINT ?Patient presents for Retina Follow Up ? ? ? ?HISTORY OF PRESENT ILLNESS: ?Erin Mitchell is a 75 y.o. female who presents to the clinic today for:  ? ?HPI   ? ? Retina Follow Up   ?Patient presents with  Diabetic Retinopathy.  In both eyes.  Duration of 5 weeks.  Since onset it is stable.  I, the attending physician,  performed the HPI with the patient and updated documentation appropriately. ? ?  ?  ? ? Comments   ?5 week follow up NPDR OU- Vision appears stable OU.  Normally in the Spring pt states her vision gets a little foggy.  She denies using eye drops.  ?BS 114 this morning ?A1C 6.5 (6 months ago) ? ?  ?  ?Last edited by Bernarda Caffey, MD on 09/22/2021 12:36 PM.  ?  ?Pt states her vision tends to gets blurry in the spring, she states the bump she sees in straight lines is smaller than it used to be ? ?Referring physician: ?Luetta Nutting, DO ?[address]  ?[address] ?[place],  [place] [address] ? ?HISTORICAL INFORMATION:  ? ?Selected notes from the [facility] ?Referred by Dr. Luetta Nutting for DM exam ?LEE:  ?Ocular Hx- ?PMH- DM, HTN, afib, kidney disease, last a1c was 6.8 on 05.05.22 ?  ? ?CURRENT MEDICATIONS: ?No current outpatient medications on file. (Ophthalmic Drugs)  ? ?No current facility-administered medications for this visit. (Ophthalmic Drugs)  ? ?Current Outpatient Medications (Other)  ?Medication Sig  ? clopidogrel (PLAVIX) 75 MG tablet TAKE 1 TABLET EVERY DAY  ? loperamide (IMODIUM A-D) 2 MG tablet Take 2-4 mg by mouth 4 (four) times daily as needed for diarrhea or loose stools.  ? metFORMIN (GLUCOPHAGE) 500 MG tablet Take 2 tablets (1,000 mg total) by mouth 2 (two) times daily with a meal.  ? metoprolol tartrate (LOPRESSOR) 25 MG tablet TAKE 1 TABLET TWICE DAILY WITH FOOD  ? PAPAYA ENZYME PO Take 2 capsules by mouth daily as needed (heartburn).  ? Probiotic Product (ALIGN) 4 MG CAPS  Take 4 mg by mouth 2 (two) times daily.  ? rosuvastatin (CRESTOR) 40 MG tablet TAKE 1 TABLET BY MOUTH ONCE DAILY (Patient taking differently: Take 40 mg by mouth at bedtime.)  ? Alcohol Swabs (DROPSAFE ALCOHOL PREP) 70 % PADS USE AS DIRECTED.  ? Blood Glucose Monitoring Suppl (TRUE METRIX METER) w/Device KIT Use as directed to test blood glucose daily  ? glucose blood (TRUE METRIX BLOOD GLUCOSE TEST) test strip Use as instructed  ? losartan (COZAAR) 50 MG tablet Take 1 tablet (50 mg total) by mouth daily.  ? meclizine (ANTIVERT) 25 MG tablet Take 1 tablet (25 mg total) by mouth 3 (three) times daily as needed for dizziness. (Patient not taking: Reported on 06/02/2021)  ? NP THYROID 90 MG tablet TAKE 1 TABLET (90 MG TOTAL) BY MOUTH DAILY BEFORE BREAKFAST.  ? saccharomyces boulardii (FLORASTOR) 250 MG capsule Take 1 capsule (250 mg total) by mouth 2 (two) times daily. (Patient not taking: Reported on 07/14/2021)  ? TRUEplus Lancets 33G MISC Use as directed daily.  ? ?No current facility-administered medications for this visit. (Other)  ? ?REVIEW OF SYSTEMS: ?ROS   ?Positive for: Genitourinary, Endocrine, Cardiovascular, Eyes ?Negative for: Constitutional, Gastrointestinal, Neurological, Skin, Musculoskeletal, HENT, Respiratory, Psychiatric, Allergic/Imm, Heme/Lymph ?Last edited by Leonie Douglas, [place] on 09/22/2021 10:03 AM.  ?  ? ?ALLERGIES ?  No Known Allergies ? ?PAST MEDICAL HISTORY ?Past Medical History:  ?Diagnosis Date  ? Atrial fibrillation ([place])   ? on Plavix  ? Cataract   ? Diabetes ([place])   ? Diabetic retinopathy ([place])   ? Exposure to [employer]   ? Hypertension   ? Hypertensive retinopathy   ? hypothyroidism   ? Hypothyroidism   ? Kidney disease   ? ?Past Surgical History:  ?Procedure Laterality Date  ? APPENDECTOMY    ? CESAREAN SECTION    ? CHOLECYSTECTOMY    ? HERNIA REPAIR    ? TOE AMPUTATION Right   ? 2nd & 3rd  ? VENTRAL HERNIA REPAIR N/A 12/28/2020  ? Procedure: LAPAROSCOPIC VENTRAL INCISIONAL HERNIA  REPAIR WITH MESH;  Surgeon: Armandina Gemma, MD;  Location: [facility];  Service: General;  Laterality: N/A;  ? ?FAMILY HISTORY ?Family History  ?Problem Relation Age of Onset  ? Hypertension Father   ? Diabetes Brother   ? Emphysema Mother   ? ? ?SOCIAL HISTORY ?Social History  ? ?Tobacco Use  ? Smoking status: Former  ?  Packs/day: 0.25  ?  Years: 1.00  ?  Pack years: 0.25  ?  Types: Cigarettes  ?  Quit date: 06/06/1970  ?  Years since quitting: 51.3  ? Smokeless tobacco: Never  ?Vaping Use  ? Vaping Use: Never used  ?Substance Use Topics  ? Alcohol use: Never  ? Drug use: Never  ?  ? ?  ?OPHTHALMIC EXAM: ?Base Eye Exam   ? ? Visual Acuity (Snellen - Linear)   ? ?   Right Left  ? Dist cc 20/50 +2 20/20  ? Dist ph cc 20/40 -2   ? ? Correction: Glasses  ? ?  ?  ? ? Tonometry (Tonopen, 10:10 AM)   ? ?   Right Left  ? Pressure 11 8  ? ?  ?  ? ? Pupils   ? ?   Dark Light Shape React APD  ? Right 2 1 Round Minimal None  ? Left 2 1 Round Minimal None  ? ?  ?  ? ? Visual Fields (Counting fingers)   ? ?   Left Right  ?  Full Full  ? ?  ?  ? ? Extraocular Movement   ? ?   Right Left  ?  Full Full  ? ?  ?  ? ? Neuro/Psych   ? ? Oriented x3: Yes  ? Mood/Affect: Normal  ? ?  ?  ? ? Dilation   ? ? Both eyes: 1.0% Mydriacyl, 2.5% Phenylephrine @ 10:10 AM  ? ?  ?  ? ?  ? ?Slit Lamp and Fundus Exam   ? ? Slit Lamp Exam   ? ?   Right Left  ? Lids/Lashes Dermatochalasis - upper lid, Meibomian gland dysfunction Dermatochalasis - upper lid, Meibomian gland dysfunction  ? Conjunctiva/Sclera White and quiet White and quiet  ? Cornea arcus, trace PEE, trace tear film debris arcus, trace PEE  ? Anterior Chamber deep, clear, narrow angles deep, clear, narrow angles  ? Iris Round and moderately dilated, mild anterior bowing Round and moderately dilated, mild anterior bowing  ? Lens 2-3+ Nuclear sclerosis with brunescence, 2+ Cortical cataract 2-3+ Nuclear sclerosis with brunescence, 2+ Cortical cataract  ? Anterior Vitreous Vitreous syneresis,  Posterior vitreous detachment Vitreous syneresis, Posterior vitreous detachment, vitreous condensations  ? ?  ?  ? ? Fundus Exam   ? ?   Right Left  ?  Disc Pink and Sharp, +cupping Pink and Sharp, +cupping  ? C/D Ratio 0.7 0.65  ? Macula Blunted foveal reflex, central edema, RPE mottling, cluster of MA temporal to fovea and temporal macula - slightly improved Flat, Blunted foveal reflex, RPE mottling, focal MA and exudate temp mac  ? Vessels attenuated, Tortuous attenuated, Tortuous, Copper wiring  ? Periphery Attached, mild reticular degeneration, mild MA Attached, mild reticular degeneration, scattered MA, cluster DBH inf to disc  ? ?  ?  ? ?  ? ?Refraction   ? ? Wearing Rx   ? ?   Sphere Cylinder Add  ? Right +1.25 Sphere +2.75  ? Left +1.50 Sphere +2.75  ? ? Type: Bifocal  ? ?  ?  ? ?  ? ?IMAGING AND PROCEDURES  ?Imaging and Procedures for 09/22/2021 ? ?OCT, Retina - OU - Both Eyes   ? ?   ?Right Eye ?Quality was good. Central Foveal Thickness: 466. Progression has been stable. Findings include abnormal foveal contour, intraretinal fluid, no SRF, vitreomacular adhesion , intraretinal hyper-reflective material (Persistent prominent central cyst, mild interval improvement in temporal IRF ).  ? ?Left Eye ?Quality was good. Central Foveal Thickness: 260. Progression has been stable. Findings include abnormal foveal contour, no SRF, intraretinal fluid, vitreomacular adhesion (stable interval improvement in non-central cystic changes temporal macula, persistent central VMT).  ? ?Notes ?*Images captured and stored on drive ? ?Diagnosis / Impression:  ?OD: +central DME, Persistent prominent central cyst, mild interval improvement in temporal IRF; +central VMA ?OS: stable improvement in non-central cystic changes temporal macula, persistent central VMT ? ?Clinical management:  ?See below ? ?Abbreviations: NFP - Normal foveal profile. CME - cystoid macular edema. PED - pigment epithelial detachment. IRF - intraretinal  fluid. SRF - subretinal fluid. EZ - ellipsoid zone. ERM - epiretinal membrane. ORA - outer retinal atrophy. ORT - outer retinal tubulation. SRHM - subretinal hyper-reflective material. IRHM - intraretinal hyper-re

## 2021-09-22 ENCOUNTER — Ambulatory Visit (INDEPENDENT_AMBULATORY_CARE_PROVIDER_SITE_OTHER): Payer: Medicare HMO | Admitting: Ophthalmology

## 2021-09-22 ENCOUNTER — Encounter (INDEPENDENT_AMBULATORY_CARE_PROVIDER_SITE_OTHER): Payer: Self-pay | Admitting: Ophthalmology

## 2021-09-22 DIAGNOSIS — H25813 Combined forms of age-related cataract, bilateral: Secondary | ICD-10-CM | POA: Diagnosis not present

## 2021-09-22 DIAGNOSIS — I10 Essential (primary) hypertension: Secondary | ICD-10-CM | POA: Diagnosis not present

## 2021-09-22 DIAGNOSIS — E113313 Type 2 diabetes mellitus with moderate nonproliferative diabetic retinopathy with macular edema, bilateral: Secondary | ICD-10-CM | POA: Diagnosis not present

## 2021-09-22 DIAGNOSIS — H35033 Hypertensive retinopathy, bilateral: Secondary | ICD-10-CM | POA: Diagnosis not present

## 2021-09-22 LAB — HM DIABETES EYE EXAM

## 2021-09-22 MED ORDER — AFLIBERCEPT 2MG/0.05ML IZ SOLN FOR KALEIDOSCOPE
2.0000 mg | INTRAVITREAL | Status: AC | PRN
  Administered 2021-09-22: 2 mg via INTRAVITREAL

## 2021-09-24 ENCOUNTER — Other Ambulatory Visit: Payer: Self-pay | Admitting: Cardiology

## 2021-10-12 ENCOUNTER — Encounter: Payer: Self-pay | Admitting: Family Medicine

## 2021-10-12 ENCOUNTER — Ambulatory Visit (INDEPENDENT_AMBULATORY_CARE_PROVIDER_SITE_OTHER): Payer: Medicare HMO | Admitting: Family Medicine

## 2021-10-12 VITALS — BP 124/80 | HR 70 | Ht 64.0 in | Wt 159.0 lb

## 2021-10-12 DIAGNOSIS — I10 Essential (primary) hypertension: Secondary | ICD-10-CM | POA: Diagnosis not present

## 2021-10-12 DIAGNOSIS — E039 Hypothyroidism, unspecified: Secondary | ICD-10-CM | POA: Diagnosis not present

## 2021-10-12 DIAGNOSIS — Z89429 Acquired absence of other toe(s), unspecified side: Secondary | ICD-10-CM

## 2021-10-12 DIAGNOSIS — I480 Paroxysmal atrial fibrillation: Secondary | ICD-10-CM

## 2021-10-12 DIAGNOSIS — I739 Peripheral vascular disease, unspecified: Secondary | ICD-10-CM | POA: Diagnosis not present

## 2021-10-12 DIAGNOSIS — Z23 Encounter for immunization: Secondary | ICD-10-CM

## 2021-10-12 DIAGNOSIS — E1152 Type 2 diabetes mellitus with diabetic peripheral angiopathy with gangrene: Secondary | ICD-10-CM | POA: Diagnosis not present

## 2021-10-12 LAB — POCT GLYCOSYLATED HEMOGLOBIN (HGB A1C): HbA1c, POC (controlled diabetic range): 6.6 % (ref 0.0–7.0)

## 2021-10-12 NOTE — Patient Instructions (Signed)
Great to see you today! Continue current medications.  See me again in 6 months or sooner if needed.  

## 2021-10-12 NOTE — Assessment & Plan Note (Signed)
S/p angioplasty. Having some occasional cramping, otherwise doing well.  Would like referral to establish with vascular surgeon here.  ?

## 2021-10-12 NOTE — Progress Notes (Signed)
?Chosen Garron - 76 y.o. female MRN [id number]  Date of birth: [birth year] ? ?Subjective ?Chief Complaint  ?Patient presents with  ? Diabetes  ? ? ?HPI ?Erin Mitchell is a 76 y.o. female here today for follow up visit.  Reports she is doing quite well.  The diarrhea she was experiencing has improved with addition of probiotics.   ? ?Her blood sugars have been doing well at home with metformin.  She has not noted any side effects at this time.  She continues to see ophthalmology for diabetic retinopathy.  ? ?BP remains well controlled. She was started on losartan after her previous surgery and instructed to have potassium checked at her visit today.  Sees cardiology for A.fib.   ? ?She has not seen a vascular specialist since moving here.  History of PAD with angioplasty of lower extremity arteries on the R (unsure what exactly she had done).  She would like to establish with a vascular specialist here.   ? ?ROS:  A comprehensive ROS was completed and negative except as noted per HPI ? ?No Known Allergies ? ?Past Medical History:  ?Diagnosis Date  ? Atrial fibrillation (HCC)   ? on Plavix  ? Cataract   ? Diabetes (HCC)   ? Diabetic retinopathy (HCC)   ? Exposure to [employer]   ? Hypertension   ? Hypertensive retinopathy   ? hypothyroidism   ? Hypothyroidism   ? Kidney disease   ? ? ?Past Surgical History:  ?Procedure Laterality Date  ? APPENDECTOMY    ? CESAREAN SECTION    ? CHOLECYSTECTOMY    ? HERNIA REPAIR    ? TOE AMPUTATION Right   ? 2nd & 3rd  ? VENTRAL HERNIA REPAIR N/A 12/28/2020  ? Procedure: LAPAROSCOPIC VENTRAL INCISIONAL HERNIA REPAIR WITH MESH;  Surgeon: Darnell Level, MD;  Location: [facility];  Service: General;  Laterality: N/A;  ? ? ?Social History  ? ?Socioeconomic History  ? Marital status: Married  ?  Spouse name: Not on file  ? Number of children: 7  ? Years of education: Not on file  ? Highest education level: Not on file  ?Occupational History  ? Occupation: Retired  ?Tobacco Use   ? Smoking status: Former  ?  Packs/day: 0.25  ?  Years: 1.00  ?  Pack years: 0.25  ?  Types: Cigarettes  ?  Quit date: 06/06/1970  ?  Years since quitting: 51.3  ? Smokeless tobacco: Never  ?Vaping Use  ? Vaping Use: Never used  ?Substance and Sexual Activity  ? Alcohol use: Never  ? Drug use: Never  ? Sexual activity: Not on file  ?Other Topics Concern  ? Not on file  ?Social History Narrative  ? Not on file  ? ?Social Determinants of Health  ? ?Financial Resource Strain: Not on file  ?Food Insecurity: Not on file  ?Transportation Needs: Not on file  ?Physical Activity: Not on file  ?Stress: Not on file  ?Social Connections: Not on file  ? ? ?Family History  ?Problem Relation Age of Onset  ? Hypertension Father   ? Diabetes Brother   ? Emphysema Mother   ? ? ?Health Maintenance  ?Topic Date Due  ? Hepatitis C Screening  Never done  ? Zoster Vaccines- Shingrix (1 of 2) Never done  ? COVID-19 Vaccine (3 - Booster for Pfizer series) 09/25/2019  ? Pneumonia Vaccine 44+ Years old (2 - PPSV23 if available, else PCV20) 01/22/2020  ? HEMOGLOBIN A1C  10/12/2021  ? FOOT EXAM  10/16/2021  ? INFLUENZA VACCINE  01/04/2022  ? OPHTHALMOLOGY EXAM  09/23/2022  ? TETANUS/TDAP  08/15/2029  ? HPV VACCINES  Aged Out  ? MAMMOGRAM  Discontinued  ? DEXA SCAN  Discontinued  ? COLONOSCOPY (Pts 45-62yrs Insurance coverage will need to be confirmed)  Discontinued  ? ? ? ?----------------------------------------------------------------------------------------------------------------------------------------------------------------------------------------------------------------- ?Physical Exam ?BP 124/80 (BP Location: Left Arm, Patient Position: Sitting, Cuff Size: Normal)   Pulse 70   Ht 5\' 4"  (1.626 m)   Wt 159 lb (72.1 kg)   SpO2 99%   BMI 27.29 kg/m?  ? ?Physical Exam ?Constitutional:   ?   Appearance: Normal appearance.  ?Eyes:  ?   General: No scleral icterus. ?Cardiovascular:  ?   Rate and Rhythm: Normal rate and regular rhythm.   ?Pulmonary:  ?   Effort: Pulmonary effort is normal.  ?   Breath sounds: Normal breath sounds.  ?Musculoskeletal:  ?   Cervical back: Neck supple.  ?Neurological:  ?   Mental Status: She is alert.  ?Psychiatric:     ?   Mood and Affect: Mood normal.     ?   Behavior: Behavior normal.  ? ? ?------------------------------------------------------------------------------------------------------------------------------------------------------------------------------------------------------------------- ?Assessment and Plan ? ?Hypertension ?BP is well controlled at this time.  Recommend continuation of current medications for management of HTN.   ? ?PAD (peripheral artery disease) (HCC) ?S/p angioplasty. Having some occasional cramping, otherwise doing well.  Would like referral to establish with vascular surgeon here.  ? ?PAF (paroxysmal atrial fibrillation) (HCC) ?Rate controlled.  Continue management per cardiology.   ? ?Type 2 diabetes mellitus with diabetic peripheral angiopathy and gangrene, without long-term current use of insulin (HCC) ?Diabetes is well controlled at this time.  Recommend continuation of metformin at current strength.  ? ?Acquired hypothyroidism ?Continues to feel well with NP thyroid.  Continue at current strength.   ? ? ?No orders of the defined types were placed in this encounter. ? ? ?No follow-ups on file. ? ? ? ?This visit occurred during the SARS-CoV-2 public health emergency.  Safety protocols were in place, including screening questions prior to the visit, additional usage of staff PPE, and extensive cleaning of exam room while observing appropriate contact time as indicated for disinfecting solutions.  ? ?

## 2021-10-12 NOTE — Assessment & Plan Note (Signed)
BP is well controlled at this time.  Recommend continuation of current medications for management of HTN.   

## 2021-10-12 NOTE — Assessment & Plan Note (Signed)
Diabetes is well controlled at this time.  Recommend continuation of metformin at current strength.  ?

## 2021-10-12 NOTE — Assessment & Plan Note (Signed)
Rate controlled.  Continue management per cardiology. 

## 2021-10-12 NOTE — Assessment & Plan Note (Signed)
Continues to feel well with NP thyroid.  Continue at current strength.   ?

## 2021-10-13 LAB — BASIC METABOLIC PANEL
BUN: 18 mg/dL (ref 7–25)
CO2: 23 mmol/L (ref 20–32)
Calcium: 9 mg/dL (ref 8.6–10.4)
Chloride: 106 mmol/L (ref 98–110)
Creat: 0.77 mg/dL (ref 0.60–1.00)
Glucose, Bld: 128 mg/dL — ABNORMAL HIGH (ref 65–99)
Potassium: 4.2 mmol/L (ref 3.5–5.3)
Sodium: 140 mmol/L (ref 135–146)

## 2021-10-13 LAB — MICROALBUMIN / CREATININE URINE RATIO
Creatinine, Urine: 97 mg/dL (ref 20–275)
Microalb Creat Ratio: 777 mcg/mg creat — ABNORMAL HIGH (ref <30)
Microalb, Ur: 75.4 mg/dL

## 2021-10-20 ENCOUNTER — Other Ambulatory Visit: Payer: Self-pay | Admitting: Family Medicine

## 2021-10-20 DIAGNOSIS — I739 Peripheral vascular disease, unspecified: Secondary | ICD-10-CM

## 2021-10-20 DIAGNOSIS — E1152 Type 2 diabetes mellitus with diabetic peripheral angiopathy with gangrene: Secondary | ICD-10-CM

## 2021-10-20 DIAGNOSIS — I480 Paroxysmal atrial fibrillation: Secondary | ICD-10-CM

## 2021-10-21 ENCOUNTER — Encounter: Payer: Self-pay | Admitting: Podiatry

## 2021-10-21 ENCOUNTER — Ambulatory Visit: Payer: Medicare HMO | Admitting: Podiatry

## 2021-10-21 DIAGNOSIS — M79675 Pain in left toe(s): Secondary | ICD-10-CM

## 2021-10-21 DIAGNOSIS — E1152 Type 2 diabetes mellitus with diabetic peripheral angiopathy with gangrene: Secondary | ICD-10-CM | POA: Diagnosis not present

## 2021-10-21 DIAGNOSIS — Z89429 Acquired absence of other toe(s), unspecified side: Secondary | ICD-10-CM

## 2021-10-21 DIAGNOSIS — M79674 Pain in right toe(s): Secondary | ICD-10-CM | POA: Diagnosis not present

## 2021-10-21 DIAGNOSIS — I739 Peripheral vascular disease, unspecified: Secondary | ICD-10-CM

## 2021-10-21 DIAGNOSIS — B351 Tinea unguium: Secondary | ICD-10-CM | POA: Diagnosis not present

## 2021-10-21 NOTE — Progress Notes (Signed)
[facility] Note  10/27/2021     CHIEF COMPLAINT Patient presents for Retina Follow Up  HISTORY OF PRESENT ILLNESS: Erin Mitchell is a 76 y.o. female who presents to the clinic today for:   HPI     Retina Follow Up   Patient presents with  Diabetic Retinopathy (IVE OD #7 (04.19.23)).  In both eyes.  This started months ago.  Duration of 5 weeks.  Since onset it is stable.  I, the attending physician,  performed the HPI with the patient and updated documentation appropriately.        Comments   Patient denies noticing any visual changes since her last visit about 5 weeks ago. Her blood sugar was 132 this morning and her A1C is 6.6.      Last edited by Bernarda Caffey, MD on 10/27/2021 12:16 PM.     Pt states vision is getting worse  Referring physician: Luetta Nutting, DO [address] [facility] [address] [place],  [place] [address]  HISTORICAL INFORMATION:   Selected notes from the MEDICAL RECORD NUMBER Referred by Dr. Luetta Nutting for DM exam LEE:  Ocular Hx- PMH- DM, HTN, afib, kidney disease, last a1c was 6.8 on 05.05.22    CURRENT MEDICATIONS: No current outpatient medications on file. (Ophthalmic Drugs)   No current facility-administered medications for this visit. (Ophthalmic Drugs)   Current Outpatient Medications (Other)  Medication Sig   Alcohol Swabs (DROPSAFE ALCOHOL PREP) 70 % PADS USE AS DIRECTED.   Blood Glucose Monitoring Suppl (TRUE METRIX METER) w/Device KIT Use as directed to test blood glucose daily   clopidogrel (PLAVIX) 75 MG tablet TAKE 1 TABLET EVERY DAY   glucose blood (TRUE METRIX BLOOD GLUCOSE TEST) test strip USE AS INSTRUCTED   loperamide (IMODIUM A-D) 2 MG tablet Take 2-4 mg by mouth 4 (four) times daily as needed for diarrhea or loose stools.   losartan (COZAAR) 50 MG tablet TAKE 1 TABLET EVERY DAY   metFORMIN (GLUCOPHAGE) 500 MG tablet Take 2 tablets (1,000 mg total) by mouth 2 (two) times daily  with a meal.   metoprolol tartrate (LOPRESSOR) 25 MG tablet TAKE 1 TABLET TWICE DAILY WITH FOOD   NP THYROID 90 MG tablet TAKE 1 TABLET (90 MG TOTAL) BY MOUTH DAILY BEFORE BREAKFAST.   PAPAYA ENZYME PO Take 2 capsules by mouth daily as needed (heartburn).   Probiotic Product (ALIGN) 4 MG CAPS Take 4 mg by mouth 2 (two) times daily.   rosuvastatin (CRESTOR) 40 MG tablet TAKE 1 TABLET EVERY DAY   saccharomyces boulardii (FLORASTOR) 250 MG capsule Take 1 capsule (250 mg total) by mouth 2 (two) times daily.   TRUEplus Lancets 33G MISC Use as directed daily.   No current facility-administered medications for this visit. (Other)   REVIEW OF SYSTEMS: ROS   Positive for: Genitourinary, Endocrine, Cardiovascular, Eyes Negative for: Constitutional, Gastrointestinal, Neurological, Skin, Musculoskeletal, HENT, Respiratory, Psychiatric, Allergic/Imm, Heme/Lymph Last edited by Annie Paras, COT on 10/27/2021  9:17 AM.     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   VENTRAL HERNIA REPAIR N/A 12/28/2020   Procedure: LAPAROSCOPIC VENTRAL INCISIONAL HERNIA REPAIR WITH MESH;  Surgeon: Armandina Gemma, MD;  Location: [facility];  Service: General;  Laterality: N/A;   FAMILY HISTORY Family History  Problem Relation Age of Onset   Hypertension Father    Diabetes Brother    Emphysema Mother    SOCIAL HISTORY Social History   Tobacco Use   Smoking status: Former    Packs/day: 0.25    Years: 1.00    Pack years: 0.25    Types: Cigarettes    Quit  date: 06/06/1970    Years since quitting: 51.4   Smokeless tobacco: Never  Vaping Use   Vaping Use: Never used  Substance Use Topics   Alcohol use: Never   Drug use: Never       OPHTHALMIC EXAM: Base Eye Exam     Visual Acuity (Snellen - Linear)       Right Left   Dist cc 20/50 +1 20/20   Dist ph cc NI     Correction: Glasses         Tonometry (Tonopen, 9:23 AM)       Right Left   Pressure 16 14         Pupils       Dark Light Shape React APD   Right 2 1 Round Minimal None   Left 2 1 Round Minimal None         Visual Fields       Left Right    Full Full         Extraocular Movement       Right Left    Full, Ortho Full, Ortho         Neuro/Psych     Oriented x3: Yes   Mood/Affect: Normal         Dilation     Both eyes: 1.0% Mydriacyl, 2.5% Phenylephrine @ 9:20 AM           Slit Lamp and Fundus Exam     Slit Lamp Exam       Right Left   Lids/Lashes Dermatochalasis - upper lid, Meibomian gland dysfunction Dermatochalasis - upper lid, Meibomian gland dysfunction   Conjunctiva/Sclera White and quiet White and quiet   Cornea arcus, 1+ fine PEE, trace tear film debris arcus, trace PEE, tear film debris   Anterior Chamber Moderate depth, clear, narrow angles deep, clear, narrow angles   Iris Round and moderately dilated, mild anterior bowing Round and moderately dilated, mild anterior bowing   Lens 2-3+ Nuclear sclerosis with brunescence, 2-3+ Cortical cataract 2-3+ Nuclear sclerosis with brunescence, 2+ Cortical cataract   Anterior Vitreous Vitreous syneresis, Posterior vitreous detachment Vitreous syneresis, Posterior vitreous detachment, vitreous condensations         Fundus Exam       Right Left   Disc Trace pallor, Sharp rim, +cupping Pink and Sharp, +cupping   C/D Ratio 0.7 0.65   Macula Blunted foveal reflex, central edema, RPE mottling, cluster of MA temporal to fovea and temporal macula - slightly improved, mild ERM Flat,  Blunted foveal reflex, RPE mottling, focal MA and exudate temp mac   Vessels attenuated, mild tortuosity attenuated, mild tortuosity   Periphery Attached, mild reticular degeneration, mild MA Attached, mild  reticular degeneration, scattered MA, cluster DBH inf to disc           Refraction     Wearing Rx       Sphere Cylinder Add   Right +1.25 Sphere +2.75   Left +1.50 Sphere +2.75    Type: Bifocal           IMAGING AND PROCEDURES  Imaging and Procedures for 10/27/2021  Intravitreal Injection, Pharmacologic Agent - OD - Right Eye       Time Out 10/27/2021. 9:43 AM. Confirmed correct patient, procedure, site, and patient consented.   Anesthesia Topical anesthesia was used. Anesthetic medications included Lidocaine 2%, Proparacaine 0.5%.   Procedure Preparation included 5% betadine to ocular surface, eyelid speculum. A (32g) needle was used.   Injection: 2 mg aflibercept 2 MG/0.05ML   Route: Intravitreal, Site: Right Eye   NDC: [id number], Lot: [id number], Expiration date: 09/04/2022, Waste: 0 mL   Post-op Post injection exam found visual acuity of at least counting fingers. The patient tolerated the procedure well. There were no complications. The patient received written and verbal post procedure care education. Post injection medications were not given.            ASSESSMENT/PLAN:    ICD-10-CM   1. Moderate nonproliferative diabetic retinopathy of both eyes with macular edema associated with type 2 diabetes mellitus (HCC)  B34.1937 Intravitreal Injection, Pharmacologic Agent - OD - Right Eye    aflibercept (EYLEA) SOLN 2 mg    2. Essential hypertension  I10     3. Hypertensive retinopathy of both eyes  H35.033     4. Combined forms of age-related cataract of both eyes  H25.813      1. Moderate non-proliferative diabetic retinopathy, both eyes  - s/p IVE #1 OD (06.22.22)--sample, #2 (09.07.22), #3 (11.14.22), #4 (12.28.22), #5 (02.08.23), IVE #6  (03.15.23), #7 (04.19.23) - previously seen at [facility] by Dr. Theora Gianotti (Fax [phone]) - history of anti-VEGF injections since 2019 -- last injection IVE OD Feb/Mar 2022 -- q59mointerval - exam w/ scattered MA - OCT images not obtained today - FA (11.14.22) shows OD: Focal hyperflourescence IT to fovea; no NV OS: No NV - BCVA decreased to 20/50 from 20/40 OD; 20/20 OS -- stable - recommend IVE OD #8 today, 05.24.23 -- f/u again in 5 wks - pt wishes to proceed - RBA of procedure discussed, questions answered - informed consent obtained and signed - see procedure note - f/u in 5 wks -- DFE/OCT, possible injection(s)  2,3. Hypertensive retinopathy OU - discussed importance of tight BP control - monitor  4. Mixed Cataract OU - The symptoms of cataract, surgical options, and treatments and risks were discussed with patient. - discussed diagnosis and progression - approaching visual significance -- pt reports significant glare symptoms at night - monitor  Ophthalmic Meds Ordered this visit:  Meds ordered this encounter  Medications   aflibercept (EYLEA) SOLN 2 mg     Return in about 5 weeks (around 12/01/2021) for f/u NPDR OU, DFE, OCT.  There are no Patient Instructions on file for this visit.   This document serves as a record of services personally performed by BGardiner Sleeper MD, PhD. It was created on their behalf by MOrvan Falconer an ophthalmic technician. The creation of this record is the provider's dictation and/or activities during the visit.    Electronically signed by: MOrvan Falconer OA, 10/27/21  12:17 PM  This document serves as a record of services  personally performed by Gardiner Sleeper, MD, PhD. It was created on their behalf by San Jetty. Owens Shark, OA an ophthalmic technician. The creation of this record is the provider's dictation and/or activities during the visit.    Electronically signed by: San Jetty. Owens Shark, [place] 05.24.2023 12:17  PM  Gardiner Sleeper, M.D., Ph.D. Diseases & Surgery of the Retina and Vitreous [facility]  I have reviewed the above documentation for accuracy and completeness, and I agree with the above. Gardiner Sleeper, M.D., Ph.D. 10/27/21 12:19 PM   Abbreviations: M myopia (nearsighted); A astigmatism; H hyperopia (farsighted); P presbyopia; Mrx spectacle prescription;  CTL contact lenses; OD right eye; OS left eye; OU both eyes  XT exotropia; ET esotropia; PEK punctate epithelial keratitis; PEE punctate epithelial erosions; DES dry eye syndrome; MGD meibomian gland dysfunction; ATs artificial tears; PFAT's preservative free artificial tears; [place] nuclear sclerotic cataract; PSC posterior subcapsular cataract; ERM epi-retinal membrane; PVD posterior vitreous detachment; RD retinal detachment; DM diabetes mellitus; DR diabetic retinopathy; NPDR non-proliferative diabetic retinopathy; PDR proliferative diabetic retinopathy; CSME clinically significant macular edema; DME diabetic macular edema; dbh dot blot hemorrhages; CWS cotton wool spot; POAG primary open angle glaucoma; C/D cup-to-disc ratio; HVF humphrey visual field; GVF goldmann visual field; OCT optical coherence tomography; IOP intraocular pressure; BRVO Branch retinal vein occlusion; CRVO central retinal vein occlusion; CRAO central retinal artery occlusion; BRAO branch retinal artery occlusion; RT retinal tear; SB scleral buckle; PPV pars plana vitrectomy; VH Vitreous hemorrhage; PRP panretinal laser photocoagulation; IVK intravitreal kenalog; VMT vitreomacular traction; MH Macular hole;  NVD neovascularization of the disc; NVE neovascularization elsewhere; AREDS age related eye disease study; ARMD age related macular degeneration; POAG primary open angle glaucoma; EBMD epithelial/anterior basement membrane dystrophy; ACIOL anterior chamber intraocular lens; IOL intraocular lens; PCIOL posterior chamber intraocular lens; Phaco/IOL  phacoemulsification with intraocular lens placement; [place] photorefractive keratectomy; LASIK laser assisted in situ keratomileusis; HTN hypertension; DM diabetes mellitus; COPD chronic obstructive pulmonary disease

## 2021-10-21 NOTE — Progress Notes (Signed)
  Subjective:  Patient ID: Erin Mitchell, female    DOB: Sep 21, 1945,   MRN: [id number]  Chief Complaint  Patient presents with   Nail Problem    Routine foot care    76 y.o. female presents for painful elongated and discolored nails that are difficult to trim.  Relates she is unable to trim herself. She is diabetic and last A1c was 6.7. 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 Also has a history of PAD . Denies any other pedal complaints. Denies n/v/f/c.   Past Medical History:  Diagnosis Date   Atrial fibrillation ([place])    on Plavix   Cataract    Diabetes ([place])    Diabetic retinopathy ([place])    Exposure to Agent Orange    Hypertension    Hypertensive retinopathy    hypothyroidism    Hypothyroidism    Kidney disease     Objective:  Physical Exam: Vascular: DP/PT pulses 2/4 bilateral. CFT <3 seconds. Absent hair growth on digits. No edema.  Skin. No lacerations or abrasions bilateral feet. Nails 1-5 are thickened discolored and elongated with subungual debris.  Musculoskeletal: MMT 5/5 bilateral lower extremities in DF, PF, Inversion and Eversion. Deceased ROM in DF of ankle joint. Right second and third digits amputated. Severe bunion of right foot.  Neurological: Sensation intact to light touch.   Assessment:   1. Type 2 diabetes mellitus with diabetic peripheral angiopathy and gangrene, without long-term current use of insulin ([place])   2. History of amputation of toe ([place])   3. PAD (peripheral artery disease) (HCC)   4. Pain in toes of both feet   5. Dermatophytosis of nail        Plan:  Patient was evaluated and treated and all questions answered. -Discussed and educated patient on diabetic foot care, especially with  regards to the vascular, neurological and musculoskeletal systems.  -Stressed the importance of good glycemic control and the detriment of not  controlling glucose levels in relation to the foot. -Discussed  supportive shoes at all times and checking feet regularly.  -Mechanically debrided all nails 1-5 bilateral using sterile nail nipper and filed with dremel without incident  -Answered all patient questions -Patient to return  in 3 months for at risk foot care -Patient advised to call the office if any problems or questions arise in the meantime.   Lorenda Peck, DPM

## 2021-10-24 ENCOUNTER — Other Ambulatory Visit: Payer: Self-pay | Admitting: Family Medicine

## 2021-10-27 ENCOUNTER — Ambulatory Visit (INDEPENDENT_AMBULATORY_CARE_PROVIDER_SITE_OTHER): Payer: Medicare HMO | Admitting: Ophthalmology

## 2021-10-27 ENCOUNTER — Encounter (INDEPENDENT_AMBULATORY_CARE_PROVIDER_SITE_OTHER): Payer: Self-pay | Admitting: Ophthalmology

## 2021-10-27 DIAGNOSIS — H35033 Hypertensive retinopathy, bilateral: Secondary | ICD-10-CM

## 2021-10-27 DIAGNOSIS — H25813 Combined forms of age-related cataract, bilateral: Secondary | ICD-10-CM

## 2021-10-27 DIAGNOSIS — I10 Essential (primary) hypertension: Secondary | ICD-10-CM

## 2021-10-27 DIAGNOSIS — E113313 Type 2 diabetes mellitus with moderate nonproliferative diabetic retinopathy with macular edema, bilateral: Secondary | ICD-10-CM

## 2021-10-27 MED ORDER — AFLIBERCEPT 2MG/0.05ML IZ SOLN FOR KALEIDOSCOPE
2.0000 mg | INTRAVITREAL | Status: AC | PRN
  Administered 2021-10-27: 2 mg via INTRAVITREAL

## 2021-11-11 ENCOUNTER — Encounter: Payer: Self-pay | Admitting: Vascular Surgery

## 2021-11-11 ENCOUNTER — Ambulatory Visit: Payer: Medicare HMO | Admitting: Vascular Surgery

## 2021-11-11 ENCOUNTER — Other Ambulatory Visit: Payer: Self-pay | Admitting: *Deleted

## 2021-11-11 ENCOUNTER — Ambulatory Visit (HOSPITAL_COMMUNITY)
Admission: RE | Admit: 2021-11-11 | Discharge: 2021-11-11 | Disposition: A | Discharge status: Home / Self Care | Payer: Medicare HMO | Source: Ambulatory Visit | Attending: Vascular Surgery | Admitting: Vascular Surgery

## 2021-11-11 VITALS — BP 184/97 | HR 66 | Temp 97.9°F | Resp 20 | Ht 64.0 in | Wt 160.0 lb

## 2021-11-11 DIAGNOSIS — I739 Peripheral vascular disease, unspecified: Secondary | ICD-10-CM | POA: Diagnosis not present

## 2021-11-11 NOTE — Progress Notes (Signed)
ASSESSMENT & PLAN   PERIPHERAL ARTERIAL DISEASE: This patient underwent angioplasty I believe in the right superficial femoral artery in [place] a couple years ago.  She does not remember the exact details.  However she has a palpable posterior tibial pulse on the right so I think her stents are patent.  She is on Plavix and a statin.  Her noninvasive studies show excellent perfusion on the right.  I think we simply need to follow her PAD so I have ordered a duplex of the right lower extremity and ABIs in 1 year.  She knows to call sooner if she has problems.  Fortunately she is not a smoker.  She is on Plavix and is on a statin.  Her blood pressure was elevated today however she states that she has a whitecoat syndrome and actually her blood pressure was before she came to the office today.  She seems very reliable.  I will see her back in 1 year.   REASON FOR CONSULT:    To establish care for peripheral arterial disease  HPI:   Erin Mitchell is a 76 y.o. female who was referred by Dr. Luetta Nutting with peripheral arterial disease.  I did see the a recent note from podiatry on 10/21/2021.  The patient has a history of type 2 diabetes and has had previous amputation of the right second and third toes.  On my history, the patient underwent angioplasty of the right lower extremity in [place] about 3 years ago.  It was around that time that she had amputation of her second and third toes on the right foot.  She needed to establish vascular care here in [place] and was set up for this appointment.  I do not get any history of claudication, rest pain, or recent nonhealing ulcers.  Her risk factors for peripheral arterial disease include type 2 diabetes, hypertension, hypercholesterolemia, and a remote history of tobacco use.  She quit in 1972.  She denies any family history of premature cardiovascular disease.  She tells me that she has stage II chronic kidney disease.  She  does have neuropathy in her feet.  Past Medical History:  Diagnosis Date   Atrial fibrillation ([place])    on Plavix   Cataract    Diabetes ([place])    Diabetic retinopathy ([place])    Exposure to Agent Orange    Hypertension    Hypertensive retinopathy    hypothyroidism    Hypothyroidism    Kidney disease     Family History  Problem Relation Age of Onset   Hypertension Father    Diabetes Brother    Emphysema Mother     SOCIAL HISTORY: Social History   Tobacco Use   Smoking status: Former    Packs/day: 0.25    Years: 1.00    Total pack years: 0.25    Types: Cigarettes    Quit date: 06/06/1970    Years since quitting: 51.4   Smokeless tobacco: Never  Substance Use Topics   Alcohol use: Never    No Known Allergies  Current Outpatient Medications  Medication Sig Dispense Refill   Alcohol Swabs (DROPSAFE ALCOHOL PREP) 70 % PADS USE AS DIRECTED. 300 each 2   Blood Glucose Monitoring Suppl (TRUE METRIX METER) w/Device KIT Use as directed to test blood glucose daily 1 kit 0   clopidogrel (PLAVIX) 75 MG tablet TAKE 1 TABLET EVERY DAY 90 tablet 3   glucose blood (TRUE METRIX BLOOD GLUCOSE TEST) test strip USE  AS INSTRUCTED 100 strip 2   loperamide (IMODIUM A-D) 2 MG tablet Take 2-4 mg by mouth 4 (four) times daily as needed for diarrhea or loose stools.     losartan (COZAAR) 50 MG tablet TAKE 1 TABLET EVERY DAY 90 tablet 3   metFORMIN (GLUCOPHAGE) 500 MG tablet Take 2 tablets (1,000 mg total) by mouth 2 (two) times daily with a meal. 360 tablet 3   metoprolol tartrate (LOPRESSOR) 25 MG tablet TAKE 1 TABLET TWICE DAILY WITH FOOD 180 tablet 0   NP THYROID 90 MG tablet TAKE 1 TABLET (90 MG TOTAL) BY MOUTH DAILY BEFORE BREAKFAST. 90 tablet 0   PAPAYA ENZYME PO Take 2 capsules by mouth daily as needed (heartburn).     Probiotic Product (ALIGN) 4 MG CAPS Take 4 mg by mouth 2 (two) times daily.     rosuvastatin (CRESTOR) 40 MG tablet TAKE 1 TABLET EVERY DAY 90 tablet 3   saccharomyces  boulardii (FLORASTOR) 250 MG capsule Take 1 capsule (250 mg total) by mouth 2 (two) times daily. 60 capsule 0   TRUEplus Lancets 33G MISC Use as directed daily. 100 each 3   No current facility-administered medications for this visit.    REVIEW OF SYSTEMS:  _0  denotes positive finding, _1  denotes negative finding Cardiac  Comments:  Chest pain or chest pressure:    Shortness of breath upon exertion:    Short of breath when lying flat:    Irregular heart rhythm:        Vascular    Pain in calf, thigh, or hip brought on by ambulation:    Pain in feet at night that wakes you up from your sleep:     Blood clot in your veins:    Leg swelling:         Pulmonary    Oxygen at home:    Productive cough:     Wheezing:         Neurologic    Sudden weakness in arms or legs:     Sudden numbness in arms or legs:     Sudden onset of difficulty speaking or slurred speech:    Temporary loss of vision in one eye:     Problems with dizziness:         Gastrointestinal    Blood in stool:     Vomited blood:         Genitourinary    Burning when urinating:     Blood in urine:        Psychiatric    Major depression:         Hematologic    Bleeding problems:    Problems with blood clotting too easily:        Skin    Rashes or ulcers:        Constitutional    Fever or chills:    -  PHYSICAL EXAM:   Vitals:   11/11/21 1026  BP: (!) 184/97  Pulse: 66  Resp: 20  Temp: 97.9 F (36.6 C)  SpO2: 98%  Weight: 160 lb (72.6 kg)  Height: _2  (1.626 m)   Body mass index is 27.46 kg/m.  GENERAL: The patient is a well-nourished female, in no acute distress. The vital signs are documented above. CARDIAC: There is a regular rate and rhythm.  VASCULAR: I do not detect carotid bruits. She has palpable femoral and posterior tibial pulses bilaterally. PULMONARY: There is good air exchange bilaterally without wheezing or rales.  ABDOMEN: Soft and non-tender with normal pitched bowel  sounds.  MUSCULOSKELETAL: She has had amputation of the right second and third toes back in 2021. NEUROLOGIC: No focal weakness or paresthesias are detected. SKIN: There are no ulcers or rashes noted. PSYCHIATRIC: The patient has a normal affect.  DATA:    ARTERIAL DOPPLER STUDY: I have independently interpreted her arterial Doppler study today.  On the right side there is a biphasic posterior tibial signal with a monophasic dorsalis pedis signal.  ABI is 100%.  Toe pressures 157 mmHg.  On the left side there is a biphasic dorsalis pedis and posterior tibial signal.  The arteries are not compressible as they are calcified.  The great toe pressure is 114 mmHg.  Deitra Mayo [facility]

## 2021-11-24 NOTE — Progress Notes (Signed)
[facility] Note  12/01/2021     CHIEF COMPLAINT Patient presents for Retina Follow Up  HISTORY OF PRESENT ILLNESS: Erin Mitchell is a 76 y.o. female who presents to the clinic today for:   HPI     Retina Follow Up   Patient presents with  Diabetic Retinopathy.  In both eyes.  Severity is moderate.  Duration of 5 weeks.  Since onset it is stable.  I, the attending physician,  performed the HPI with the patient and updated documentation appropriately.        Comments   Pt here for 5 wk ret f/u for NPDR OU. Pt states [facility] the same. She is due to have annual eye exam and wondering if it is a good time for her to do so.       Last edited by Bernarda Caffey, MD on 12/01/2021 12:22 PM.    Pt states her blood sugar has been up and down, her BP has been low-normal, she has been using AT's at least every other day   Referring physician: Luetta Nutting, [address],  [place] [address]  HISTORICAL INFORMATION:   Selected notes from the [place] Referred by Dr. Luetta Nutting for DM exam LEE:  Ocular Hx- PMH- DM, HTN, afib, kidney disease, last a1c was 6.8 on 05.05.22    CURRENT MEDICATIONS: No current outpatient medications on file. (Ophthalmic Drugs)   No current facility-administered medications for this visit. (Ophthalmic Drugs)   Current Outpatient Medications (Other)  Medication Sig   Alcohol Swabs (DROPSAFE ALCOHOL PREP) 70 % PADS USE AS DIRECTED.   Blood Glucose Monitoring Suppl (TRUE METRIX METER) w/Device KIT Use as directed to test blood glucose daily   clopidogrel (PLAVIX) 75 MG tablet TAKE 1 TABLET EVERY DAY   glucose blood (TRUE METRIX BLOOD GLUCOSE TEST) test strip USE AS INSTRUCTED   loperamide (IMODIUM A-D) 2 MG tablet Take 2-4 mg by mouth 4 (four) times daily as needed for diarrhea or loose stools.   losartan (COZAAR) 50 MG tablet TAKE 1 TABLET EVERY DAY   metFORMIN (GLUCOPHAGE)  500 MG tablet Take 2 tablets (1,000 mg total) by mouth 2 (two) times daily with a meal.   metoprolol tartrate (LOPRESSOR) 25 MG tablet TAKE 1 TABLET TWICE DAILY WITH FOOD   NP THYROID 90 MG tablet TAKE 1 TABLET (90 MG TOTAL) BY MOUTH DAILY BEFORE BREAKFAST.   PAPAYA ENZYME PO Take 2 capsules by mouth daily as needed (heartburn).   Probiotic Product (ALIGN) 4 MG CAPS Take 4 mg by mouth 2 (two) times daily.   rosuvastatin (CRESTOR) 40 MG tablet TAKE 1 TABLET EVERY DAY   saccharomyces boulardii (FLORASTOR) 250 MG capsule Take 1 capsule (250 mg total) by mouth 2 (two) times daily.   TRUEplus Lancets 33G MISC Use as directed daily.   No current facility-administered medications for this visit. (Other)   REVIEW OF SYSTEMS: ROS   Positive for: Genitourinary, Endocrine, Cardiovascular, Eyes Negative for: Constitutional, Gastrointestinal, Neurological, Skin, Musculoskeletal, HENT, Respiratory, Psychiatric, Allergic/Imm, Heme/Lymph Last edited by Kingsley Spittle, COT on 12/01/2021  9:26 AM.     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   VENTRAL HERNIA REPAIR N/A 12/28/2020   Procedure: LAPAROSCOPIC VENTRAL INCISIONAL HERNIA REPAIR WITH MESH;  Surgeon: Armandina Gemma, MD;  Location: [facility];  Service: General;  Laterality: N/A;   FAMILY HISTORY Family History  Problem Relation Age of Onset   Hypertension Father    Diabetes Brother    Emphysema Mother    SOCIAL HISTORY Social History   Tobacco Use   Smoking status: Former    Packs/day: 0.25     Years: 1.00    Total pack years: 0.25    Types: Cigarettes    Quit date: 06/06/1970    Years since quitting: 51.5   Smokeless tobacco: Never  Vaping Use   Vaping Use: Never used  Substance Use Topics   Alcohol use: Never   Drug use: Never       OPHTHALMIC EXAM: Base Eye Exam     Visual Acuity (Snellen - Linear)       Right Left   Dist cc 20/50 20/20   Dist ph cc 20/40 -2     Correction: Glasses         Tonometry (Tonopen, 9:31 AM)       Right Left   Pressure 9 9         Pupils       Dark Light Shape React APD   Right 2 1 Round Minimal None   Left 2 1 Round Minimal None         Visual Fields (Counting fingers)       Left Right    Full Full         Extraocular Movement       Right Left    Full, Ortho Full, Ortho         Neuro/Psych     Oriented x3: Yes   Mood/Affect: Normal         Dilation     Both eyes: 1.0% Mydriacyl, 2.5% Phenylephrine @ 9:32 AM           Slit Lamp and Fundus Exam     Slit Lamp Exam       Right Left   Lids/Lashes Dermatochalasis - upper lid, Meibomian gland dysfunction Dermatochalasis - upper lid, Meibomian gland dysfunction   Conjunctiva/Sclera White and quiet White and quiet   Cornea arcus, 1+ fine PEE, trace tear film debris arcus, trace PEE, tear film debris   Anterior Chamber Moderate depth, clear, narrow angles deep, clear, narrow angles   Iris Round and moderately dilated, mild anterior bowing Round and moderately dilated, mild anterior bowing   Lens 2-3+ Nuclear sclerosis with brunescence, 2-3+ Cortical cataract 2-3+ Nuclear sclerosis with brunescence, 2+ Cortical cataract   Anterior Vitreous Vitreous syneresis, Posterior vitreous detachment Vitreous syneresis, Posterior vitreous detachment, vitreous condensations         Fundus Exam       Right Left   Disc Trace pallor, Sharp rim, +cupping Pink and Sharp, +cupping   C/D Ratio 0.7 0.65   Macula Blunted foveal reflex, central edema, RPE  mottling, cluster of MA temporal to fovea and temporal macula, mild ERM Flat, Blunted foveal reflex, RPE mottling, focal MA  temp mac, exudates improved   Vessels attenuated, Tortuous attenuated, mild tortuosity   Periphery Attached, mild reticular degeneration, mild MA Attached, mild reticular degeneration, scattered MA, cluster of DBH inf to disc           Refraction     Wearing Rx       Sphere Cylinder Add   Right +1.25 Sphere +2.75   Left +1.50 Sphere +2.75    Type: Bifocal           IMAGING AND PROCEDURES  Imaging and Procedures for 12/01/2021  OCT, Retina - OU - Both Eyes       Right Eye Quality was good. Central Foveal Thickness: 480. Progression has worsened. Findings include no SRF, abnormal foveal contour, intraretinal hyper-reflective material, intraretinal fluid, vitreomacular adhesion (Persistent prominent central cyst, mild interval increase in temporal IRF ).   Left Eye Quality was good. Central Foveal Thickness: 259. Progression has worsened. Findings include no SRF, abnormal foveal contour, intraretinal fluid, vitreomacular adhesion (Mild interval increase in non-central cystic changes temporal macula, persistent central VMT).   Notes *Images captured and stored on drive  Diagnosis / Impression:  OD: +central DME, Persistent prominent central cyst, mild interval increase in temporal IRF ; +central VMA OS: Mild interval increase in non-central cystic changes temporal macula, persistent central VMT  Clinical management:  See below  Abbreviations: NFP - Normal foveal profile. CME - cystoid macular edema. PED - pigment epithelial detachment. IRF - intraretinal fluid. SRF - subretinal fluid. EZ - ellipsoid zone. ERM - epiretinal membrane. ORA - outer retinal atrophy. ORT - outer retinal tubulation. SRHM - subretinal hyper-reflective material. IRHM - intraretinal hyper-reflective material      Intravitreal Injection, Pharmacologic Agent - OD - Right Eye        Time Out 12/01/2021. 10:09 AM. Confirmed correct patient, procedure, site, and patient consented.   Anesthesia Topical anesthesia was used. Anesthetic medications included Lidocaine 2%, Proparacaine 0.5%.   Procedure Preparation included 5% betadine to ocular surface, eyelid speculum. A (32g) needle was used.   Injection: 2 mg aflibercept 2 MG/0.05ML   Route: Intravitreal, Site: Right Eye   NDC: [id number], Lot: [id number], Expiration date: 09/04/2022, Waste: 0 mL   Post-op Post injection exam found visual acuity of at least counting fingers. The patient tolerated the procedure well. There were no complications. The patient received written and verbal post procedure care education. Post injection medications were not given.            ASSESSMENT/PLAN:    ICD-10-CM   1. Moderate nonproliferative diabetic retinopathy of both eyes with macular edema associated with type 2 diabetes mellitus (HCC)  E11.3313 OCT, Retina - OU - Both Eyes    Intravitreal Injection, Pharmacologic Agent - OD - Right Eye    aflibercept (EYLEA) SOLN 2 mg    2. Essential hypertension  I10     3. Hypertensive retinopathy of both eyes  H35.033     4. Combined forms of age-related cataract of both eyes  H25.813      1. Moderate non-proliferative diabetic retinopathy, both eyes  - s/p IVE #1 OD (06.22.22)--sample, #2 (09.07.22), #3 (11.14.22), #4 (12.28.22), #5 (02.08.23), IVE #6 (03.15.23), #7 (04.19.23), #8 (05.24.23) - previously seen at [facility] by Dr. Theora Gianotti (Fax [phone]) - history of anti-VEGF injections since 2019 -- last injection IVE OD Feb/Mar 2022 -- q33mointerval - exam w/ scattered MA - OCT shows OD: +central DME, Persistent prominent central cyst, mild interval increase in  temporal IRF ; OS: Mild interval increase in non-central cystic changes temporal macula at 5 weeks - FA (11.14.22) shows OD: Focal hyperflourescence IT to fovea; no NV OS: No NV - BCVA improved  to 20/40 from 20/50 OD; 20/20 OS -- stable - recommend IVE OD #9 today, 06.28.23 -- f/u again in 4-5 wks - pt wishes to proceed - RBA of procedure discussed, questions answered - IVE informed consent obtained and re-signed today, 06.28.23 - see procedure note - f/u in 4-5 wks -- DFE/OCT, possible injection(s)  2,3. Hypertensive retinopathy OU - discussed importance of tight BP control - monitor  4. Mixed Cataract OU - The symptoms of cataract, surgical options, and treatments and risks were discussed with patient. - discussed diagnosis and progression - approaching visual significance -- pt reports significant glare symptoms at night - pt wishes to continue to monitor for now  Ophthalmic Meds Ordered this visit:  Meds ordered this encounter  Medications   aflibercept (EYLEA) SOLN 2 mg     Return for f/u 4-5 weeks, NPDR OU, DFE, OCT.  There are no Patient Instructions on file for this visit.   This document serves as a record of services personally performed by Gardiner Sleeper, MD, PhD. It was created on their behalf by Orvan Falconer, an ophthalmic technician. The creation of this record is the provider's dictation and/or activities during the visit.    Electronically signed by: Orvan Falconer, OA, 12/01/21  12:23 PM  This document serves as a record of services personally performed by Gardiner Sleeper, MD, PhD. It was created on their behalf by San Jetty. Owens Shark, OA an ophthalmic technician. The creation of this record is the provider's dictation and/or activities during the visit.    Electronically signed by: San Jetty. Marguerita Merles 06.28.2023 12:23 PM  Gardiner Sleeper, M.D., Ph.D. Diseases & Surgery of the Retina and Vitreous [facility]  I have reviewed the above documentation for accuracy and completeness, and I agree with the above. Gardiner Sleeper, M.D., Ph.D. 12/01/21 12:25 PM   Abbreviations: M myopia (nearsighted); A astigmatism; H hyperopia  (farsighted); P presbyopia; Mrx spectacle prescription;  CTL contact lenses; OD right eye; OS left eye; OU both eyes  XT exotropia; ET esotropia; PEK punctate epithelial keratitis; PEE punctate epithelial erosions; DES dry eye syndrome; MGD meibomian gland dysfunction; ATs artificial tears; PFAT's preservative free artificial tears; [place] nuclear sclerotic cataract; PSC posterior subcapsular cataract; ERM epi-retinal membrane; PVD posterior vitreous detachment; RD retinal detachment; DM diabetes mellitus; DR diabetic retinopathy; NPDR non-proliferative diabetic retinopathy; PDR proliferative diabetic retinopathy; CSME clinically significant macular edema; DME diabetic macular edema; dbh dot blot hemorrhages; CWS cotton wool spot; POAG primary open angle glaucoma; C/D cup-to-disc ratio; HVF humphrey visual field; GVF goldmann visual field; OCT optical coherence tomography; IOP intraocular pressure; BRVO Branch retinal vein occlusion; CRVO central retinal vein occlusion; CRAO central retinal artery occlusion; BRAO branch retinal artery occlusion; RT retinal tear; SB scleral buckle; PPV pars plana vitrectomy; VH Vitreous hemorrhage; PRP panretinal laser photocoagulation; IVK intravitreal kenalog; VMT vitreomacular traction; MH Macular hole;  NVD neovascularization of the disc; NVE neovascularization elsewhere; AREDS age related eye disease study; ARMD age related macular degeneration; POAG primary open angle glaucoma; EBMD epithelial/anterior basement membrane dystrophy; ACIOL anterior chamber intraocular lens; IOL intraocular lens; PCIOL posterior chamber intraocular lens; Phaco/IOL phacoemulsification with intraocular lens placement; [place] photorefractive keratectomy; LASIK laser assisted in situ keratomileusis; HTN hypertension; DM diabetes mellitus; COPD chronic obstructive pulmonary disease

## 2021-12-01 ENCOUNTER — Ambulatory Visit (INDEPENDENT_AMBULATORY_CARE_PROVIDER_SITE_OTHER): Payer: Medicare HMO | Admitting: Ophthalmology

## 2021-12-01 ENCOUNTER — Encounter (INDEPENDENT_AMBULATORY_CARE_PROVIDER_SITE_OTHER): Payer: Self-pay | Admitting: Ophthalmology

## 2021-12-01 DIAGNOSIS — E113313 Type 2 diabetes mellitus with moderate nonproliferative diabetic retinopathy with macular edema, bilateral: Secondary | ICD-10-CM

## 2021-12-01 DIAGNOSIS — H25813 Combined forms of age-related cataract, bilateral: Secondary | ICD-10-CM

## 2021-12-01 DIAGNOSIS — I10 Essential (primary) hypertension: Secondary | ICD-10-CM

## 2021-12-01 DIAGNOSIS — H35033 Hypertensive retinopathy, bilateral: Secondary | ICD-10-CM | POA: Diagnosis not present

## 2021-12-01 MED ORDER — AFLIBERCEPT 2MG/0.05ML IZ SOLN FOR KALEIDOSCOPE
2.0000 mg | INTRAVITREAL | Status: AC | PRN
  Administered 2021-12-01: 2 mg via INTRAVITREAL

## 2021-12-17 NOTE — Progress Notes (Signed)
[facility] Note  12/30/2021     CHIEF COMPLAINT Patient presents for Retina Follow Up  HISTORY OF PRESENT ILLNESS: Erin Mitchell is a 76 y.o. female who presents to the clinic today for:   HPI     Retina Follow Up   Patient presents with  Diabetic Retinopathy (IVE OD 06.28.23).  In both eyes.  Severity is moderate.  Duration of 5 weeks.  Since onset it is stable.  I, the attending physician,  performed the HPI with the patient and updated documentation appropriately.        Comments   Patient feels that its harder to focus.       Last edited by Bernarda Caffey, MD on 12/30/2021  1:47 PM.     Pt states she feels shes having more trouble focusing OD.   Referring physician: Luetta Nutting, DO [address]  [address] [place],  [place] [address]  HISTORICAL INFORMATION:   Selected notes from the MEDICAL RECORD NUMBER Referred by Dr. Luetta Nutting for DM exam LEE:  Ocular Hx- PMH- DM, HTN, afib, kidney disease, last a1c was 6.8 on 05.05.22    CURRENT MEDICATIONS: No current outpatient medications on file. (Ophthalmic Drugs)   No current facility-administered medications for this visit. (Ophthalmic Drugs)   Current Outpatient Medications (Other)  Medication Sig   Alcohol Swabs (DROPSAFE ALCOHOL PREP) 70 % PADS USE AS DIRECTED.   Blood Glucose Monitoring Suppl (TRUE METRIX METER) w/Device KIT Use as directed to test blood glucose daily   clopidogrel (PLAVIX) 75 MG tablet TAKE 1 TABLET EVERY DAY   glucose blood (TRUE METRIX BLOOD GLUCOSE TEST) test strip USE AS INSTRUCTED   loperamide (IMODIUM A-D) 2 MG tablet Take 2-4 mg by mouth 4 (four) times daily as needed for diarrhea or loose stools.   losartan (COZAAR) 50 MG tablet TAKE 1 TABLET EVERY DAY   metFORMIN (GLUCOPHAGE) 500 MG tablet Take 2 tablets (1,000 mg total) by mouth 2 (two) times daily with a meal.   metoprolol tartrate (LOPRESSOR) 25 MG tablet TAKE 1 TABLET TWICE  DAILY WITH FOOD   NP THYROID 90 MG tablet TAKE 1 TABLET (90 MG TOTAL) BY MOUTH DAILY BEFORE BREAKFAST.   PAPAYA ENZYME PO Take 2 capsules by mouth daily as needed (heartburn).   Probiotic Product (ALIGN) 4 MG CAPS Take 4 mg by mouth 2 (two) times daily.   rosuvastatin (CRESTOR) 40 MG tablet TAKE 1 TABLET EVERY DAY   saccharomyces boulardii (FLORASTOR) 250 MG capsule Take 1 capsule (250 mg total) by mouth 2 (two) times daily.   TRUEplus Lancets 33G MISC Use as directed daily.   No current facility-administered medications for this visit. (Other)   REVIEW OF SYSTEMS: ROS   Positive for: Genitourinary, Endocrine, Cardiovascular, Eyes Negative for: Constitutional, Gastrointestinal, Neurological, Skin, Musculoskeletal, HENT, Respiratory, Psychiatric, Allergic/Imm, Heme/Lymph Last edited by Annie Paras, COT on 12/30/2021 12:45 PM.      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   VENTRAL HERNIA REPAIR N/A 12/28/2020   Procedure: LAPAROSCOPIC VENTRAL INCISIONAL HERNIA REPAIR WITH MESH;  Surgeon: Armandina Gemma, MD;  Location: [facility];  Service: General;  Laterality: N/A;   FAMILY HISTORY Family History  Problem Relation Age of Onset   Hypertension Father    Diabetes Brother    Emphysema Mother    SOCIAL HISTORY Social History   Tobacco Use   Smoking status: Former    Packs/day: 0.25    Years: 1.00    Total pack years: 0.25    Types: Cigarettes    Quit date: 06/06/1970    Years since quitting: 51.6   Smokeless tobacco: Never   Vaping Use   Vaping Use: Never used  Substance Use Topics   Alcohol use: Never   Drug use: Never       OPHTHALMIC EXAM: Base Eye Exam     Visual Acuity (Snellen - Linear)       Right Left   Dist cc 20/50 20/20   Dist ph cc 20/40 -2     Correction: Glasses         Tonometry (Tonopen, 12:48 PM)       Right Left   Pressure 10 13         Pupils       Dark Light Shape React APD   Right 2 1 Round Minimal None   Left 2 1 Round Minimal None         Visual Fields       Left Right    Full Full         Extraocular Movement       Right Left    Full, Ortho Full, Ortho         Neuro/Psych     Oriented x3: Yes   Mood/Affect: Normal         Dilation     Both eyes: 1.0% Mydriacyl, 2.5% Phenylephrine @ 12:46 PM           Slit Lamp and Fundus Exam     Slit Lamp Exam       Right Left   Lids/Lashes Dermatochalasis - upper lid, Meibomian gland dysfunction Dermatochalasis - upper lid, Meibomian gland dysfunction   Conjunctiva/Sclera White and quiet White and quiet   Cornea arcus, 1+ fine PEE, trace tear film debris arcus, trace PEE, tear film debris   Anterior Chamber Moderate depth, clear, narrow angles deep, clear, narrow angles   Iris Round and moderately dilated, mild anterior bowing Round and moderately dilated, mild anterior bowing   Lens 2-3+ Nuclear sclerosis with brunescence, 2-3+ Cortical cataract 2-3+ Nuclear sclerosis with brunescence, 2+ Cortical cataract   Anterior Vitreous Vitreous syneresis, Posterior vitreous detachment Vitreous syneresis, Posterior vitreous detachment, vitreous condensations         Fundus Exam       Right Left   Disc Trace pallor, Sharp rim, +cupping Pink and Sharp, +cupping   C/D Ratio 0.7 0.65   Macula Blunted foveal reflex, central edema, RPE mottling, cluster of MA temporal to fovea and temporal macula--improving, mild ERM Flat, Blunted foveal reflex, RPE mottling, focal MA temp mac, exudates improved    Vessels attenuated, Tortuous attenuated, mild tortuosity   Periphery Attached, mild reticular degeneration, mild MA Attached, mild reticular degeneration, scattered MA, cluster of DBH inf to disc  IMAGING AND PROCEDURES  Imaging and Procedures for 12/30/2021  OCT, Retina - OU - Both Eyes       Right Eye Quality was good. Central Foveal Thickness: 460. Progression has improved. Findings include no SRF, abnormal foveal contour, intraretinal hyper-reflective material, intraretinal fluid, vitreomacular adhesion (Persistent prominent central cyst, mild interval improvement in temporal IRF ).   Left Eye Quality was good. Central Foveal Thickness: 257. Progression has been stable. Findings include no SRF, abnormal foveal contour, intraretinal fluid, vitreomacular adhesion (persistent non-central cystic changes temporal macula, persistent central VMT).   Notes *Images captured and stored on drive  Diagnosis / Impression:  OD: +central DME, Persistent prominent central cyst, mild interval improvement in temporal IRF ; +central VMA OS: persistent non-central cystic changes temporal macula, persistent central VMT  Clinical management:  See below  Abbreviations: NFP - Normal foveal profile. CME - cystoid macular edema. PED - pigment epithelial detachment. IRF - intraretinal fluid. SRF - subretinal fluid. EZ - ellipsoid zone. ERM - epiretinal membrane. ORA - outer retinal atrophy. ORT - outer retinal tubulation. SRHM - subretinal hyper-reflective material. IRHM - intraretinal hyper-reflective material      Intravitreal Injection, Pharmacologic Agent - OD - Right Eye       Time Out 12/30/2021. 1:04 PM. Confirmed correct patient, procedure, site, and patient consented.   Anesthesia Topical anesthesia was used. Anesthetic medications included Lidocaine 2%, Proparacaine 0.5%.   Procedure Preparation included 5% betadine to ocular surface, eyelid speculum. A (32g) needle was used.    Injection: 2 mg aflibercept 2 MG/0.05ML   Route: Intravitreal, Site: Right Eye   NDC: [id number], Lot: [id number], Expiration date: 10/04/2022, Waste: 0 mL   Post-op Post injection exam found visual acuity of at least counting fingers. The patient tolerated the procedure well. There were no complications. The patient received written and verbal post procedure care education. Post injection medications were not given.             ASSESSMENT/PLAN:    ICD-10-CM   1. Moderate nonproliferative diabetic retinopathy of both eyes with macular edema associated with type 2 diabetes mellitus (HCC)  E11.3313 OCT, Retina - OU - Both Eyes    Intravitreal Injection, Pharmacologic Agent - OD - Right Eye    aflibercept (EYLEA) SOLN 2 mg    2. Essential hypertension  I10     3. Hypertensive retinopathy of both eyes  H35.033     4. Combined forms of age-related cataract of both eyes  H25.813      1. Moderate non-proliferative diabetic retinopathy, both eyes  - s/p IVE #1 OD (06.22.22)--sample, #2 (09.07.22), #3 (11.14.22), #4 (12.28.22), #5 (02.08.23), IVE #6 (03.15.23), #7 (04.19.23), #8 (05.24.23), #9 (06.28.23) - previously [facility] at [facility] by Dr. Theora Gianotti (Fax [phone]) - history of anti-VEGF injections since 2019 -- last injection IVE OD Feb/Mar 2022 -- q36mointerval - exam w/ scattered MA - OCT shows OD: +central DME, Persistent prominent central cyst, mild interval improvement in temporal IRF; OS: persistent non-central cystic changes temporal macula at 4 weeks - FA (11.14.22) shows OD: Focal hyperflourescence IT to fovea; no NV OS: No NV - BCVA 20/40 OD; 20/20 OS -- stable OU - recommend IVE OD #10 today, 07.27.23 -- f/u again in 4 wks - pt wishes to proceed - RBA of procedure discussed, questions answered - IVE informed consent obtained and re-signed today, 06.28.23 - see procedure note - f/u in 4wks  -- DFE/OCT, possible injection(s)  2,3. Hypertensive  retinopathy  OU - discussed importance of tight BP control - monitor  4. Mixed Cataract OU - The symptoms of cataract, surgical options, and treatments and risks were discussed with patient. - discussed diagnosis and progression - approaching visual significance -- pt reports significant glare symptoms at night - pt wishes to continue to monitor for now  Ophthalmic Meds Ordered this visit:  Meds ordered this encounter  Medications   aflibercept (EYLEA) SOLN 2 mg     Return in about 4 weeks (around 01/27/2022) for NPDR OU, DFE, OCT, possible injection.  There are no Patient Instructions on file for this visit.   This document serves as a record of services personally performed by Gardiner Sleeper, MD, PhD. It was created on their behalf by San Jetty. Owens Shark, OA an ophthalmic technician. The creation of this record is the provider's dictation and/or activities during the visit.    Electronically signed by: San Jetty. Owens Shark, [place] 07.14.2023 1:51 PM  This document serves as a record of services personally performed by Gardiner Sleeper, MD, PhD. It was created on their behalf by Orvan Falconer, an ophthalmic technician. The creation of this record is the provider's dictation and/or activities during the visit.    Electronically signed by: Orvan Falconer, OA, 12/30/21  1:51 PM  Gardiner Sleeper, M.D., Ph.D. Diseases & Surgery of the Retina and Vitreous [facility]  I have reviewed the above documentation for accuracy and completeness, and I agree with the above. Gardiner Sleeper, M.D., Ph.D. 12/30/21 1:53 PM   Abbreviations: M myopia (nearsighted); A astigmatism; H hyperopia (farsighted); P presbyopia; Mrx spectacle prescription;  CTL contact lenses; OD right eye; OS left eye; OU both eyes  XT exotropia; ET esotropia; PEK punctate epithelial keratitis; PEE punctate epithelial erosions; DES dry eye syndrome; MGD meibomian gland dysfunction; ATs artificial tears; PFAT's  preservative free artificial tears; [place] nuclear sclerotic cataract; PSC posterior subcapsular cataract; ERM epi-retinal membrane; PVD posterior vitreous detachment; RD retinal detachment; DM diabetes mellitus; DR diabetic retinopathy; NPDR non-proliferative diabetic retinopathy; PDR proliferative diabetic retinopathy; CSME clinically significant macular edema; DME diabetic macular edema; dbh dot blot hemorrhages; CWS cotton wool spot; POAG primary open angle glaucoma; C/D cup-to-disc ratio; HVF humphrey visual field; GVF goldmann visual field; OCT optical coherence tomography; IOP intraocular pressure; BRVO Branch retinal vein occlusion; CRVO central retinal vein occlusion; CRAO central retinal artery occlusion; BRAO branch retinal artery occlusion; RT retinal tear; SB scleral buckle; PPV pars plana vitrectomy; VH Vitreous hemorrhage; PRP panretinal laser photocoagulation; IVK intravitreal kenalog; VMT vitreomacular traction; MH Macular hole;  NVD neovascularization of the disc; NVE neovascularization elsewhere; AREDS age related eye disease study; ARMD age related macular degeneration; POAG primary open angle glaucoma; EBMD epithelial/anterior basement membrane dystrophy; ACIOL anterior chamber intraocular lens; IOL intraocular lens; PCIOL posterior chamber intraocular lens; Phaco/IOL phacoemulsification with intraocular lens placement; [place] photorefractive keratectomy; LASIK laser assisted in situ keratomileusis; HTN hypertension; DM diabetes mellitus; COPD chronic obstructive pulmonary disease

## 2021-12-29 ENCOUNTER — Encounter (INDEPENDENT_AMBULATORY_CARE_PROVIDER_SITE_OTHER): Payer: Medicare HMO | Admitting: Ophthalmology

## 2021-12-30 ENCOUNTER — Ambulatory Visit (INDEPENDENT_AMBULATORY_CARE_PROVIDER_SITE_OTHER): Payer: Medicare HMO | Admitting: Ophthalmology

## 2021-12-30 ENCOUNTER — Encounter (INDEPENDENT_AMBULATORY_CARE_PROVIDER_SITE_OTHER): Payer: Self-pay | Admitting: Ophthalmology

## 2021-12-30 ENCOUNTER — Encounter (INDEPENDENT_AMBULATORY_CARE_PROVIDER_SITE_OTHER): Payer: Medicare HMO | Admitting: Ophthalmology

## 2021-12-30 DIAGNOSIS — H35033 Hypertensive retinopathy, bilateral: Secondary | ICD-10-CM | POA: Diagnosis not present

## 2021-12-30 DIAGNOSIS — I10 Essential (primary) hypertension: Secondary | ICD-10-CM | POA: Diagnosis not present

## 2021-12-30 DIAGNOSIS — H25813 Combined forms of age-related cataract, bilateral: Secondary | ICD-10-CM

## 2021-12-30 DIAGNOSIS — E113313 Type 2 diabetes mellitus with moderate nonproliferative diabetic retinopathy with macular edema, bilateral: Secondary | ICD-10-CM | POA: Diagnosis not present

## 2021-12-30 MED ORDER — AFLIBERCEPT 2MG/0.05ML IZ SOLN FOR KALEIDOSCOPE
2.0000 mg | INTRAVITREAL | Status: AC | PRN
  Administered 2021-12-30: 2 mg via INTRAVITREAL

## 2022-01-01 ENCOUNTER — Other Ambulatory Visit: Payer: Self-pay | Admitting: Family Medicine

## 2022-01-01 DIAGNOSIS — E1152 Type 2 diabetes mellitus with diabetic peripheral angiopathy with gangrene: Secondary | ICD-10-CM

## 2022-01-13 NOTE — Progress Notes (Signed)
[facility] Note  01/27/2022     CHIEF COMPLAINT Patient presents for Retina Follow Up  HISTORY OF PRESENT ILLNESS: Erin Mitchell is a 76 y.o. female who presents to the clinic today for:   HPI     Retina Follow Up   Patient presents with  Diabetic Retinopathy.  In both eyes.  Severity is moderate.  Duration of 4 weeks.  Since onset it is stable.  I, the attending physician,  performed the HPI with the patient and updated documentation appropriately.        Comments   Pt states vision is stable, using AT's as needed, floaters are still present, blood sugar was 139 this am, losartan was increased to 133m from 546m     Last edited by ZaBernarda CaffeyMD on 01/27/2022 11:39 PM.    Pt states   Referring physician: MaLuetta NuttingDO [id number] [place] [id number] INFORMATION:   Selected notes from the MEDICAL RECORD NUMBER Referred by Dr. CoLuetta Nuttingor DM exam LEE:  Ocular Hx- PMH- DM, HTN, afib, kidney disease, last a1c was 6.8 on 05.05.22    CURRENT MEDICATIONS: No current outpatient medications on file. (Ophthalmic Drugs)   No current facility-administered medications for this visit. (Ophthalmic Drugs)   Current Outpatient Medications (Other)  Medication Sig   Alcohol Swabs (DROPSAFE ALCOHOL PREP) 70 % PADS USE AS DIRECTED.   Blood Glucose Monitoring Suppl (TRUE METRIX METER) w/Device KIT Use as directed to test blood glucose daily   Cholecalciferol (VITAMIN D-1000 MAX ST PO) Take 2,000 Units by mouth daily.   clopidogrel (PLAVIX) 75 MG tablet TAKE 1 TABLET EVERY DAY   doxycycline (VIBRA-TABS) 100 MG tablet Take 1 tablet (100 mg total) by mouth 2 (two) times daily.   glucose blood (TRUE METRIX BLOOD GLUCOSE TEST) test strip USE AS INSTRUCTED   loperamide (IMODIUM A-D) 2 MG tablet Take 2-4 mg by mouth 4 (four) times daily as needed for diarrhea or loose stools.   losartan (COZAAR)  100 MG tablet Take 1 tablet (100 mg total) by mouth daily.   Melatonin 10 MG CAPS Take by mouth.   metFORMIN (GLUCOPHAGE) 500 MG tablet TAKE 2 TABLETS TWICE DAILY WITH MEALS   metoprolol tartrate (LOPRESSOR) 25 MG tablet TAKE 1 TABLET TWICE DAILY WITH FOOD   NP THYROID 90 MG tablet TAKE 1 TABLET (90 MG TOTAL) BY MOUTH DAILY BEFORE BREAKFAST.   PAPAYA ENZYME PO Take 2 capsules by mouth daily as needed (heartburn).   Probiotic Product (ALIGN) 4 MG CAPS Take 4 mg by mouth 2 (two) times daily.   rosuvastatin (CRESTOR) 40 MG tablet TAKE 1 TABLET EVERY DAY   TRUEplus Lancets 33G MISC Use as directed daily.   Zinc Sulfate (ZINC 15 PO) Take by mouth.   No current facility-administered medications for this visit. (Other)   REVIEW OF SYSTEMS: ROS   Positive for: Endocrine, Cardiovascular, Eyes Negative for: Constitutional, Gastrointestinal, Neurological, Skin, Genitourinary, Musculoskeletal, HENT, Respiratory, Psychiatric, Allergic/Imm, Heme/Lymph Last edited by BrDebbrah AlarCOT on 01/27/2022  8:30 AM.     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   VENTRAL HERNIA REPAIR N/A 12/28/2020   Procedure: LAPAROSCOPIC VENTRAL INCISIONAL HERNIA REPAIR WITH MESH;  Surgeon: Armandina Gemma, MD;  Location: [facility];  Service: General;  Laterality: N/A;   FAMILY HISTORY Family History  Problem Relation Age of Onset   Hypertension Father    Diabetes Brother    Emphysema Mother    SOCIAL HISTORY Social History   Tobacco Use   Smoking status: Former     Packs/day: 0.25    Years: 1.00    Total pack years: 0.25    Types: Cigarettes    Quit date: 06/06/1970    Years since quitting: 51.6   Smokeless tobacco: Never  Vaping Use   Vaping Use: Never used  Substance Use Topics   Alcohol use: Never   Drug use: Never       OPHTHALMIC EXAM: Base Eye Exam     Visual Acuity (Snellen - Linear)       Right Left   Dist cc 20/70 +2 20/25 +2   Dist ph cc 20/50 -1 20/20 -1    Correction: Glasses         Tonometry (Tonopen, 8:36 AM)       Right Left   Pressure 12 13         Pupils       Dark Light Shape React APD   Right 2 1 Round Minimal None   Left 2 1 Round Minimal None         Neuro/Psych     Oriented x3: Yes   Mood/Affect: Normal         Dilation     Both eyes: 1.0% Mydriacyl, 2.5% Phenylephrine @ 8:37 AM           Slit Lamp and Fundus Exam     Slit Lamp Exam       Right Left   Lids/Lashes Dermatochalasis - upper lid, Meibomian gland dysfunction Dermatochalasis - upper lid, Meibomian gland dysfunction   Conjunctiva/Sclera White and quiet White and quiet   Cornea arcus, 1+ fine PEE, trace tear film debris arcus, trace PEE, tear film debris   Anterior Chamber Moderate depth, clear, narrow angles deep, clear, narrow angles   Iris Round and moderately dilated, mild anterior bowing Round and moderately dilated, mild anterior bowing   Lens 2-3+ Nuclear sclerosis with brunescence, 2-3+ Cortical cataract 2-3+ Nuclear sclerosis with brunescence, 2+ Cortical cataract   Anterior Vitreous Vitreous syneresis, Posterior vitreous detachment Vitreous syneresis, Posterior vitreous detachment, vitreous condensations         Fundus Exam       Right Left   Disc Trace pallor, Sharp rim, +cupping Pink and Sharp, +cupping   C/D Ratio 0.7 0.65   Macula Blunted foveal reflex, central edema, RPE mottling, cluster of MA temporal to fovea and temporal macula -- improving, mild ERM Flat, Blunted foveal reflex, RPE mottling,  focal MA temp mac, exudates improved   Vessels attenuated, Tortuous attenuated, Tortuous   Periphery Attached, mild reticular degeneration, mild MA Attached, mild reticular degeneration, scattered MA, cluster of DBH inf to disc           IMAGING AND PROCEDURES  Imaging and Procedures for  01/27/2022  OCT, Retina - OU - Both Eyes       Right Eye Quality was good. Central Foveal Thickness: 446. Progression has improved. Findings include no SRF, abnormal foveal contour, intraretinal hyper-reflective material, intraretinal fluid, vitreomacular adhesion (Persistent prominent central cyst -- slightly improved, mild interval improvement in central IRF ).   Left Eye Quality was good. Central Foveal Thickness: 261. Progression has been stable. Findings include no SRF, abnormal foveal contour, intraretinal fluid, vitreomacular adhesion (persistent non-central cystic changes temporal macula, persistent central VMT).   Notes *Images captured and stored on drive  Diagnosis / Impression:  OD: +central DME, Persistent prominent central cyst -- slightly improved, mild interval improvement in central IRF  OS: persistent non-central cystic changes temporal macula, persistent central VMT  Clinical management:  See below  Abbreviations: NFP - Normal foveal profile. CME - cystoid macular edema. PED - pigment epithelial detachment. IRF - intraretinal fluid. SRF - subretinal fluid. EZ - ellipsoid zone. ERM - epiretinal membrane. ORA - outer retinal atrophy. ORT - outer retinal tubulation. SRHM - subretinal hyper-reflective material. IRHM - intraretinal hyper-reflective material      Intravitreal Injection, Pharmacologic Agent - OD - Right Eye       Time Out 01/27/2022. 8:51 AM. Confirmed correct patient, procedure, site, and patient consented.   Anesthesia Topical anesthesia was used. Anesthetic medications included Lidocaine 2%, Proparacaine 0.5%.   Procedure Preparation included 5% betadine to  ocular surface, eyelid speculum. A (32g) needle was used.   Injection: 2 mg aflibercept 2 MG/0.05ML   Route: Intravitreal, Site: Right Eye   NDC: [id number], Lot: [id number], Expiration date: 10/04/2022, Waste: 0 mL   Post-op Post injection exam found visual acuity of at least counting fingers. The patient tolerated the procedure well. There were no complications. The patient received written and verbal post procedure care education. Post injection medications were not given.              ASSESSMENT/PLAN:    ICD-10-CM   1. Moderate nonproliferative diabetic retinopathy of both eyes with macular edema associated with type 2 diabetes mellitus (HCC)  E11.3313 OCT, Retina - OU - Both Eyes    Intravitreal Injection, Pharmacologic Agent - OD - Right Eye    aflibercept (EYLEA) SOLN 2 mg    CANCELED: Intravitreal Injection, Pharmacologic Agent - OD - Right Eye    2. Vitreomacular adhesion of right eye  H43.821     3. Essential hypertension  I10     4. Hypertensive retinopathy of both eyes  H35.033     5. Combined forms of age-related cataract of both eyes  H25.813      1. Moderate non-proliferative diabetic retinopathy, both eyes  - s/p IVE #1 OD (06.22.22)--sample, #2 (09.07.22), #3 (11.14.22), #4 (12.28.22), #5 (02.08.23), IVE #6 (03.15.23), #7 (04.19.23), #8 (05.24.23), #9 (06.28.23), #10 (07.27.23) - previously seen at [facility] by Dr. Theora Gianotti (Fax [phone]) - history of anti-VEGF injections since 2019 -- last injection IVE OD Feb/Mar 2022 -- q68mointerval - exam w/ scattered MA - OCT shows OD: Persistent prominent central cyst -- slightly improved, mild interval improvement in central IRF; OS: persistent non-central cystic changes temporal macula at 4 weeks - FA (11.14.22) shows OD: Focal hyperflourescence IT to fovea; no NV OS: No NV - BCVA 20/50 OD -- decreased; 20/20 OS -- stable - recommend IVE OD #11 today, 08.24.23 -- f/u again in 4 wks - discussed  possible resistance to Eylea -- will work on authorization for  Vabysmo for next appt - pt wishes to proceed - RBA of procedure discussed, questions answered - IVE informed consent obtained and re-signed today, 06.28.23 - see procedure note - f/u in 4wks  -- DFE/OCT, possible injection(s)  2. VMA/VMT OD  - central VMA/VMT OD may be contributing to central cyst  - monitor  3,4. Hypertensive retinopathy OU - discussed importance of tight BP control - monitor  5. Mixed Cataract OU - The symptoms of cataract, surgical options, and treatments and risks were discussed with patient. - discussed diagnosis and progression - approaching visual significance -- pt reports significant glare symptoms at night - pt wishes to continue to monitor for now  Ophthalmic Meds Ordered this visit:  Meds ordered this encounter  Medications   aflibercept (EYLEA) SOLN 2 mg     Return in about 4 weeks (around 02/24/2022) for f/u NPDR OU, DFE, OCT.  There are no Patient Instructions on file for this visit.   This document serves as a record of services personally performed by Gardiner Sleeper, MD, PhD. It was created on their behalf by San Jetty. Owens Shark, OA an ophthalmic technician. The creation of this record is the provider's dictation and/or activities during the visit.    Electronically signed by: San Jetty. Owens Shark, [place] 08.10.2023 11:42 PM  Gardiner Sleeper, M.D., Ph.D. Diseases & Surgery of the Retina and Vitreous [facility]  I have reviewed the above documentation for accuracy and completeness, and I agree with the above. Gardiner Sleeper, M.D., Ph.D. 01/27/22 11:42 PM  Abbreviations: M myopia (nearsighted); A astigmatism; H hyperopia (farsighted); P presbyopia; Mrx spectacle prescription;  CTL contact lenses; OD right eye; OS left eye; OU both eyes  XT exotropia; ET esotropia; PEK punctate epithelial keratitis; PEE punctate epithelial erosions; DES dry eye syndrome; MGD meibomian  gland dysfunction; ATs artificial tears; PFAT's preservative free artificial tears; Milo nuclear sclerotic cataract; PSC posterior subcapsular cataract; ERM epi-retinal membrane; PVD posterior vitreous detachment; RD retinal detachment; DM diabetes mellitus; DR diabetic retinopathy; NPDR non-proliferative diabetic retinopathy; PDR proliferative diabetic retinopathy; CSME clinically significant macular edema; DME diabetic macular edema; dbh dot blot hemorrhages; CWS cotton wool spot; POAG primary open angle glaucoma; C/D cup-to-disc ratio; HVF humphrey visual field; GVF goldmann visual field; OCT optical coherence tomography; IOP intraocular pressure; BRVO Branch retinal vein occlusion; CRVO central retinal vein occlusion; CRAO central retinal artery occlusion; BRAO branch retinal artery occlusion; RT retinal tear; SB scleral buckle; PPV pars plana vitrectomy; VH Vitreous hemorrhage; PRP panretinal laser photocoagulation; IVK intravitreal kenalog; VMT vitreomacular traction; MH Macular hole;  NVD neovascularization of the disc; NVE neovascularization elsewhere; AREDS age related eye disease study; ARMD age related macular degeneration; POAG primary open angle glaucoma; EBMD epithelial/anterior basement membrane dystrophy; ACIOL anterior chamber intraocular lens; IOL intraocular lens; PCIOL posterior chamber intraocular lens; Phaco/IOL phacoemulsification with intraocular lens placement; [place] photorefractive keratectomy; LASIK laser assisted in situ keratomileusis; HTN hypertension; DM diabetes mellitus; COPD chronic obstructive pulmonary disease

## 2022-01-22 ENCOUNTER — Other Ambulatory Visit: Payer: Self-pay | Admitting: Family Medicine

## 2022-01-25 ENCOUNTER — Ambulatory Visit: Payer: Medicare HMO | Admitting: Family Medicine

## 2022-01-25 ENCOUNTER — Telehealth: Payer: Self-pay | Admitting: Family Medicine

## 2022-01-25 ENCOUNTER — Encounter: Payer: Self-pay | Admitting: Family Medicine

## 2022-01-25 VITALS — BP 154/67 | HR 81 | Wt 166.0 lb

## 2022-01-25 DIAGNOSIS — W5503XA Scratched by cat, initial encounter: Secondary | ICD-10-CM | POA: Diagnosis not present

## 2022-01-25 DIAGNOSIS — I10 Essential (primary) hypertension: Secondary | ICD-10-CM

## 2022-01-25 MED ORDER — LOSARTAN POTASSIUM 100 MG PO TABS: 100.0000 mg | ORAL_TABLET | Freq: Every day | ORAL | 1 refills | Status: DC

## 2022-01-25 MED ORDER — DOXYCYCLINE HYCLATE 100 MG PO TABS: 100.0000 mg | ORAL_TABLET | Freq: Two times a day (BID) | ORAL | 0 refills | Status: DC

## 2022-01-25 NOTE — Assessment & Plan Note (Signed)
BP not well controlled tod or at last OV> Will inc losartan to 100mg . Renal function is good. F/U with PCP in 2-3 weeks.

## 2022-01-25 NOTE — Telephone Encounter (Signed)
Call pt: repat BP was high. Will inc losaratan dose to 100mg . New rx sent to pharmacy. F/U with nurse visit in 3 weeks for [facility]

## 2022-01-25 NOTE — Progress Notes (Signed)
Acute Office Visit  Subjective:     Patient ID: Erin Mitchell, female    DOB: August 29, 1945, 76 y.o.   MRN: [id number]  Chief Complaint  Patient presents with   Abrasion    Cat scratch on right hand 5 th finger yesterday. Red and swollen today.     HPI Patient is in today for Cat scratch on right hand 5 th finger yesterday. Red and swollen today.  She was volunteering at the shelter.    It was not a cat bite.  She says it feels sore but she is able to move her finger normally.  She was concerned because she has had an infection in some toes back in 2021 and had to have them amputated so she wanted to be on the safe side.  Tdap is up to date.      ROS      Objective:    BP (!) 154/67   Pulse 81   Wt 166 lb (75.3 kg)   SpO2 97%   BMI 28.49 kg/m    Physical Exam Vitals reviewed.  Constitutional:      Appearance: She is well-developed.  HENT:     Head: Normocephalic and atraumatic.  Eyes:     Conjunctiva/sclera: Conjunctivae normal.  Cardiovascular:     Rate and Rhythm: Normal rate.  Pulmonary:     Effort: Pulmonary effort is normal.  Skin:    General: Skin is dry.     Coloration: Skin is not pale.  Neurological:     Mental Status: She is alert and oriented to person, place, and time.  Psychiatric:        Behavior: Behavior normal.      No results found for any visits on 01/25/22.      Assessment & Plan:   Problem List Items Addressed This Visit       Cardiovascular and Mediastinum   Hypertension    BP not well controlled tod or at last OV> Will inc losartan to 100mg . Renal function is good. F/U with PCP in 2-3 weeks.        Relevant Medications   losartan (COZAAR) 100 MG tablet   Other Visit Diagnoses     Cat scratch    -  Primary      Cat scratch on her fifth digit on her right hand.  She has a V-shaped cut but she does have some erythema going just above the knuckle.  She has normal range of motion.  We discussed treating with  doxycycline to cover for staph.  She her tetanus is up-to-date.  If she is not improving over the next 48 hours then please let know.  Wash the wound with regular soap and water and pat dry.  We also discussed applying Vaseline to the wound okay to use Neosporin for couple days but after that she will need to switch.  If she starts to notice any worsening drainage or foul odor then please let us know immediately.   Meds ordered this encounter  Medications   DISCONTD: doxycycline (VIBRA-TABS) 100 MG tablet    Sig: Take 1 tablet (100 mg total) by mouth 2 (two) times daily.    Dispense:  14 tablet    Refill:  0   doxycycline (VIBRA-TABS) 100 MG tablet    Sig: Take 1 tablet (100 mg total) by mouth 2 (two) times daily.    Dispense:  14 tablet    Refill:  0   losartan (  COZAAR) 100 MG tablet    Sig: Take 1 tablet (100 mg total) by mouth daily.    Dispense:  90 tablet    Refill:  1    No follow-ups on file.  Nani Gasser, MD

## 2022-01-26 ENCOUNTER — Other Ambulatory Visit: Payer: Self-pay

## 2022-01-26 DIAGNOSIS — I10 Essential (primary) hypertension: Secondary | ICD-10-CM

## 2022-01-26 MED ORDER — LOSARTAN POTASSIUM 100 MG PO TABS: 100.0000 mg | ORAL_TABLET | Freq: Every day | ORAL | 1 refills | Status: DC

## 2022-01-26 NOTE — Telephone Encounter (Signed)
Task completed. Per family member, requested that losartan rx be sent to [facility] pharmacy. She will return a call back to schedule a NV appointment for patient.

## 2022-01-27 ENCOUNTER — Ambulatory Visit (INDEPENDENT_AMBULATORY_CARE_PROVIDER_SITE_OTHER): Payer: Medicare HMO | Admitting: Ophthalmology

## 2022-01-27 ENCOUNTER — Ambulatory Visit: Payer: Medicare HMO | Admitting: Podiatry

## 2022-01-27 ENCOUNTER — Encounter (INDEPENDENT_AMBULATORY_CARE_PROVIDER_SITE_OTHER): Payer: Self-pay | Admitting: Ophthalmology

## 2022-01-27 DIAGNOSIS — E113313 Type 2 diabetes mellitus with moderate nonproliferative diabetic retinopathy with macular edema, bilateral: Secondary | ICD-10-CM | POA: Diagnosis not present

## 2022-01-27 DIAGNOSIS — H35033 Hypertensive retinopathy, bilateral: Secondary | ICD-10-CM

## 2022-01-27 DIAGNOSIS — I10 Essential (primary) hypertension: Secondary | ICD-10-CM

## 2022-01-27 DIAGNOSIS — H25813 Combined forms of age-related cataract, bilateral: Secondary | ICD-10-CM | POA: Diagnosis not present

## 2022-01-27 DIAGNOSIS — H43821 Vitreomacular adhesion, right eye: Secondary | ICD-10-CM | POA: Diagnosis not present

## 2022-01-27 MED ORDER — AFLIBERCEPT 2MG/0.05ML IZ SOLN FOR KALEIDOSCOPE
2.0000 mg | INTRAVITREAL | Status: AC | PRN
  Administered 2022-01-27: 2 mg via INTRAVITREAL

## 2022-01-28 ENCOUNTER — Ambulatory Visit: Payer: Medicare HMO | Admitting: Podiatry

## 2022-01-28 ENCOUNTER — Encounter: Payer: Self-pay | Admitting: Podiatry

## 2022-01-28 DIAGNOSIS — B351 Tinea unguium: Secondary | ICD-10-CM | POA: Diagnosis not present

## 2022-01-28 DIAGNOSIS — M79674 Pain in right toe(s): Secondary | ICD-10-CM | POA: Diagnosis not present

## 2022-01-28 DIAGNOSIS — M79675 Pain in left toe(s): Secondary | ICD-10-CM | POA: Diagnosis not present

## 2022-01-28 DIAGNOSIS — Z89429 Acquired absence of other toe(s), unspecified side: Secondary | ICD-10-CM

## 2022-01-28 DIAGNOSIS — I739 Peripheral vascular disease, unspecified: Secondary | ICD-10-CM | POA: Diagnosis not present

## 2022-01-28 DIAGNOSIS — E1152 Type 2 diabetes mellitus with diabetic peripheral angiopathy with gangrene: Secondary | ICD-10-CM

## 2022-01-28 NOTE — Progress Notes (Signed)
  Subjective:  Patient ID: Erin Mitchell, female    DOB: August 03, 1945,   MRN: [id number]  Chief Complaint  Patient presents with   Nail Problem     Routine foot care    76 y.o. female presents for painful elongated and discolored nails that are difficult to trim.  Relates she is unable to trim herself. She is diabetic and last A1c was 6.7. 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 Also has a history of PAD . Denies any other pedal complaints. Denies n/v/f/c.   PCP: Everrett Coombe MD   Past Medical History:  Diagnosis Date   Atrial fibrillation (HCC)    on Plavix   Cataract    Diabetes (HCC)    Diabetic retinopathy (HCC)    Exposure to Agent Orange    Hypertension    Hypertensive retinopathy    hypothyroidism    Hypothyroidism    Kidney disease     Objective:  Physical Exam: Vascular: DP/PT pulses 2/4 bilateral. CFT <3 seconds. Absent hair growth on digits. No edema.  Skin. No lacerations or abrasions bilateral feet. Nails 1-5 are thickened discolored and elongated with subungual debris.  Musculoskeletal: MMT 5/5 bilateral lower extremities in DF, PF, Inversion and Eversion. Deceased ROM in DF of ankle joint. Right second and third digits amputated. Severe bunion of right foot.  Neurological: Sensation intact to light touch.   Assessment:   1. Pain in toes of both feet   2. Dermatophytosis of nail   3. Type 2 diabetes mellitus with diabetic peripheral angiopathy and gangrene, without long-term current use of insulin (HCC)   4. History of amputation of toe (HCC)   5. PAD (peripheral artery disease) (HCC)         Plan:  Patient was evaluated and treated and all questions answered. -Discussed and educated patient on diabetic foot care, especially with  regards to the vascular, neurological and musculoskeletal systems.  -Stressed the importance of good glycemic control and the detriment of not  controlling glucose levels in  relation to the foot. -Discussed supportive shoes at all times and checking feet regularly.  -Mechanically debrided all nails 1-5 bilateral using sterile nail nipper and filed with dremel without incident  -Answered all patient questions -Patient to return  in 3 months for at risk foot care -Patient advised to call the office if any problems or questions arise in the meantime.   Louann Sjogren, DPM

## 2022-02-18 NOTE — Progress Notes (Signed)
[facility] Note  02/24/2022     CHIEF COMPLAINT Patient presents for Retina Follow Up  HISTORY OF PRESENT ILLNESS: Erin Mitchell is a 76 y.o. female who presents to the clinic today for:   HPI     Retina Follow Up   Patient presents with  Diabetic Retinopathy.  In both eyes.  Severity is moderate.  Duration of 4 weeks.  Since onset it is stable.  I, the attending physician,  performed the HPI with the patient and updated documentation appropriately.        Comments   Pt here for 4 wk ret f/u NPDR OU. Pt states VA is the same. Pt states in OD she has a tipi shaped distortion in OD and over the last couple of weeks noticed the tip of the tipi is flatter.       Last edited by Bernarda Caffey, MD on 02/24/2022 10:37 AM.     Pt states   Referring physician: Luetta Nutting, DO [address]  [address] [place],  [place] [address]  HISTORICAL INFORMATION:   Selected notes from the MEDICAL RECORD NUMBER Referred by Dr. Luetta Nutting for DM exam LEE:  Ocular Hx- PMH- DM, HTN, afib, kidney disease, last a1c was 6.8 on 05.05.22    CURRENT MEDICATIONS: No current outpatient medications on file. (Ophthalmic Drugs)   No current facility-administered medications for this visit. (Ophthalmic Drugs)   Current Outpatient Medications (Other)  Medication Sig   Alcohol Swabs (DROPSAFE ALCOHOL PREP) 70 % PADS USE AS DIRECTED.   Blood Glucose Monitoring Suppl (TRUE METRIX METER) w/Device KIT Use as directed to test blood glucose daily   Cholecalciferol (VITAMIN D-1000 MAX ST PO) Take 2,000 Units by mouth daily.   clopidogrel (PLAVIX) 75 MG tablet TAKE 1 TABLET EVERY DAY   doxycycline (VIBRA-TABS) 100 MG tablet Take 1 tablet (100 mg total) by mouth 2 (two) times daily.   glucose blood (TRUE METRIX BLOOD GLUCOSE TEST) test strip USE AS INSTRUCTED   loperamide (IMODIUM A-D) 2 MG tablet Take 2-4 mg by mouth 4 (four) times daily as needed for  diarrhea or loose stools.   losartan (COZAAR) 100 MG tablet Take 1 tablet (100 mg total) by mouth daily.   Melatonin 10 MG CAPS Take by mouth.   metFORMIN (GLUCOPHAGE) 500 MG tablet TAKE 2 TABLETS TWICE DAILY WITH MEALS   metoprolol tartrate (LOPRESSOR) 25 MG tablet TAKE 1 TABLET TWICE DAILY WITH FOOD   NP THYROID 90 MG tablet Take 1 tablet (90 mg total) by mouth daily. NO REFILLS. NEEDS LABS.   PAPAYA ENZYME PO Take 2 capsules by mouth daily as needed (heartburn).   Probiotic Product (ALIGN) 4 MG CAPS Take 4 mg by mouth 2 (two) times daily.   rosuvastatin (CRESTOR) 40 MG tablet TAKE 1 TABLET EVERY DAY   TRUEplus Lancets 33G MISC Use as directed daily.   Zinc Sulfate (ZINC 15 PO) Take by mouth.   No current facility-administered medications for this visit. (Other)   REVIEW OF SYSTEMS: ROS   Positive for: Endocrine, Cardiovascular, Eyes Negative for: Constitutional, Gastrointestinal, Neurological, Skin, Genitourinary, Musculoskeletal, HENT, Respiratory, Psychiatric, Allergic/Imm, Heme/Lymph Last edited by Kingsley Spittle, COT on 02/24/2022  8:18 AM.     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   VENTRAL HERNIA REPAIR N/A 12/28/2020   Procedure: LAPAROSCOPIC VENTRAL INCISIONAL HERNIA REPAIR WITH MESH;  Surgeon: Armandina Gemma, MD;  Location: [facility];  Service: General;  Laterality: N/A;   FAMILY HISTORY Family History  Problem Relation Age of Onset   Hypertension Father    Diabetes Brother    Emphysema Mother    SOCIAL  HISTORY Social History   Tobacco Use   Smoking status: Former    Packs/day: 0.25    Years: 1.00    Total pack years: 0.25    Types: Cigarettes    Quit date: 06/06/1970    Years since quitting: 51.7   Smokeless tobacco: Never  Vaping Use   Vaping Use: Never used  Substance Use Topics   Alcohol use: Never   Drug use: Never       OPHTHALMIC EXAM: Base Eye Exam     Visual Acuity (Snellen - Linear)       Right Left   Dist cc 20/60 20/20   Dist ph cc 20/40 -2     Correction: Glasses         Tonometry (Tonopen, 8:25 AM)       Right Left   Pressure 8 9         Pupils       Dark Light Shape React APD   Right 2 1 Round Minimal None   Left 2 1 Round Minimal None         Visual Fields (Counting fingers)       Left Right    Full Full         Extraocular Movement       Right Left    Full, Ortho Full, Ortho         Neuro/Psych     Oriented x3: Yes   Mood/Affect: Normal         Dilation     Both eyes: 1.0% Mydriacyl, 2.5% Phenylephrine @ 8:26 AM           Slit Lamp and Fundus Exam     Slit Lamp Exam       Right Left   Lids/Lashes Dermatochalasis - upper lid, Meibomian gland dysfunction Dermatochalasis - upper lid, Meibomian gland dysfunction   Conjunctiva/Sclera White and quiet White and quiet   Cornea arcus, 1+ fine PEE, trace tear film debris arcus, trace PEE, tear film debris   Anterior Chamber Moderate depth, clear, narrow angles deep, clear, narrow angles   Iris Round and moderately dilated, mild anterior bowing Round and moderately dilated, mild anterior bowing   Lens 2-3+ Nuclear sclerosis with brunescence, 2-3+ Cortical cataract 2-3+ Nuclear sclerosis with brunescence, 2+ Cortical cataract   Anterior Vitreous Vitreous syneresis, Posterior vitreous detachment Vitreous syneresis, Posterior vitreous detachment, vitreous condensations         Fundus Exam       Right Left   Disc Trace pallor, Sharp rim, +cupping Pink and Sharp,  +cupping   C/D Ratio 0.7 0.65   Macula Blunted foveal reflex, central edema, RPE mottling, cluster of MA temporal  to fovea and temporal macula -- improving, mild ERM Flat, Blunted foveal reflex, RPE mottling, focal MA temp mac, exudates improved   Vessels attenuated, Tortuous attenuated, Tortuous   Periphery Attached, mild reticular degeneration, mild MA Attached, mild reticular degeneration, scattered MA, cluster of DBH inf to disc           Refraction     Wearing Rx       Sphere Cylinder Add   Right +1.25 Sphere +2.75   Left +1.50 Sphere +2.75    Type: Bifocal           IMAGING AND PROCEDURES  Imaging and Procedures for 02/24/2022  OCT, Retina - OU - Both Eyes       Right Eye Quality was good. Central Foveal Thickness: 454. Progression has been stable. Findings include no SRF, abnormal foveal contour, intraretinal hyper-reflective material, intraretinal fluid, vitreomacular adhesion (Persistent prominent central cyst and surrounding cystic changes).   Left Eye Quality was good. Central Foveal Thickness: 262. Progression has been stable. Findings include no SRF, abnormal foveal contour, intraretinal fluid, vitreomacular adhesion (persistent non-central cystic changes temporal macula, persistent central VMT).   Notes *Images captured and stored on drive  Diagnosis / Impression:  OD: +central DME, Persistent prominent central cyst and surrounding cystic changes OS: persistent non-central cystic changes temporal macula, persistent central VMT  Clinical management:  See below  Abbreviations: NFP - Normal foveal profile. CME - cystoid macular edema. PED - pigment epithelial detachment. IRF - intraretinal fluid. SRF - subretinal fluid. EZ - ellipsoid zone. ERM - epiretinal membrane. ORA - outer retinal atrophy. ORT - outer retinal tubulation. SRHM - subretinal hyper-reflective material. IRHM - intraretinal hyper-reflective material      Intravitreal Injection,  Pharmacologic Agent - OD - Right Eye       Time Out 02/24/2022. 9:13 AM. Confirmed correct patient, procedure, site, and patient consented.   Anesthesia Topical anesthesia was used. Anesthetic medications included Lidocaine 2%, Proparacaine 0.5%.   Procedure Preparation included 5% betadine to ocular surface, eyelid speculum. A (32g) needle was used.   Injection: 6 mg faricimab-svoa 6 MG/0.05ML   Route: Intravitreal, Site: Right Eye   NDC: [id number], Lot: [id number], Expiration date: 01/04/2024, Waste: 0 mL   Post-op Post injection exam found visual acuity of at least counting fingers. The patient tolerated the procedure well. There were no complications. The patient received written and verbal post procedure care education. Post injection medications were not given.            ASSESSMENT/PLAN:    ICD-10-CM   1. Moderate nonproliferative diabetic retinopathy of both eyes with macular edema associated with type 2 diabetes mellitus (HCC)  E11.3313 OCT, Retina - OU - Both Eyes    Intravitreal Injection, Pharmacologic Agent - OD - Right Eye    faricimab-svoa (VABYSMO) 3m/0.05mL intravitreal injection    2. Vitreomacular adhesion of right eye  H43.821     3. Essential hypertension  I10     4. Hypertensive retinopathy of both eyes  H35.033     5. Combined forms of age-related cataract of both eyes  H25.813      1. Moderate non-proliferative diabetic retinopathy, both eyes  - s/p IVE #1 OD (06.22.22)--sample, #2 (09.07.22), #3 (11.14.22), #4 (12.28.22), #5 (02.08.23), IVE #6 (03.15.23), #7 (04.19.23), #8 (05.24.23), #9 (06.28.23), #10 (07.27.23), #11 (08.24.23) - [facility](Fax ([phone] - history of anti-VEGF injections since 2019 -- last injection IVE OD Feb/Mar 2022 --  q54mointerval - exam w/ scattered MA - OCT shows OD: Persistent prominent central cyst -- slightly improved, mild interval improvement in central IRF; OS:  persistent non-central cystic changes temporal macula at 4 weeks - FA (11.14.22) shows OD: Focal hyperflourescence IT to fovea; no NV OS: No NV - BCVA improved to 20/40 OD; 20/20 OS -- stable. - recommend switching to Vabysmo today due to IVE resistance - recommend IVV OD #1 today, 09.21.23 -- f/u again in 4 wks - pt wishes to proceed - RBA of procedure discussed, questions answered - IVE informed consent obtained and re-signed today, 06.28.23 - IVV informed consent obtained and signed, 09.21.23 - see procedure note - f/u in 4wks  -- DFE/OCT, possible injection(s)  2. VMA/VMT OD  - central VMA/VMT OD may be contributing to central cyst  - monitor  3,4. Hypertensive retinopathy OU - discussed importance of tight BP control - monitor  5. Mixed Cataract OU - The symptoms of cataract, surgical options, and treatments and risks were discussed with patient. - discussed diagnosis and progression - approaching visual significance -- pt reports significant glare symptoms at night - pt wishes to continue to monitor for now  Ophthalmic Meds Ordered this visit:  Meds ordered this encounter  Medications   faricimab-svoa (VABYSMO) 675m0.05mL intravitreal injection     Return in about 4 weeks (around 03/24/2022) for f/u NPDR OU, DFE, OCT.  There are no Patient Instructions on file for this visit.   This document serves as a record of services personally performed by BrGardiner SleeperMD, PhD. It was created on their behalf by AmSan JettyBrOwens SharkOA an ophthalmic technician. The creation of this record is the provider's dictation and/or activities during the visit.    Electronically signed by: AmSan JettyBrMarguerita Merles9.15.2023 10:37 AM   BrGardiner SleeperM.D., Ph.D. Diseases & Surgery of the Retina and Vitreous [facility] have reviewed the above documentation for accuracy and completeness, and I agree with the above. BrGardiner SleeperM.D., Ph.D. 02/24/22 10:38  AM   Abbreviations: M myopia (nearsighted); A astigmatism; H hyperopia (farsighted); P presbyopia; Mrx spectacle prescription;  CTL contact lenses; OD right eye; OS left eye; OU both eyes  XT exotropia; ET esotropia; PEK punctate epithelial keratitis; PEE punctate epithelial erosions; DES dry eye syndrome; MGD meibomian gland dysfunction; ATs artificial tears; PFAT's preservative free artificial tears; NSIsabellauclear sclerotic cataract; PSC posterior subcapsular cataract; ERM epi-retinal membrane; PVD posterior vitreous detachment; RD retinal detachment; DM diabetes mellitus; DR diabetic retinopathy; NPDR non-proliferative diabetic retinopathy; PDR proliferative diabetic retinopathy; CSME clinically significant macular edema; DME diabetic macular edema; dbh dot blot hemorrhages; CWS cotton wool spot; POAG primary open angle glaucoma; C/D cup-to-disc ratio; HVF humphrey visual field; GVF goldmann visual field; OCT optical coherence tomography; IOP intraocular pressure; BRVO Branch retinal vein occlusion; CRVO central retinal vein occlusion; CRAO central retinal artery occlusion; BRAO branch retinal artery occlusion; RT retinal tear; SB scleral buckle; PPV pars plana vitrectomy; VH Vitreous hemorrhage; PRP panretinal laser photocoagulation; IVK intravitreal kenalog; VMT vitreomacular traction; MH Macular hole;  NVD neovascularization of the disc; NVE neovascularization elsewhere; AREDS age related eye disease study; ARMD age related macular degeneration; POAG primary open angle glaucoma; EBMD epithelial/anterior basement membrane dystrophy; ACIOL anterior chamber intraocular lens; IOL intraocular lens; PCIOL posterior chamber intraocular lens; Phaco/IOL phacoemulsification with intraocular lens placement; PRDow Cityhotorefractive keratectomy; LASIK laser assisted in situ keratomileusis; HTN hypertension; DM diabetes mellitus; COPD chronic obstructive pulmonary  disease

## 2022-02-24 ENCOUNTER — Ambulatory Visit (INDEPENDENT_AMBULATORY_CARE_PROVIDER_SITE_OTHER): Payer: Medicare HMO | Admitting: Ophthalmology

## 2022-02-24 ENCOUNTER — Encounter (INDEPENDENT_AMBULATORY_CARE_PROVIDER_SITE_OTHER): Payer: Self-pay | Admitting: Ophthalmology

## 2022-02-24 DIAGNOSIS — H25813 Combined forms of age-related cataract, bilateral: Secondary | ICD-10-CM | POA: Diagnosis not present

## 2022-02-24 DIAGNOSIS — I10 Essential (primary) hypertension: Secondary | ICD-10-CM

## 2022-02-24 DIAGNOSIS — H35033 Hypertensive retinopathy, bilateral: Secondary | ICD-10-CM

## 2022-02-24 DIAGNOSIS — H43821 Vitreomacular adhesion, right eye: Secondary | ICD-10-CM | POA: Diagnosis not present

## 2022-02-24 DIAGNOSIS — E113313 Type 2 diabetes mellitus with moderate nonproliferative diabetic retinopathy with macular edema, bilateral: Secondary | ICD-10-CM

## 2022-02-24 MED ORDER — FARICIMAB-SVOA 6 MG/0.05ML IZ SOLN
6.0000 mg | INTRAVITREAL | Status: AC | PRN
  Administered 2022-02-24: 6 mg via INTRAVITREAL

## 2022-02-28 ENCOUNTER — Other Ambulatory Visit: Payer: Self-pay | Admitting: Family Medicine

## 2022-03-15 NOTE — Progress Notes (Signed)
[facility] Note  03/24/2022     CHIEF COMPLAINT Patient presents for Retina Follow Up  HISTORY OF PRESENT ILLNESS: Erin Mitchell is a 76 y.o. female who presents to the clinic today for:   HPI     Retina Follow Up   Patient presents with  Diabetic Retinopathy.  In both eyes.  Severity is moderate.  Duration of 4 weeks.  Since onset it is stable.  I, the attending physician,  performed the HPI with the patient and updated documentation appropriately.        Comments   Patient feels that there has not been any changes in her vision. Her blood sugar was 146.      Last edited by Bernarda Caffey, MD on 03/24/2022  8:44 AM.    Pt states she has not noticed a change in [place].   Referring physician: Luetta Nutting, DO [address]  [address] [place],  [place] [address]  HISTORICAL INFORMATION:   Selected notes from the MEDICAL RECORD NUMBER Referred by Dr. Luetta Nutting for DM exam LEE:  Ocular Hx- PMH- DM, HTN, afib, kidney disease, last a1c was 6.8 on 05.05.22    CURRENT MEDICATIONS: No current outpatient medications on file. (Ophthalmic Drugs)   No current facility-administered medications for this visit. (Ophthalmic Drugs)   Current Outpatient Medications (Other)  Medication Sig   Alcohol Swabs (DROPSAFE ALCOHOL PREP) 70 % PADS USE AS DIRECTED.   Blood Glucose Monitoring Suppl (TRUE METRIX METER) w/Device KIT Use as directed to test blood glucose daily   Cholecalciferol (VITAMIN D-1000 MAX ST PO) Take 2,000 Units by mouth daily.   clopidogrel (PLAVIX) 75 MG tablet TAKE 1 TABLET EVERY DAY   doxycycline (VIBRA-TABS) 100 MG tablet Take 1 tablet (100 mg total) by mouth 2 (two) times daily.   glucose blood (TRUE METRIX BLOOD GLUCOSE TEST) test strip USE AS INSTRUCTED   loperamide (IMODIUM A-D) 2 MG tablet Take 2-4 mg by mouth 4 (four) times daily as needed for diarrhea or loose stools.   losartan (COZAAR) 100 MG tablet Take 1  tablet (100 mg total) by mouth daily.   Melatonin 10 MG CAPS Take by mouth.   metFORMIN (GLUCOPHAGE) 500 MG tablet TAKE 2 TABLETS TWICE DAILY WITH MEALS   metoprolol tartrate (LOPRESSOR) 25 MG tablet TAKE 1 TABLET TWICE DAILY WITH FOOD   NP THYROID 90 MG tablet TAKE 1 TABLET (90 MG TOTAL) BY MOUTH DAILY. NO REFILLS. NEEDS LABS.   PAPAYA ENZYME PO Take 2 capsules by mouth daily as needed (heartburn).   Probiotic Product (ALIGN) 4 MG CAPS Take 4 mg by mouth 2 (two) times daily.   rosuvastatin (CRESTOR) 40 MG tablet TAKE 1 TABLET EVERY DAY   TRUEplus Lancets 33G MISC Use as directed daily.   Zinc Sulfate (ZINC 15 PO) Take by mouth.   No current facility-administered medications for this visit. (Other)   REVIEW OF SYSTEMS: ROS   Positive for: Endocrine, Cardiovascular, Eyes Negative for: Constitutional, Gastrointestinal, Neurological, Skin, Genitourinary, Musculoskeletal, HENT, Respiratory, Psychiatric, Allergic/Imm, Heme/Lymph Last edited by Annie Paras, COT on 03/24/2022  7:53 AM.     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   VENTRAL HERNIA REPAIR N/A 12/28/2020   Procedure: LAPAROSCOPIC VENTRAL INCISIONAL HERNIA REPAIR WITH MESH;  Surgeon: Armandina Gemma, MD;  Location: [facility];  Service: General;  Laterality: N/A;   FAMILY HISTORY Family History  Problem Relation Age of Onset   Hypertension Father    Diabetes Brother    Emphysema Mother    SOCIAL HISTORY Social History   Tobacco Use   Smoking status: Former     Packs/day: 0.25    Years: 1.00    Total pack years: 0.25    Types: Cigarettes    Quit date: 06/06/1970    Years since quitting: 51.8   Smokeless tobacco: Never  Vaping Use   Vaping Use: Never used  Substance Use Topics   Alcohol use: Never   Drug use: Never       OPHTHALMIC EXAM: Base Eye Exam     Visual Acuity (Snellen - Linear)       Right Left   Dist cc 20/60 20/20   Dist ph cc 20/40     Correction: Glasses         Tonometry (Tonopen, 7:56 AM)       Right Left   Pressure 11 9         Pupils       Dark Light Shape React APD   Right 2 1 Round Minimal None   Left 2 1 Round Minimal None         Visual Fields       Left Right    Full Full         Extraocular Movement       Right Left    Full, Ortho Full, Ortho         Neuro/Psych     Oriented x3: Yes   Mood/Affect: Normal         Dilation     Both eyes: 2.5% Phenylephrine, 1.0% Mydriacyl @ 7:53 AM           Slit Lamp and Fundus Exam     Slit Lamp Exam       Right Left   Lids/Lashes Dermatochalasis - upper lid, Meibomian gland dysfunction Dermatochalasis - upper lid, Meibomian gland dysfunction   Conjunctiva/Sclera White and quiet White and quiet   Cornea arcus, 1+ fine PEE, trace tear film debris arcus, trace PEE, tear film debris   Anterior Chamber Moderate depth, clear, narrow angles deep, clear, narrow angles   Iris Round and moderately dilated, mild anterior bowing Round and moderately dilated, mild anterior bowing   Lens 2-3+ Nuclear sclerosis with brunescence, 2-3+ Cortical cataract 2-3+ Nuclear sclerosis with brunescence, 2+ Cortical cataract   Anterior Vitreous Vitreous syneresis, Posterior vitreous detachment Vitreous syneresis, Posterior vitreous detachment, vitreous condensations         Fundus Exam       Right Left   Disc Trace pallor, Sharp rim, +cupping Pink and Sharp, +cupping   C/D Ratio 0.7 0.65   Macula Blunted foveal reflex, central edema, RPE mottling,  cluster of MA temporal to fovea and temporal macula -- improving, mild ERM Flat, Blunted foveal reflex, RPE mottling, focal MA temp mac, no exudates  Vessels attenuated, Tortuous attenuated, Tortuous   Periphery Attached, mild reticular degeneration, mild MA Attached, mild reticular degeneration, scattered MA, cluster of DBH inf to disc-improved           Refraction     Wearing Rx       Sphere Cylinder Add   Right +1.25 Sphere +2.75   Left +1.50 Sphere +2.75    Type: Bifocal           IMAGING AND PROCEDURES  Imaging and Procedures for 03/24/2022  OCT, Retina - OU - Both Eyes       Right Eye Quality was good. Central Foveal Thickness: 428. Progression has improved. Findings include no SRF, abnormal foveal contour, intraretinal hyper-reflective material, intraretinal fluid, vitreomacular adhesion (Mild interval improvement in central cyst and surrounding cystic changes).   Left Eye Quality was good. Central Foveal Thickness: 260. Progression has been stable. Findings include no SRF, abnormal foveal contour, intraretinal fluid, vitreous traction, vitreomacular adhesion (persistent non-central cystic changes temporal macula, persistent central VMT).   Notes *Images captured and stored on drive  Diagnosis / Impression:  OD: +central DME, Mild interval improvement in central cyst and surrounding cystic changes OS: persistent non-central cystic changes temporal macula, persistent central VMT  Clinical management:  See below  Abbreviations: NFP - Normal foveal profile. CME - cystoid macular edema. PED - pigment epithelial detachment. IRF - intraretinal fluid. SRF - subretinal fluid. EZ - ellipsoid zone. ERM - epiretinal membrane. ORA - outer retinal atrophy. ORT - outer retinal tubulation. SRHM - subretinal hyper-reflective material. IRHM - intraretinal hyper-reflective material      Intravitreal Injection, Pharmacologic Agent - OD - Right Eye       Time  Out 03/24/2022. 8:12 AM. Confirmed correct patient, procedure, site, and patient consented.   Anesthesia Topical anesthesia was used. Anesthetic medications included Lidocaine 2%, Proparacaine 0.5%.   Procedure Preparation included 5% betadine to ocular surface, eyelid speculum. A (32g) needle was used.   Injection: 6 mg faricimab-svoa 6 MG/0.05ML   Route: Intravitreal, Site: Right Eye   NDC: [id number], Lot: [id number], Expiration date: 01/04/2024, Waste: 0 mL   Post-op Post injection exam found visual acuity of at least counting fingers. The patient tolerated the procedure well. There were no complications. The patient received written and verbal post procedure care education. Post injection medications were not given.            ASSESSMENT/PLAN:    ICD-10-CM   1. Moderate nonproliferative diabetic retinopathy of both eyes with macular edema associated with type 2 diabetes mellitus (HCC)  E11.3313 OCT, Retina - OU - Both Eyes    Intravitreal Injection, Pharmacologic Agent - OD - Right Eye    faricimab-svoa (VABYSMO) 11m/0.05mL intravitreal injection    2. Vitreomacular adhesion of right eye  H43.821     3. Essential hypertension  I10     4. Hypertensive retinopathy of both eyes  H35.033     5. Combined forms of age-related cataract of both eyes  H25.813      1. Moderate non-proliferative diabetic retinopathy, both eyes  - s/p IVE #1 OD (06.22.22)--sample, #2 (09.07.22), #3 (11.14.22), #4 (12.28.22), #5 (02.08.23), IVE #6 (03.15.23), #7 (04.19.23), #8 (05.24.23), #9 (06.28.23), #10 (07.27.23), #11 (08.24.23) -- IVE resistance  - s/p IVV OD #1 (09.21.23) [facility] [phone] - history of anti-VEGF injections since 2019 -- last injection IVE OD Feb/Mar 2022 -- q360monterval - exam w/ scattered MA -  OCT shows OD: +central DME, Mild interval improvement in central cyst and surrounding cystic changes; OS: persistent  non-central cystic changes temporal macula at 4 weeks - FA (11.14.22) shows OD: Focal hyperflourescence IT to fovea; no NV OS: No NV - BCVA 20/40 OD; 20/20 OS -- stable. - recommend IVV OD #2 today, 10.19.23 -- f/u again in 4 wks - pt wishes to proceed - RBA of procedure discussed, questions answered - IVE informed consent obtained and re-signed today, 06.28.23 - IVV informed consent obtained and signed, 09.21.23 - see procedure note - f/u in 4wks  -- DFE/OCT, possible injection(s)  2. VMA/VMT OD  - central VMA/VMT OD may be contributing to central cyst  - monitor  3,4. Hypertensive retinopathy OU - discussed importance of tight BP control - monitor  5. Mixed Cataract OU - The symptoms of cataract, surgical options, and treatments and risks were discussed with patient. - discussed diagnosis and progression - approaching visual significance -- pt reports significant glare symptoms at night - pt wishes to continue to monitor for now  Ophthalmic Meds Ordered this visit:  Meds ordered this encounter  Medications   faricimab-svoa (VABYSMO) 39m/0.05mL intravitreal injection     Return in about 4 weeks (around 04/21/2022) for NPDR OU , DFE, OCT, possible injection.  There are no Patient Instructions on file for this visit.   This document serves as a record of services personally performed by BGardiner Sleeper MD, PhD. It was created on their behalf by ASan Jetty BOwens Shark OA an ophthalmic technician. The creation of this record is the provider's dictation and/or activities during the visit.    Electronically signed by: ASan Jetty BOwens Shark OA 10.10.2023 10:56 AM   This document serves as a record of services personally performed by BGardiner Sleeper MD, PhD. It was created on their behalf by MOrvan Falconer an ophthalmic technician. The creation of this record is the provider's dictation and/or activities during the visit.    Electronically signed by: MOrvan Falconer OA, 03/24/22   10:56 AM  BGardiner Sleeper M.D., Ph.D. Diseases & Surgery of the Retina and Vitreous [facility] I have reviewed the above documentation for accuracy and completeness, and I agree with the above. BGardiner Sleeper M.D., Ph.D. 03/24/22 10:56 AM  Abbreviations: M myopia (nearsighted); A astigmatism; H hyperopia (farsighted); P presbyopia; Mrx spectacle prescription;  CTL contact lenses; OD right eye; OS left eye; OU both eyes  XT exotropia; ET esotropia; PEK punctate epithelial keratitis; PEE punctate epithelial erosions; DES dry eye syndrome; MGD meibomian gland dysfunction; ATs artificial tears; PFAT's preservative free artificial tears; N[place]uclear sclerotic cataract; PSC posterior subcapsular cataract; ERM epi-retinal membrane; PVD posterior vitreous detachment; RD retinal detachment; DM diabetes mellitus; DR diabetic retinopathy; NPDR non-proliferative diabetic retinopathy; PDR proliferative diabetic retinopathy; CSME clinically significant macular edema; DME diabetic macular edema; dbh dot blot hemorrhages; CWS cotton wool spot; POAG primary open angle glaucoma; C/D cup-to-disc ratio; HVF humphrey visual field; GVF goldmann visual field; OCT optical coherence tomography; IOP intraocular pressure; BRVO Branch retinal vein occlusion; CRVO central retinal vein occlusion; CRAO central retinal artery occlusion; BRAO branch retinal artery occlusion; RT retinal tear; SB scleral buckle; PPV pars plana vitrectomy; VH Vitreous hemorrhage; PRP panretinal laser photocoagulation; IVK intravitreal kenalog; VMT vitreomacular traction; MH Macular hole;  NVD neovascularization of the disc; NVE neovascularization elsewhere; AREDS age related eye disease study; ARMD age related macular degeneration; POAG primary open angle glaucoma; EBMD epithelial/anterior basement membrane dystrophy; ACIOL  anterior chamber intraocular lens; IOL intraocular lens; PCIOL posterior chamber intraocular lens; Phaco/IOL  phacoemulsification with intraocular lens placement; [place] photorefractive keratectomy; LASIK laser assisted in situ keratomileusis; HTN hypertension; DM diabetes mellitus; COPD chronic obstructive pulmonary disease

## 2022-03-24 ENCOUNTER — Encounter (INDEPENDENT_AMBULATORY_CARE_PROVIDER_SITE_OTHER): Payer: Self-pay | Admitting: Ophthalmology

## 2022-03-24 ENCOUNTER — Ambulatory Visit (INDEPENDENT_AMBULATORY_CARE_PROVIDER_SITE_OTHER): Payer: Medicare HMO | Admitting: Ophthalmology

## 2022-03-24 DIAGNOSIS — H25813 Combined forms of age-related cataract, bilateral: Secondary | ICD-10-CM | POA: Diagnosis not present

## 2022-03-24 DIAGNOSIS — I10 Essential (primary) hypertension: Secondary | ICD-10-CM

## 2022-03-24 DIAGNOSIS — H43821 Vitreomacular adhesion, right eye: Secondary | ICD-10-CM | POA: Diagnosis not present

## 2022-03-24 DIAGNOSIS — H35033 Hypertensive retinopathy, bilateral: Secondary | ICD-10-CM

## 2022-03-24 DIAGNOSIS — E113313 Type 2 diabetes mellitus with moderate nonproliferative diabetic retinopathy with macular edema, bilateral: Secondary | ICD-10-CM

## 2022-03-24 MED ORDER — FARICIMAB-SVOA 6 MG/0.05ML IZ SOLN
6.0000 mg | INTRAVITREAL | Status: AC | PRN
  Administered 2022-03-24: 6 mg via INTRAVITREAL

## 2022-03-28 ENCOUNTER — Other Ambulatory Visit: Payer: Self-pay | Admitting: Family Medicine

## 2022-04-01 ENCOUNTER — Other Ambulatory Visit (INDEPENDENT_AMBULATORY_CARE_PROVIDER_SITE_OTHER): Payer: Self-pay

## 2022-04-01 NOTE — Progress Notes (Signed)
[facility] Note  04/01/2022     CHIEF COMPLAINT Patient presents for No chief complaint on file.  HISTORY OF PRESENT ILLNESS: Erin Mitchell is a 76 y.o. female who presents to the clinic today for:    Pt states she has not noticed a change in [place].   Referring physician: No referring provider defined for this encounter.  HISTORICAL INFORMATION:   Selected notes from the MEDICAL RECORD NUMBER Referred by Dr. Luetta Nutting for DM exam LEE:  Ocular Hx- PMH- DM, HTN, afib, kidney disease, last a1c was 6.8 on 05.05.22    CURRENT MEDICATIONS: No current outpatient medications on file. (Ophthalmic Drugs)   No current facility-administered medications for this visit. (Ophthalmic Drugs)   Current Outpatient Medications (Other)  Medication Sig   Alcohol Swabs (DROPSAFE ALCOHOL PREP) 70 % PADS USE AS DIRECTED.   Blood Glucose Monitoring Suppl (TRUE METRIX METER) w/Device KIT Use as directed to test blood glucose daily   Cholecalciferol (VITAMIN D-1000 MAX ST PO) Take 2,000 Units by mouth daily.   clopidogrel (PLAVIX) 75 MG tablet TAKE 1 TABLET EVERY DAY   doxycycline (VIBRA-TABS) 100 MG tablet Take 1 tablet (100 mg total) by mouth 2 (two) times daily.   glucose blood (TRUE METRIX BLOOD GLUCOSE TEST) test strip USE AS INSTRUCTED   loperamide (IMODIUM A-D) 2 MG tablet Take 2-4 mg by mouth 4 (four) times daily as needed for diarrhea or loose stools.   losartan (COZAAR) 100 MG tablet Take 1 tablet (100 mg total) by mouth daily.   Melatonin 10 MG CAPS Take by mouth.   metFORMIN (GLUCOPHAGE) 500 MG tablet TAKE 2 TABLETS TWICE DAILY WITH MEALS   metoprolol tartrate (LOPRESSOR) 25 MG tablet TAKE 1 TABLET TWICE DAILY WITH FOOD   NP THYROID 90 MG tablet TAKE 1 TABLET (90 MG TOTAL) BY MOUTH DAILY. NO REFILLS. NEEDS LABS.   PAPAYA ENZYME PO Take 2 capsules by mouth daily as needed (heartburn).   Probiotic Product (ALIGN) 4 MG CAPS Take 4 mg by mouth 2  (two) times daily.   rosuvastatin (CRESTOR) 40 MG tablet TAKE 1 TABLET EVERY DAY   TRUEplus Lancets 33G MISC Use as directed daily.   Zinc Sulfate (ZINC 15 PO) Take by mouth.   No current facility-administered medications for this visit. 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   VENTRAL HERNIA REPAIR N/A 12/28/2020   Procedure: LAPAROSCOPIC VENTRAL [place];  Surgeon: Armandina Gemma, MD;  Location: [facility];  Service: General;  Laterality: N/A;   FAMILY HISTORY Family History  Problem Relation Age of Onset   Hypertension Father    Diabetes Brother    Emphysema Mother    SOCIAL HISTORY Social History   Tobacco Use   Smoking status: Former    Packs/day: 0.25  Years: 1.00    Total pack years: 0.25    Types: Cigarettes    Quit date: 06/06/1970    Years since quitting: 51.8   Smokeless tobacco: Never  Vaping Use   Vaping Use: Never used  Substance Use Topics   Alcohol use: Never   Drug use: Never       OPHTHALMIC EXAM: Not recorded    IMAGING AND PROCEDURES  Imaging and Procedures for 04/01/2022          ASSESSMENT/PLAN:  No diagnosis found.  1. Moderate non-proliferative diabetic retinopathy, both eyes  - s/p IVE #1 OD (06.22.22)--sample, #2 (09.07.22), #3 (11.14.22), #4 (12.28.22), #5 (02.08.23), IVE #6 (03.15.23), #7 (04.19.23), #8 (05.24.23), #9 (06.28.23), #10 (07.27.23), #11 (08.24.23) -- IVE resistance  - s/p IVV OD #1 (09.21.23), #2 (10.19.23) -  previously seen at [facility] by Dr. Theora Gianotti (Fax [phone]) - history of anti-VEGF injections since 2019 -- last injection IVE OD Feb/Mar 2022 -- q43mointerval - exam w/ scattered MA - OCT shows OD: +central DME, Mild interval improvement in central cyst and surrounding cystic changes; OS: persistent non-central cystic changes temporal macula at 4 weeks - FA (11.14.22) shows OD: Focal hyperflourescence IT to fovea; no NV OS: No NV - BCVA 20/40 OD; 20/20 OS -- stable. - recommend IVV OD #3 today, 11.16.23 -- f/u again in 4 wks - pt wishes to proceed - RBA of procedure discussed, questions answered - IVE informed consent obtained and re-signed today, 06.28.23 - IVV informed consent obtained and signed, 09.21.23 - see procedure note - f/u in 4wks  -- DFE/OCT, possible injection(s)  2. VMA/VMT OD  - central VMA/VMT OD may be contributing to central cyst  - monitor  3,4. Hypertensive retinopathy OU - discussed importance of tight BP control - monitor  5. Mixed Cataract OU - The symptoms of cataract, surgical options, and treatments and risks were discussed with patient. - discussed diagnosis and progression - approaching visual significance -- pt reports significant glare symptoms at night - pt wishes to continue to monitor for now  Ophthalmic Meds Ordered this visit:  No orders of the defined types were placed in this encounter.    No follow-ups on file.  There are no Patient Instructions on file for this visit.   This document serves as a record of services personally performed by BGardiner Sleeper MD, PhD. It was created on their behalf by ASan Jetty BOwens Shark OA an ophthalmic technician. The creation of this record is the provider's dictation and/or activities during the visit.    Electronically signed by: ASan Jetty BOwens Shark O[place]10.27.2023 12:46 PM   BGardiner Sleeper M.D., Ph.D. Diseases & Surgery of the Retina and Vitreous [facility] M myopia (nearsighted); A astigmatism; H hyperopia (farsighted); P presbyopia; Mrx spectacle prescription;  CTL contact lenses; OD right eye; OS left eye; OU both eyes  XT exotropia; ET esotropia; PEK punctate epithelial keratitis; PEE punctate epithelial erosions; DES dry eye syndrome; MGD meibomian gland dysfunction; ATs artificial tears; PFAT's preservative free artificial tears; [place] sclerotic cataract; PSC posterior subcapsular cataract; ERM epi-retinal membrane; PVD posterior vitreous detachment; RD retinal detachment; DM diabetes mellitus; DR diabetic retinopathy; NPDR non-proliferative diabetic retinopathy; PDR proliferative diabetic retinopathy; CSME clinically significant macular edema; DME diabetic macular edema; dbh dot blot hemorrhages; CWS cotton wool spot; POAG primary open angle glaucoma; C/D cup-to-disc ratio; HVF humphrey visual field; GVF goldmann visual field; OCT optical  coherence tomography; IOP intraocular pressure; BRVO Branch retinal vein occlusion; CRVO central retinal vein occlusion; CRAO central retinal artery occlusion; BRAO branch retinal artery occlusion; RT retinal tear; SB scleral buckle; PPV pars plana vitrectomy; VH Vitreous hemorrhage; PRP panretinal laser photocoagulation; IVK intravitreal kenalog; VMT vitreomacular traction; MH Macular hole;  NVD neovascularization of the disc; NVE neovascularization elsewhere; AREDS age related eye disease study; ARMD age related macular degeneration; POAG primary open angle glaucoma; EBMD epithelial/anterior basement membrane dystrophy; ACIOL anterior chamber intraocular lens; IOL intraocular lens; PCIOL posterior chamber intraocular lens; Phaco/IOL phacoemulsification with intraocular lens placement; [place] photorefractive keratectomy; LASIK laser assisted in situ keratomileusis; HTN hypertension; DM diabetes mellitus; COPD chronic obstructive pulmonary disease

## 2022-04-06 ENCOUNTER — Other Ambulatory Visit: Payer: Self-pay | Admitting: Family Medicine

## 2022-04-06 DIAGNOSIS — I480 Paroxysmal atrial fibrillation: Secondary | ICD-10-CM

## 2022-04-06 DIAGNOSIS — I739 Peripheral vascular disease, unspecified: Secondary | ICD-10-CM

## 2022-04-12 NOTE — Progress Notes (Signed)
[facility] Note  04/21/2022     CHIEF COMPLAINT Patient presents for Retina Follow Up  HISTORY OF PRESENT ILLNESS: Erin Mitchell is a 76 y.o. female who presents to the clinic today for:   HPI     Retina Follow Up   Patient presents with  Diabetic Retinopathy.  In both eyes.  Severity is moderate.  Duration of 4 weeks.  Since onset it is stable.  I, the attending physician,  performed the HPI with the patient and updated documentation appropriately.        Comments   Patient states that she feels like she needs a new glasses rx. She feels that the vision is the same. Her blood sugar was 165 and her A1C is 6.6. She is using AT's OU PRN - rare.      Last edited by Bernarda Caffey, MD on 04/21/2022  9:29 AM.    Pt feels like left eye vision has decreased, eyes feel scratchy throughout the day  Referring physician: Luetta Nutting, DO [address],  [place] [address]  HISTORICAL INFORMATION:   Selected notes from the MEDICAL RECORD NUMBER Referred by Dr. Luetta Nutting for DM exam LEE:  Ocular Hx- PMH- DM, HTN, afib, kidney disease, last a1c was 6.8 on 05.05.22    CURRENT MEDICATIONS: No current outpatient medications on file. (Ophthalmic Drugs)   No current facility-administered medications for this visit. (Ophthalmic Drugs)   Current Outpatient Medications (Other)  Medication Sig   Alcohol Swabs (DROPSAFE ALCOHOL PREP) 70 % PADS USE AS DIRECTED.   Blood Glucose Monitoring Suppl (TRUE METRIX METER) w/Device KIT Use as directed to test blood glucose daily   Cholecalciferol (VITAMIN D-1000 MAX ST PO) Take 2,000 Units by mouth daily.   clopidogrel (PLAVIX) 75 MG tablet TAKE 1 TABLET EVERY DAY   doxycycline (VIBRA-TABS) 100 MG tablet Take 1 tablet (100 mg total) by mouth 2 (two) times daily. (Patient not taking: Reported on 04/20/2022)   glucose blood (TRUE METRIX BLOOD GLUCOSE TEST) test strip USE AS  INSTRUCTED   loperamide (IMODIUM A-D) 2 MG tablet Take 2-4 mg by mouth 4 (four) times daily as needed for diarrhea or loose stools.   losartan (COZAAR) 100 MG tablet Take 1 tablet (100 mg total) by mouth daily.   Melatonin 10 MG CAPS Take by mouth.   metFORMIN (GLUCOPHAGE-XR) 500 MG 24 hr tablet Take 2 tablets (1,000 mg total) by mouth daily with breakfast.   metoprolol tartrate (LOPRESSOR) 25 MG tablet TAKE 1 TABLET TWICE DAILY WITH FOOD   NP THYROID 90 MG tablet TAKE 1 TABLET (90 MG TOTAL) BY MOUTH DAILY. NO REFILLS. NEEDS LABS.   PAPAYA ENZYME PO Take 2 capsules by mouth daily as needed (heartburn).   Probiotic Product (ALIGN) 4 MG CAPS Take 4 mg by mouth 2 (two) times daily.   rosuvastatin (CRESTOR) 40 MG tablet TAKE 1 TABLET EVERY DAY   TRUEplus Lancets 33G MISC Use as directed daily.   Zinc Sulfate (ZINC 15 PO) Take by mouth.   No current facility-administered medications for this visit. (Other)   REVIEW OF SYSTEMS: ROS   Positive for: Endocrine, Cardiovascular, Eyes Negative for: Constitutional, Gastrointestinal, Neurological, Skin, Genitourinary, Musculoskeletal, HENT, Respiratory, Psychiatric, Allergic/Imm, Heme/Lymph Last edited by Annie Paras, COT on 04/21/2022  8:10 AM.      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 & 3rd   VENTRAL HERNIA REPAIR N/A 12/28/2020   Procedure: LAPAROSCOPIC VENTRAL [place];  Surgeon: Armandina Gemma, MD;  Location: [facility];  Service: General;  Laterality: N/A;   FAMILY  HISTORY Family History  Problem Relation Age of Onset   Hypertension Father    Diabetes Brother    Emphysema Mother    SOCIAL HISTORY Social History   Tobacco Use   Smoking status: Former    Packs/day: 0.25    Years: 1.00    Total pack years: 0.25    Types: Cigarettes    Quit date: 06/06/1970    Years since quitting: 51.9   Smokeless tobacco: Never  Vaping Use   Vaping Use: Never used  Substance Use Topics   Alcohol use: Never   Drug use: Never       OPHTHALMIC EXAM: Base Eye Exam     Visual Acuity (Snellen - Linear)       Right Left   Dist cc 20/80 +1 20/20   Dist ph cc 20/60     Correction: Glasses         Tonometry (Tonopen, 8:14 AM)       Right Left   Pressure 16 16         Pupils       Dark Light Shape React APD   Right 2 1 Round Minimal None   Left 2 1 Round Minimal None         Visual Fields       Left Right    Full Full         Extraocular Movement       Right Left    Full, Ortho Full, Ortho         Neuro/Psych     Oriented x3: Yes   Mood/Affect: Normal         Dilation     Both eyes: 1.0% Mydriacyl, 2.5% Phenylephrine @ 8:11 AM           Slit Lamp and Fundus Exam     Slit Lamp Exam       Right Left   Lids/Lashes Dermatochalasis - upper lid, Meibomian gland dysfunction Dermatochalasis - upper lid, Meibomian gland dysfunction   Conjunctiva/Sclera White and quiet White and quiet   Cornea arcus, trace PEE, mild tear film debris arcus, trace PEE, mild tear film debris   Anterior Chamber Moderate depth, clear, very narrow temporal angles Moderate depth, clear, very narrow temporal angles   Iris Round and moderately dilated, mild anterior bowing Round and moderately dilated, mild anterior bowing   Lens 2-3+ Nuclear sclerosis with brunescence, 2-3+ Cortical cataract 2-3+ Nuclear sclerosis with brunescence, 2-3+ Cortical cataract   Anterior Vitreous Vitreous syneresis, Posterior vitreous detachment Vitreous syneresis,  Posterior vitreous detachment, vitreous condensations         Fundus Exam       Right Left   Disc Trace pallor, Sharp rim, +cupping Pink and  Sharp, +cupping   C/D Ratio 0.7 0.7   Macula Blunted foveal reflex, central edema, RPE mottling, cluster of MA temporal to fovea and temporal macula -- improving, mild ERM Flat, Blunted foveal reflex, RPE mottling, focal MA temp mac, no exudates   Vessels attenuated, Tortuous attenuated, Tortuous   Periphery Attached, mild reticular degeneration, mild MA Attached, mild reticular degeneration, scattered MA, cluster of DBH inf to disc-improved           Refraction     Wearing Rx       Sphere Cylinder Add   Right +1.25 Sphere +2.75   Left +1.50 Sphere +2.75    Type: Bifocal           IMAGING AND PROCEDURES  Imaging and Procedures for 04/21/2022  OCT, Retina - OU - Both Eyes       Right Eye Quality was good. Central Foveal Thickness: 428. Progression has been stable. Findings include no SRF, abnormal foveal contour, intraretinal hyper-reflective material, intraretinal fluid, vitreomacular adhesion (Persistent prominent central cyst and surrounding cystic changes).   Left Eye Quality was good. Central Foveal Thickness: 261. Progression has been stable. Findings include no SRF, abnormal foveal contour, intraretinal fluid, vitreous traction, vitreomacular adhesion (persistent non-central cystic changes temporal macula, persistent central VMT).   Notes *Images captured and stored on drive  Diagnosis / Impression:  OD: +central DME, Persistent prominent central cyst and surrounding cystic changes OS: persistent non-central cystic changes temporal macula, persistent central VMT  Clinical management:  See below  Abbreviations: NFP - Normal foveal profile. CME - cystoid macular edema. PED - pigment epithelial detachment. IRF - intraretinal fluid. SRF - subretinal fluid. EZ - ellipsoid zone. ERM - epiretinal membrane. ORA - outer  retinal atrophy. ORT - outer retinal tubulation. SRHM - subretinal hyper-reflective material. IRHM - intraretinal hyper-reflective material      Intravitreal Injection, Pharmacologic Agent - OD - Right Eye       Time Out 04/21/2022. 8:15 AM. Confirmed correct patient, procedure, site, and patient consented.   Anesthesia Topical anesthesia was used. Anesthetic medications included Lidocaine 2%, Proparacaine 0.5%.   Procedure Preparation included 5% betadine to ocular surface, eyelid speculum. A (32g) needle was used.   Injection: 6 mg faricimab-svoa 6 MG/0.05ML   Route: Intravitreal, Site: Right Eye   NDC: [id number], Lot: [id number], Expiration date: 05/05/2024, Waste: 0 mL   Post-op Post injection exam found visual acuity of at least counting fingers. The patient tolerated the procedure well. There were no complications. The patient received written and verbal post procedure care education. Post injection medications were not given.            ASSESSMENT/PLAN:    ICD-10-CM   1. Moderate nonproliferative diabetic retinopathy of both eyes with macular edema associated with type 2 diabetes mellitus (HCC)  E11.3313 OCT, Retina - OU - Both Eyes    Intravitreal Injection, Pharmacologic Agent - OD - Right Eye    faricimab-svoa (VABYSMO) 57m/0.05mL intravitreal injection    2. Vitreomacular adhesion of right eye  H43.821     3. Essential hypertension  I10     4. Hypertensive retinopathy of both eyes  H35.033     5. Combined forms of age-related cataract of both eyes  H25.813      1. Moderate non-proliferative diabetic retinopathy, both eyes  - s/p IVE #1 OD (06.22.22)--sample, #2 (09.07.22), #3 (11.14.22), #4 (12.28.22), #5 (02.08.23), IVE #6 (03.15.23), #7 (04.19.23), #8 (05.24.23), #9 (06.28.23), #10 (07.27.23), #11 (08.24.23) -- IVE resistance  -  s/p IVV OD #1 (09.21.23), #2 (10.19.23) - previously seen at [facility] by Dr. Theora Gianotti (Fax [phone]) -  history of anti-VEGF injections since 2019 -- last injection IVE OD Feb/Mar 2022 -- q25mointerval - exam w/ scattered MA - OCT shows OD: +central DME, persistent central cyst and surrounding cystic changes; OS: persistent non-central cystic changes temporal macula at 4 weeks - FA (11.14.22) shows OD: Focal hyperflourescence IT to fovea; no NV; OS: No NV - BCVA 20/60 -- decreased; OD; 20/20 OS -- stable. - recommend IVV OD #3 today, 11.16.23 -- f/u again in 4 wks - pt wishes to proceed - RBA of procedure discussed, questions answered - IVE informed consent obtained and re-signed today, 06.28.23 - IVV informed consent obtained and signed, 09.21.23 - see procedure note - f/u in 4wks  -- DFE/OCT, possible injection(s)  2. VMA/VMT OD  - central VMA/VMT OD may be contributing to central cyst  - monitor  3,4. Hypertensive retinopathy OU - discussed importance of tight BP control - monitor  5. Mixed Cataract OU - The symptoms of cataract, surgical options, and treatments and risks were discussed with patient. - discussed diagnosis and progression - will refer to Dr. SNancy Fetterfor cataract and glaucoma eval  6. Glaucoma suspect  - C/D 0.7 OU  - IOPs okay  - will refer to Dr. SNancy Fetterfor glaucoma eval  Ophthalmic Meds Ordered this visit:  Meds ordered this encounter  Medications   faricimab-svoa (VABYSMO) 640m0.05mL intravitreal injection     Return in about 4 weeks (around 05/19/2022) for f/u NPDR OU, DFE, OCT, Possible Injxn.  There are no Patient Instructions on file for this visit.   This document serves as a record of services personally performed by BrGardiner SleeperMD, PhD. It was created on their behalf by AmSan JettyBrOwens SharkOA an ophthalmic technician. The creation of this record is the provider's dictation and/or activities during the visit.    Electronically signed by: AmSan JettyBrOwens SharkOANew York0.27.2023 9:33 AM  BrGardiner SleeperM.D., Ph.D. Diseases & Surgery of the Retina and  Vitreous [facility] have reviewed the above documentation for accuracy and completeness, and I agree with the above. BrGardiner SleeperM.D., Ph.D. 04/21/22 9:33 AM  Abbreviations: M myopia (nearsighted); A astigmatism; H hyperopia (farsighted); P presbyopia; Mrx spectacle prescription;  CTL contact lenses; OD right eye; OS left eye; OU both eyes  XT exotropia; ET esotropia; PEK punctate epithelial keratitis; PEE punctate epithelial erosions; DES dry eye syndrome; MGD meibomian gland dysfunction; ATs artificial tears; PFAT's preservative free artificial tears; NSForesthilluclear sclerotic cataract; PSC posterior subcapsular cataract; ERM epi-retinal membrane; PVD posterior vitreous detachment; RD retinal detachment; DM diabetes mellitus; DR diabetic retinopathy; NPDR non-proliferative diabetic retinopathy; PDR proliferative diabetic retinopathy; CSME clinically significant macular edema; DME diabetic macular edema; dbh dot blot hemorrhages; CWS cotton wool spot; POAG primary open angle glaucoma; C/D cup-to-disc ratio; HVF humphrey visual field; GVF goldmann visual field; OCT optical coherence tomography; IOP intraocular pressure; BRVO Branch retinal vein occlusion; CRVO central retinal vein occlusion; CRAO central retinal artery occlusion; BRAO branch retinal artery occlusion; RT retinal tear; SB scleral buckle; PPV pars plana vitrectomy; VH Vitreous hemorrhage; PRP panretinal laser photocoagulation; IVK intravitreal kenalog; VMT vitreomacular traction; MH Macular hole;  NVD neovascularization of the disc; NVE neovascularization elsewhere; AREDS age related eye disease study; ARMD age related macular degeneration; POAG primary open angle glaucoma; EBMD epithelial/anterior basement membrane dystrophy; ACIOL anterior chamber  intraocular lens; IOL intraocular lens; PCIOL posterior chamber intraocular lens; Phaco/IOL phacoemulsification with intraocular lens placement; [place] photorefractive  keratectomy; LASIK laser assisted in situ keratomileusis; HTN hypertension; DM diabetes mellitus; COPD chronic obstructive pulmonary disease

## 2022-04-20 ENCOUNTER — Ambulatory Visit (INDEPENDENT_AMBULATORY_CARE_PROVIDER_SITE_OTHER): Payer: Medicare HMO | Admitting: Family Medicine

## 2022-04-20 ENCOUNTER — Encounter: Payer: Self-pay | Admitting: Family Medicine

## 2022-04-20 VITALS — BP 126/76 | HR 71 | Ht 64.0 in | Wt 159.1 lb

## 2022-04-20 DIAGNOSIS — E1152 Type 2 diabetes mellitus with diabetic peripheral angiopathy with gangrene: Secondary | ICD-10-CM

## 2022-04-20 DIAGNOSIS — E785 Hyperlipidemia, unspecified: Secondary | ICD-10-CM

## 2022-04-20 DIAGNOSIS — E039 Hypothyroidism, unspecified: Secondary | ICD-10-CM

## 2022-04-20 DIAGNOSIS — I480 Paroxysmal atrial fibrillation: Secondary | ICD-10-CM | POA: Diagnosis not present

## 2022-04-20 DIAGNOSIS — E1169 Type 2 diabetes mellitus with other specified complication: Secondary | ICD-10-CM

## 2022-04-20 DIAGNOSIS — Z23 Encounter for immunization: Secondary | ICD-10-CM

## 2022-04-20 DIAGNOSIS — I10 Essential (primary) hypertension: Secondary | ICD-10-CM | POA: Diagnosis not present

## 2022-04-20 DIAGNOSIS — R152 Fecal urgency: Secondary | ICD-10-CM | POA: Diagnosis not present

## 2022-04-20 MED ORDER — METFORMIN HCL ER 500 MG PO TB24: 1000.0000 mg | ORAL_TABLET | Freq: Every day | ORAL | 1 refills | Status: DC

## 2022-04-20 NOTE — Assessment & Plan Note (Signed)
Blood pressures well controlled at this time.  Recommend continuation of current medications for management of her hypertension.

## 2022-04-20 NOTE — Assessment & Plan Note (Signed)
She remains on NP thyroid.  Feels well with this.  Updating TSH.

## 2022-04-20 NOTE — Patient Instructions (Signed)
Let's try changing metformin to metformin ER and see if this helps with stools.  Referral placed to gastroenterology as well.  See me again in 6 months.

## 2022-04-20 NOTE — Assessment & Plan Note (Signed)
Her diabetes has been fairly well controlled.  I am going to try switching her from metformin to metformin XR since she has had issues with recurrent loose stool.  Updating labs today.

## 2022-04-20 NOTE — Assessment & Plan Note (Signed)
Referral placed to GI for recurrent loose stools and fecal urgency.

## 2022-04-20 NOTE — Assessment & Plan Note (Signed)
Rate controlled at this time.  She continues to see cardiology on a regular basis.

## 2022-04-20 NOTE — Progress Notes (Signed)
Erin Mitchell - 76 y.o. female MRN [id number]  Date of birth: 31-Aug-1945  Subjective Chief Complaint  Patient presents with   Hypertension   Diabetes    HPI Erin Mitchell is a 76 year old female here today for follow-up visit.    She reports she is doing well at this time.  She has had recurrent episodes of loose stools.  She denies any blood in her stool.  She has not had any nausea or changes to appetite associated with this.  She continues on metformin for management of her diabetes.  She feels like she is doing pretty well with this.  She denies new symptoms related to her diabetes.  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  Blood pressure remains well controlled with losartan and Lopressor.  She did bring her home cuff for comparison today.  Denies chest pain, shortness of breath, palpitations, headaches or vision changes.  ROS:  A comprehensive ROS was completed and negative except as noted per HPI  No Known Allergies  Past Medical History:  Diagnosis Date   Atrial fibrillation (HCC)    on Plavix   Cataract    Diabetes ([place])    Diabetic retinopathy ([place])    Exposure to Agent Orange    Hypertension    Hypertensive retinopathy    hypothyroidism    Hypothyroidism    Kidney disease     Past Surgical History:  Procedure Laterality Date   APPENDECTOMY     CESAREAN SECTION     CHOLECYSTECTOMY     HERNIA REPAIR     TOE AMPUTATION Right    2nd & 3rd   VENTRAL HERNIA REPAIR N/A 12/28/2020   Procedure: LAPAROSCOPIC VENTRAL [place];  Surgeon: Armandina Gemma, MD;  Location: [facility];  Service: General;  Laterality: N/A;    Social History   Socioeconomic History   Marital status: Married    Spouse name: Not on file   Number of children: 7   Years of education: Not on file   Highest education level: Not on file  Occupational History   Occupation: Retired  Tobacco Use    Smoking status: Former    Packs/day: 0.25    Years: 1.00    Total pack years: 0.25    Types: Cigarettes    Quit date: 06/06/1970    Years since quitting: 51.9   Smokeless tobacco: Never  Vaping Use   Vaping Use: Never used  Substance and Sexual Activity   Alcohol use: Never   Drug use: Never   Sexual activity: Not Currently    Partners: Male  Other Topics Concern   Not on file  Social History Narrative   Not on file   Social Determinants of Health   Financial Resource Strain: Not on file  Food Insecurity: Not on file  Transportation Needs: Not on file  Physical Activity: Not on file  Stress: Not on file  Social Connections: Not on file    Family History  Problem Relation Age of Onset   Hypertension Father    Diabetes Brother    Emphysema Mother     Health Maintenance  Topic Date Due   Medicare Annual Wellness (AWV)  Never done   FOOT EXAM  10/16/2021   HEMOGLOBIN A1C  04/14/2022   COVID-19 Vaccine (3 - Pfizer series) 05/06/2022 (Originally 09/25/2019)   Zoster Vaccines- Shingrix (1 of 2) 07/21/2022 (Originally 04/12/1998)   OPHTHALMOLOGY EXAM  09/23/2022   Diabetic kidney evaluation - GFR measurement  10/13/2022   Diabetic kidney evaluation - Urine ACR  10/13/2022   TETANUS/TDAP  08/15/2029   Pneumonia Vaccine 24+ Years old  Completed   INFLUENZA VACCINE  Completed   HPV VACCINES  Aged Out   MAMMOGRAM  Discontinued   DEXA SCAN  Discontinued   COLONOSCOPY (Pts 45-53yrs Insurance coverage will need to be confirmed)  Discontinued   Hepatitis C Screening  Discontinued     ----------------------------------------------------------------------------------------------------------------------------------------------------------------------------------------------------------------- Physical Exam BP 126/76 Comment: home health bp  Pulse 71   Ht 5\' 4"  (1.626 m)   Wt 159 lb 1.9 oz (72.2 kg)   SpO2 100%   BMI 27.31 kg/m   Physical Exam Constitutional:       Appearance: Normal appearance.  Eyes:     General: No scleral icterus. Cardiovascular:     Rate and Rhythm: Normal rate and regular rhythm.  Pulmonary:     Effort: Pulmonary effort is normal.     Breath sounds: Normal breath sounds.  Musculoskeletal:     Cervical back: Neck supple.  Neurological:     Mental Status: She is alert.  Psychiatric:        Mood and Affect: Mood normal.        Behavior: Behavior normal.     ------------------------------------------------------------------------------------------------------------------------------------------------------------------------------------------------------------------- Assessment and Plan  PAF (paroxysmal atrial fibrillation) (HCC) Rate controlled at this time.  She continues to see cardiology on a regular basis.  Type 2 diabetes mellitus with diabetic peripheral angiopathy and gangrene, without long-term current use of insulin (HCC) Her diabetes has been fairly well controlled.  I am going to try switching her from metformin to metformin XR since she has had issues with recurrent loose stool.  Updating labs today.  Hypertension  Blood pressures well controlled at this time.  Recommend continuation of current medications for management of her hypertension.  Acquired hypothyroidism She remains on NP thyroid.  Feels well with this.  Updating TSH.  Fecal urgency Referral placed to GI for recurrent loose stools and fecal urgency.   Meds ordered this encounter  Medications   DISCONTD: metFORMIN (GLUCOPHAGE-XR) 500 MG 24 hr tablet    Sig: Take 2 tablets (1,000 mg total) by mouth daily with breakfast.    Dispense:  180 tablet    Refill:  1   metFORMIN (GLUCOPHAGE-XR) 500 MG 24 hr tablet    Sig: Take 2 tablets (1,000 mg total) by mouth daily with breakfast.    Dispense:  180 tablet    Refill:  1    Return in about 6 months (around 10/19/2022) for T2DM/HTN.    This visit occurred during the SARS-CoV-2 public health  emergency.  Safety protocols were in place, including screening questions prior to the visit, additional usage of staff PPE, and extensive cleaning of exam room while observing appropriate contact time as indicated for disinfecting solutions.

## 2022-04-21 ENCOUNTER — Encounter (INDEPENDENT_AMBULATORY_CARE_PROVIDER_SITE_OTHER): Payer: Self-pay | Admitting: Ophthalmology

## 2022-04-21 ENCOUNTER — Ambulatory Visit (INDEPENDENT_AMBULATORY_CARE_PROVIDER_SITE_OTHER): Payer: Medicare HMO | Admitting: Ophthalmology

## 2022-04-21 DIAGNOSIS — H35033 Hypertensive retinopathy, bilateral: Secondary | ICD-10-CM | POA: Diagnosis not present

## 2022-04-21 DIAGNOSIS — H43821 Vitreomacular adhesion, right eye: Secondary | ICD-10-CM | POA: Diagnosis not present

## 2022-04-21 DIAGNOSIS — H25813 Combined forms of age-related cataract, bilateral: Secondary | ICD-10-CM

## 2022-04-21 DIAGNOSIS — I10 Essential (primary) hypertension: Secondary | ICD-10-CM | POA: Diagnosis not present

## 2022-04-21 DIAGNOSIS — E113313 Type 2 diabetes mellitus with moderate nonproliferative diabetic retinopathy with macular edema, bilateral: Secondary | ICD-10-CM

## 2022-04-21 LAB — HEMOGLOBIN A1C
Hgb A1c MFr Bld: 7.2 % of total Hgb — ABNORMAL HIGH (ref <5.7)
Mean Plasma Glucose: 160 mg/dL
eAG (mmol/L): 8.9 mmol/L

## 2022-04-21 LAB — CBC WITH DIFFERENTIAL/PLATELET
Absolute Monocytes: 755 cells/uL (ref 200–950)
Basophils Absolute: 29 cells/uL (ref 0–200)
Basophils Relative: 0.3 %
Eosinophils Absolute: 274 cells/uL (ref 15–500)
Eosinophils Relative: 2.8 %
HCT: 37.2 % (ref 35.0–45.0)
Hemoglobin: 12.4 g/dL (ref 11.7–15.5)
Lymphs Abs: 1705 cells/uL (ref 850–3900)
MCH: 29.1 pg (ref 27.0–33.0)
MCHC: 33.3 g/dL (ref 32.0–36.0)
MCV: 87.3 fL (ref 80.0–100.0)
MPV: 9.9 fL (ref 7.5–12.5)
Monocytes Relative: 7.7 %
Neutro Abs: 7036 cells/uL (ref 1500–7800)
Neutrophils Relative %: 71.8 %
Platelets: 327 10*3/uL (ref 140–400)
RBC: 4.26 10*6/uL (ref 3.80–5.10)
RDW: 13 % (ref 11.0–15.0)
Total Lymphocyte: 17.4 %
WBC: 9.8 10*3/uL (ref 3.8–10.8)

## 2022-04-21 LAB — COMPLETE METABOLIC PANEL WITH GFR
AG Ratio: 1.3 (calc) (ref 1.0–2.5)
ALT: 18 U/L (ref 6–29)
AST: 21 U/L (ref 10–35)
Albumin: 4.1 g/dL (ref 3.6–5.1)
Alkaline phosphatase (APISO): 80 U/L (ref 37–153)
BUN: 15 mg/dL (ref 7–25)
CO2: 23 mmol/L (ref 20–32)
Calcium: 9.4 mg/dL (ref 8.6–10.4)
Chloride: 104 mmol/L (ref 98–110)
Creat: 0.75 mg/dL (ref 0.60–1.00)
Globulin: 3.1 g/dL (calc) (ref 1.9–3.7)
Glucose, Bld: 134 mg/dL — ABNORMAL HIGH (ref 65–99)
Potassium: 4.5 mmol/L (ref 3.5–5.3)
Sodium: 139 mmol/L (ref 135–146)
Total Bilirubin: 0.4 mg/dL (ref 0.2–1.2)
Total Protein: 7.2 g/dL (ref 6.1–8.1)
eGFR: 83 mL/min/{1.73_m2} (ref >=60)

## 2022-04-21 LAB — LIPID PANEL W/REFLEX DIRECT LDL
Cholesterol: 104 mg/dL (ref <200)
HDL: 46 mg/dL — ABNORMAL LOW (ref >=50)
LDL Cholesterol (Calc): 40 mg/dL (calc)
Non-HDL Cholesterol (Calc): 58 mg/dL (calc) (ref <130)
Total CHOL/HDL Ratio: 2.3 (calc) (ref <5.0)
Triglycerides: 101 mg/dL (ref <150)

## 2022-04-21 LAB — MICROALBUMIN / CREATININE URINE RATIO
Creatinine, Urine: 100 mg/dL (ref 20–275)
Microalb Creat Ratio: 762 mcg/mg creat — ABNORMAL HIGH (ref <30)
Microalb, Ur: 76.2 mg/dL

## 2022-04-21 LAB — TSH: TSH: 0.83 mIU/L (ref 0.40–4.50)

## 2022-04-21 MED ORDER — FARICIMAB-SVOA 6 MG/0.05ML IZ SOLN
6.0000 mg | INTRAVITREAL | Status: AC | PRN
  Administered 2022-04-21: 6 mg via INTRAVITREAL

## 2022-04-26 ENCOUNTER — Other Ambulatory Visit: Payer: Self-pay | Admitting: Family Medicine

## 2022-05-02 DIAGNOSIS — H2513 Age-related nuclear cataract, bilateral: Secondary | ICD-10-CM | POA: Diagnosis not present

## 2022-05-02 DIAGNOSIS — H40013 Open angle with borderline findings, low risk, bilateral: Secondary | ICD-10-CM | POA: Diagnosis not present

## 2022-05-02 DIAGNOSIS — E113393 Type 2 diabetes mellitus with moderate nonproliferative diabetic retinopathy without macular edema, bilateral: Secondary | ICD-10-CM | POA: Diagnosis not present

## 2022-05-05 ENCOUNTER — Encounter: Payer: Self-pay | Admitting: Podiatrist

## 2022-05-05 ENCOUNTER — Ambulatory Visit (INDEPENDENT_AMBULATORY_CARE_PROVIDER_SITE_OTHER): Payer: Medicare HMO | Admitting: Podiatrist

## 2022-05-05 DIAGNOSIS — Z89619 Acquired absence of unspecified leg above knee: Secondary | ICD-10-CM | POA: Diagnosis not present

## 2022-05-05 DIAGNOSIS — B351 Tinea unguium: Secondary | ICD-10-CM | POA: Diagnosis not present

## 2022-05-05 DIAGNOSIS — E1169 Type 2 diabetes mellitus with other specified complication: Secondary | ICD-10-CM | POA: Diagnosis not present

## 2022-05-05 NOTE — Progress Notes (Signed)
  Subjective:  Patient ID: Erin Mitchell, female    DOB: Jul 28, 1945,   MRN: [id number]  Chief Complaint  Patient presents with   Nail Problem    Routine Foot Care     76 y.o. female presents for painful elongated and discolored nails that are difficult to trim.  Relates she is unable to trim herself. She is diabetic and last A1c was 7.2. 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 Also has a history of PAD with angioplasty. Denies any other pedal complaints. Denies n/v/f/c.   PCP: Everrett Coombe MD   Past Medical History:  Diagnosis Date   Atrial fibrillation (HCC)    on Plavix   Cataract    Diabetes (HCC)    Diabetic retinopathy (HCC)    Exposure to Agent Orange    Hypertension    Hypertensive retinopathy    hypothyroidism    Hypothyroidism    Kidney disease     Objective:  Physical Exam: Vascular: DP/PT pulses 2/4 bilateral. CFT <3 seconds. Absent hair growth on digits. No edema.  Skin. No lacerations or abrasions bilateral feet. Nails 1-5 left and 1, 4, 5 right are thickened discolored and elongated with subungual debris.  Musculoskeletal: MMT 5/5 bilateral lower extremities in DF, PF, Inversion and Eversion. Deceased ROM in DF of ankle joint. Right second and third digits amputated. Severe bunion of right foot, tailor's bunion right foot noted.  Neurological: Sensation intact to light touch.   Assessment:     ICD-10-CM   1. 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 Dermatophytosis of nail  B35.1            Plan:  Patient was evaluated and treated and all questions answered. -Discussed supportive shoes at all times and checking feet regularly.  -Mechanically debrided all nails x 8 bilateral using sterile nail nipper and filed with dremel without incident  -Answered all patient questions -Patient to return  in 3 months for at risk foot care -Patient  advised to call the office if any problems or questions arise in the meantime.

## 2022-05-16 NOTE — Progress Notes (Shared)
[facility] Note  05/19/2022     CHIEF COMPLAINT Patient presents for Retina Follow Up  HISTORY OF PRESENT ILLNESS: Erin Mitchell is a 76 y.o. female who presents to the clinic today for:   HPI     Retina Follow Up   Patient presents with  Diabetic Retinopathy.  In both eyes.  Severity is moderate.  Duration of 4 weeks.  Since onset it is stable.  I, the attending physician,  performed the HPI with the patient and updated documentation appropriately.        Comments   She is complaining of flashes that have happened in the past 3 weeks. She states that she always see floaters. She is using AT's OU PRN- rare. There have been no changes in her general health. Her blood sugar was 133 and her A1c is 7.2.      Last edited by Bernarda Caffey, MD on 05/19/2022 10:44 AM.    Pt had an appt with Dr. Nancy Fetter who wanted Dr. Coralyn Pear to evaluate her retina for cataract sx, she goes back to see her on Monday for measurements, Dr. Nancy Fetter also evaluated her for glaucoma and said she is "borderline", pt has seen auras 3 times in the past couple of weeks, she states drinking black coffee helped them go away  Referring physician: Corey Harold, MD [place],  [place] [address]  HISTORICAL INFORMATION:   Selected notes from the MEDICAL RECORD NUMBER Referred by Dr. Luetta Nutting for DM exam LEE:  Ocular Hx- PMH- DM, HTN, afib, kidney disease, last a1c was 6.8 on 05.05.22    CURRENT MEDICATIONS: No current outpatient medications on file. (Ophthalmic Drugs)   No current facility-administered medications for this visit. (Ophthalmic Drugs)   Current Outpatient Medications (Other)  Medication Sig   Alcohol Swabs (DROPSAFE ALCOHOL PREP) 70 % PADS USE AS DIRECTED.   Blood Glucose Monitoring Suppl (TRUE METRIX METER) w/Device KIT Use as directed to test blood glucose daily   Cholecalciferol (VITAMIN D-1000 MAX ST PO) Take 2,000 Units by mouth daily.    clopidogrel (PLAVIX) 75 MG tablet TAKE 1 TABLET EVERY DAY   doxycycline (VIBRA-TABS) 100 MG tablet Take 1 tablet (100 mg total) by mouth 2 (two) times daily. (Patient not taking: Reported on 04/20/2022)   glucose blood (TRUE METRIX BLOOD GLUCOSE TEST) test strip USE AS INSTRUCTED   loperamide (IMODIUM A-D) 2 MG tablet Take 2-4 mg by mouth 4 (four) times daily as needed for diarrhea or loose stools.   losartan (COZAAR) 100 MG tablet Take 1 tablet (100 mg total) by mouth daily.   Melatonin 10 MG CAPS Take by mouth.   metFORMIN (GLUCOPHAGE-XR) 500 MG 24 hr tablet Take 2 tablets (1,000 mg total) by mouth daily with breakfast.   metoprolol tartrate (LOPRESSOR) 25 MG tablet TAKE 1 TABLET TWICE DAILY WITH FOOD   NP THYROID 90 MG tablet TAKE 1 TABLET (90 MG TOTAL) BY MOUTH DAILY. NO REFILLS. NEEDS LABS.   PAPAYA ENZYME PO Take 2 capsules by mouth daily as needed (heartburn).   Probiotic Product (ALIGN) 4 MG CAPS Take 4 mg by mouth 2 (two) times daily.   rosuvastatin (CRESTOR) 40 MG tablet TAKE 1 TABLET EVERY DAY   TRUEplus Lancets 33G MISC Use as directed daily.   Zinc Sulfate (ZINC 15 PO) Take by mouth.   No current facility-administered medications for this visit. (Other)   REVIEW OF SYSTEMS: ROS   Positive for:  Endocrine, Cardiovascular, Eyes Negative for: Constitutional, Gastrointestinal, Neurological, Skin, Genitourinary, Musculoskeletal, HENT, Respiratory, Psychiatric, Allergic/Imm, Heme/Lymph Last edited by Annie Paras, COT on 05/19/2022  8:43 AM.     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   VENTRAL HERNIA REPAIR N/A 12/28/2020   Procedure: LAPAROSCOPIC VENTRAL Pender;  Surgeon: Armandina Gemma, MD;  Location: [facility];  Service: General;  Laterality: N/A;   FAMILY HISTORY Family History  Problem Relation Age of Onset   Hypertension Father    Diabetes Brother    Emphysema Mother    SOCIAL HISTORY Social History   Tobacco Use   Smoking status: Former    Packs/day: 0.25    Years: 1.00    Total pack years: 0.25    Types: Cigarettes    Quit date: 06/06/1970    Years since quitting: 51.9   Smokeless tobacco: Never  Vaping Use   Vaping Use: Never used  Substance Use Topics   Alcohol use: Never   Drug use: Never       OPHTHALMIC EXAM: Base Eye Exam     Visual Acuity (Snellen - Linear)       Right Left   Dist cc 20/70 20/20   Dist ph cc 20/60     Correction: Glasses         Tonometry (Tonopen, 8:47 AM)       Right Left   Pressure 13 12         Pupils       Dark Light Shape React APD   Right 2 1 Round Minimal None   Left 2 1 Round Minimal None         Visual Fields       Left Right    Full Full         Extraocular Movement       Right Left    Full, Ortho Full, Ortho         Neuro/Psych     Oriented x3: Yes   Mood/Affect: Normal         Dilation     Both eyes: 1.0% Mydriacyl, 2.5% Phenylephrine @ 8:43 AM           Slit Lamp and Fundus Exam     Slit Lamp Exam       Right Left   Lids/Lashes Dermatochalasis - upper lid, Meibomian gland dysfunction Dermatochalasis - upper lid, Meibomian gland dysfunction   Conjunctiva/Sclera White and quiet White and quiet   Cornea arcus, trace PEE, mild tear film debris arcus, trace PEE, mild tear film debris   Anterior Chamber Moderate depth, clear, very narrow temporal angles Moderate depth, clear, very narrow temporal angles   Iris Round and moderately dilated, mild anterior bowing  Round and moderately dilated, mild anterior bowing   Lens 2-3+ Nuclear sclerosis with brunescence, 2-3+ Cortical cataract 2-3+ Nuclear sclerosis  with brunescence, 2-3+ Cortical cataract   Anterior Vitreous Vitreous syneresis, Posterior vitreous detachment Vitreous syneresis, Posterior vitreous detachment, vitreous condensations         Fundus Exam       Right Left   Disc Trace pallor, Sharp rim, +cupping Pink and Sharp, +cupping   C/D Ratio 0.7 0.7   Macula Blunted foveal reflex, central edema, RPE mottling, cluster of MA temporal to fovea and temporal macula -- improving, mild ERM Flat, Blunted foveal reflex, RPE mottling, focal MA temp mac, no exudates   Vessels attenuated, Tortuous attenuated, Tortuous   Periphery Attached, mild reticular degeneration, mild MA Attached, mild reticular degeneration, scattered MA, scattered DBH greatest posteriorly           Refraction     Wearing Rx       Sphere Cylinder Add   Right +1.25 Sphere +2.75   Left +1.50 Sphere +2.75    Type: Bifocal           IMAGING AND PROCEDURES  Imaging and Procedures for 05/19/2022  OCT, Retina - OU - Both Eyes       Right Eye Quality was good. Central Foveal Thickness: 414. Progression has improved. Findings include no SRF, abnormal foveal contour, intraretinal hyper-reflective material, intraretinal fluid, vitreomacular adhesion (Very mild improvement in central cyst and surrounding cystic changes).   Left Eye Quality was good. Central Foveal Thickness: 259. Progression has been stable. Findings include no SRF, abnormal foveal contour, intraretinal fluid, vitreous traction, vitreomacular adhesion (persistent non-central cystic changes temporal macula, persistent central VMT).   Notes *Images captured and stored on drive  Diagnosis / Impression:  OD: +central DME, Very mild improvement in central cyst and surrounding cystic changes OS: persistent non-central cystic changes temporal macula,  persistent central VMT  Clinical management:  See below  Abbreviations: NFP - Normal foveal profile. CME - cystoid macular edema. PED - pigment epithelial detachment. IRF - intraretinal fluid. SRF - subretinal fluid. EZ - ellipsoid zone. ERM - epiretinal membrane. ORA - outer retinal atrophy. ORT - outer retinal tubulation. SRHM - subretinal hyper-reflective material. IRHM - intraretinal hyper-reflective material      Intravitreal Injection, Pharmacologic Agent - OD - Right Eye       Time Out 05/19/2022. 9:24 AM. Confirmed correct patient, procedure, site, and patient consented.   Anesthesia Topical anesthesia was used. Anesthetic medications included Lidocaine 2%, Proparacaine 0.5%.   Procedure Preparation included 5% betadine to ocular surface, eyelid speculum. A (32g) needle was used.   Injection: 6 mg faricimab-svoa 6 MG/0.05ML   Route: Intravitreal, Site: Right Eye   NDC: [phone], Lot: [id number], Expiration date: 06/04/2024, Waste: 0 mL   Post-op Post injection exam found visual acuity of at least counting fingers. The patient tolerated the procedure well. There were no complications. The patient received written and verbal post procedure care education. Post injection medications were not given.            ASSESSMENT/PLAN:    ICD-10-CM   1. Moderate nonproliferative diabetic retinopathy of both eyes with macular edema associated with type 2 diabetes mellitus (HCC)  E11.3313 OCT, Retina - OU - Both Eyes    Intravitreal Injection, Pharmacologic Agent - OD - Right Eye    faricimab-svoa (VABYSMO) 37m/0.05mL intravitreal injection    2. Vitreomacular adhesion of right eye  H43.821     3. Essential hypertension  I10     4. Hypertensive retinopathy of both eyes  H35.033     5. Combined forms of  age-related cataract of both eyes  H25.813     6. Glaucoma suspect of both eyes  H40.003      1. Moderate non-proliferative diabetic retinopathy, both eyes  - s/p IVE  #1 OD (06.22.22)--sample, #2 (09.07.22), #3 (11.14.22), #4 (12.28.22), #5 (02.08.23), IVE #6 (03.15.23), #7 (04.19.23), #8 (05.24.23), #9 (06.28.23), #10 (07.27.23), #11 (08.24.23) -- IVE resistance  - s/p IVV OD #1 (09.21.23), #2 (10.19.23), #3 (11.16.23) - previously seen at [facility] by Dr. Theora Gianotti (Fax [phone]) - history of anti-VEGF injections since 2019 -- last injection IVE OD Feb/Mar 2022 -- q25mointerval - exam w/ scattered MA - OCT shows OD: +central DME, persistent central cyst and surrounding cystic changes; OS: persistent non-central cystic changes temporal macula at 4 weeks - FA (11.14.22) shows OD: Focal hyperflourescence IT to fovea; no NV; OS: No NV - BCVA 20/60 -- stable; OD; 20/20 OS -- stable. - recommend IVV OD #4 today, 12.14.23 -- f/u again in 4-5 wks - pt wishes to proceed - RBA of procedure discussed, questions answered - IVE informed consent obtained and re-signed today, 06.28.23 - IVV informed consent obtained and signed, 09.21.23 - see procedure note - f/u in 4-5 wks  -- DFE/OCT, possible injection(s)  2. VMA/VMT OD  - central VMA/VMT OD may be contributing to central cyst  - monitor  3,4. Hypertensive retinopathy OU - discussed importance of tight BP control - monitor  5. Mixed Cataract OU - The symptoms of cataract, surgical options, and treatments and risks were discussed with patient. - discussed diagnosis and progression - clear from a retina standpoint to proceed with cataract surgery when pt and surgeon are ready - under the expert management of Dr. SNancy Fetter-- next appt 12.18.23 - recommend coordinating care to have cataract surgery ~1-2 wks after anti-VEGF injection, if possible  6. Glaucoma suspect  - C/D 0.7 OU  - IOPs okay  - under the expert management of Dr. SNancy Fetter  Ophthalmic Meds Ordered this visit:  Meds ordered this encounter  Medications   faricimab-svoa (VABYSMO) 655m0.05mL intravitreal injection     Return for  f/u 4-5 weeks, NPDR, Dilated Exam, OCT, Possible Injxn.  There are no Patient Instructions on file for this visit.   This document serves as a record of services personally performed by BrGardiner SleeperMD, PhD. It was created on their behalf by AmSan JettyBrOwens SharkOA an ophthalmic technician. The creation of this record is the provider's dictation and/or activities during the visit.    Electronically signed by: AmSan JettyBrOwens SharkOANew York2.11.2023 10:49 AM   BrGardiner SleeperM.D., Ph.D. Diseases & Surgery of the Retina and Vitreous [facility]I have reviewed the above documentation for accuracy and completeness, and I agree with the above. BrGardiner SleeperM.D., Ph.D. 05/19/22 10:49 AM   Abbreviations: M myopia (nearsighted); A astigmatism; H hyperopia (farsighted); P presbyopia; Mrx spectacle prescription;  CTL contact lenses; OD right eye; OS left eye; OU both eyes  XT exotropia; ET esotropia; PEK punctate epithelial keratitis; PEE punctate epithelial erosions; DES dry eye syndrome; MGD meibomian gland dysfunction; ATs artificial tears; PFAT's preservative free artificial tears; NSSulphuruclear sclerotic cataract; PSC posterior subcapsular cataract; ERM epi-retinal membrane; PVD posterior vitreous detachment; RD retinal detachment; DM diabetes mellitus; DR diabetic retinopathy; NPDR non-proliferative diabetic retinopathy; PDR proliferative diabetic retinopathy; CSME clinically significant macular edema; DME diabetic macular edema; dbh dot blot hemorrhages; CWS cotton wool spot; POAG primary open angle  glaucoma; C/D cup-to-disc ratio; HVF humphrey visual field; GVF goldmann visual field; OCT optical coherence tomography; IOP intraocular pressure; BRVO Branch retinal vein occlusion; CRVO central retinal vein occlusion; CRAO central retinal artery occlusion; BRAO branch retinal artery occlusion; RT retinal tear; SB scleral buckle; PPV pars plana vitrectomy; VH Vitreous hemorrhage; PRP  panretinal laser photocoagulation; IVK intravitreal kenalog; VMT vitreomacular traction; MH Macular hole;  NVD neovascularization of the disc; NVE neovascularization elsewhere; AREDS age related eye disease study; ARMD age related macular degeneration; POAG primary open angle glaucoma; EBMD epithelial/anterior basement membrane dystrophy; ACIOL anterior chamber intraocular lens; IOL intraocular lens; PCIOL posterior chamber intraocular lens; Phaco/IOL phacoemulsification with intraocular lens placement; [place] photorefractive keratectomy; LASIK laser assisted in situ keratomileusis; HTN hypertension; DM diabetes mellitus; COPD chronic obstructive pulmonary disease

## 2022-05-19 ENCOUNTER — Encounter (INDEPENDENT_AMBULATORY_CARE_PROVIDER_SITE_OTHER): Payer: Self-pay | Admitting: Ophthalmology

## 2022-05-19 ENCOUNTER — Ambulatory Visit (INDEPENDENT_AMBULATORY_CARE_PROVIDER_SITE_OTHER): Payer: Medicare HMO | Admitting: Ophthalmology

## 2022-05-19 DIAGNOSIS — H35033 Hypertensive retinopathy, bilateral: Secondary | ICD-10-CM

## 2022-05-19 DIAGNOSIS — H25813 Combined forms of age-related cataract, bilateral: Secondary | ICD-10-CM

## 2022-05-19 DIAGNOSIS — H40003 Preglaucoma, unspecified, bilateral: Secondary | ICD-10-CM | POA: Diagnosis not present

## 2022-05-19 DIAGNOSIS — I10 Essential (primary) hypertension: Secondary | ICD-10-CM | POA: Diagnosis not present

## 2022-05-19 DIAGNOSIS — E113313 Type 2 diabetes mellitus with moderate nonproliferative diabetic retinopathy with macular edema, bilateral: Secondary | ICD-10-CM | POA: Diagnosis not present

## 2022-05-19 DIAGNOSIS — H43821 Vitreomacular adhesion, right eye: Secondary | ICD-10-CM

## 2022-05-19 MED ORDER — FARICIMAB-SVOA 6 MG/0.05ML IZ SOLN
6.0000 mg | INTRAVITREAL | Status: AC | PRN
  Administered 2022-05-19: 6 mg via INTRAVITREAL

## 2022-05-23 DIAGNOSIS — H25013 Cortical age-related cataract, bilateral: Secondary | ICD-10-CM | POA: Diagnosis not present

## 2022-05-23 DIAGNOSIS — H2513 Age-related nuclear cataract, bilateral: Secondary | ICD-10-CM | POA: Diagnosis not present

## 2022-05-23 DIAGNOSIS — H25043 Posterior subcapsular polar age-related cataract, bilateral: Secondary | ICD-10-CM | POA: Diagnosis not present

## 2022-05-23 DIAGNOSIS — H2511 Age-related nuclear cataract, right eye: Secondary | ICD-10-CM | POA: Diagnosis not present

## 2022-05-23 DIAGNOSIS — E113393 Type 2 diabetes mellitus with moderate nonproliferative diabetic retinopathy without macular edema, bilateral: Secondary | ICD-10-CM | POA: Diagnosis not present

## 2022-05-23 DIAGNOSIS — H40013 Open angle with borderline findings, low risk, bilateral: Secondary | ICD-10-CM | POA: Diagnosis not present

## 2022-05-28 ENCOUNTER — Other Ambulatory Visit: Payer: Self-pay | Admitting: Family Medicine

## 2022-06-16 NOTE — Progress Notes (Signed)
[facility] Note  06/23/2022     CHIEF COMPLAINT Patient presents for Retina Follow Up  HISTORY OF PRESENT ILLNESS: Erin Mitchell is a 77 y.o. female who presents to the clinic today for:   HPI     Retina Follow Up   Patient presents with  Diabetic Retinopathy.  In both eyes.  This started 5 weeks ago.  I, the attending physician,  performed the HPI with the patient and updated documentation appropriately.        Comments   Patient here for 5 weeks retina follow up for NPDR OU. Patient states vision doing good. OD foggs up a little more with the cataract. Having cataract surgery scheduled for February 29 OD. No eye pain. Uses AT's prn.      Last edited by Bernarda Caffey, MD on 06/23/2022 12:26 PM.    Pt states vision is stable, she has cataract sx scheduled for OD on 02.29.24   Referring physician: Luetta Nutting, DO [address] [facility] [address] [place],  [place] [address]  HISTORICAL INFORMATION:   Selected notes from the MEDICAL RECORD NUMBER Referred by Dr. Luetta Nutting for DM exam LEE:  Ocular Hx- PMH- DM, HTN, afib, kidney disease, last a1c was 6.8 on 05.05.22    CURRENT MEDICATIONS: No current outpatient medications on file. (Ophthalmic Drugs)   No current facility-administered medications for this visit. (Ophthalmic Drugs)   Current Outpatient Medications (Other)  Medication Sig   Alcohol Swabs (DROPSAFE ALCOHOL PREP) 70 % PADS USE AS DIRECTED.   Blood Glucose Monitoring Suppl (TRUE METRIX METER) w/Device KIT Use as directed to test blood glucose daily   Cholecalciferol (VITAMIN D-1000 MAX ST PO) Take 2,000 Units by mouth daily.   clopidogrel (PLAVIX) 75 MG tablet TAKE 1 TABLET EVERY DAY   glucose blood (TRUE METRIX BLOOD GLUCOSE TEST) test strip USE AS INSTRUCTED   loperamide (IMODIUM A-D) 2 MG tablet Take 2-4 mg by mouth 4 (four) times daily as needed for diarrhea or loose stools.   losartan (COZAAR) 100 MG  tablet Take 1 tablet (100 mg total) by mouth daily.   Melatonin 10 MG CAPS Take by mouth.   metFORMIN (GLUCOPHAGE-XR) 500 MG 24 hr tablet Take 2 tablets (1,000 mg total) by mouth daily with breakfast.   metoprolol tartrate (LOPRESSOR) 25 MG tablet TAKE 1 TABLET TWICE DAILY WITH FOOD   NP THYROID 90 MG tablet TAKE 1 TABLET (90 MG TOTAL) BY MOUTH DAILY. NO REFILLS. NEEDS LABS.   PAPAYA ENZYME PO Take 2 capsules by mouth daily as needed (heartburn).   Probiotic Product (ALIGN) 4 MG CAPS Take 4 mg by mouth 2 (two) times daily.   rosuvastatin (CRESTOR) 40 MG tablet TAKE 1 TABLET EVERY DAY   TRUEplus Lancets 33G MISC Use as directed daily.   Zinc Sulfate (ZINC 15 PO) Take by mouth.   doxycycline (VIBRA-TABS) 100 MG tablet Take 1 tablet (100 mg total) by mouth 2 (two) times daily. (Patient not taking: Reported on 04/20/2022)   No current facility-administered medications for this visit. (Other)   REVIEW OF SYSTEMS: ROS   Positive for: Endocrine, Cardiovascular, Eyes Negative for: Constitutional, Gastrointestinal, Neurological, Skin, Genitourinary, Musculoskeletal, HENT, Respiratory, Psychiatric, Allergic/Imm, Heme/Lymph Last edited by Theodore Demark, COA on 06/23/2022  9:09 AM.     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   VENTRAL HERNIA REPAIR N/A 12/28/2020   Procedure: LAPAROSCOPIC VENTRAL [place];  Surgeon: Armandina Gemma, MD;  Location: [facility];  Service: General;  Laterality: N/A;   FAMILY HISTORY Family  History  Problem Relation Age of Onset   Hypertension Father    Diabetes Brother    Emphysema Mother    SOCIAL HISTORY Social History   Tobacco Use   Smoking status: Former    Packs/day: 0.25    Years: 1.00    Total pack years: 0.25    Types: Cigarettes    Quit date: 06/06/1970    Years since quitting: 52.0   Smokeless tobacco: Never  Vaping Use   Vaping Use: Never used  Substance Use Topics   Alcohol use: Never   Drug use: Never       OPHTHALMIC EXAM: Base Eye Exam     Visual Acuity (Snellen - Linear)       Right Left   Dist cc 20/70 -2 20/20   Dist ph cc 20/60 -1     Correction: Glasses         Tonometry (Tonopen, 9:06 AM)       Right Left   Pressure 09 09         Pupils       Dark Light Shape React APD   Right 2 1 Round Minimal None   Left 2 1 Round Minimal None         Visual Fields (Counting fingers)       Left Right    Full Full         Extraocular Movement       Right Left    Full, Ortho Full, Ortho         Neuro/Psych     Oriented x3: Yes   Mood/Affect: Normal         Dilation     Both eyes: 1.0% Mydriacyl, 2.5% Phenylephrine @ 9:05 AM           Slit Lamp and Fundus Exam     Slit Lamp Exam       Right Left   Lids/Lashes Dermatochalasis - upper lid, Meibomian gland dysfunction Dermatochalasis - upper lid, Meibomian gland dysfunction   Conjunctiva/Sclera White and quiet White and quiet   Cornea arcus, trace PEE, mild tear film debris arcus, trace PEE, mild tear film debris   Anterior Chamber Moderate depth, clear, very narrow temporal angles Moderate depth, clear, very narrow temporal angles   Iris Round and moderately dilated, mild anterior bowing Round and moderately dilated, mild anterior bowing   Lens 2-3+ Nuclear sclerosis with brunescence, 2-3+ Cortical cataract 2-3+ Nuclear sclerosis with brunescence, 2-3+ Cortical cataract   Anterior Vitreous Vitreous syneresis, Posterior vitreous detachment Vitreous  syneresis, Posterior vitreous detachment, vitreous condensations         Fundus Exam       Right Left   Disc Trace pallor, Sharp rim, +cupping Pink and Sharp, +cupping   C/D Ratio 0.7 0.7  Macula Blunted foveal reflex, central edema -- slightly improved, RPE mottling, cluster of MA temporal to fovea and temporal macula -- improving, mild ERM Flat, Blunted foveal reflex, RPE mottling, focal MA temp mac, no exudates, cystic changes nasal macula   Vessels attenuated, Tortuous attenuated, Tortuous   Periphery Attached, mild reticular degeneration, mild MA Attached, mild reticular degeneration, scattered MA, scattered DBH greatest posteriorly           Refraction     Wearing Rx       Sphere Cylinder Add   Right +1.25 Sphere +2.75   Left +1.50 Sphere +2.75    Type: Bifocal           IMAGING AND PROCEDURES  Imaging and Procedures for 06/23/2022  OCT, Retina - OU - Both Eyes       Right Eye Quality was good. Central Foveal Thickness: 396. Progression has improved. Findings include no SRF, abnormal foveal contour, intraretinal hyper-reflective material, intraretinal fluid, vitreomacular adhesion (mild improvement in central cyst and surrounding cystic changes).   Left Eye Quality was good. Central Foveal Thickness: 260. Progression has worsened. Findings include no SRF, abnormal foveal contour, intraretinal fluid, vitreous traction, vitreomacular adhesion (Mild interval increase in cystic changes nasal macula, persistent central VMT).   Notes *Images captured and stored on drive  Diagnosis / Impression:  OD: +central DME, mild improvement in central cyst and surrounding cystic changes; persistent VMT OS: Mild interval increase in cystic changes nasal macula, persistent central VMT  Clinical management:  See below  Abbreviations: NFP - Normal foveal profile. CME - cystoid macular edema. PED - pigment epithelial detachment. IRF - intraretinal fluid. SRF - subretinal  fluid. EZ - ellipsoid zone. ERM - epiretinal membrane. ORA - outer retinal atrophy. ORT - outer retinal tubulation. SRHM - subretinal hyper-reflective material. IRHM - intraretinal hyper-reflective material      Intravitreal Injection, Pharmacologic Agent - OD - Right Eye       Time Out 06/23/2022. 9:46 AM. Confirmed correct patient, procedure, site, and patient consented.   Anesthesia Topical anesthesia was used. Anesthetic medications included Lidocaine 2%, Proparacaine 0.5%.   Procedure Preparation included 5% betadine to ocular surface, eyelid speculum. A (32g) needle was used.   Injection: 6 mg faricimab-svoa 6 MG/0.05ML   Route: Intravitreal, Site: Right Eye   NDC: [phone], Lot: [id number], Expiration date: 06/04/2024, Waste: 0 mL   Post-op Post injection exam found visual acuity of at least counting fingers. The patient tolerated the procedure well. There were no complications. The patient received written and verbal post procedure care education. Post injection medications were not given.            ASSESSMENT/PLAN:    ICD-10-CM   1. Moderate nonproliferative diabetic retinopathy of both eyes with macular edema associated with type 2 diabetes mellitus (HCC)  E11.3313 OCT, Retina - OU - Both Eyes    Intravitreal Injection, Pharmacologic Agent - OD - Right Eye    faricimab-svoa (VABYSMO) 39m/0.05mL intravitreal injection    2. 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 Essential hypertension  I10     4. Hypertensive retinopathy of both eyes  H35.033     5. Combined forms of age-related cataract of both eyes  H25.813     6. Glaucoma suspect of both eyes  H40.003      1. Moderate non-proliferative diabetic retinopathy, both eyes  - s/p IVE OD #1 (06.22.22)--sample, #2 (09.07.22), #3 (11.14.22), #4 (12.28.22), #5 (02.08.23), IVE #6 (03.15.23), #  7 (04.19.23), #8 (05.24.23), #9 (06.28.23), #10 (07.27.23), #11 (08.24.23) -- IVE resistance  - s/p IVV OD #1  (09.21.23), #2 (10.19.23), #3 (11.16.23), #4 (12.14.23) - previously seen at [facility] by Dr. Theora Gianotti (Fax [phone]) - history of anti-VEGF injections since 2019 -- last injection IVE OD Feb/Mar 2022 -- q30mointerval - FA (11.14.22) shows OD: Focal hyperflourescence IT to fovea; no NV; OS: No NV - exam w/ scattered MA - OCT shows OD: +central DME, mild improvement in central cyst and surrounding cystic changes; OS: Mild interval increase in cystic changes nasal macula at 5 weeks - BCVA 20/60 -- stable; OD; 20/20 OS -- stable. - recommend IVV OD #5 today, 01.18.24 -- f/u again in 5 wks - pt wishes to proceed - RBA of procedure discussed, questions answered - IVE informed consent obtained and re-signed today, 06.28.23 - IVV informed consent obtained and signed, 09.21.23 - see procedure note - f/u in 5 wks  -- DFE/OCT, possible injection  2. VMA/VMT OU  - central VMA/VMT OD may be contributing to central cyst  - OS stable  - monitor  3,4. Hypertensive retinopathy OU - discussed importance of tight BP control - monitor  5. Mixed Cataract OU - The symptoms of cataract, surgical options, and treatments and risks were discussed with patient. - discussed diagnosis and progression - now under the expert management of Dr. SNancy Fetter- cataract surgery scheduled for Aug 04 2022 - clear from a retina standpoint to proceed with cataract surgery when pt and surgeon are ready  6. Glaucoma suspect  - C/D 0.7 OU  - IOPs okay  - under the expert management of Dr. SNancy Fetter  Ophthalmic Meds Ordered this visit:  Meds ordered this encounter  Medications   faricimab-svoa (VABYSMO) 698m0.05mL intravitreal injection     Return in about 5 weeks (around 07/28/2022) for f/u NPDR OU, DFE, OCT.  There are no Patient Instructions on file for this visit.  This document serves as a record of services personally performed by BrGardiner SleeperMD, PhD. It was created on their behalf by AmSan Jetty BrOwens SharkOA an ophthalmic technician. The creation of this record is the provider's dictation and/or activities during the visit.    Electronically signed by: AmSan JettyBrMarguerita Merles1.11.2024 12:28 PM  BrGardiner SleeperM.D., Ph.D. Diseases & Surgery of the Retina and Vitreous [facility] have reviewed the above documentation for accuracy and completeness, and I agree with the above. BrGardiner SleeperM.D., Ph.D. 06/23/22 12:31 PM  Abbreviations: M myopia (nearsighted); A astigmatism; H hyperopia (farsighted); P presbyopia; Mrx spectacle prescription;  CTL contact lenses; OD right eye; OS left eye; OU both eyes  XT exotropia; ET esotropia; PEK punctate epithelial keratitis; PEE punctate epithelial erosions; DES dry eye syndrome; MGD meibomian gland dysfunction; ATs artificial tears; PFAT's preservative free artificial tears; NSAnsleyuclear sclerotic cataract; PSC posterior subcapsular cataract; ERM epi-retinal membrane; PVD posterior vitreous detachment; RD retinal detachment; DM diabetes mellitus; DR diabetic retinopathy; NPDR non-proliferative diabetic retinopathy; PDR proliferative diabetic retinopathy; CSME clinically significant macular edema; DME diabetic macular edema; dbh dot blot hemorrhages; CWS cotton wool spot; POAG primary open angle glaucoma; C/D cup-to-disc ratio; HVF humphrey visual field; GVF goldmann visual field; OCT optical coherence tomography; IOP intraocular pressure; BRVO Branch retinal vein occlusion; CRVO central retinal vein occlusion; CRAO central retinal artery occlusion; BRAO branch retinal artery occlusion; RT retinal tear; SB scleral buckle; PPV pars plana vitrectomy; VH  Vitreous hemorrhage; PRP panretinal laser photocoagulation; IVK intravitreal kenalog; VMT vitreomacular traction; MH Macular hole;  NVD neovascularization of the disc; NVE neovascularization elsewhere; AREDS age related eye disease study; ARMD age related macular degeneration; POAG primary  open angle glaucoma; EBMD epithelial/anterior basement membrane dystrophy; ACIOL anterior chamber intraocular lens; IOL intraocular lens; PCIOL posterior chamber intraocular lens; Phaco/IOL phacoemulsification with intraocular lens placement; [place] photorefractive keratectomy; LASIK laser assisted in situ keratomileusis; HTN hypertension; DM diabetes mellitus; COPD chronic obstructive pulmonary disease

## 2022-06-23 ENCOUNTER — Ambulatory Visit (INDEPENDENT_AMBULATORY_CARE_PROVIDER_SITE_OTHER): Payer: Medicare HMO | Admitting: Ophthalmology

## 2022-06-23 ENCOUNTER — Encounter (INDEPENDENT_AMBULATORY_CARE_PROVIDER_SITE_OTHER): Payer: Self-pay | Admitting: Ophthalmology

## 2022-06-23 DIAGNOSIS — H43823 Vitreomacular adhesion, bilateral: Secondary | ICD-10-CM

## 2022-06-23 DIAGNOSIS — H35033 Hypertensive retinopathy, bilateral: Secondary | ICD-10-CM

## 2022-06-23 DIAGNOSIS — H25813 Combined forms of age-related cataract, bilateral: Secondary | ICD-10-CM

## 2022-06-23 DIAGNOSIS — H40003 Preglaucoma, unspecified, bilateral: Secondary | ICD-10-CM | POA: Diagnosis not present

## 2022-06-23 DIAGNOSIS — I10 Essential (primary) hypertension: Secondary | ICD-10-CM | POA: Diagnosis not present

## 2022-06-23 DIAGNOSIS — H43821 Vitreomacular adhesion, right eye: Secondary | ICD-10-CM

## 2022-06-23 DIAGNOSIS — E113313 Type 2 diabetes mellitus with moderate nonproliferative diabetic retinopathy with macular edema, bilateral: Secondary | ICD-10-CM

## 2022-06-23 MED ORDER — FARICIMAB-SVOA 6 MG/0.05ML IZ SOLN
6.0000 mg | INTRAVITREAL | Status: AC | PRN
  Administered 2022-06-23: 6 mg via INTRAVITREAL

## 2022-07-04 ENCOUNTER — Encounter: Payer: Self-pay | Admitting: Cardiology

## 2022-07-04 ENCOUNTER — Ambulatory Visit: Payer: Medicare HMO | Admitting: Cardiology

## 2022-07-04 VITALS — BP 148/83 | HR 73 | Ht 64.0 in | Wt 159.1 lb

## 2022-07-04 DIAGNOSIS — I10 Essential (primary) hypertension: Secondary | ICD-10-CM | POA: Diagnosis not present

## 2022-07-04 DIAGNOSIS — I480 Paroxysmal atrial fibrillation: Secondary | ICD-10-CM

## 2022-07-04 DIAGNOSIS — R011 Cardiac murmur, unspecified: Secondary | ICD-10-CM | POA: Diagnosis not present

## 2022-07-04 DIAGNOSIS — E785 Hyperlipidemia, unspecified: Secondary | ICD-10-CM | POA: Diagnosis not present

## 2022-07-04 DIAGNOSIS — I2510 Atherosclerotic heart disease of native coronary artery without angina pectoris: Secondary | ICD-10-CM

## 2022-07-04 DIAGNOSIS — E1169 Type 2 diabetes mellitus with other specified complication: Secondary | ICD-10-CM | POA: Diagnosis not present

## 2022-07-04 NOTE — Patient Instructions (Signed)
  Testing/Procedures:  Your physician has requested that you have an echocardiogram. Echocardiography is a painless test that uses sound waves to create images of your heart. It provides your doctor with information about the size and shape of your heart and how well your heart's chambers and valves are working. This procedure takes approximately one hour. There are no restrictions for this procedure. Please do NOT wear cologne, perfume, aftershave, or lotions (deodorant is allowed). Please arrive 15 minutes prior to your appointment time. [facility] FLOOR IMAGING DEPARTMENT   Follow-Up: At [facility], you and your health needs are our priority.  As part of our continuing mission to provide you with exceptional heart care, we have created designated Provider Care Teams.  These Care Teams include your primary Cardiologist (physician) and Advanced Practice Providers (APPs -  Physician Assistants and Nurse Practitioners) who all work together to provide you with the care you need, when you need it.  We recommend signing up for the patient portal called "MyChart".  Sign up information is provided on this After Visit Summary.  MyChart is used to connect with patients for Virtual Visits (Telemedicine).  Patients are able to view lab/test results, encounter notes, upcoming appointments, etc.  Non-urgent messages can be sent to your provider as well.   To learn more about what you can do with MyChart, go to [email or web address].    Your next appointment:   12 month(s)  Provider:   Kirk Ruths, MD

## 2022-07-04 NOTE — Progress Notes (Signed)
HPI: FU atrial fibrillation, diabetes mellitus, hypertension, chronic stage III kidney disease, peripheral vascular disease.  Previously cared for in [place].  She apparently had a bout of atrial fibrillation in the setting of osteomyelitis in the past.  She states she was not anticoagulated and had outpatient Holter that was unremarkable. Nuclear study 6/22 showed EF 69, apical infarct, no ischemia. Echo 6/22 showed normal LV function, mild LVH, grade 1 DD.  ABIs June 2023 noncompressible with abnormal left toe to brachial index.  Since she was last seen, she denies dyspnea, chest pain, palpitations or syncope.  Current Outpatient Medications  Medication Sig Dispense Refill   Alcohol Swabs (DROPSAFE ALCOHOL PREP) 70 % PADS USE AS DIRECTED. 300 each 2   Blood Glucose Monitoring Suppl (TRUE METRIX METER) w/Device KIT Use as directed to test blood glucose daily 1 kit 0   Cholecalciferol (VITAMIN D-1000 MAX ST PO) Take 2,000 Units by mouth daily.     clopidogrel (PLAVIX) 75 MG tablet TAKE 1 TABLET EVERY DAY 90 tablet 3   doxycycline (VIBRA-TABS) 100 MG tablet Take 1 tablet (100 mg total) by mouth 2 (two) times daily. (Patient not taking: Reported on 04/20/2022) 14 tablet 0   glucose blood (TRUE METRIX BLOOD GLUCOSE TEST) test strip USE AS INSTRUCTED 100 strip 2   loperamide (IMODIUM A-D) 2 MG tablet Take 2-4 mg by mouth 4 (four) times daily as needed for diarrhea or loose stools.     losartan (COZAAR) 100 MG tablet Take 1 tablet (100 mg total) by mouth daily. 90 tablet 1   Melatonin 10 MG CAPS Take by mouth.     metFORMIN (GLUCOPHAGE-XR) 500 MG 24 hr tablet Take 2 tablets (1,000 mg total) by mouth daily with breakfast. 180 tablet 1   metoprolol tartrate (LOPRESSOR) 25 MG tablet TAKE 1 TABLET TWICE DAILY WITH FOOD 180 tablet 1   NP THYROID 90 MG tablet TAKE 1 TABLET (90 MG TOTAL) BY MOUTH DAILY. NO REFILLS. NEEDS LABS. 90 tablet 1   PAPAYA ENZYME PO Take 2 capsules by mouth daily as needed  (heartburn).     Probiotic Product (ALIGN) 4 MG CAPS Take 4 mg by mouth 2 (two) times daily.     rosuvastatin (CRESTOR) 40 MG tablet TAKE 1 TABLET EVERY DAY 90 tablet 3   TRUEplus Lancets 33G MISC Use as directed daily. 100 each 3   Zinc Sulfate (ZINC 15 PO) Take by mouth.     No current facility-administered medications for this visit.     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 & 3rd   VENTRAL HERNIA REPAIR N/A 12/28/2020   Procedure: LAPAROSCOPIC VENTRAL [place];  Surgeon: Armandina Gemma, MD;  Location: [facility];  Service: General;  Laterality: N/A;    Social History   Socioeconomic History   Marital status: Married    Spouse name: Not on file   Number of children: 7   Years  of education: Not on file   Highest education level: Not on file  Occupational History   Occupation: Retired  Tobacco Use   Smoking status: Former    Packs/day: 0.25    Years: 1.00    Total pack years: 0.25    Types: Cigarettes    Quit date: 06/06/1970    Years since quitting: 52.1   Smokeless tobacco: Never  Vaping Use   Vaping Use: Never used  Substance and Sexual Activity   Alcohol use: Never   Drug use: Never   Sexual activity: Not Currently    Partners: Male  Other Topics Concern   Not on file  Social History Narrative   Not on file   Social Determinants of Health   Financial Resource Strain: Not on file  Food Insecurity: Not on file  Transportation Needs: Not on file  Physical Activity: Not on file  Stress: Not on file  Social Connections: Not on file  Intimate  Partner Violence: Not on file    Family History  Problem Relation Age of Onset   Hypertension Father    Diabetes Brother    Emphysema Mother     ROS: no fevers or chills, productive cough, hemoptysis, dysphasia, odynophagia, melena, hematochezia, dysuria, hematuria, rash, seizure activity, orthopnea, PND, pedal edema, claudication. Remaining systems are negative.  Physical Exam: Well-developed well-nourished in no acute distress.  Skin is warm and dry.  HEENT is normal.  Neck is supple.  Chest is clear to auscultation with normal expansion.  Cardiovascular exam is regular rate and rhythm.  2/6 systolic murmur apex. Abdominal exam nontender or distended. No masses palpated. Extremities show no edema. neuro grossly intact  ECG-normal sinus rhythm with first-degree AV block, nonspecific ST changes.  Personally reviewed  A/P  1 atrial fibrillation-patient had an isolated episode in the past in the setting of osteomyelitis by report.  She has had no recurrences and is therefore not anticoagulated.  2 coronary artery disease-this is based on previous nuclear study showing infarct but no ischemia.  Continue Plavix and statin.  She is not having chest pain.  3 hypertension-blood pressure elevated; I have asked her to follow this and we will add additional medications as needed.  4 hyperlipidemia-continue statin.  5 peripheral vascular disease-continue Plavix and statin.  She denies claudication.  6 murmur-patient sounds to have a mitral regurgitation murmur.  Will repeat echocardiogram.  Kirk Ruths, MD

## 2022-07-20 ENCOUNTER — Ambulatory Visit (HOSPITAL_BASED_OUTPATIENT_CLINIC_OR_DEPARTMENT_OTHER)
Admission: RE | Admit: 2022-07-20 | Discharge: 2022-07-20 | Disposition: A | Discharge status: Home / Self Care | Payer: Medicare HMO | Source: Ambulatory Visit | Attending: Cardiology | Admitting: Cardiology

## 2022-07-20 DIAGNOSIS — R011 Cardiac murmur, unspecified: Secondary | ICD-10-CM | POA: Insufficient documentation

## 2022-07-20 LAB — ECHOCARDIOGRAM COMPLETE
AR max vel: 1.12 cm2
AV Area VTI: 1.24 cm2
AV Area mean vel: 1.21 cm2
AV Mean grad: 12.3 mmHg
AV Peak grad: 21.2 mmHg
Ao pk vel: 2.3 m/s
Area-P 1/2: 2.9 cm2
P 1/2 time: 596 msec
S' Lateral: 2.6 cm

## 2022-07-21 ENCOUNTER — Other Ambulatory Visit: Payer: Self-pay | Admitting: *Deleted

## 2022-07-21 ENCOUNTER — Encounter: Payer: Self-pay | Admitting: *Deleted

## 2022-07-21 DIAGNOSIS — I359 Nonrheumatic aortic valve disorder, unspecified: Secondary | ICD-10-CM

## 2022-07-22 NOTE — Progress Notes (Signed)
[facility] Note  07/27/2022     CHIEF COMPLAINT Patient presents for Retina Follow Up  HISTORY OF PRESENT ILLNESS: Erin Mitchell is a 77 y.o. female who presents to the clinic today for:   HPI     Retina Follow Up   Patient presents with  Diabetic Retinopathy.  In both eyes.  This started months ago.  Duration of 5 weeks.  I, the attending physician,  performed the HPI with the patient and updated documentation appropriately.        Comments   Patient states that about 2 weeks ago she had a few episodes of double vision. She is also experiencing flashes of light. Her blood sugar was 126.      Last edited by Bernarda Caffey, MD on 07/27/2022  5:36 PM.    Pt states she had 2 episodes of double vision that only lasted for a couple seconds over the past couple of weeks, pt has right eye cataract sx scheduled for next Thursday  Referring physician: Corey Harold, MD [place],  [place] [address]  HISTORICAL INFORMATION:   Selected notes from the MEDICAL RECORD NUMBER Referred by Dr. Luetta Nutting for DM exam LEE:  Ocular Hx- PMH- DM, HTN, afib, kidney disease, last a1c was 6.8 on 05.05.22    CURRENT MEDICATIONS: No current outpatient medications on file. (Ophthalmic Drugs)   No current facility-administered medications for this visit. (Ophthalmic Drugs)   Current Outpatient Medications (Other)  Medication Sig   Alcohol Swabs (DROPSAFE ALCOHOL PREP) 70 % PADS USE AS DIRECTED.   Blood Glucose Monitoring Suppl (TRUE METRIX METER) w/Device KIT Use as directed to test blood glucose daily   Cholecalciferol (VITAMIN D-1000 MAX ST PO) Take 2,000 Units by mouth daily.   clopidogrel (PLAVIX) 75 MG tablet TAKE 1 TABLET EVERY DAY   glucose blood (TRUE METRIX BLOOD GLUCOSE TEST) test strip USE AS INSTRUCTED   loperamide (IMODIUM A-D) 2 MG tablet Take 2-4 mg by mouth 4 (four) times daily as needed for diarrhea or loose stools.   losartan  (COZAAR) 100 MG tablet Take 1 tablet (100 mg total) by mouth daily.   Melatonin 10 MG CAPS Take 10 mg by mouth at bedtime.   metFORMIN (GLUCOPHAGE-XR) 500 MG 24 hr tablet Take 2 tablets (1,000 mg total) by mouth daily with breakfast. (Patient taking differently: Take 2,000 mg by mouth as directed. 1000 mg in the morning and 1000 mg at night)   metoprolol tartrate (LOPRESSOR) 25 MG tablet TAKE 1 TABLET TWICE DAILY WITH FOOD   NP THYROID 90 MG tablet TAKE 1 TABLET (90 MG TOTAL) BY MOUTH DAILY. NO REFILLS. NEEDS LABS.   Probiotic Product (ALIGN) 4 MG CAPS Take 4 mg by mouth daily.   rosuvastatin (CRESTOR) 40 MG tablet TAKE 1 TABLET EVERY DAY   TRUEplus Lancets 33G MISC Use as directed daily.   Zinc Sulfate (ZINC 15 PO) Take by mouth.   No current facility-administered medications for this visit. (Other)   REVIEW OF SYSTEMS: ROS   Positive for: Endocrine, Cardiovascular, Eyes Negative for: Constitutional, Gastrointestinal, Neurological, Skin, Genitourinary, Musculoskeletal, HENT, Respiratory, Psychiatric, Allergic/Imm, Heme/Lymph Last edited by Annie Paras, COT on 07/27/2022  8:47 AM.      ALLERGIES No Known Allergies  PAST MEDICAL HISTORY Past Medical History:  Diagnosis Date   Atrial fibrillation ([place])    on Plavix   Cataract    Diabetes ([place])    Diabetic retinopathy (  [place])    Exposure to Agent Orange    Hypertension    Hypertensive retinopathy    hypothyroidism    Hypothyroidism    Kidney disease    Past Surgical History:  Procedure Laterality Date   APPENDECTOMY     CESAREAN SECTION     CHOLECYSTECTOMY     HERNIA REPAIR     TOE AMPUTATION Right    2nd & 3rd   VENTRAL HERNIA REPAIR N/A 12/28/2020   Procedure: LAPAROSCOPIC VENTRAL INCISIONAL HERNIA REPAIR WITH MESH;  Surgeon: Armandina Gemma, MD;  Location: [facility];  Service: General;  Laterality: N/A;   FAMILY HISTORY Family History  Problem Relation Age of Onset   Hypertension Father    Diabetes Brother     Emphysema Mother    SOCIAL HISTORY Social History   Tobacco Use   Smoking status: Former    Packs/day: 0.25    Years: 1.00    Total pack years: 0.25    Types: Cigarettes    Quit date: 06/06/1970    Years since quitting: 52.1   Smokeless tobacco: Never  Vaping Use   Vaping Use: Never used  Substance Use Topics   Alcohol use: Never   Drug use: Never       OPHTHALMIC EXAM: Base Eye Exam     Visual Acuity (Snellen - Linear)       Right Left   Dist cc 20/70 20/20   Dist ph cc 20/60     Correction: Glasses         Tonometry (Tonopen, 8:51 AM)       Right Left   Pressure 15 15         Pupils       Dark Light Shape React APD   Right 2 1 Round Brisk None   Left 2 1 Round Brisk None         Visual Fields       Left Right    Full Full         Extraocular Movement       Right Left    Full, Ortho Full, Ortho         Neuro/Psych     Oriented x3: Yes   Mood/Affect: Normal         Dilation     Both eyes: 1.0% Mydriacyl, 2.5% Phenylephrine @ 8:49 AM           Slit Lamp and Fundus Exam     Slit Lamp Exam       Right Left   Lids/Lashes Dermatochalasis - upper lid, Meibomian gland dysfunction Dermatochalasis - upper lid, Meibomian gland dysfunction   Conjunctiva/Sclera White and quiet White and quiet   Cornea arcus, trace PEE, mild tear film debris arcus, trace PEE, mild tear film debris   Anterior Chamber Moderate depth, clear, very narrow temporal angles Moderate depth, clear, very narrow temporal angles   Iris Round and moderately dilated, mild anterior bowing Round and moderately dilated, mild anterior bowing   Lens 3+ Nuclear sclerosis with brunescence, 3+ Cortical cataract 2-3+ Nuclear sclerosis with brunescence, 2-3+ Cortical cataract   Anterior Vitreous Vitreous syneresis, Posterior vitreous detachment, trace VH settled inferiorly Vitreous syneresis, Posterior vitreous detachment, vitreous condensations         Fundus Exam        Right Left   Disc Trace pallor, Sharp rim, +cupping Pink and Sharp, +cupping   C/D Ratio 0.7 0.65   Macula Blunted foveal reflex, central edema --  persistent, RPE mottling, fine drusen, pigment clumping, cluster of MA temporal to fovea and temporal macula -- improving, mild ERM Flat, Blunted foveal reflex, RPE mottling, focal MA temp mac, no exudates, cystic changes nasal macula -- improved   Vessels attenuated, Tortuous attenuated, Tortuous   Periphery Attached, mild reticular degeneration, mild MA Attached, mild reticular degeneration, scattered MA, scattered DBH greatest posteriorly           Refraction     Wearing Rx       Sphere Cylinder Add   Right +1.25 Sphere +2.75   Left +1.50 Sphere +2.75    Type: Bifocal           IMAGING AND PROCEDURES  Imaging and Procedures for 07/27/2022  OCT, Retina - OU - Both Eyes       Right Eye Quality was good. Central Foveal Thickness: 406. Progression has been stable. Findings include no SRF, abnormal foveal contour, intraretinal hyper-reflective material, intraretinal fluid, vitreomacular adhesion (Persistent central cyst and surrounding cystic changes).   Left Eye Quality was good. Central Foveal Thickness: 264. Progression has improved. Findings include no SRF, abnormal foveal contour, intraretinal fluid, vitreous traction, vitreomacular adhesion (Mild interval improvement in cystic changes nasal macula, persistent central VMT).   Notes *Images captured and stored on drive  Diagnosis / Impression:  OD: +central DME, persistent central cyst and surrounding cystic changes -- no significant change; persistent VMT OS: Mild interval improvement in cystic changes nasal macula, persistent mild central VMT  Clinical management:  See below  Abbreviations: NFP - Normal foveal profile. CME - cystoid macular edema. PED - pigment epithelial detachment. IRF - intraretinal fluid. SRF - subretinal fluid. EZ - ellipsoid zone. ERM - epiretinal  membrane. ORA - outer retinal atrophy. ORT - outer retinal tubulation. SRHM - subretinal hyper-reflective material. IRHM - intraretinal hyper-reflective material      Intravitreal Injection, Pharmacologic Agent - OD - Right Eye       Time Out 07/27/2022. 9:28 AM. Confirmed correct patient, procedure, site, and patient consented.   Anesthesia Topical anesthesia was used. Anesthetic medications included Lidocaine 2%, Proparacaine 0.5%.   Procedure Preparation included 5% betadine to ocular surface, eyelid speculum. A (32g) needle was used.   Injection: 6 mg faricimab-svoa 6 MG/0.05ML   Route: Intravitreal, Site: Right Eye   NDC: [id number], Lot: [id number], Expiration date: 07/06/2024, Waste: 0 mL   Post-op Post injection exam found visual acuity of at least counting fingers. The patient tolerated the procedure well. There were no complications. The patient received written and verbal post procedure care education. Post injection medications were not given.            ASSESSMENT/PLAN:    ICD-10-CM   1. Moderate nonproliferative diabetic retinopathy of both eyes with macular edema associated with type 2 diabetes mellitus (HCC)  E11.3313 OCT, Retina - OU - Both Eyes    Intravitreal Injection, Pharmacologic Agent - OD - Right Eye    faricimab-svoa (VABYSMO) '6mg'$ /0.62m intravitreal injection    2. 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 Essential hypertension  I10     4. Hypertensive retinopathy of both eyes  H35.033     5. Combined forms of age-related cataract of both eyes  H25.813     6. Glaucoma suspect of both eyes  H40.003      1. Moderate non-proliferative diabetic retinopathy, both eyes  - s/p IVE OD #1 (06.22.22)--sample, #2 (09.07.22), #3 (11.14.22), #4 (12.28.22), #5 (02.08.23), IVE #6 (03.15.23), #  7 (04.19.23), #8 (05.24.23), #9 (06.28.23), #10 (07.27.23), #11 (08.24.23) -- IVE resistance  - s/p IVV OD #1 (09.21.23), #2 (10.19.23), #3 (11.16.23), #4  (12.14.23), #5 (01.18.24) - previously seen at [facility] by Dr. Theora Gianotti (Fax [phone]) - history of anti-VEGF injections since 2019 -- last injection IVE OD Feb/Mar 2022 -- q15mointerval - FA (11.14.22) shows OD: Focal hyperflourescence IT to fovea; no NV; OS: No NV - exam w/ scattered MA - OCT shows OD: +central DME, persistent central cyst and surrounding cystic changes -- no significant change; persistent VMT; OS: Mild interval improvement in cystic changes nasal macula, persistent mild central VMT at 5 weeks - BCVA 20/60 -- stable; OD; 20/20 OS -- stable. - recommend IVV OD #6 today, 02.21.24 -- f/u in 5 wks again - pt wishes to proceed - RBA of procedure discussed, questions answered - IVE informed consent obtained and re-signed today, 06.28.23 - IVV informed consent obtained and signed, 09.21.23 - see procedure note - f/u in 5 wks  -- DFE/OCT, possible injection  2. VMA/VMT OU  - central VMA/VMT OD may be contributing to central cyst  - OS stable  - monitor  3,4. Hypertensive retinopathy OU - discussed importance of tight BP control - monitor  5. Mixed Cataract OU - The symptoms of cataract, surgical options, and treatments and risks were discussed with patient. - discussed diagnosis and progression - now under the expert management of Dr. SNancy Fetter- cataract surgery OD scheduled for Aug 04 2022 - clear from a retina standpoint to proceed with cataract surgery when pt and surgeon are ready  6. Glaucoma suspect  - C/D 0.7 OU  - IOPs okay  - under the expert management of Dr. SNancy Fetter  Ophthalmic Meds Ordered this visit:  Meds ordered this encounter  Medications   faricimab-svoa (VABYSMO) '6mg'$ /0.023mintravitreal injection     Return in about 5 weeks (around 08/31/2022) for f/u NPDR OU, DFE, OCT.  There are no Patient Instructions on file for this visit.  This document serves as a record of services personally performed by BrGardiner SleeperMD, PhD. It was  created on their behalf by MaOrvan Falconeran ophthalmic technician. The creation of this record is the provider's dictation and/or activities during the visit.    Electronically signed by: MaOrvan FalconerOA, 07/29/22  1:50 AM  This document serves as a record of services personally performed by BrGardiner SleeperMD, PhD. It was created on their behalf by AmSan JettyBrOwens SharkOA an ophthalmic technician. The creation of this record is the provider's dictation and/or activities during the visit.    Electronically signed by: AmSan JettyBrOwens SharkOANew York2.21.2024 1:50 AM  BrGardiner SleeperM.D., Ph.D. Diseases & Surgery of the Retina and Vitreous [facility] have reviewed the above documentation for accuracy and completeness, and I agree with the above. BrGardiner SleeperM.D., Ph.D. 07/29/22 1:51 AM   Abbreviations: M myopia (nearsighted); A astigmatism; H hyperopia (farsighted); P presbyopia; Mrx spectacle prescription;  CTL contact lenses; OD right eye; OS left eye; OU both eyes  XT exotropia; ET esotropia; PEK punctate epithelial keratitis; PEE punctate epithelial erosions; DES dry eye syndrome; MGD meibomian gland dysfunction; ATs artificial tears; PFAT's preservative free artificial tears; NSHarmonyuclear sclerotic cataract; PSC posterior subcapsular cataract; ERM epi-retinal membrane; PVD posterior vitreous detachment; RD retinal detachment; DM diabetes mellitus; DR diabetic retinopathy; NPDR non-proliferative diabetic retinopathy; PDR proliferative diabetic retinopathy; CSME clinically significant  macular edema; DME diabetic macular edema; dbh dot blot hemorrhages; CWS cotton wool spot; POAG primary open angle glaucoma; C/D cup-to-disc ratio; HVF humphrey visual field; GVF goldmann visual field; OCT optical coherence tomography; IOP intraocular pressure; BRVO Branch retinal vein occlusion; CRVO central retinal vein occlusion; CRAO central retinal artery occlusion; BRAO branch retinal  artery occlusion; RT retinal tear; SB scleral buckle; PPV pars plana vitrectomy; VH Vitreous hemorrhage; PRP panretinal laser photocoagulation; IVK intravitreal kenalog; VMT vitreomacular traction; MH Macular hole;  NVD neovascularization of the disc; NVE neovascularization elsewhere; AREDS age related eye disease study; ARMD age related macular degeneration; POAG primary open angle glaucoma; EBMD epithelial/anterior basement membrane dystrophy; ACIOL anterior chamber intraocular lens; IOL intraocular lens; PCIOL posterior chamber intraocular lens; Phaco/IOL phacoemulsification with intraocular lens placement; [place] photorefractive keratectomy; LASIK laser assisted in situ keratomileusis; HTN hypertension; DM diabetes mellitus; COPD chronic obstructive pulmonary disease

## 2022-07-27 ENCOUNTER — Encounter (INDEPENDENT_AMBULATORY_CARE_PROVIDER_SITE_OTHER): Payer: Self-pay | Admitting: Ophthalmology

## 2022-07-27 ENCOUNTER — Ambulatory Visit (INDEPENDENT_AMBULATORY_CARE_PROVIDER_SITE_OTHER): Payer: Medicare HMO | Admitting: Ophthalmology

## 2022-07-27 DIAGNOSIS — H40003 Preglaucoma, unspecified, bilateral: Secondary | ICD-10-CM | POA: Diagnosis not present

## 2022-07-27 DIAGNOSIS — E113313 Type 2 diabetes mellitus with moderate nonproliferative diabetic retinopathy with macular edema, bilateral: Secondary | ICD-10-CM

## 2022-07-27 DIAGNOSIS — H25813 Combined forms of age-related cataract, bilateral: Secondary | ICD-10-CM

## 2022-07-27 DIAGNOSIS — H35033 Hypertensive retinopathy, bilateral: Secondary | ICD-10-CM

## 2022-07-27 DIAGNOSIS — I10 Essential (primary) hypertension: Secondary | ICD-10-CM

## 2022-07-27 DIAGNOSIS — H43823 Vitreomacular adhesion, bilateral: Secondary | ICD-10-CM | POA: Diagnosis not present

## 2022-07-27 MED ORDER — FARICIMAB-SVOA 6 MG/0.05ML IZ SOLN
6.0000 mg | INTRAVITREAL | Status: AC | PRN
  Administered 2022-07-27: 6 mg via INTRAVITREAL

## 2022-07-28 ENCOUNTER — Ambulatory Visit (INDEPENDENT_AMBULATORY_CARE_PROVIDER_SITE_OTHER): Payer: Medicare HMO | Admitting: Podiatry

## 2022-07-28 ENCOUNTER — Encounter: Payer: Self-pay | Admitting: Podiatry

## 2022-07-28 DIAGNOSIS — M79675 Pain in left toe(s): Secondary | ICD-10-CM

## 2022-07-28 DIAGNOSIS — E1152 Type 2 diabetes mellitus with diabetic peripheral angiopathy with gangrene: Secondary | ICD-10-CM

## 2022-07-28 DIAGNOSIS — B351 Tinea unguium: Secondary | ICD-10-CM | POA: Diagnosis not present

## 2022-07-28 DIAGNOSIS — S90421A Blister (nonthermal), right great toe, initial encounter: Secondary | ICD-10-CM

## 2022-07-28 DIAGNOSIS — I739 Peripheral vascular disease, unspecified: Secondary | ICD-10-CM

## 2022-07-28 DIAGNOSIS — Z89429 Acquired absence of other toe(s), unspecified side: Secondary | ICD-10-CM

## 2022-07-28 DIAGNOSIS — M79674 Pain in right toe(s): Secondary | ICD-10-CM | POA: Diagnosis not present

## 2022-07-28 NOTE — Progress Notes (Signed)
  Subjective:  Patient ID: Erin Mitchell, female    DOB: Mar 09, 1946,   MRN: [id number]  Chief Complaint  Patient presents with   Nail Problem     Routine foot care     77 y.o. female presents for painful elongated and discolored nails that are difficult to trim.  Relates she is unable to trim herself. She is diabetic and last A1c was 6.7. 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 Also has a history of PAD . Denies any other pedal complaints. Denies n/v/f/c.   PCP: Luetta Nutting MD   Past Medical History:  Diagnosis Date   Atrial fibrillation ([place])    on Plavix   Cataract    Diabetes ([place])    Diabetic retinopathy ([place])    Exposure to Agent Orange    Hypertension    Hypertensive retinopathy    hypothyroidism    Hypothyroidism    Kidney disease     Objective:  Physical Exam: Vascular: DP/PT pulses 2/4 bilateral. CFT <3 seconds. Absent hair growth on digits. No edema.  Skin. No lacerations or abrasions bilateral feet. Nails 1-5 are thickened discolored and elongated with subungual debris.  Right great toe with small blood blister noted to medial nail fold. No open ulceration Musculoskeletal: MMT 5/5 bilateral lower extremities in DF, PF, Inversion and Eversion. Deceased ROM in DF of ankle joint. Right second and third digits amputated. Severe bunion of right foot.  Neurological: Sensation intact to light touch.   Assessment:   1. Blister (nonthermal), right great toe, initial encounter   2. Dermatophytosis of nail   3. Pain in toes of both feet   4. Type 2 diabetes mellitus with diabetic peripheral angiopathy and gangrene, without long-term current use of insulin ([place])   5. PAD (peripheral artery disease) ([place])   6. History of amputation of toe ([place])          Plan:  Patient was evaluated and treated and all questions answered. -Discussed and educated patient on diabetic foot care, especially with  regards to the vascular,  neurological and musculoskeletal systems.  -Stressed the importance of good glycemic control and the detriment of not  controlling glucose levels in relation to the foot. -Discussed supportive shoes at all times and checking feet regularly.  -Mechanically debrided all nails 1-5 bilateral using sterile nail nipper and filed with dremel without incident  -Blood bliser to medial nail fold. Covered with neosporin and bandaid.  -Answered all patient questions -Patient to return  in 3 months for at risk foot care -Patient advised to call the office if any problems or questions arise in the meantime. Return in 2 weeks to check on nail and blistered area.    Lorenda Peck, DPM

## 2022-08-04 ENCOUNTER — Ambulatory Visit: Payer: Medicare HMO | Admitting: Podiatry

## 2022-08-04 DIAGNOSIS — H2511 Age-related nuclear cataract, right eye: Secondary | ICD-10-CM | POA: Diagnosis not present

## 2022-08-04 IMAGING — CT CT ABD-PELV W/O CM
2 of 4 series · 15 of 46 positions shown, 17 images · non-contrast
Comparison: None.

CLINICAL DATA: Left upper quadrant abdominal pain and diarrhea for
appendectomy and cholecystectomy. History of diverticulitis.

EXAM:
CT ABDOMEN AND PELVIS WITHOUT CONTRAST
TECHNIQUE: Multidetector CT imaging of the abdomen and pelvis was performed
following the standard protocol without IV contrast.

[Series 2: axial st · axial · 0.68mm/px · z∈[-621,-216]mm · 12 of 89 slices shown, 14 images]
[im 4/89  soft-tissue]
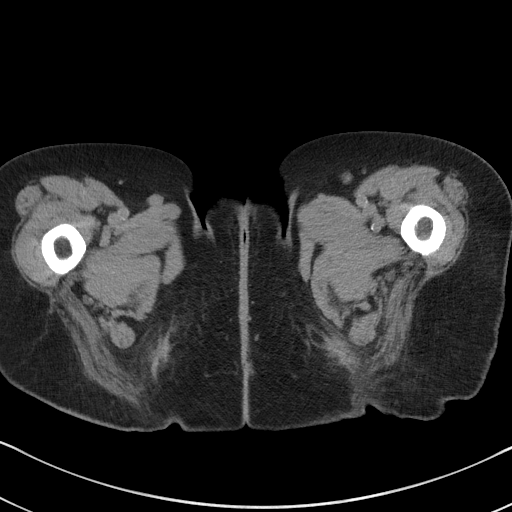
[im 4/89  bone]
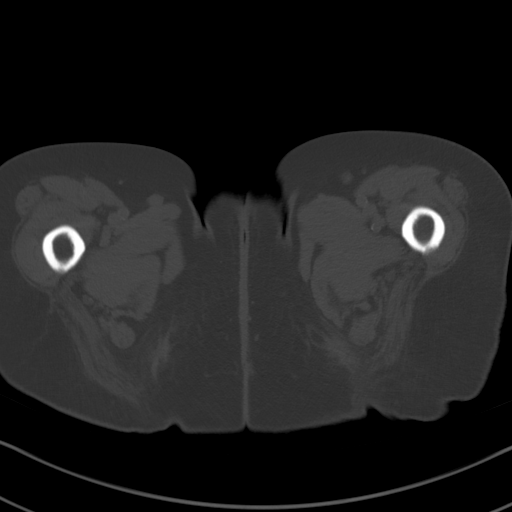
[im 12/89  soft-tissue]
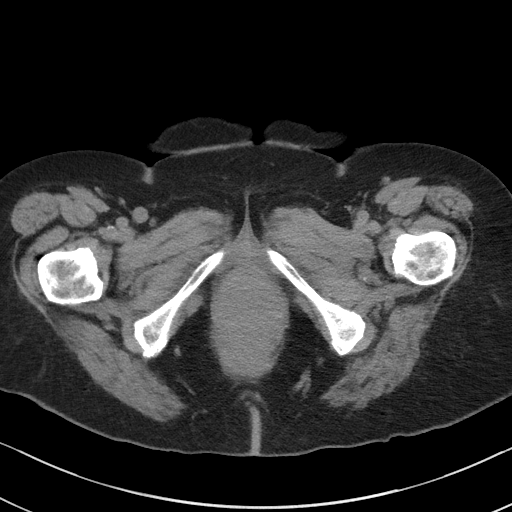
[im 19/89  soft-tissue]
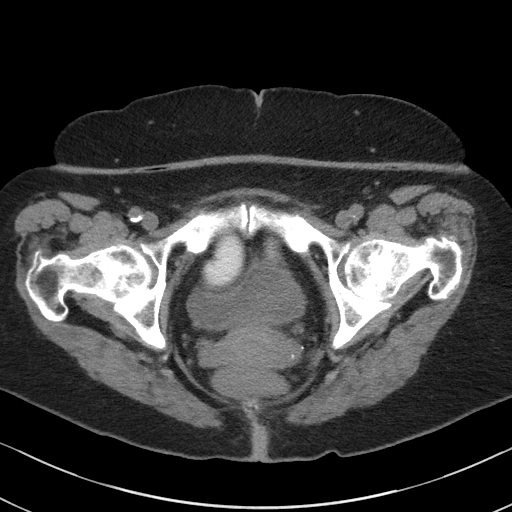
[im 26/89  soft-tissue]
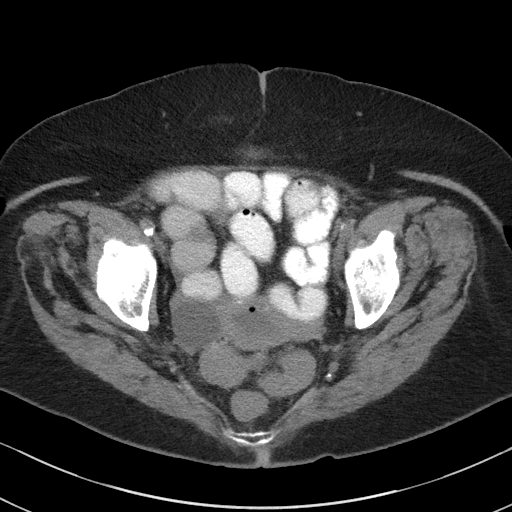
[im 34/89  soft-tissue]
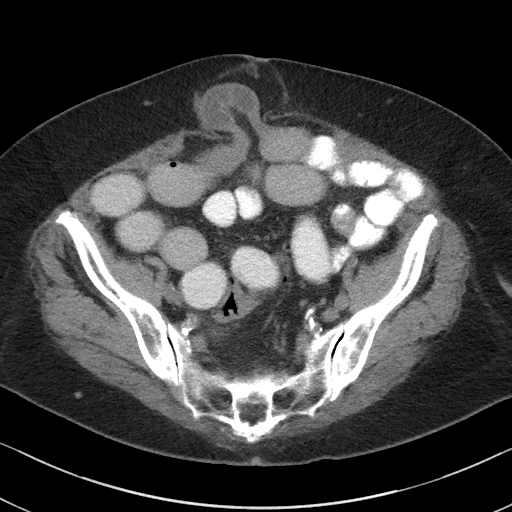
[im 41/89  soft-tissue]
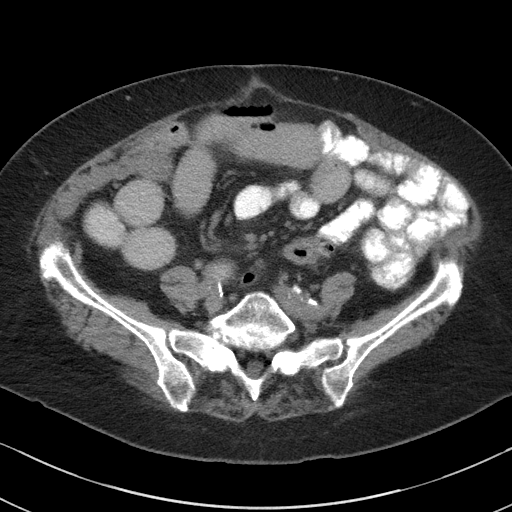
[im 48/89  soft-tissue]
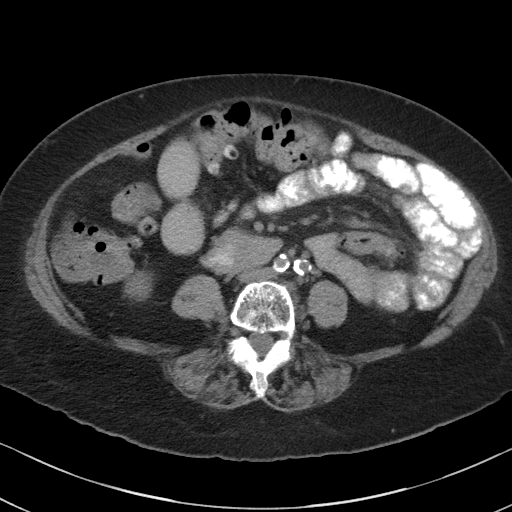
[im 56/89  soft-tissue]
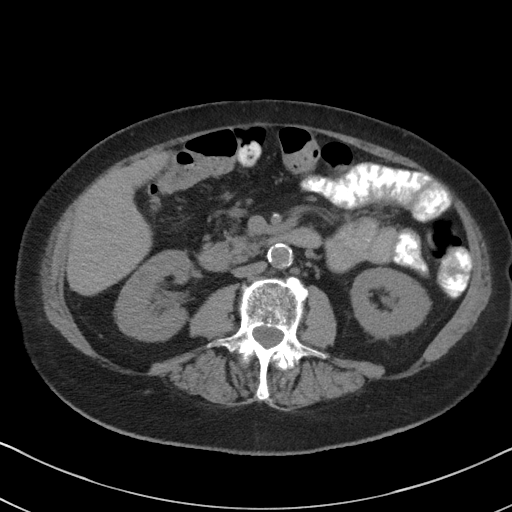
[im 63/89  soft-tissue]
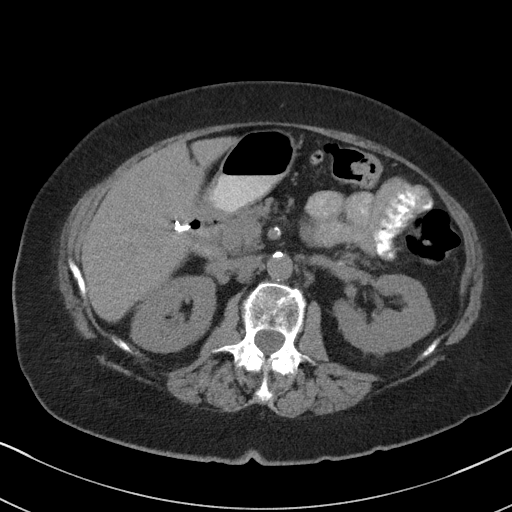
[im 63/89  bone]
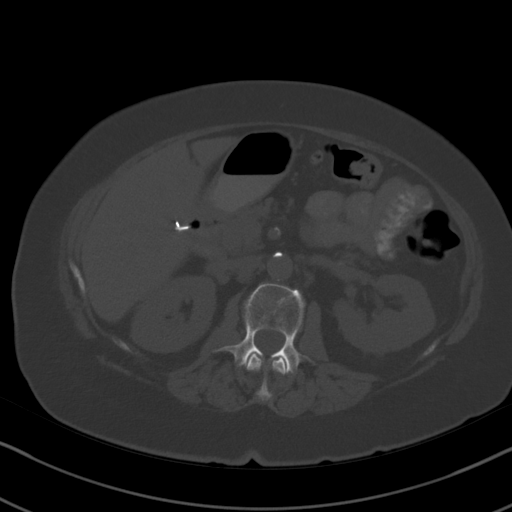
[im 70/89  soft-tissue]
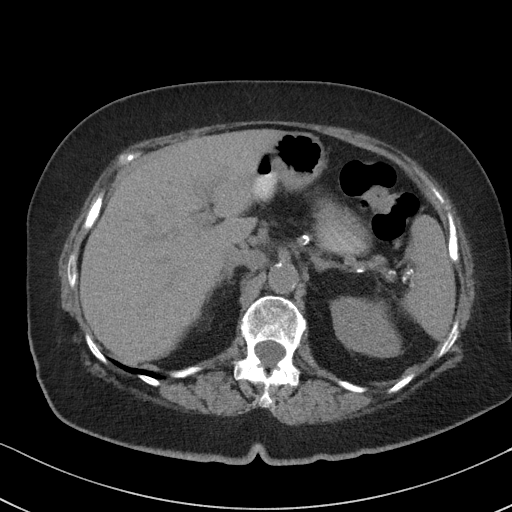
[im 78/89  soft-tissue]
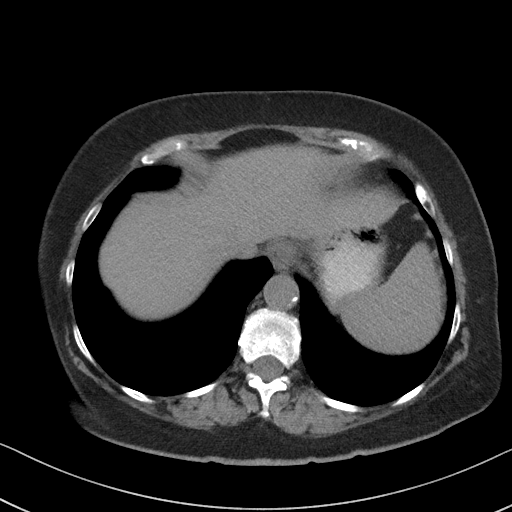
[im 85/89  soft-tissue]
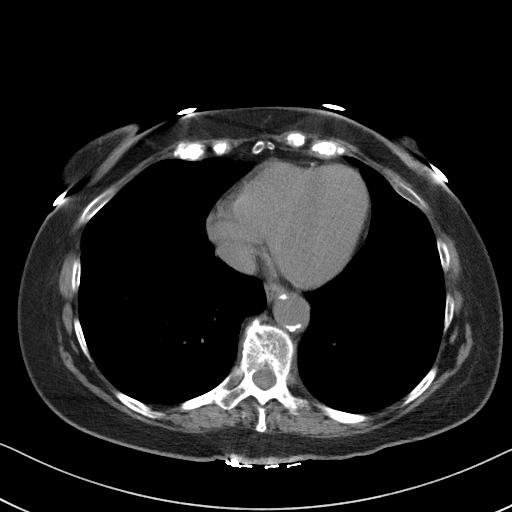

[Series 5: coronal st · coronal · 0.71mm/px · 3 of 91 slices shown]
[im 31/91  soft-tissue]
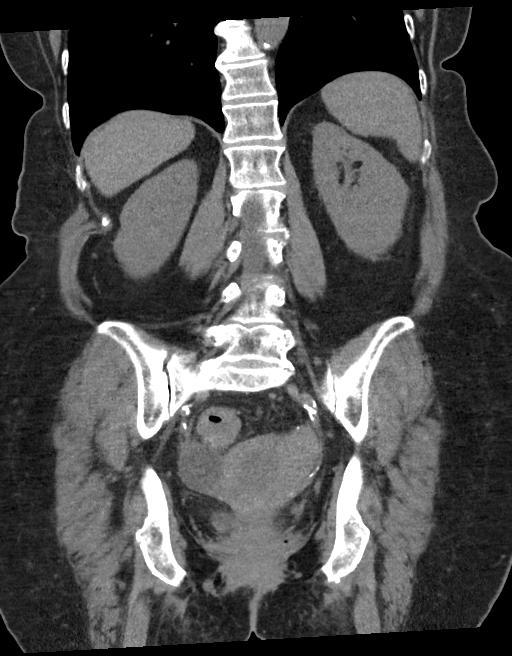
[im 41/91  soft-tissue]
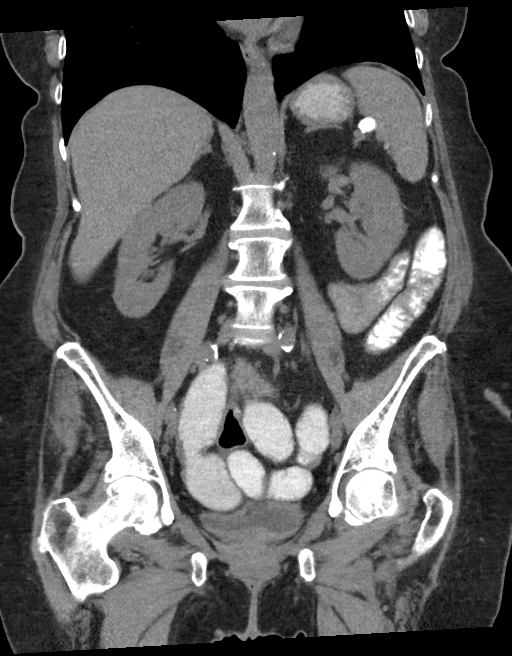
[im 51/91  soft-tissue]
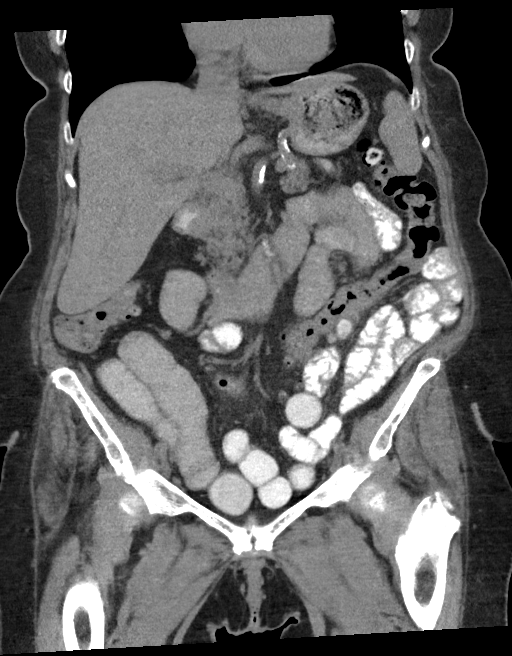

[15 of 46 positions shown; findings below may reference images not displayed]

FINDINGS: Lower chest: No significant pulmonary nodules or acute consolidative
airspace disease.

Hepatobiliary: Normal liver size. No liver mass. Cholecystectomy. No
biliary ductal dilatation.

Pancreas: Normal, with no mass or duct dilation.

Spleen: Normal size. No mass.

Adrenals/Urinary Tract: Normal adrenals. No renal stones. No
hydronephrosis. No contour deforming renal masses. Normal bladder.

Stomach/Bowel: Normal non-distended stomach. Moderate periumbilical
ventral abdominal hernia containing an incarcerated distal small
bowel loop, proximal to which the small bowel is diffusely mildly
dilated and fluid-filled (up to 3.1 cm diameter), and distal to
which the small bowel is relatively collapsed, compatible with
mechanical distal small bowel obstruction. No thick walled small
bowel loops. No pneumatosis. Appendectomy. Moderate diffuse colonic
diverticulosis with no large bowel wall thickening or significant
pericolonic fat stranding.

Vascular/Lymphatic: Atherosclerotic nonaneurysmal abdominal aorta.
It Coarsely peripherally calcified 1.8 cm rounded structure at the
splenic hilum (series 2/image 17), probably a calcified splenic
artery aneurysm. No pathologically enlarged lymph nodes in the
abdomen or pelvis.

Reproductive: Fluid and gas mildly distends the uterine cavity.
Simple 3.1 cm right adnexal cyst (series 2/image 64). No left
adnexal mass.

Other: No pneumoperitoneum, ascites or focal fluid collection.

Musculoskeletal: No aggressive appearing focal osseous lesions. Mild
thoracolumbar spondylosis.
IMPRESSION: 1. Mechanical distal small bowel obstruction due to incarcerated
distal small bowel loop within a moderate size periumbilical ventral
abdominal hernia. No free air. No abscess. No pneumatosis. Surgical
consultation advised.
2. Moderate diffuse colonic diverticulosis with no evidence of acute
diverticulitis.
3. Fluid and gas mildly distends the uterine cavity, nonspecific.
Recommend correlation with pelvic ultrasound on a short term
outpatient basis.
4. Simple 3.1 cm right adnexal cyst. Recommend follow-up US in 6-12
months. Note: This recommendation does not apply to premenarchal
patients and to those with increased risk (genetic, family history,
elevated tumor markers or other high-risk factors) of ovarian
cancer. Reference: JACR [DATE]):[id number]
5. Aortic Atherosclerosis ([id number]).

## 2022-08-10 ENCOUNTER — Other Ambulatory Visit: Payer: Self-pay | Admitting: Family Medicine

## 2022-08-10 DIAGNOSIS — E1152 Type 2 diabetes mellitus with diabetic peripheral angiopathy with gangrene: Secondary | ICD-10-CM

## 2022-08-12 ENCOUNTER — Encounter: Payer: Self-pay | Admitting: Podiatry

## 2022-08-12 ENCOUNTER — Ambulatory Visit: Payer: Medicare HMO | Admitting: Podiatry

## 2022-08-12 DIAGNOSIS — M21611 Bunion of right foot: Secondary | ICD-10-CM

## 2022-08-12 DIAGNOSIS — E1152 Type 2 diabetes mellitus with diabetic peripheral angiopathy with gangrene: Secondary | ICD-10-CM | POA: Diagnosis not present

## 2022-08-12 DIAGNOSIS — I739 Peripheral vascular disease, unspecified: Secondary | ICD-10-CM

## 2022-08-12 DIAGNOSIS — S90421A Blister (nonthermal), right great toe, initial encounter: Secondary | ICD-10-CM

## 2022-08-12 DIAGNOSIS — Z89619 Acquired absence of unspecified leg above knee: Secondary | ICD-10-CM

## 2022-08-12 NOTE — Progress Notes (Signed)
  Subjective:  Patient ID: Erin Mitchell, female    DOB: 1945-09-08,   MRN: [id number]  Chief Complaint  Patient presents with   Blister    Right foot blister next to great toe    77 y.o. female presents for follow-up of right great toe blister. Has been dressing as instructed. . She is diabetic and last A1c was 6.7. 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 Also has a history of PAD . Denies any other pedal complaints. Denies n/v/f/c.   PCP: Luetta Nutting MD   Past Medical History:  Diagnosis Date   Atrial fibrillation ([place])    on Plavix   Cataract    Diabetes ([place])    Diabetic retinopathy ([place])    Exposure to Agent Orange    Hypertension    Hypertensive retinopathy    hypothyroidism    Hypothyroidism    Kidney disease     Objective:  Physical Exam: Vascular: DP/PT pulses 2/4 bilateral. CFT <3 seconds. Absent hair growth on digits. No edema.  Skin. No lacerations or abrasions bilateral feet. Nails 1-5 are thickened discolored and elongated with subungual debris.  Right great toe hhealed well no open areas.  Musculoskeletal: MMT 5/5 bilateral lower extremities in DF, PF, Inversion and Eversion. Deceased ROM in DF of ankle joint. Right second and third digits amputated. Severe bunion of right foot.  Neurological: Sensation intact to light touch.   Assessment:   1. Type 2 diabetes mellitus with diabetic peripheral angiopathy and gangrene, without long-term current use of insulin (HCC)   2. Blister (nonthermal), right great toe, initial encounter   3. Bunion, right   4. 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 PAD (peripheral artery disease) ([place])          Plan:  Patient was evaluated and treated and all questions answered. -Right great toenail appears to be healing well.  -May discontinue dressings.  -Will schedule for DM shoe fitting.  -Patient to return  in 3 months for at risk  foot care    Lorenda Peck, DPM

## 2022-08-15 DIAGNOSIS — H43821 Vitreomacular adhesion, right eye: Secondary | ICD-10-CM | POA: Diagnosis not present

## 2022-08-15 DIAGNOSIS — H2512 Age-related nuclear cataract, left eye: Secondary | ICD-10-CM | POA: Diagnosis not present

## 2022-08-15 DIAGNOSIS — E113393 Type 2 diabetes mellitus with moderate nonproliferative diabetic retinopathy without macular edema, bilateral: Secondary | ICD-10-CM | POA: Diagnosis not present

## 2022-08-15 DIAGNOSIS — Z961 Presence of intraocular lens: Secondary | ICD-10-CM | POA: Diagnosis not present

## 2022-08-22 NOTE — Progress Notes (Signed)
[facility] Note  08/31/2022     CHIEF COMPLAINT Patient presents for Retina Follow Up  HISTORY OF PRESENT ILLNESS: Erin Mitchell is a 77 y.o. female who presents to the clinic today for:   HPI     Retina Follow Up   Patient presents with  Diabetic Retinopathy.  In both eyes.  Severity is moderate.  Duration of 5 weeks.  Since onset it is stable.  I, the attending physician,  performed the HPI with the patient and updated documentation appropriately.        Comments   Pt here for 5 wk ret f/u for NPDR OU. Pt states VA has improved, had cataract sx OD on 08/04/22 w/ Dr. Nancy Fetter. Pt reports VA in OD is better-still some distortion but overall she's happy with the change. OS has not been scheduled due to not being developed enough. Still on prednisone-1 gtt OD QD until Friday, the other drop (grey top) she's QID until Friday. Follow up is scheduled for Monday, 09/05/22.       Last edited by Bernarda Caffey, MD on 08/31/2022  1:45 PM.    Pt had cataract sx OD on 02.29.24 with Dr. Nancy Fetter, pt is happy with the results, no plans for OS yet, but she has a follow up on April 1  Referring physician: Luetta Nutting, [place] [address] [place],  [place] [address]  HISTORICAL INFORMATION:   Selected notes from the MEDICAL RECORD NUMBER Referred by Dr. Luetta Nutting for DM exam LEE:  Ocular Hx- PMH- DM, HTN, afib, kidney disease, last a1c was 6.8 on 05.05.22    CURRENT MEDICATIONS: No current outpatient medications on file. (Ophthalmic Drugs)   No current facility-administered medications for this visit. (Ophthalmic Drugs)   Current Outpatient Medications (Other)  Medication Sig   Alcohol Swabs (DROPSAFE ALCOHOL PREP) 70 % PADS USE AS DIRECTED.   Blood Glucose Monitoring Suppl (TRUE METRIX METER) w/Device KIT Use as directed to test blood glucose daily   Cholecalciferol (VITAMIN D-1000 MAX ST PO) Take 2,000 Units by mouth daily.    clopidogrel (PLAVIX) 75 MG tablet TAKE 1 TABLET EVERY DAY   loperamide (IMODIUM A-D) 2 MG tablet Take 2-4 mg by mouth 4 (four) times daily as needed for diarrhea or loose stools.   losartan (COZAAR) 100 MG tablet TAKE 1 TABLET EVERY DAY   Melatonin 10 MG CAPS Take 10 mg by mouth at bedtime.   metFORMIN (GLUCOPHAGE-XR) 500 MG 24 hr tablet Take 2 tablets (1,000 mg total) by mouth daily with breakfast. (Patient taking differently: Take 2,000 mg by mouth as directed. 1000 mg in the morning and 1000 mg at night)   metoprolol tartrate (LOPRESSOR) 25 MG tablet TAKE 1 TABLET TWICE DAILY WITH FOOD   NP THYROID 90 MG tablet TAKE 1 TABLET (90 MG TOTAL) BY MOUTH DAILY. NO REFILLS. NEEDS LABS.   Probiotic Product (ALIGN) 4 MG CAPS Take 4 mg by mouth daily.   rosuvastatin (CRESTOR) 40 MG tablet TAKE 1 TABLET EVERY DAY   TRUE METRIX BLOOD GLUCOSE TEST test strip USE AS INSTRUCTED   TRUEplus Lancets 33G MISC Use as directed daily.   Zinc Sulfate (ZINC 15 PO) Take by mouth.   No current facility-administered medications for this visit. (Other)   REVIEW OF SYSTEMS: ROS   Positive for: Endocrine, Cardiovascular, Eyes Negative for: Constitutional, Gastrointestinal, Neurological, Skin, Genitourinary, Musculoskeletal, HENT, Respiratory, Psychiatric, Allergic/Imm, Heme/Lymph Last edited by Orvan Falconer  E, COT on 08/31/2022  8:34 AM.       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   VENTRAL HERNIA REPAIR N/A 12/28/2020   Procedure: LAPAROSCOPIC VENTRAL  [place];  Surgeon: Armandina Gemma, MD;  Location: [facility];  Service: General;  Laterality: N/A;   FAMILY HISTORY Family History  Problem Relation Age of Onset   Hypertension Father    Diabetes Brother    Emphysema Mother    SOCIAL HISTORY Social History   Tobacco Use   Smoking status: Former    Packs/day: 0.25    Years: 1.00    Additional pack years: 0.00    Total pack years: 0.25    Types: Cigarettes    Quit date: 06/06/1970    Years since quitting: 52.2   Smokeless tobacco: Never  Vaping Use   Vaping Use: Never used  Substance Use Topics   Alcohol use: Never   Drug use: Never       OPHTHALMIC EXAM: Base Eye Exam     Visual Acuity (Snellen - Linear)       Right Left   Dist [place] 20/30 -1    Dist cc  20/20   Dist ph Bruce NI     Correction: Glasses         Tonometry (Tonopen, 8:40 AM)       Right Left   Pressure 10 12         Pupils       Dark Light Shape React APD   Right 2 1 Round Minimal None   Left 2 1 Round Minimal None         Visual Fields (Counting fingers)       Left Right    Full Full         Extraocular Movement       Right Left    Full, Ortho Full, Ortho         Neuro/Psych     Oriented x3: Yes   Mood/Affect: Normal         Dilation     Both eyes: 1.0% Mydriacyl, 2.5% Phenylephrine @ 8:41 AM           Slit Lamp and Fundus Exam     Slit Lamp Exam       Right Left   Lids/Lashes Dermatochalasis - upper lid, Meibomian gland dysfunction Dermatochalasis - upper lid, Meibomian gland dysfunction   Conjunctiva/Sclera White and quiet White and quiet   Cornea arcus, trace tear film debris, well healed cataract wound arcus, trace PEE, mild tear film debris   Anterior Chamber Moderate depth, clear, very narrow temporal angles Moderate depth, clear, very narrow temporal angles   Iris Round and moderately dilated, mild anterior bowing Round and moderately dilated, mild anterior bowing   Lens PC IOL  in good position 2-3+ Nuclear sclerosis with brunescence, 2-3+ Cortical  cataract   Anterior Vitreous Vitreous syneresis, Posterior vitreous detachment, trace VH settled inferiorly Vitreous syneresis, Posterior vitreous detachment, vitreous condensations         Fundus Exam       Right Left   Disc Trace pallor, Sharp rim, +cupping Pink and Sharp, +cupping   C/D Ratio 0.7 0.65   Macula Blunted foveal reflex, central cystic changes / edema -- slightly improved, RPE mottling, fine drusen, pigment clumping, cluster of MA temporal to fovea and temporal macula -- improving, mild ERM Flat, Blunted foveal reflex, RPE mottling, focal MA temp mac, no exudates, cystic changes nasal macula -- persistent   Vessels attenuated, Tortuous, AV crossing changes attenuated, Tortuous   Periphery Attached, mild reticular degeneration, mild MA Attached, mild reticular degeneration, scattered MA, scattered DBH greatest posteriorly           Refraction     Wearing Rx       Sphere Cylinder Add   Right +1.25 Sphere +2.75   Left +1.50 Sphere +2.75    Type: Bifocal           IMAGING AND PROCEDURES  Imaging and Procedures for 08/31/2022  OCT, Retina - OU - Both Eyes       Right Eye Quality was good. Central Foveal Thickness: 378. Progression has improved. Findings include no SRF, abnormal foveal contour, intraretinal hyper-reflective material, intraretinal fluid, vitreomacular adhesion (Mild interval improvement in central cyst and surrounding cystic changes).   Left Eye Quality was good. Central Foveal Thickness: 267. Progression has been stable. Findings include no SRF, abnormal foveal contour, intraretinal fluid, vitreous traction, vitreomacular adhesion (Persistent cystic changes nasal macula, persistent central VMT).   Notes *Images captured and stored on drive  Diagnosis / Impression:  OD: +central DME, Mild interval improvement in central cyst and surrounding cystic changes OS: Persistent  cystic changes nasal macula, persistent central VMT  Clinical management:  See below  Abbreviations: NFP - Normal foveal profile. CME - cystoid macular edema. PED - pigment epithelial detachment. IRF - intraretinal fluid. SRF - subretinal fluid. EZ - ellipsoid zone. ERM - epiretinal membrane. ORA - outer retinal atrophy. ORT - outer retinal tubulation. SRHM - subretinal hyper-reflective material. IRHM - intraretinal hyper-reflective material      Intravitreal Injection, Pharmacologic Agent - OD - Right Eye       Time Out 08/31/2022. 9:19 AM. Confirmed correct patient, procedure, site, and patient consented.   Anesthesia Topical anesthesia was used. Anesthetic medications included Lidocaine 2%, Proparacaine 0.5%.   Procedure Preparation included 5% betadine to ocular surface, eyelid speculum. A (32g) needle was used.   Injection: 6 mg faricimab-svoa 6 MG/0.05ML   Route: Intravitreal, Site: Right Eye   NDC: [id number], Lot: [id number], Expiration date: 07/06/2024, Waste: 0 mL   Post-op Post injection exam found visual acuity of at least counting fingers. The patient tolerated the procedure well. There were no complications. The patient received written and verbal post procedure care education. Post injection medications were not given.             ASSESSMENT/PLAN:    ICD-10-CM   1. Moderate nonproliferative diabetic retinopathy of both eyes with macular edema associated with type 2 diabetes mellitus (HCC)  E11.3313 OCT, Retina - OU - Both Eyes    Intravitreal Injection, Pharmacologic Agent - OD - Right Eye    faricimab-svoa (VABYSMO) 6mg /0.48mL intravitreal injection    2. 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 Essential hypertension  I10  4. Hypertensive retinopathy of both eyes  H35.033     5. Pseudophakia  Z96.1     6. Combined forms of age-related cataract of left eye  H25.812     7. Glaucoma suspect of both eyes  H40.003       1. Moderate  non-proliferative diabetic retinopathy, both eyes  - s/p IVE OD #1 (06.22.22)--sample, #2 (09.07.22), #3 (11.14.22), #4 (12.28.22), #5 (02.08.23), IVE #6 (03.15.23), #7 (04.19.23), #8 (05.24.23), #9 (06.28.23), #10 (07.27.23), #11 (08.24.23) -- IVE resistance  - s/p IVV OD #1 (09.21.23), #2 (10.19.23), #3 (11.16.23), #4 (12.14.23), #5 (01.18.24), #6 (02.21.24) - previously seen at [facility] by Dr. Theora Gianotti (Fax [phone]) - history of anti-VEGF injections since 2019 -- last injection IVE OD Feb/Mar 2022 -- q30mo interval - FA (11.14.22) shows OD: Focal hyperflourescence IT to fovea; no NV; OS: No NV - exam w/ scattered MA - OCT shows OD: OD: +central DME, Mild interval improvement in central cyst and surrounding cystic changes; OS: Persistent cystic changes nasal macula, persistent central VMT at 5 wks - BCVA OD 20/30 -- improved s/p CEIOL; OS 20/20 -- stable. - recommend IVV OD #7 today, 03.27.24 -- f/u in 5 wks again - pt wishes to proceed - RBA of procedure discussed, questions answered - IVE informed consent obtained and re-signed 06.28.23 - IVV informed consent obtained and signed, 09.21.23 - see procedure note - f/u in 5 wks  -- DFE/OCT, possible injection  2. VMA/VMT OU  - central VMA/VMT OD may be contributing to central cyst  - OS stable  - monitor  3,4. Hypertensive retinopathy OU - discussed importance of tight BP control - monitor  5. Pseudophakia OD  - s/p CE/IOL OD (02.29.24, Dr. Nancy Fetter)  - IOL in good position, doing well  - monitor  6. Mixed Cataract OS - The symptoms of cataract, surgical options, and treatments and risks were discussed with patient. - discussed diagnosis and progression - under the expert management of Dr. Nancy Fetter  7. Glaucoma suspect  - C/D 0.7 OU  - IOPs okay  - under the expert management of Dr. Nancy Fetter   Ophthalmic Meds Ordered this visit:  Meds ordered this encounter  Medications   faricimab-svoa (VABYSMO) 6mg /0.74mL  intravitreal injection     Return in about 5 weeks (around 10/05/2022) for f/u NPDR OU, DFE, OCT.  There are no Patient Instructions on file for this visit.  This document serves as a record of services personally performed by Gardiner Sleeper, MD, PhD. It was created on their behalf by Orvan Falconer, an ophthalmic technician. The creation of this record is the provider's dictation and/or activities during the visit.    Electronically signed by: Orvan Falconer, OA, 09/01/22  12:58 PM  This document serves as a record of services personally performed by Gardiner Sleeper, MD, PhD. It was created on their behalf by San Jetty. Owens Shark, OA an ophthalmic technician. The creation of this record is the provider's dictation and/or activities during the visit.    Electronically signed by: San Jetty. Owens Shark, OA @TODAY @ 12:58 PM  Gardiner Sleeper, M.D., Ph.D. Diseases & Surgery of the Retina and Vitreous [facility]  I have reviewed the above documentation for accuracy and completeness, and I agree with the above. Gardiner Sleeper, M.D., Ph.D. 09/01/22 12:58 PM   Abbreviations: M myopia (nearsighted); A astigmatism; H hyperopia (farsighted); P presbyopia; Mrx spectacle prescription;  CTL contact lenses; OD right eye; OS left eye;  OU both eyes  XT exotropia; ET esotropia; PEK punctate epithelial keratitis; PEE punctate epithelial erosions; DES dry eye syndrome; MGD meibomian gland dysfunction; ATs artificial tears; PFAT's preservative free artificial tears; [place] nuclear sclerotic cataract; PSC posterior subcapsular cataract; ERM epi-retinal membrane; PVD posterior vitreous detachment; RD retinal detachment; DM diabetes mellitus; DR diabetic retinopathy; NPDR non-proliferative diabetic retinopathy; PDR proliferative diabetic retinopathy; CSME clinically significant macular edema; DME diabetic macular edema; dbh dot blot hemorrhages; CWS cotton wool spot; POAG primary open angle glaucoma; C/D  cup-to-disc ratio; HVF humphrey visual field; GVF goldmann visual field; OCT optical coherence tomography; IOP intraocular pressure; BRVO Branch retinal vein occlusion; CRVO central retinal vein occlusion; CRAO central retinal artery occlusion; BRAO branch retinal artery occlusion; RT retinal tear; SB scleral buckle; PPV pars plana vitrectomy; VH Vitreous hemorrhage; PRP panretinal laser photocoagulation; IVK intravitreal kenalog; VMT vitreomacular traction; MH Macular hole;  NVD neovascularization of the disc; NVE neovascularization elsewhere; AREDS age related eye disease study; ARMD age related macular degeneration; POAG primary open angle glaucoma; EBMD epithelial/anterior basement membrane dystrophy; ACIOL anterior chamber intraocular lens; IOL intraocular lens; PCIOL posterior chamber intraocular lens; Phaco/IOL phacoemulsification with intraocular lens placement; [place] photorefractive keratectomy; LASIK laser assisted in situ keratomileusis; HTN hypertension; DM diabetes mellitus; COPD chronic obstructive pulmonary disease

## 2022-08-24 ENCOUNTER — Ambulatory Visit: Payer: Medicare HMO

## 2022-08-24 NOTE — Progress Notes (Incomplete)
Patient presents to the office today for diabetic shoe and insole measuring.  Patient was measured with brannock device to determine size and width for 1 pair of extra depth shoes and foam casted for 3 pair of insoles.   ABN signed.   Documentation of medical necessity will be sent to patient's treating diabetic doctor to verify and sign.   Patient's diabetic provider: Luetta Nutting   Shoes and insoles will be ordered at that time and patient will be notified for an appointment for fitting when they arrive.   Brannock measurement: ***  Patient shoe selection-   1st   Shoe choice:   [id number] Orth[employer]et  2nd  Shoe choice:   [id number]  OrthoFEet   Shoe size ordered: ***

## 2022-08-27 ENCOUNTER — Other Ambulatory Visit: Payer: Self-pay | Admitting: Family Medicine

## 2022-08-27 DIAGNOSIS — I10 Essential (primary) hypertension: Secondary | ICD-10-CM

## 2022-08-27 DIAGNOSIS — I480 Paroxysmal atrial fibrillation: Secondary | ICD-10-CM

## 2022-08-27 DIAGNOSIS — I739 Peripheral vascular disease, unspecified: Secondary | ICD-10-CM

## 2022-08-31 ENCOUNTER — Ambulatory Visit (INDEPENDENT_AMBULATORY_CARE_PROVIDER_SITE_OTHER): Payer: Medicare HMO | Admitting: Ophthalmology

## 2022-08-31 ENCOUNTER — Encounter (INDEPENDENT_AMBULATORY_CARE_PROVIDER_SITE_OTHER): Payer: Self-pay | Admitting: Ophthalmology

## 2022-08-31 DIAGNOSIS — Z961 Presence of intraocular lens: Secondary | ICD-10-CM

## 2022-08-31 DIAGNOSIS — I10 Essential (primary) hypertension: Secondary | ICD-10-CM

## 2022-08-31 DIAGNOSIS — E113313 Type 2 diabetes mellitus with moderate nonproliferative diabetic retinopathy with macular edema, bilateral: Secondary | ICD-10-CM | POA: Diagnosis not present

## 2022-08-31 DIAGNOSIS — H40003 Preglaucoma, unspecified, bilateral: Secondary | ICD-10-CM

## 2022-08-31 DIAGNOSIS — H25813 Combined forms of age-related cataract, bilateral: Secondary | ICD-10-CM

## 2022-08-31 DIAGNOSIS — H43821 Vitreomacular adhesion, right eye: Secondary | ICD-10-CM

## 2022-08-31 DIAGNOSIS — H43823 Vitreomacular adhesion, bilateral: Secondary | ICD-10-CM

## 2022-08-31 DIAGNOSIS — H35033 Hypertensive retinopathy, bilateral: Secondary | ICD-10-CM

## 2022-08-31 DIAGNOSIS — H25812 Combined forms of age-related cataract, left eye: Secondary | ICD-10-CM

## 2022-08-31 MED ORDER — FARICIMAB-SVOA 6 MG/0.05ML IZ SOLN
6.0000 mg | INTRAVITREAL | Status: AC | PRN
  Administered 2022-08-31: 6 mg via INTRAVITREAL

## 2022-09-05 DIAGNOSIS — E113393 Type 2 diabetes mellitus with moderate nonproliferative diabetic retinopathy without macular edema, bilateral: Secondary | ICD-10-CM | POA: Diagnosis not present

## 2022-09-05 DIAGNOSIS — Z0100 Encounter for examination of eyes and vision without abnormal findings: Secondary | ICD-10-CM | POA: Diagnosis not present

## 2022-09-16 ENCOUNTER — Telehealth: Payer: Self-pay

## 2022-09-16 NOTE — Telephone Encounter (Signed)
Patient received new Metformin RX with different sig instructions and requested clarification.   Returned pt's call. Advised medication was switched from Metformin to Metformin XR. Pt is to follow sig written on new Rx.   She expressed understanding and agreed to treatment plan. States she remembered Dr. Ashley Royalty talking to her about it but wasn't sure when it would start.

## 2022-09-26 ENCOUNTER — Other Ambulatory Visit: Payer: Self-pay | Admitting: Cardiology

## 2022-09-26 NOTE — Telephone Encounter (Signed)
Rx request sent to pharmacy.  

## 2022-09-29 NOTE — Progress Notes (Signed)
[facility] - Clinic Note  10/05/2022     CHIEF COMPLAINT Patient presents for Retina Follow Up  HISTORY OF PRESENT ILLNESS: Erin Mitchell is a 77 y.o. female who presents to the clinic today for:   HPI     Retina Follow Up   Patient presents with  Diabetic Retinopathy.  In both eyes.  This started weeks ago.  Severity is moderate.  Duration of 5 weeks.  Since onset it is stable.  I, the attending physician,  performed the HPI with the patient and updated documentation appropriately.        Comments   Patient feels that there has been an improvement in vision since the cataract surgery. She had the right eye done 08/04/22. She is not using any eye drops at this time. Her blood sugar was 121.       Last edited by Rennis Chris, MD on 10/05/2022  5:03 PM.    Pt states vision is improving, pt states blood sugar numbers have been high bc her dr changes her medication, she states her resting blood sugar has been around 150, but this morning it was 121  Referring physician: Everrett Coombe, DO [id number]  [address] [place],  [place] [address]  HISTORICAL INFORMATION:   Selected notes from the MEDICAL RECORD NUMBER Referred by Dr. Everrett Coombe for DM exam LEE:  Ocular Hx- PMH- DM, HTN, afib, kidney disease, last a1c was 6.8 on 05.05.22    CURRENT MEDICATIONS: No current outpatient medications on file. (Ophthalmic Drugs)   No current facility-administered medications for this visit. (Ophthalmic Drugs)   Current Outpatient Medications (Other)  Medication Sig   Alcohol Swabs (DROPSAFE ALCOHOL PREP) 70 % PADS USE AS DIRECTED.   Blood Glucose Monitoring Suppl (TRUE METRIX METER) w/Device KIT Use as directed to test blood glucose daily   Cholecalciferol (VITAMIN D-1000 MAX ST PO) Take 2,000 Units by mouth daily.   clopidogrel (PLAVIX) 75 MG tablet TAKE 1 TABLET EVERY DAY   loperamide (IMODIUM A-D) 2 MG tablet Take 2-4 mg by mouth 4 (four)  times daily as needed for diarrhea or loose stools.   losartan (COZAAR) 100 MG tablet TAKE 1 TABLET EVERY DAY   Melatonin 10 MG CAPS Take 10 mg by mouth at bedtime.   metFORMIN (GLUCOPHAGE-XR) 500 MG 24 hr tablet Take 2 tablets (1,000 mg total) by mouth daily with breakfast. (Patient taking differently: Take 2,000 mg by mouth as directed. 1000 mg in the morning and 1000 mg at night)   metoprolol tartrate (LOPRESSOR) 25 MG tablet TAKE 1 TABLET TWICE DAILY WITH FOOD   NP THYROID 90 MG tablet TAKE 1 TABLET (90 MG TOTAL) BY MOUTH DAILY. NO REFILLS. NEEDS LABS.   Probiotic Product (ALIGN) 4 MG CAPS Take 4 mg by mouth daily.   rosuvastatin (CRESTOR) 40 MG tablet TAKE 1 TABLET EVERY DAY   TRUE METRIX BLOOD GLUCOSE TEST test strip USE AS INSTRUCTED   TRUEplus Lancets 33G MISC Use as directed daily.   Zinc Sulfate (ZINC 15 PO) Take by mouth.   No current facility-administered medications for this visit. (Other)   REVIEW OF SYSTEMS: ROS   Positive for: Endocrine, Cardiovascular, Eyes Negative for: Constitutional, Gastrointestinal, Neurological, Skin, Genitourinary, Musculoskeletal, HENT, Respiratory, Psychiatric, Allergic/Imm, Heme/Lymph Last edited by Julieanne Cotton, COT on 10/05/2022  8:39 AM.     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 & 3rd   VENTRAL HERNIA REPAIR N/A 12/28/2020   Procedure: LAPAROSCOPIC VENTRAL INCISIONAL HERNIA REPAIR WITH MESH;  Surgeon: Darnell Level, MD;  Location: [facility];  Service: General;  Laterality: N/A;   FAMILY  HISTORY Family History  Problem Relation Age of Onset   Hypertension Father    Diabetes Brother    Emphysema Mother    SOCIAL HISTORY Social History   Tobacco Use   Smoking status: Former    Packs/day: 0.25    Years: 1.00    Additional pack years: 0.00    Total pack years: 0.25    Types: Cigarettes    Quit date: 06/06/1970    Years since quitting: 52.3   Smokeless tobacco: Never  Vaping Use   Vaping Use: Never used  Substance Use Topics   Alcohol use: Never   Drug use: Never       OPHTHALMIC EXAM: Base Eye Exam     Visual Acuity (Snellen - Linear)       Right Left   Dist [place] 20/30    Dist cc  20/20   Dist ph [place] NI     Correction: Glasses         Tonometry (Tonopen, 8:42 AM)       Right Left   Pressure 12 12         Pupils       Dark Light Shape React APD   Right 2 1 Round Minimal None   Left 2 1 Round Minimal None         Visual Fields       Left Right    Full Full         Extraocular Movement       Right Left    Full, Ortho Full, Ortho         Neuro/Psych     Oriented x3: Yes   Mood/Affect: Normal         Dilation     Both eyes: 1.0% Mydriacyl, 2.5% Phenylephrine @ 8:40 AM           Slit Lamp and Fundus Exam     Slit Lamp Exam       Right Left   Lids/Lashes Dermatochalasis - upper lid, Meibomian gland dysfunction Dermatochalasis - upper lid, Meibomian gland dysfunction   Conjunctiva/Sclera White and quiet White and quiet   Cornea arcus, trace tear film debris, well healed cataract wound arcus, trace PEE, mild tear film debris   Anterior Chamber Moderate depth, clear, very narrow temporal angles Moderate depth, clear, very narrow temporal angles   Iris Round and moderately dilated, mild anterior bowing Round and moderately dilated, mild anterior bowing   Lens PC IOL in good position 2-3+ Nuclear sclerosis with brunescence, 2-3+ Cortical cataract   Anterior Vitreous Vitreous syneresis, Posterior vitreous detachment,  trace VH settled inferiorly Vitreous syneresis, Posterior vitreous detachment, vitreous condensations         Fundus Exam  Right Left   Disc Trace pallor, Sharp rim, +cupping Pink and Sharp, +cupping   C/D Ratio 0.7 0.65   Macula Blunted foveal reflex, central cystic changes / edema -- slightly improved, RPE mottling, fine drusen, pigment clumping, cluster of MA temporal to fovea and temporal macula -- improving, mild ERM, punctate central exudation Flat, Blunted foveal reflex, RPE mottling, focal MA temp mac, no exudates, cystic changes nasal macula -- stably improved   Vessels attenuated, Tortuous attenuated, Tortuous   Periphery Attached, mild reticular degeneration, mild MA Attached, mild reticular degeneration, scattered MA, scattered DBH greatest posteriorly           Refraction     Wearing Rx       Sphere Cylinder Add   Right +1.25 Sphere +2.75   Left +1.50 Sphere +2.75    Type: Bifocal           IMAGING AND PROCEDURES  Imaging and Procedures for 10/05/2022  OCT, Retina - OU - Both Eyes       Right Eye Quality was good. Central Foveal Thickness: 351. Progression has improved. Findings include no SRF, abnormal foveal contour, intraretinal hyper-reflective material, intraretinal fluid, vitreomacular adhesion (Mild interval improvement in central cyst and surrounding cystic changes).   Left Eye Quality was good. Central Foveal Thickness: 263. Progression has been stable. Findings include no SRF, abnormal foveal contour, intraretinal fluid, vitreous traction, vitreomacular adhesion (Persistent cystic changes nasal macula, persistent central VMT).   Notes *Images captured and stored on drive  Diagnosis / Impression:  OD: +central DME, Mild interval improvement in central cyst and surrounding cystic changes OS: Persistent cystic changes nasal macula, persistent central VMT  Clinical management:  See below  Abbreviations: NFP - Normal foveal profile. CME -  cystoid macular edema. PED - pigment epithelial detachment. IRF - intraretinal fluid. SRF - subretinal fluid. EZ - ellipsoid zone. ERM - epiretinal membrane. ORA - outer retinal atrophy. ORT - outer retinal tubulation. SRHM - subretinal hyper-reflective material. IRHM - intraretinal hyper-reflective material      Intravitreal Injection, Pharmacologic Agent - OD - Right Eye       Time Out 10/05/2022. 9:07 AM. Confirmed correct patient, procedure, site, and patient consented.   Anesthesia Topical anesthesia was used. Anesthetic medications included Lidocaine 2%, Proparacaine 0.5%.   Procedure Preparation included 5% betadine to ocular surface, eyelid speculum. A (32g) needle was used.   Injection: 6 mg faricimab-svoa 6 MG/0.05ML   Route: Intravitreal, Site: Right Eye   NDC: [phone], Lot: [id number], Expiration date: 09/03/2024, Waste: 0 mL   Post-op Post injection exam found visual acuity of at least counting fingers. The patient tolerated the procedure well. There were no complications. The patient received written and verbal post procedure care education. Post injection medications were not given.            ASSESSMENT/PLAN:    ICD-10-CM   1. Moderate nonproliferative diabetic retinopathy of both eyes with macular edema associated with type 2 diabetes mellitus (HCC)  E11.3313 OCT, Retina - OU - Both Eyes    Intravitreal Injection, Pharmacologic Agent - OD - Right Eye    faricimab-svoa (VABYSMO) 6mg /0.58mL intravitreal injection    2. Long term (current) use of oral hypoglycemic drugs  Z79.84     3. 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 Essential hypertension  I10     5. Hypertensive retinopathy of both eyes  H35.033     6. Pseudophakia  Z96.1  7. Combined forms of age-related cataract of left eye  H25.812     8. Glaucoma suspect of both eyes  H40.003      1,2. Moderate non-proliferative diabetic retinopathy, both eyes  - s/p IVE OD #1  (06.22.22)--sample, #2 (09.07.22), #3 (11.14.22), #4 (12.28.22), #5 (02.08.23), IVE #6 (03.15.23), #7 (04.19.23), #8 (05.24.23), #9 (06.28.23), #10 (07.27.23), #11 (08.24.23) -- IVE resistance  - s/p IVV OD #1 (09.21.23), #2 (10.19.23), #3 (11.16.23), #4 (12.14.23), #5 (01.18.24), #6 (02.21.24), #7 (03.27.24) - previously seen at [facility] by Dr. Trudie Buckler (Fax [phone]) - history of anti-VEGF injections since 2019 -- last injection IVE OD Feb/Mar 2022 -- q43mo interval - FA (11.14.22) shows OD: Focal hyperflourescence IT to fovea; no NV; OS: No NV - exam w/ scattered MA - OCT shows OD: OD: +central DME, Mild interval improvement in central cyst and surrounding cystic changes; OS: Persistent cystic changes nasal macula, persistent central VMT at 5 wks - BCVA OD 20/30 -- stable improvement s/p CEIOL; OS 20/20 -- stable. - recommend IVV OD #8 today, 05.01.24 -- f/u in 5 wks again - pt wishes to proceed - RBA of procedure discussed, questions answered - IVE informed consent obtained and re-signed 06.28.23 - IVV informed consent obtained and signed, 09.21.23 - see procedure note - f/u in 5 wks  -- DFE/OCT, possible injection  3. VMA/VMT OU  - central VMA/VMT OD may be contributing to central cyst  - OS stable  - monitor  4,5. Hypertensive retinopathy OU - discussed importance of tight BP control - monitor  6. Pseudophakia OD  - s/p CE/IOL OD (02.29.24, Dr. Wynelle Link)  - IOL in good position, doing well  - monitor  7. Mixed Cataract OS - The symptoms of cataract, surgical options, and treatments and risks were discussed with patient. - discussed diagnosis and progression - under the expert management of Dr. Wynelle Link  8. Glaucoma suspect  - C/D 0.7 OU  - IOPs okay  - under the expert management of Dr. Wynelle Link   Ophthalmic Meds Ordered this visit:  Meds ordered this encounter  Medications   faricimab-svoa (VABYSMO) 6mg /0.70mL intravitreal injection     Return in about 5 weeks  (around 11/09/2022) for f/u NPDR OU, DFE, OCT.  There are no Patient Instructions on file for this visit.  This document serves as a record of services personally performed by Karie Chimera, MD, PhD. It was created on their behalf by De Blanch, an ophthalmic technician. The creation of this record is the provider's dictation and/or activities during the visit.    Electronically signed by: De Blanch, OA, 10/06/22  12:44 PM  Karie Chimera, M.D., Ph.D. Diseases & Surgery of the Retina and Vitreous [facility]  I have reviewed the above documentation for accuracy and completeness, and I agree with the above. Karie Chimera, M.D., Ph.D. 10/06/22 12:45 PM   Abbreviations: M myopia (nearsighted); A astigmatism; H hyperopia (farsighted); P presbyopia; Mrx spectacle prescription;  CTL contact lenses; OD right eye; OS left eye; OU both eyes  XT exotropia; ET esotropia; PEK punctate epithelial keratitis; PEE punctate epithelial erosions; DES dry eye syndrome; MGD meibomian gland dysfunction; ATs artificial tears; PFAT's preservative free artificial tears; NSC nuclear sclerotic cataract; PSC posterior subcapsular cataract; ERM epi-retinal membrane; PVD posterior vitreous detachment; RD retinal detachment; DM diabetes mellitus; DR diabetic retinopathy; NPDR non-proliferative diabetic retinopathy; PDR proliferative diabetic retinopathy; CSME clinically significant macular edema; DME diabetic macular edema; dbh dot blot  hemorrhages; CWS cotton wool spot; POAG primary open angle glaucoma; C/D cup-to-disc ratio; HVF humphrey visual field; GVF goldmann visual field; OCT optical coherence tomography; IOP intraocular pressure; BRVO Branch retinal vein occlusion; CRVO central retinal vein occlusion; CRAO central retinal artery occlusion; BRAO branch retinal artery occlusion; RT retinal tear; SB scleral buckle; PPV pars plana vitrectomy; VH Vitreous hemorrhage; PRP panretinal laser  photocoagulation; IVK intravitreal kenalog; VMT vitreomacular traction; MH Macular hole;  NVD neovascularization of the disc; NVE neovascularization elsewhere; AREDS age related eye disease study; ARMD age related macular degeneration; POAG primary open angle glaucoma; EBMD epithelial/anterior basement membrane dystrophy; ACIOL anterior chamber intraocular lens; IOL intraocular lens; PCIOL posterior chamber intraocular lens; Phaco/IOL phacoemulsification with intraocular lens placement; [place] photorefractive keratectomy; LASIK laser assisted in situ keratomileusis; HTN hypertension; DM diabetes mellitus; COPD chronic obstructive pulmonary disease

## 2022-10-05 ENCOUNTER — Encounter (INDEPENDENT_AMBULATORY_CARE_PROVIDER_SITE_OTHER): Payer: Self-pay | Admitting: Ophthalmology

## 2022-10-05 ENCOUNTER — Ambulatory Visit (INDEPENDENT_AMBULATORY_CARE_PROVIDER_SITE_OTHER): Payer: Medicare HMO | Admitting: Ophthalmology

## 2022-10-05 DIAGNOSIS — Z961 Presence of intraocular lens: Secondary | ICD-10-CM

## 2022-10-05 DIAGNOSIS — H43823 Vitreomacular adhesion, bilateral: Secondary | ICD-10-CM | POA: Diagnosis not present

## 2022-10-05 DIAGNOSIS — I10 Essential (primary) hypertension: Secondary | ICD-10-CM

## 2022-10-05 DIAGNOSIS — H35033 Hypertensive retinopathy, bilateral: Secondary | ICD-10-CM | POA: Diagnosis not present

## 2022-10-05 DIAGNOSIS — Z7984 Long term (current) use of oral hypoglycemic drugs: Secondary | ICD-10-CM

## 2022-10-05 DIAGNOSIS — H40003 Preglaucoma, unspecified, bilateral: Secondary | ICD-10-CM

## 2022-10-05 DIAGNOSIS — H25812 Combined forms of age-related cataract, left eye: Secondary | ICD-10-CM

## 2022-10-05 DIAGNOSIS — E113313 Type 2 diabetes mellitus with moderate nonproliferative diabetic retinopathy with macular edema, bilateral: Secondary | ICD-10-CM

## 2022-10-05 MED ORDER — FARICIMAB-SVOA 6 MG/0.05ML IZ SOLN
6.0000 mg | INTRAVITREAL | Status: AC | PRN
  Administered 2022-10-05: 6 mg via INTRAVITREAL

## 2022-10-19 ENCOUNTER — Ambulatory Visit (INDEPENDENT_AMBULATORY_CARE_PROVIDER_SITE_OTHER): Payer: Medicare HMO | Admitting: Family Medicine

## 2022-10-19 ENCOUNTER — Telehealth: Payer: Self-pay | Admitting: Family Medicine

## 2022-10-19 ENCOUNTER — Encounter: Payer: Self-pay | Admitting: Family Medicine

## 2022-10-19 VITALS — BP 162/76 | HR 63 | Ht 64.0 in | Wt 163.0 lb

## 2022-10-19 DIAGNOSIS — I10 Essential (primary) hypertension: Secondary | ICD-10-CM | POA: Diagnosis not present

## 2022-10-19 DIAGNOSIS — I739 Peripheral vascular disease, unspecified: Secondary | ICD-10-CM

## 2022-10-19 DIAGNOSIS — E1152 Type 2 diabetes mellitus with diabetic peripheral angiopathy with gangrene: Secondary | ICD-10-CM | POA: Diagnosis not present

## 2022-10-19 DIAGNOSIS — Z7984 Long term (current) use of oral hypoglycemic drugs: Secondary | ICD-10-CM

## 2022-10-19 DIAGNOSIS — I480 Paroxysmal atrial fibrillation: Secondary | ICD-10-CM

## 2022-10-19 LAB — POCT GLYCOSYLATED HEMOGLOBIN (HGB A1C): HbA1c, POC (controlled diabetic range): 7.1 % — AB (ref 0.0–7.0)

## 2022-10-19 MED ORDER — METFORMIN HCL ER 500 MG PO TB24: 2000.0000 mg | ORAL_TABLET | ORAL | 2 refills | Status: DC

## 2022-10-19 MED ORDER — GLIPIZIDE 5 MG PO TABS: 2.5000 mg | ORAL_TABLET | Freq: Two times a day (BID) | ORAL | 1 refills | Status: DC

## 2022-10-19 NOTE — Telephone Encounter (Signed)
Patient called and stated her prescription of glipiZIDE (GLUCOTROL) 5 MG tablet [[id number]] was sent to the wrong pharmacy.   Please send to [id number] - [place], [place] - [address]

## 2022-10-19 NOTE — Assessment & Plan Note (Signed)
Blood pressure is elevated today.  Her home readings are well-controlled at this time.

## 2022-10-19 NOTE — Assessment & Plan Note (Signed)
Status post angioplasty.  She does continue to see vascular surgery.  She will remain on Plavix

## 2022-10-19 NOTE — Progress Notes (Signed)
Erin Mitchell [facility] - 77 y.o. female MRN [id number]  Date of birth: Mar 01, 1946  Subjective Chief Complaint  Patient presents with   Diabetes   Hypertension    HPI Erin Mitchell is a 77 year old female here today for follow-up visit.  She reports overall she is doing pretty well at this time.  Her blood sugars house steadily been increasing over the past several months.  Currently taking metformin 2000 mg daily.  He was on glipizide previously however had hypoglycemia with this in the past.  She continues to see cardiology for history of paroxysmal atrial fibrillation.  She is on Plavix for anticoagulation as she does also have peripheral arterial disease.  Her blood pressure is elevated in the clinic today however she brings a list of her home readings which are well-controlled.  She is tolerating current medications well at current strength.  She denies side effects at this time including chest pain, shortness of breath, palpitations, headaches or vision changes.  Feels pretty good at current strength of NP thyroid.  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 & 3rd   VENTRAL HERNIA REPAIR N/A 12/28/2020   Procedure: LAPAROSCOPIC VENTRAL INCISIONAL HERNIA REPAIR WITH MESH;  Surgeon: Darnell Level, MD;  Location: [facility];  Service: General;  Laterality: N/A;    Social History   Socioeconomic History   Marital status: Married    Spouse name: Not on file   Number of children: 7   Years of education:  Not on file   Highest education level: Not on file  Occupational History   Occupation: Retired  Tobacco Use   Smoking status: Former    Packs/day: 0.25    Years: 1.00    Additional pack years: 0.00    Total pack years: 0.25    Types: Cigarettes    Quit date: 06/06/1970    Years since quitting: 52.4   Smokeless tobacco: Never  Vaping Use   Vaping Use: Never used  Substance and Sexual Activity   Alcohol use: Never   Drug use: Never   Sexual activity: Not Currently    Partners: Male  Other Topics Concern   Not on file  Social History Narrative   Not on file   Social Determinants of Health   Financial Resource Strain: Not on file  Food Insecurity: Not on file  Transportation Needs: Not on file  Physical Activity: Not on file  Stress: Not on file  Social Connections: Not on file    Family History  Problem Relation Age of Onset   Hypertension Father    Diabetes Brother    Emphysema Mother     Health Maintenance  Topic Date Due   OPHTHALMOLOGY EXAM  09/23/2022   Medicare Annual Wellness (AWV)  02/19/2023 (Originally 1946-05-10)   Zoster Vaccines- Shingrix (1 of 2) 02/19/2023 (Originally 04/12/1998)   COVID-19 Vaccine (  3 - 2023-24 season) 03/05/2023 (Originally 02/04/2022)   INFLUENZA VACCINE  01/05/2023   Diabetic kidney evaluation - eGFR measurement  04/21/2023   Diabetic kidney evaluation - Urine ACR  04/21/2023   HEMOGLOBIN A1C  04/21/2023   FOOT EXAM  10/19/2023   DTaP/Tdap/Td (2 - Td or Tdap) 08/15/2029   Pneumonia Vaccine 51+ Years old  Completed   HPV VACCINES  Aged Out   MAMMOGRAM  Discontinued   DEXA SCAN  Discontinued   COLONOSCOPY (Pts 45-23yrs Insurance coverage will need to be confirmed)  Discontinued   Hepatitis C Screening  Discontinued      ----------------------------------------------------------------------------------------------------------------------------------------------------------------------------------------------------------------- Physical Exam BP (!) 162/76 (BP Location: Left Arm, Patient Position: Sitting, Cuff Size: Normal)   Pulse 63   Ht 5\' 4"  (1.626 m)   Wt 163 lb (73.9 kg)   SpO2 100%   BMI 27.98 kg/m   Physical Exam Constitutional:      Appearance: Normal appearance.  HENT:     Head: Normocephalic and atraumatic.  Eyes:     General: No scleral icterus. Cardiovascular:     Rate and Rhythm: Normal rate and regular rhythm.  Pulmonary:     Effort: Pulmonary effort is normal.     Breath sounds: Normal breath sounds.  Musculoskeletal:     Cervical back: Neck supple.  Neurological:     Mental Status: She is alert.  Psychiatric:        Mood and Affect: Mood normal.        Behavior: Behavior normal.     ------------------------------------------------------------------------------------------------------------------------------------------------------------------------------------------------------------------- Assessment and Plan  Hypertension Blood pressure is elevated today.  Her home readings are well-controlled at this time.  PAD (peripheral artery disease) (HCC) Status post angioplasty.  She does continue to see vascular surgery.  She will remain on Plavix  Type 2 diabetes mellitus with diabetic peripheral angiopathy and gangrene, without long-term current use of insulin (HCC) Her diabetes continues to worsen slightly.  Adding glipizide at 2.5 mg twice daily.  Continue metformin at current strength.  PAF (paroxysmal atrial fibrillation) (HCC) Rate controlled at this time.  Denies new or worsening symptoms.  She will continue to see cardiology for management.   Meds ordered this encounter  Medications   glipiZIDE (GLUCOTROL) 5 MG tablet    Sig: Take 0.5 tablets (2.5 mg  total) by mouth 2 (two) times daily before a meal.    Dispense:  90 tablet    Refill:  1    Return in about 3 months (around 01/19/2023) for F/u T2DM.    This visit occurred during the SARS-CoV-2 public health emergency.  Safety protocols were in place, including screening questions prior to the visit, additional usage of staff PPE, and extensive cleaning of exam room while observing appropriate contact time as indicated for disinfecting solutions.

## 2022-10-19 NOTE — Assessment & Plan Note (Signed)
Her diabetes continues to worsen slightly.  Adding glipizide at 2.5 mg twice daily.  Continue metformin at current strength.

## 2022-10-19 NOTE — Assessment & Plan Note (Signed)
Rate controlled at this time.  Denies new or worsening symptoms.  She will continue to see cardiology for management.

## 2022-10-21 ENCOUNTER — Telehealth: Payer: Self-pay | Admitting: Family Medicine

## 2022-10-21 NOTE — Telephone Encounter (Signed)
Patient called stated her blood sugar has lower since she was in the office Wednesday Glipzide 5mg  dosage half tab in the morning and half tab at night she wanted to know if she needs to continue to take it please advise

## 2022-10-21 NOTE — Telephone Encounter (Signed)
82 is ok and considered normal.  If blood sugar drops below 70 please have her hold the glipizide.

## 2022-10-21 NOTE — Telephone Encounter (Signed)
Patient called back the lowest number for her blood sugar was 82

## 2022-10-21 NOTE — Telephone Encounter (Signed)
Patient advised.

## 2022-10-23 ENCOUNTER — Other Ambulatory Visit: Payer: Self-pay | Admitting: Family Medicine

## 2022-10-23 DIAGNOSIS — E1152 Type 2 diabetes mellitus with diabetic peripheral angiopathy with gangrene: Secondary | ICD-10-CM

## 2022-11-03 NOTE — Progress Notes (Signed)
[facility] - Clinic Note  11/09/2022     CHIEF COMPLAINT Patient presents for Retina Follow Up  HISTORY OF PRESENT ILLNESS: Erin Mitchell is a 77 y.o. female who presents to the clinic today for:   HPI     Retina Follow Up   Patient presents with  Diabetic Retinopathy.  In both eyes.  This started weeks ago.  Severity is moderate.  Duration of 5 weeks.  Since onset it is stable.  I, the attending physician,  performed the HPI with the patient and updated documentation appropriately.        Comments   Patient states that the vision has improved in the right eye with cataract surgery. She is not using eye drops at this time. Her blood sugar was 122.       Last edited by Rennis Chris, MD on 11/09/2022 11:52 AM.    Pt got a new glasses rx, she is trying to get used to it  Referring physician: Everrett Coombe, DO [address] [address]  [address] [place],  [place] [address]  HISTORICAL INFORMATION:   Selected notes from the MEDICAL RECORD NUMBER Referred by Dr. Everrett Coombe for DM exam LEE:  Ocular Hx- PMH- DM, HTN, afib, kidney disease, last a1c was 6.8 on 05.05.22    CURRENT MEDICATIONS: No current outpatient medications on file. (Ophthalmic Drugs)   No current facility-administered medications for this visit. (Ophthalmic Drugs)   Current Outpatient Medications (Other)  Medication Sig   Alcohol Swabs (DROPSAFE ALCOHOL PREP) 70 % PADS USE AS DIRECTED.   Blood Glucose Monitoring Suppl (TRUE METRIX METER) w/Device KIT Use as directed to test blood glucose daily   Cholecalciferol (VITAMIN D-1000 MAX ST PO) Take 2,000 Units by mouth daily.   clopidogrel (PLAVIX) 75 MG tablet TAKE 1 TABLET EVERY DAY   glipiZIDE (GLUCOTROL) 5 MG tablet Take 0.5 tablets (2.5 mg total) by mouth 2 (two) times daily before a meal.   loperamide (IMODIUM A-D) 2 MG tablet Take 2-4 mg by mouth 4 (four) times daily as needed for diarrhea or loose stools.   losartan  (COZAAR) 100 MG tablet TAKE 1 TABLET EVERY DAY   Melatonin 10 MG CAPS Take 10 mg by mouth at bedtime.   metFORMIN (GLUCOPHAGE-XR) 500 MG 24 hr tablet Take 4 tablets (2,000 mg total) by mouth as directed. 1000 mg in the morning and 1000 mg at night   metoprolol tartrate (LOPRESSOR) 25 MG tablet TAKE 1 TABLET TWICE DAILY WITH FOOD   NP THYROID 90 MG tablet TAKE 1 TABLET (90 MG TOTAL) BY MOUTH DAILY. NO REFILLS. NEEDS LABS.   Probiotic Product (ALIGN) 4 MG CAPS Take 4 mg by mouth daily.   rosuvastatin (CRESTOR) 40 MG tablet TAKE 1 TABLET EVERY DAY   TRUE METRIX BLOOD GLUCOSE TEST test strip USE AS INSTRUCTED   TRUEplus Lancets 33G MISC Use as directed daily.   Zinc Sulfate (ZINC 15 PO) Take by mouth.   No current facility-administered medications for this visit. (Other)   REVIEW OF SYSTEMS: ROS   Positive for: Endocrine, Cardiovascular, Eyes Negative for: Constitutional, Gastrointestinal, Neurological, Skin, Genitourinary, Musculoskeletal, HENT, Respiratory, Psychiatric, Allergic/Imm, Heme/Lymph Last edited by Julieanne Cotton, COT on 11/09/2022  8:48 AM.     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   VENTRAL HERNIA REPAIR N/A 12/28/2020   Procedure: LAPAROSCOPIC VENTRAL INCISIONAL HERNIA REPAIR WITH MESH;  Surgeon: Darnell Level, MD;  Location: [facility];  Service: General;  Laterality: N/A;   FAMILY HISTORY Family History  Problem Relation Age of Onset   Hypertension Father    Diabetes Brother    Emphysema Mother    SOCIAL  HISTORY Social History   Tobacco Use   Smoking status: Former    Packs/day: 0.25    Years: 1.00    Additional pack years: 0.00    Total pack years: 0.25    Types: Cigarettes    Quit date: 06/06/1970    Years since quitting: 52.4   Smokeless tobacco: Never  Vaping Use   Vaping Use: Never used  Substance Use Topics   Alcohol use: Never   Drug use: Never       OPHTHALMIC EXAM: Base Eye Exam     Visual Acuity (Snellen - Linear)       Right Left   Dist cc 20/30 20/20   Dist ph cc NI     Correction: Glasses         Tonometry (Tonopen, 8:54 AM)       Right Left   Pressure 12 12         Pupils       Dark Light Shape React APD   Right 2 1 Round Minimal None   Left 2 1 Round Minimal None         Visual Fields       Left Right    Full Full         Extraocular Movement       Right Left    Full, Ortho Full, Ortho         Neuro/Psych     Oriented x3: Yes   Mood/Affect: Normal         Dilation     Both eyes: 1.0% Mydriacyl, 2.5% Phenylephrine @ 8:51 AM           Slit Lamp and Fundus Exam     Slit Lamp Exam       Right Left   Lids/Lashes Dermatochalasis - upper lid, Meibomian gland dysfunction Dermatochalasis - upper lid, Meibomian gland dysfunction   Conjunctiva/Sclera White and quiet White and quiet   Cornea arcus, trace tear film debris, well healed cataract wound arcus, trace PEE, mild tear film debris   Anterior Chamber Moderate depth, clear, very narrow temporal angles Moderate depth, clear, very narrow temporal angles   Iris Round and moderately dilated, mild anterior bowing Round and moderately dilated, mild anterior bowing   Lens PC IOL in good position 2-3+ Nuclear sclerosis with brunescence, 2-3+ Cortical cataract   Anterior Vitreous Vitreous syneresis, Posterior vitreous detachment, trace VH settled inferiorly Vitreous syneresis, Posterior vitreous detachment, vitreous condensations         Fundus Exam       Right Left    Disc Trace pallor, Sharp rim, +cupping Pink and Sharp, +cupping   C/D Ratio 0.7 0.7  Macula Blunted foveal reflex, central cystic changes / edema -- slightly improved, RPE mottling, fine drusen, pigment clumping, cluster of MA temporal to fovea and temporal macula -- improved, mild ERM, punctate central exudation Flat, Blunted foveal reflex, RPE mottling, focal MA temp mac, no exudates, cystic changes nasal macula -- stably improved   Vessels attenuated, Tortuous attenuated, Tortuous   Periphery Attached, mild reticular degeneration, mild MA Attached, mild reticular degeneration, scattered MA, scattered DBH greatest posteriorly           Refraction     Wearing Rx       Sphere Cylinder Axis Add   Right -0.50 +0.75 166 +2.75   Left +1.75 Sphere  +2.75    Type: Bifocal           IMAGING AND PROCEDURES  Imaging and Procedures for 11/09/2022  OCT, Retina - OU - Both Eyes       Right Eye Quality was good. Central Foveal Thickness: 331. Progression has improved. Findings include no SRF, abnormal foveal contour, intraretinal hyper-reflective material, intraretinal fluid, vitreomacular adhesion (Mild interval improvement in central cyst and surrounding cystic changes).   Left Eye Quality was good. Central Foveal Thickness: 265. Progression has been stable. Findings include no SRF, abnormal foveal contour, intraretinal fluid, vitreous traction, vitreomacular adhesion (Persistent cystic changes nasal macula, persistent central VMT).   Notes *Images captured and stored on drive  Diagnosis / Impression:  OD: +central DME, Mild interval improvement in central cyst and surrounding cystic changes OS: tr persistent cystic changes nasal macula, persistent central VMT  Clinical management:  See below  Abbreviations: NFP - Normal foveal profile. CME - cystoid macular edema. PED - pigment epithelial detachment. IRF - intraretinal fluid. SRF - subretinal fluid. EZ - ellipsoid zone. ERM -  epiretinal membrane. ORA - outer retinal atrophy. ORT - outer retinal tubulation. SRHM - subretinal hyper-reflective material. IRHM - intraretinal hyper-reflective material      Intravitreal Injection, Pharmacologic Agent - OD - Right Eye       Time Out 11/09/2022. 9:32 AM. Confirmed correct patient, procedure, site, and patient consented.   Anesthesia Topical anesthesia was used. Anesthetic medications included Lidocaine 2%, Proparacaine 0.5%.   Procedure Preparation included 5% betadine to ocular surface, eyelid speculum. A (32g) needle was used.   Injection: 6 mg faricimab-svoa 6 MG/0.05ML   Route: Intravitreal, Site: Right Eye   NDC: O8010301, Lot: [id number], Expiration date: 09/03/2024, Waste: 0 mL   Post-op Post injection exam found visual acuity of at least counting fingers. The patient tolerated the procedure well. There were no complications. The patient received written and verbal post procedure care education. Post injection medications were not given.            ASSESSMENT/PLAN:    ICD-10-CM   1. Moderate nonproliferative diabetic retinopathy of both eyes with macular edema associated with type 2 diabetes mellitus (HCC)  E11.3313 OCT, Retina - OU - Both Eyes    Intravitreal Injection, Pharmacologic Agent - OD - Right Eye    faricimab-svoa (VABYSMO) 6mg /0.46mL intravitreal injection    CANCELED: Intravitreal Injection, Pharmacologic Agent - OS - Left Eye    2. Long term (current) use of oral hypoglycemic drugs  Z79.84     3. 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 Essential hypertension  I10     5. Hypertensive retinopathy of both eyes  H35.033     6. Pseudophakia  Z96.1     7. Combined forms of age-related  cataract of left eye  H25.812     8. Glaucoma suspect of both eyes  H40.003       1,2. Moderate non-proliferative diabetic retinopathy, both eyes  - s/p IVE OD #1 (06.22.22)--sample, #2 (09.07.22), #3 (11.14.22), #4 (12.28.22), #5  (02.08.23), IVE #6 (03.15.23), #7 (04.19.23), #8 (05.24.23), #9 (06.28.23), #10 (07.27.23), #11 (08.24.23) -- IVE resistance  - s/p IVV OD #1 (09.21.23), #2 (10.19.23), #3 (11.16.23), #4 (12.14.23), #5 (01.18.24), #6 (02.21.24), #7 (03.27.24), #8 (05.01.24) - previously seen at [facility] by Dr. Trudie Buckler (Fax [phone]) - history of anti-VEGF injections since 2019 -- last injection IVE OD Feb/Mar 2022 -- q44mo interval - FA (11.14.22) shows OD: Focal hyperflourescence IT to fovea; no NV; OS: No NV - exam w/ scattered MA - OCT shows OD: +central DME, Mild interval improvement in central cyst and surrounding cystic changes; OS: tr persistent cystic changes nasal macula, persistent central VMT at 5 wks - BCVA OD 20/30 -- stable improvement s/p CEIOL; OS 20/20 -- stable. - recommend IVV OD #9 today, 06.05.24 -- f/u in 5 wks again - pt wishes to proceed - RBA of procedure discussed, questions answered - IVE informed consent obtained and re-signed 06.28.23 - IVV informed consent obtained and signed, 09.21.23 - see procedure note - f/u in 5 wks  -- DFE/OCT, possible injection  3. VMA/VMT OU  - central VMA/VMT OD likely contributing to central cyst  - OS stable  - monitor  4,5. Hypertensive retinopathy OU - discussed importance of tight BP control - monitor  6. Pseudophakia OD  - s/p CE/IOL OD (02.29.24, Dr. Wynelle Link)  - IOL in good position, doing well  - monitor  7. Mixed Cataract OS - The symptoms of cataract, surgical options, and treatments and risks were discussed with patient. - discussed diagnosis and progression - under the expert management of Dr. Wynelle Link  8. Glaucoma suspect  - C/D 0.7 OU  - IOPs okay  - under the expert management of Dr. Wynelle Link   Ophthalmic Meds Ordered this visit:  Meds ordered this encounter  Medications   faricimab-svoa (VABYSMO) 6mg /0.41mL intravitreal injection     Return in about 5 weeks (around 12/14/2022) for f/u NPDR OU, DFE,  OCT.  There are no Patient Instructions on file for this visit.  This document serves as a record of services personally performed by Karie Chimera, MD, PhD. It was created on their behalf by De Blanch, an ophthalmic technician. The creation of this record is the provider's dictation and/or activities during the visit.    Electronically signed by: De Blanch, OA, 11/09/22  11:56 AM  This document serves as a record of services personally performed by Karie Chimera, MD, PhD. It was created on their behalf by Glee Arvin. Manson Passey, OA an ophthalmic technician. The creation of this record is the provider's dictation and/or activities during the visit.    Electronically signed by: Glee Arvin. Manson Passey, [place] 06.05.2024 11:56 AM  Karie Chimera, M.D., Ph.D. Diseases & Surgery of the Retina and Vitreous [facility]  I have reviewed the above documentation for accuracy and completeness, and I agree with the above. Karie Chimera, M.D., Ph.D. 11/09/22 11:56 AM   Abbreviations: M myopia (nearsighted); A astigmatism; H hyperopia (farsighted); P presbyopia; Mrx spectacle prescription;  CTL contact lenses; OD right eye; OS left eye; OU both eyes  XT exotropia; ET esotropia; PEK punctate epithelial keratitis; PEE punctate epithelial erosions; DES dry eye syndrome; MGD  meibomian gland dysfunction; ATs artificial tears; PFAT's preservative free artificial tears; NSC nuclear sclerotic cataract; PSC posterior subcapsular cataract; ERM epi-retinal membrane; PVD posterior vitreous detachment; RD retinal detachment; DM diabetes mellitus; DR diabetic retinopathy; NPDR non-proliferative diabetic retinopathy; PDR proliferative diabetic retinopathy; CSME clinically significant macular edema; DME diabetic macular edema; dbh dot blot hemorrhages; CWS cotton wool spot; POAG primary open angle glaucoma; C/D cup-to-disc ratio; HVF humphrey visual field; GVF goldmann visual field; OCT optical  coherence tomography; IOP intraocular pressure; BRVO Branch retinal vein occlusion; CRVO central retinal vein occlusion; CRAO central retinal artery occlusion; BRAO branch retinal artery occlusion; RT retinal tear; SB scleral buckle; PPV pars plana vitrectomy; VH Vitreous hemorrhage; PRP panretinal laser photocoagulation; IVK intravitreal kenalog; VMT vitreomacular traction; MH Macular hole;  NVD neovascularization of the disc; NVE neovascularization elsewhere; AREDS age related eye disease study; ARMD age related macular degeneration; POAG primary open angle glaucoma; EBMD epithelial/anterior basement membrane dystrophy; ACIOL anterior chamber intraocular lens; IOL intraocular lens; PCIOL posterior chamber intraocular lens; Phaco/IOL phacoemulsification with intraocular lens placement; PRK photorefractive keratectomy; LASIK laser assisted in situ keratomileusis; HTN hypertension; DM diabetes mellitus; COPD chronic obstructive pulmonary disease

## 2022-11-09 ENCOUNTER — Encounter (INDEPENDENT_AMBULATORY_CARE_PROVIDER_SITE_OTHER): Payer: Self-pay | Admitting: Ophthalmology

## 2022-11-09 ENCOUNTER — Ambulatory Visit (INDEPENDENT_AMBULATORY_CARE_PROVIDER_SITE_OTHER): Payer: Medicare HMO | Admitting: Ophthalmology

## 2022-11-09 DIAGNOSIS — H25812 Combined forms of age-related cataract, left eye: Secondary | ICD-10-CM | POA: Diagnosis not present

## 2022-11-09 DIAGNOSIS — Z961 Presence of intraocular lens: Secondary | ICD-10-CM | POA: Diagnosis not present

## 2022-11-09 DIAGNOSIS — E113313 Type 2 diabetes mellitus with moderate nonproliferative diabetic retinopathy with macular edema, bilateral: Secondary | ICD-10-CM

## 2022-11-09 DIAGNOSIS — H35033 Hypertensive retinopathy, bilateral: Secondary | ICD-10-CM

## 2022-11-09 DIAGNOSIS — I10 Essential (primary) hypertension: Secondary | ICD-10-CM | POA: Diagnosis not present

## 2022-11-09 DIAGNOSIS — Z7984 Long term (current) use of oral hypoglycemic drugs: Secondary | ICD-10-CM

## 2022-11-09 DIAGNOSIS — H43823 Vitreomacular adhesion, bilateral: Secondary | ICD-10-CM | POA: Diagnosis not present

## 2022-11-09 DIAGNOSIS — H40003 Preglaucoma, unspecified, bilateral: Secondary | ICD-10-CM

## 2022-11-09 MED ORDER — FARICIMAB-SVOA 6 MG/0.05ML IZ SOLN
6.0000 mg | INTRAVITREAL | Status: AC | PRN
  Administered 2022-11-09: 6 mg via INTRAVITREAL

## 2022-11-11 ENCOUNTER — Encounter: Payer: Self-pay | Admitting: Podiatry

## 2022-11-11 ENCOUNTER — Ambulatory Visit: Payer: Medicare HMO | Admitting: Podiatry

## 2022-11-11 DIAGNOSIS — B351 Tinea unguium: Secondary | ICD-10-CM | POA: Diagnosis not present

## 2022-11-11 DIAGNOSIS — M79674 Pain in right toe(s): Secondary | ICD-10-CM | POA: Diagnosis not present

## 2022-11-11 DIAGNOSIS — I739 Peripheral vascular disease, unspecified: Secondary | ICD-10-CM | POA: Diagnosis not present

## 2022-11-11 DIAGNOSIS — E1152 Type 2 diabetes mellitus with diabetic peripheral angiopathy with gangrene: Secondary | ICD-10-CM | POA: Diagnosis not present

## 2022-11-11 DIAGNOSIS — M79675 Pain in left toe(s): Secondary | ICD-10-CM

## 2022-11-11 NOTE — Progress Notes (Signed)
  Subjective:  Patient ID: Erin Mitchell, female    DOB: Feb 10, 1946,   MRN: [id number]  Chief Complaint  Patient presents with   Nail Problem     Routine foot care    77 y.o. female presents for concern of thickened elongated and painful nails that are difficult to trim. Requesting to have them trimmed today. Relates burning and tingling in their feet. Patient is diabetic and last A1c was  Lab Results  Component Value Date   HGBA1C 7.1 (A) 10/19/2022   .   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 Also has a history of PAD . Denies any other pedal complaints. Denies n/v/f/c.   PCP: Everrett Coombe MD   Past Medical History:  Diagnosis Date   Atrial fibrillation (HCC)    on Plavix   Cataract    Diabetes (HCC)    Diabetic retinopathy (HCC)    Exposure to Agent Orange    Hypertension    Hypertensive retinopathy    hypothyroidism    Hypothyroidism    Kidney disease     Objective:  Physical Exam: Vascular: DP/PT pulses 2/4 bilateral. CFT <3 seconds. Absent hair growth on digits. No edema.  Skin. No lacerations or abrasions bilateral feet. Nails 1-5 are thickened discolored and elongated with subungual debris.  Right great toe hhealed well no open areas.  Musculoskeletal: MMT 5/5 bilateral lower extremities in DF, PF, Inversion and Eversion. Deceased ROM in DF of ankle joint. Right second and third digits amputated. Severe bunion of right foot.  Neurological: Sensation intact to light touch.   Assessment:   1. Dermatophytosis of nail   2. Pain in toes of both feet   3. PAD (peripheral artery disease) (HCC)   4. Type 2 diabetes mellitus with diabetic peripheral angiopathy and gangrene, without long-term current use of insulin (HCC)           Plan:  Patient was evaluated and treated and all questions answered. -Discussed and educated patient on diabetic foot care, especially with  regards to the  vascular, neurological and musculoskeletal systems.  -Stressed the importance of good glycemic control and the detriment of not  controlling glucose levels in relation to the foot. -Discussed supportive shoes at all times and checking feet regularly.  -Mechanically debrided all nails 1-5 bilateral using sterile nail nipper and filed with dremel without incident  -Diabetic shoes dispensed today.  -Answered all patient questions -Patient to return  in 3 months for at risk foot care -Patient advised to call the office if any problems or questions arise in the meantime.    Louann Sjogren, DPM

## 2022-11-20 ENCOUNTER — Other Ambulatory Visit: Payer: Self-pay | Admitting: Family Medicine

## 2022-11-20 DIAGNOSIS — E1152 Type 2 diabetes mellitus with diabetic peripheral angiopathy with gangrene: Secondary | ICD-10-CM

## 2022-11-21 ENCOUNTER — Other Ambulatory Visit: Payer: Self-pay | Admitting: *Deleted

## 2022-11-21 DIAGNOSIS — I739 Peripheral vascular disease, unspecified: Secondary | ICD-10-CM

## 2022-11-22 ENCOUNTER — Telehealth: Payer: Self-pay | Admitting: Family Medicine

## 2022-11-22 NOTE — Telephone Encounter (Signed)
Message acknowledged by CMA & Dr. Ashley Royalty.

## 2022-11-22 NOTE — Telephone Encounter (Signed)
Patient wanted to let you know her blood sugar has dropped down to 63 and she stopped her glipizide 5mg  and metformin is working fine

## 2022-11-29 NOTE — Progress Notes (Signed)
[facility] - Clinic Note  12/13/2022     CHIEF COMPLAINT Patient presents for Retina Follow Up  HISTORY OF PRESENT ILLNESS: Erin Mitchell is a 77 y.o. female who presents to the clinic today for:   HPI     Retina Follow Up   Patient presents with  Diabetic Retinopathy.  In both eyes.  This started 5 weeks ago.  I, the attending physician,  performed the HPI with the patient and updated documentation appropriately.        Comments   Patient here for 5 weeks retina follow up for NPDR OU. Patient states vision doing ok. No eye pain.       Last edited by Rennis Chris, MD on 12/13/2022  9:45 PM.      Referring physician: Everrett Coombe, DO [address]  [address] [place],  [place] [address]  HISTORICAL INFORMATION:   Selected notes from the MEDICAL RECORD NUMBER Referred by Dr. Everrett Coombe for DM exam LEE:  Ocular Hx- PMH- DM, HTN, afib, kidney disease, last a1c was 6.8 on 05.05.22    CURRENT MEDICATIONS: No current outpatient medications on file. (Ophthalmic Drugs)   No current facility-administered medications for this visit. (Ophthalmic Drugs)   Current Outpatient Medications (Other)  Medication Sig   Alcohol Swabs (DROPSAFE ALCOHOL PREP) 70 % PADS USE AS DIRECTED.   Blood Glucose Monitoring Suppl (TRUE METRIX METER) w/Device KIT Use as directed to test blood glucose daily   Cholecalciferol (VITAMIN D-1000 MAX ST PO) Take 2,000 Units by mouth daily.   clopidogrel (PLAVIX) 75 MG tablet TAKE 1 TABLET EVERY DAY   glipiZIDE (GLUCOTROL) 5 MG tablet Take 0.5 tablets (2.5 mg total) by mouth 2 (two) times daily before a meal.   loperamide (IMODIUM A-D) 2 MG tablet Take 2-4 mg by mouth 4 (four) times daily as needed for diarrhea or loose stools.   losartan (COZAAR) 100 MG tablet TAKE 1 TABLET EVERY DAY   Melatonin 10 MG CAPS Take 10 mg by mouth at bedtime.   metFORMIN (GLUCOPHAGE-XR) 500 MG 24 hr tablet TAKE 2 TABLETS (1,000 MG  TOTAL) BY MOUTH DAILY WITH BREAKFAST. (Patient taking differently: Take 2,000 mg by mouth daily with breakfast. Patient taking 2 tabs in am and 2 tabs in pm)   metoprolol tartrate (LOPRESSOR) 25 MG tablet TAKE 1 TABLET TWICE DAILY WITH FOOD   NP THYROID 90 MG tablet Take 1 tablet (90 mg total) by mouth daily.   Probiotic Product (ALIGN) 4 MG CAPS Take 4 mg by mouth daily.   rosuvastatin (CRESTOR) 40 MG tablet TAKE 1 TABLET EVERY DAY   TRUE METRIX BLOOD GLUCOSE TEST test strip USE AS INSTRUCTED   TRUEplus Lancets 33G MISC Use as directed daily.   Zinc Sulfate (ZINC 15 PO) Take by mouth.   No current facility-administered medications for this visit. (Other)   REVIEW OF SYSTEMS: ROS   Positive for: Endocrine, Cardiovascular, Eyes Negative for: Constitutional, Gastrointestinal, Neurological, Skin, Genitourinary, Musculoskeletal, HENT, Respiratory, Psychiatric, Allergic/Imm, Heme/Lymph Last edited by Laddie Aquas, COA on 12/13/2022  9:18 AM.      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   VENTRAL HERNIA REPAIR N/A 12/28/2020   Procedure: LAPAROSCOPIC VENTRAL INCISIONAL HERNIA REPAIR WITH MESH;  Surgeon: Darnell Level, MD;  Location: [facility];  Service: General;  Laterality: N/A;   FAMILY HISTORY Family History  Problem Relation Age of Onset   Hypertension Father    Diabetes Brother    Emphysema Mother    SOCIAL HISTORY Social History   Tobacco Use   Smoking status: Former    Current packs/day: 0.00    Average packs/day: 0.3  packs/day for 1 year (0.3 ttl pk-yrs)    Types: Cigarettes    Start date: 06/06/1969    Quit date: 06/06/1970    Years since quitting: 52.5   Smokeless tobacco: Never  Vaping Use   Vaping status: Never Used  Substance Use Topics   Alcohol use: Never   Drug use: Never       OPHTHALMIC EXAM: Base Eye Exam     Visual Acuity (Snellen - Linear)       Right Left   Dist cc 20/30 -2 20/20    Correction: Glasses         Tonometry (Tonopen, 9:16 AM)       Right Left   Pressure 09 13         Pupils       Dark Light Shape React APD   Right 2 1 Round Minimal None   Left 2 1 Round Minimal None         Visual Fields (Counting fingers)       Left Right    Full Full         Extraocular Movement       Right Left    Full, Ortho Full, Ortho         Neuro/Psych     Oriented x3: Yes   Mood/Affect: Normal         Dilation     Both eyes: 1.0% Mydriacyl, 2.5% Phenylephrine @ 9:16 AM           Slit Lamp and Fundus Exam     Slit Lamp Exam       Right Left   Lids/Lashes Dermatochalasis - upper lid, Meibomian gland dysfunction Dermatochalasis - upper lid, Meibomian gland dysfunction   Conjunctiva/Sclera White and quiet White and quiet   Cornea arcus, trace tear film debris, well healed cataract wound arcus, trace PEE, mild tear film debris   Anterior Chamber Moderate depth, clear, very narrow temporal angles Moderate depth, clear, very narrow temporal angles   Iris Round and moderately dilated, mild anterior bowing Round and moderately dilated, mild anterior bowing   Lens PC IOL in good position 2-3+ Nuclear sclerosis with brunescence, 2-3+ Cortical cataract   Anterior Vitreous Vitreous syneresis, Posterior vitreous detachment, trace VH settled inferiorly -- resolved, +vitreous condensations Vitreous syneresis, Posterior vitreous detachment, vitreous condensations         Fundus Exam       Right Left   Disc Trace pallor, Sharp rim, +cupping Pink and  Sharp, +cupping   C/D Ratio 0.7 0.7   Macula Blunted foveal reflex, central cystic changes / edema -- slightly improved, RPE mottling, fine drusen, pigment clumping, cluster of MA temporal to fovea and temporal macula --  improved, mild ERM, punctate central exudation Flat, Blunted foveal reflex, RPE mottling, focal MA temp mac, no exudates, cystic changes nasal macula -- stably improved   Vessels attenuated, Tortuous attenuated, Tortuous   Periphery Attached, mild reticular degeneration, mild MA Attached, mild reticular degeneration, scattered MA, scattered DBH greatest posteriorly           Refraction     Wearing Rx       Sphere Cylinder Axis Add   Right -0.50 +0.75 166 +2.75   Left +1.75 Sphere  +2.75    Type: Bifocal           IMAGING AND PROCEDURES  Imaging and Procedures for 12/13/2022  OCT, Retina - OU - Both Eyes       Right Eye Quality was good. Central Foveal Thickness: 320. Progression has improved. Findings include no SRF, abnormal foveal contour, intraretinal hyper-reflective material, intraretinal fluid, vitreomacular adhesion (Mild interval improvement in central cyst and surrounding cystic changes).   Left Eye Quality was good. Central Foveal Thickness: 268. Progression has been stable. Findings include no SRF, abnormal foveal contour, intraretinal fluid, vitreous traction, vitreomacular adhesion (Persistent cystic changes nasal macula, persistent central VMT).   Notes *Images captured and stored on drive  Diagnosis / Impression:  OD: +central DME, Mild interval improvement in central cyst and surrounding cystic changes OS: tr persistent cystic changes nasal macula, persistent central VMT  Clinical management:  See below  Abbreviations: NFP - Normal foveal profile. CME - cystoid macular edema. PED - pigment epithelial detachment. IRF - intraretinal fluid. SRF - subretinal fluid. EZ - ellipsoid zone. ERM - epiretinal membrane. ORA - outer retinal atrophy.  ORT - outer retinal tubulation. SRHM - subretinal hyper-reflective material. IRHM - intraretinal hyper-reflective material      Intravitreal Injection, Pharmacologic Agent - OD - Right Eye       Time Out 12/13/2022. 9:57 AM. Confirmed correct patient, procedure, site, and patient consented.   Anesthesia Topical anesthesia was used. Anesthetic medications included Lidocaine 2%, Proparacaine 0.5%.   Procedure Preparation included 5% betadine to ocular surface, eyelid speculum. A (32g) needle was used.   Injection: 6 mg faricimab-svoa 6 MG/0.05ML   Route: Intravitreal, Site: Right Eye   NDC: O8010301, Lot: [id number], Expiration date: 12/03/2024, Waste: 0 mL   Post-op Post injection exam found visual acuity of at least counting fingers. The patient tolerated the procedure well. There were no complications. The patient received written and verbal post procedure care education. Post injection medications were not given.             ASSESSMENT/PLAN:    ICD-10-CM   1. Moderate nonproliferative diabetic retinopathy of both eyes with macular edema associated with type 2 diabetes mellitus (HCC)  E11.3313 OCT, Retina - OU - Both Eyes    Intravitreal Injection, Pharmacologic Agent - OD - Right Eye    faricimab-svoa (VABYSMO) 6mg /0.18mL intravitreal injection    2. Long term (current) use of oral hypoglycemic drugs  Z79.84     3. 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 Essential hypertension  I10     5. Hypertensive retinopathy of both eyes  H35.033     6. Pseudophakia  Z96.1      1,2. Moderate non-proliferative diabetic retinopathy, both eyes  - s/p IVE OD #1 (06.22.22)--sample, #2 (09.07.22), #3 (11.14.22), #4 (12.28.22), #5 (02.08.23), IVE #6 (03.15.23), #7 (04.19.23), #8 (05.24.23), #9 (06.28.23), #10 (07.27.23), #11 (08.24.23) -- IVE resistance  - s/p IVV OD #1 (  09.21.23), #2 (10.19.23), #3 (11.16.23), #4 (12.14.23), #5 (01.18.24), #6 (02.21.24), #7 (03.27.24),  #8 (05.01.24), #9 (06.05.24) - previously seen at [facility] by Dr. Trudie Buckler (Fax [phone]) - history of anti-VEGF injections since 2019 -- last injection IVE OD Feb/Mar 2022 -- q69mo interval - FA (11.14.22) shows OD: Focal hyperflourescence IT to fovea; no NV; OS: No NV - exam w/ scattered MA - OCT shows OD: +central DME, Mild interval improvement in central cyst and surrounding cystic changes; OS: tr persistent cystic changes nasal macula, persistent central VMT at 5 wks - BCVA OD 20/30 -- stable improvement s/p CEIOL; OS 20/20 -- stable. - recommend IVV OD #10 today, 07.09.24 -- f/u in 5 wks again - pt wishes to proceed - RBA of procedure discussed, questions answered - IVV informed consent obtained and signed, 09.21.23 - see procedure note - f/u in 5 wks  -- DFE/OCT, possible injection  3. VMA/VMT OU  - central VMA/VMT OD likely contributing to central cyst  - OS stable  - monitor  4,5. Hypertensive retinopathy OU - discussed importance of tight BP control - monitor  6. Pseudophakia OD  - s/p CE/IOL OD (02.29.24, Dr. Wynelle Link)  - IOL in good position, doing well  - monitor  7. Mixed Cataract OS - The symptoms of cataract, surgical options, and treatments and risks were discussed with patient. - discussed diagnosis and progression - under the expert management of Dr. Wynelle Link  8. Glaucoma suspect  - C/D 0.7 OU  - IOPs okay  - under the expert management of Dr. Wynelle Link   Ophthalmic Meds Ordered this visit:  Meds ordered this encounter  Medications   faricimab-svoa (VABYSMO) 6mg /0.71mL intravitreal injection     Return in about 5 weeks (around 01/17/2023) for f/u NPDR OU, DFE, OCT.  There are no Patient Instructions on file for this visit.  This document serves as a record of services personally performed by Karie Chimera, MD, PhD. It was created on their behalf by Gerilyn Nestle, COT an ophthalmic technician. The creation of this record is the provider's  dictation and/or activities during the visit.    Electronically signed by:  Charlette Caffey, COT  12/17/22 7:33 PM  This document serves as a record of services personally performed by Karie Chimera, MD, PhD. It was created on their behalf by Glee Arvin. Manson Passey, OA an ophthalmic technician. The creation of this record is the provider's dictation and/or activities during the visit.    Electronically signed by: Glee Arvin. Manson Passey, OA 12/17/22 7:33 PM  Karie Chimera, M.D., Ph.D. Diseases & Surgery of the Retina and Vitreous [facility]  I have reviewed the above documentation for accuracy and completeness, and I agree with the above. Karie Chimera, M.D., Ph.D. 12/17/22 7:36 PM   Abbreviations: M myopia (nearsighted); A astigmatism; H hyperopia (farsighted); P presbyopia; Mrx spectacle prescription;  CTL contact lenses; OD right eye; OS left eye; OU both eyes  XT exotropia; ET esotropia; PEK punctate epithelial keratitis; PEE punctate epithelial erosions; DES dry eye syndrome; MGD meibomian gland dysfunction; ATs artificial tears; PFAT's preservative free artificial tears; NSC nuclear sclerotic cataract; PSC posterior subcapsular cataract; ERM epi-retinal membrane; PVD posterior vitreous detachment; RD retinal detachment; DM diabetes mellitus; DR diabetic retinopathy; NPDR non-proliferative diabetic retinopathy; PDR proliferative diabetic retinopathy; CSME clinically significant macular edema; DME diabetic macular edema; dbh dot blot hemorrhages; CWS cotton wool spot; POAG primary open angle glaucoma; C/D cup-to-disc ratio; HVF humphrey visual  field; GVF goldmann visual field; OCT optical coherence tomography; IOP intraocular pressure; BRVO Branch retinal vein occlusion; CRVO central retinal vein occlusion; CRAO central retinal artery occlusion; BRAO branch retinal artery occlusion; RT retinal tear; SB scleral buckle; PPV pars plana vitrectomy; VH Vitreous hemorrhage; PRP  panretinal laser photocoagulation; IVK intravitreal kenalog; VMT vitreomacular traction; MH Macular hole;  NVD neovascularization of the disc; NVE neovascularization elsewhere; AREDS age related eye disease study; ARMD age related macular degeneration; POAG primary open angle glaucoma; EBMD epithelial/anterior basement membrane dystrophy; ACIOL anterior chamber intraocular lens; IOL intraocular lens; PCIOL posterior chamber intraocular lens; Phaco/IOL phacoemulsification with intraocular lens placement; PRK photorefractive keratectomy; LASIK laser assisted in situ keratomileusis; HTN hypertension; DM diabetes mellitus; COPD chronic obstructive pulmonary disease

## 2022-11-30 ENCOUNTER — Other Ambulatory Visit: Payer: Self-pay | Admitting: Family Medicine

## 2022-12-01 ENCOUNTER — Encounter: Payer: Self-pay | Admitting: Vascular Surgery

## 2022-12-01 ENCOUNTER — Ambulatory Visit (HOSPITAL_COMMUNITY)
Admission: RE | Admit: 2022-12-01 | Discharge: 2022-12-01 | Disposition: A | Discharge status: Home / Self Care | Payer: Medicare HMO | Source: Ambulatory Visit | Attending: Vascular Surgery | Admitting: Vascular Surgery

## 2022-12-01 ENCOUNTER — Ambulatory Visit: Payer: Medicare HMO | Admitting: Vascular Surgery

## 2022-12-01 ENCOUNTER — Ambulatory Visit (INDEPENDENT_AMBULATORY_CARE_PROVIDER_SITE_OTHER)
Admission: RE | Admit: 2022-12-01 | Discharge: 2022-12-01 | Disposition: A | Discharge status: Home / Self Care | Payer: Medicare HMO | Source: Ambulatory Visit | Attending: Vascular Surgery | Admitting: Vascular Surgery

## 2022-12-01 VITALS — BP 161/93 | HR 67 | Temp 98.1°F | Resp 16 | Ht 64.0 in | Wt 160.0 lb

## 2022-12-01 DIAGNOSIS — I739 Peripheral vascular disease, unspecified: Secondary | ICD-10-CM | POA: Diagnosis not present

## 2022-12-01 LAB — VAS US ABI WITH/WO TBI: Right ABI: 1.16

## 2022-12-01 NOTE — Progress Notes (Signed)
REASON FOR VISIT:   Follow-up of peripheral arterial disease.  MEDICAL ISSUES:   PERIPHERAL ARTERIAL DISEASE: This patient underwent angioplasty and stenting in 2021 in [place].  We have been following her on a yearly basis for her peripheral arterial disease.  It appears that she has a stent in the tibioperoneal trunk and this is patent without any evidence of stenosis.  She is on Plavix and is on a statin.  She has good Doppler signals in both feet.  She has calcified arteries of the ABIs are likely not reliable however she has good toe pressures.  I encouraged her to stay as active as possible.  She is not a smoker.  She will remain on her Plavix and her statin.  I explained that I will be retiring so she is agreeable to be seen on the PA schedule in 1 year.  Have ordered a duplex and follow-up ABIs at that time.  She knows to call sooner if she has problems.  HPI:   Erin Mitchell is a pleasant 77 y.o. female who I last saw on 11/11/2021 with peripheral arterial disease.  She had undergone angioplasty and stenting of the tibioperoneal trunk reportedly in [place] about 3 years ago.  The stent was patent at that time.  She comes in for a 1 year follow-up visit.  Since I saw her last, she denies any claudication, rest pain, or nonhealing ulcers.  She walks some to the mailbox every day.  She quit smoking in 1972.  She had the angioplasty and stenting in [place] in 2021.  I do not have those records but based on the duplex it looks like the stent was in the tibioperoneal trunk.  She tells me she had blockages at multiple levels however we did not see a stent in the SFA.  Past Medical History:  Diagnosis Date   Atrial fibrillation (HCC)    on Plavix   Cataract    Diabetes (HCC)    Diabetic retinopathy (HCC)    Exposure to Agent Orange    Hypertension    Hypertensive retinopathy    hypothyroidism    Hypothyroidism    Kidney disease     Family  History  Problem Relation Age of Onset   Hypertension Father    Diabetes Brother    Emphysema Mother     SOCIAL HISTORY: Social History   Tobacco Use   Smoking status: Former    Packs/day: 0.25    Years: 1.00    Additional pack years: 0.00    Total pack years: 0.25    Types: Cigarettes    Quit date: 06/06/1970    Years since quitting: 52.5   Smokeless tobacco: Never  Substance Use Topics   Alcohol use: Never    No Known Allergies  Current Outpatient Medications  Medication Sig Dispense Refill   Alcohol Swabs (DROPSAFE ALCOHOL PREP) 70 % PADS USE AS DIRECTED. 300 each 3   Blood Glucose Monitoring Suppl (TRUE METRIX METER) w/Device KIT Use as directed to test blood glucose daily 1 kit 0   Cholecalciferol (VITAMIN D-1000 MAX ST PO) Take 2,000 Units by mouth daily.     clopidogrel (PLAVIX) 75 MG tablet TAKE 1 TABLET EVERY DAY 90 tablet 3   glipiZIDE (GLUCOTROL) 5 MG tablet Take 0.5 tablets (2.5 mg total) by mouth 2 (two) times daily before a meal. 90 tablet 1   loperamide (IMODIUM A-D) 2 MG tablet Take 2-4 mg by mouth 4 (four)  times daily as needed for diarrhea or loose stools.     losartan (COZAAR) 100 MG tablet TAKE 1 TABLET EVERY DAY 90 tablet 3   Melatonin 10 MG CAPS Take 10 mg by mouth at bedtime.     metFORMIN (GLUCOPHAGE-XR) 500 MG 24 hr tablet TAKE 2 TABLETS (1,000 MG TOTAL) BY MOUTH DAILY WITH BREAKFAST. (Patient taking differently: Take 2,000 mg by mouth daily with breakfast. Patient taking 2 tabs in am and 2 tabs in pm) 180 tablet 3   metoprolol tartrate (LOPRESSOR) 25 MG tablet TAKE 1 TABLET TWICE DAILY WITH FOOD 180 tablet 3   NP THYROID 90 MG tablet Take 1 tablet (90 mg total) by mouth daily. 90 tablet 1   Probiotic Product (ALIGN) 4 MG CAPS Take 4 mg by mouth daily.     rosuvastatin (CRESTOR) 40 MG tablet TAKE 1 TABLET EVERY DAY 90 tablet 3   TRUE METRIX BLOOD GLUCOSE TEST test strip USE AS INSTRUCTED 300 strip 3   TRUEplus Lancets 33G MISC Use as directed daily.  100 each 3   Zinc Sulfate (ZINC 15 PO) Take by mouth.     No current facility-administered medications for this visit.    REVIEW OF SYSTEMS:  [X]  denotes positive finding, [ ]  denotes negative finding Cardiac  Comments:  Chest pain or chest pressure:    Shortness of breath upon exertion:    Short of breath when lying flat:    Irregular heart rhythm:        Vascular    Pain in calf, thigh, or hip brought on by ambulation:    Pain in feet at night that wakes you up from your sleep:     Blood clot in your veins:    Leg swelling:         Pulmonary    Oxygen at home:    Productive cough:     Wheezing:         Neurologic    Sudden weakness in arms or legs:     Sudden numbness in arms or legs:     Sudden onset of difficulty speaking or slurred speech:    Temporary loss of vision in one eye:     Problems with dizziness:         Gastrointestinal    Blood in stool:     Vomited blood:         Genitourinary    Burning when urinating:     Blood in urine:        Psychiatric    Major depression:         Hematologic    Bleeding problems:    Problems with blood clotting too easily:        Skin    Rashes or ulcers:        Constitutional    Fever or chills:     PHYSICAL EXAM:   Vitals:   12/01/22 1322  BP: (!) 161/93  Pulse: 67  Resp: 16  Temp: 98.1 F (36.7 C)  SpO2: 97%  Weight: 160 lb (72.6 kg)  Height: 5\' 4"  (1.626 m)    GENERAL: The patient is a well-nourished female, in no acute distress. The vital signs are documented above. CARDIAC: There is a regular rate and rhythm.  VASCULAR: I do not detect carotid bruits. She has palpable femoral pulses. I cannot palpate pedal pulses. PULMONARY: There is good air exchange bilaterally without wheezing or rales. ABDOMEN: Soft and non-tender with normal pitched bowel  sounds.  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 NEUROLOGIC: No focal weakness or paresthesias are detected. SKIN:  There are no ulcers or rashes noted. PSYCHIATRIC: The patient has a normal affect.  DATA:    ARTERIAL DOPPLER STUDY: I have independently interpreted her arterial Doppler study today.  On the right side there is a biphasic posterior tibial signal with a monophasic dorsalis pedis signal ABI is 100%.  Toe pressures 181 mmHg.  On the left side there is a triphasic posterior tibial signal with a biphasic dorsalis pedis signal.  The arteries are noncompressible and an ABI could not be obtained.  Toe pressures 129 mmHg.  ARTERIAL DUPLEX: I have independently interpreted her arterial duplex scan today.  The patient had a stent in the tibioperoneal trunk which is patent with biphasic flow throughout and no areas of stenosis noted.   Waverly Ferrari [facility] [phone]

## 2022-12-13 ENCOUNTER — Ambulatory Visit (INDEPENDENT_AMBULATORY_CARE_PROVIDER_SITE_OTHER): Payer: Medicare HMO | Admitting: Ophthalmology

## 2022-12-13 ENCOUNTER — Encounter (INDEPENDENT_AMBULATORY_CARE_PROVIDER_SITE_OTHER): Payer: Self-pay | Admitting: Ophthalmology

## 2022-12-13 DIAGNOSIS — H43823 Vitreomacular adhesion, bilateral: Secondary | ICD-10-CM | POA: Diagnosis not present

## 2022-12-13 DIAGNOSIS — Z7984 Long term (current) use of oral hypoglycemic drugs: Secondary | ICD-10-CM | POA: Diagnosis not present

## 2022-12-13 DIAGNOSIS — I10 Essential (primary) hypertension: Secondary | ICD-10-CM | POA: Diagnosis not present

## 2022-12-13 DIAGNOSIS — E113313 Type 2 diabetes mellitus with moderate nonproliferative diabetic retinopathy with macular edema, bilateral: Secondary | ICD-10-CM

## 2022-12-13 DIAGNOSIS — Z961 Presence of intraocular lens: Secondary | ICD-10-CM

## 2022-12-13 DIAGNOSIS — H35033 Hypertensive retinopathy, bilateral: Secondary | ICD-10-CM

## 2022-12-13 MED ORDER — FARICIMAB-SVOA 6 MG/0.05ML IZ SOLN
6.0000 mg | INTRAVITREAL | Status: AC | PRN
  Administered 2022-12-13: 6 mg via INTRAVITREAL

## 2022-12-17 ENCOUNTER — Other Ambulatory Visit: Payer: Self-pay

## 2022-12-17 DIAGNOSIS — I739 Peripheral vascular disease, unspecified: Secondary | ICD-10-CM

## 2023-01-06 ENCOUNTER — Telehealth: Payer: Self-pay | Admitting: Family Medicine

## 2023-01-06 NOTE — Telephone Encounter (Signed)
Patient called in to know how to take her Metformin, states that she has Metformin HCLER? and Metformin ER. Doesn't know which one to take or the amount to take. Has 5 bottles 40ct of Metformin ER.  Please advise.

## 2023-01-11 NOTE — Progress Notes (Signed)
[facility] - Clinic Note  01/18/2023     CHIEF COMPLAINT Patient presents for Retina Follow Up  HISTORY OF PRESENT ILLNESS: Erin Mitchell is a 77 y.o. female who presents to the clinic today for:   HPI     Retina Follow Up   Patient presents with  Diabetic Retinopathy.  In both eyes.  This started 5 weeks ago.  Duration of 5 weeks.  Since onset it is stable.  I, the attending physician,  performed the HPI with the patient and updated documentation appropriately.        Comments   5 week retina follow up NPDR and IVV OD pt is reporting no vision changes noticed she denies any flashes does have floaters last reading was 109 this morning       Last edited by Rennis Chris, MD on 01/18/2023 11:04 AM.     Pt states right eye vision is not any better, still has distortion  Referring physician: Everrett Coombe, DO [address]  [address] [place],  [place] [address]  HISTORICAL INFORMATION:   Selected notes from the MEDICAL RECORD NUMBER Referred by Dr. Everrett Coombe for DM exam LEE:  Ocular Hx- PMH- DM, HTN, afib, kidney disease, last a1c was 6.8 on 05.05.22    CURRENT MEDICATIONS: No current outpatient medications on file. (Ophthalmic Drugs)   No current facility-administered medications for this visit. (Ophthalmic Drugs)   Current Outpatient Medications (Other)  Medication Sig   Alcohol Swabs (DROPSAFE ALCOHOL PREP) 70 % PADS USE AS DIRECTED.   Blood Glucose Monitoring Suppl (TRUE METRIX METER) w/Device KIT Use as directed to test blood glucose daily   Cholecalciferol (VITAMIN D-1000 MAX ST PO) Take 2,000 Units by mouth daily.   clopidogrel (PLAVIX) 75 MG tablet TAKE 1 TABLET EVERY DAY   glipiZIDE (GLUCOTROL) 5 MG tablet Take 0.5 tablets (2.5 mg total) by mouth 2 (two) times daily before a meal.   loperamide (IMODIUM A-D) 2 MG tablet Take 2-4 mg by mouth 4 (four) times daily as needed for diarrhea or loose stools.   losartan  (COZAAR) 100 MG tablet TAKE 1 TABLET EVERY DAY   Melatonin 10 MG CAPS Take 10 mg by mouth at bedtime.   metFORMIN (GLUCOPHAGE-XR) 500 MG 24 hr tablet TAKE 4 TABLETS (2,000 MG TOTAL) BY MOUTH AS DIRECTED. 1000 MG IN THE MORNING AND 1000 MG AT NIGHT   metoprolol tartrate (LOPRESSOR) 25 MG tablet TAKE 1 TABLET TWICE DAILY WITH FOOD   NP THYROID 90 MG tablet Take 1 tablet (90 mg total) by mouth daily.   Probiotic Product (ALIGN) 4 MG CAPS Take 4 mg by mouth daily.   rosuvastatin (CRESTOR) 40 MG tablet TAKE 1 TABLET EVERY DAY   TRUE METRIX BLOOD GLUCOSE TEST test strip USE AS INSTRUCTED   TRUEplus Lancets 33G MISC Use as directed daily.   Zinc Sulfate (ZINC 15 PO) Take by mouth.   No current facility-administered medications for this visit. (Other)   REVIEW OF SYSTEMS: ROS   Positive for: Endocrine, Cardiovascular, Eyes Negative for: Constitutional, Gastrointestinal, Neurological, Skin, Genitourinary, Musculoskeletal, HENT, Respiratory, Psychiatric, Allergic/Imm, Heme/Lymph Last edited by Etheleen Mayhew, COT on 01/18/2023  8:48 AM.       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   VENTRAL HERNIA REPAIR N/A 12/28/2020   Procedure: LAPAROSCOPIC VENTRAL INCISIONAL HERNIA REPAIR WITH MESH;  Surgeon: Darnell Level, MD;  Location: [facility];  Service: General;  Laterality: N/A;   FAMILY HISTORY Family History  Problem Relation Age of Onset   Hypertension Father    Diabetes Brother    Emphysema Mother    SOCIAL HISTORY Social History    Tobacco Use   Smoking status: Former    Current packs/day: 0.00    Average packs/day: 0.3 packs/day for 1 year (0.3 ttl pk-yrs)    Types: Cigarettes    Start date: 06/06/1969    Quit date: 06/06/1970    Years since quitting: 52.6   Smokeless tobacco: Never  Vaping Use   Vaping status: Never Used  Substance Use Topics   Alcohol use: Never   Drug use: Never       OPHTHALMIC EXAM: Base Eye Exam     Visual Acuity (Snellen - Linear)       Right Left   Dist cc 20/40 -2 20/20   Dist ph cc NI          Tonometry (Tonopen, 8:52 AM)       Right Left   Pressure 11 12         Pupils       Pupils Dark Light Shape React APD   Right PERRL 2 1 Round Brisk None   Left PERRL 2 1 Round Brisk None         Visual Fields       Left Right    Full Full         Extraocular Movement       Right Left    Full, Ortho Full, Ortho         Neuro/Psych     Oriented x3: Yes   Mood/Affect: Normal         Dilation     Both eyes: 2.5% Phenylephrine @ 8:52 AM           Slit Lamp and Fundus Exam     Slit Lamp Exam       Right Left   Lids/Lashes Dermatochalasis - upper lid, Meibomian gland dysfunction Dermatochalasis - upper lid, Meibomian gland dysfunction   Conjunctiva/Sclera White and quiet White and quiet   Cornea arcus, trace tear film debris, well healed cataract wound arcus, trace PEE, mild tear film debris   Anterior Chamber Moderate depth, clear, very narrow temporal angles Moderate depth, clear, very narrow temporal angles   Iris Round and moderately dilated, mild anterior bowing Round and moderately dilated, mild anterior bowing   Lens PC IOL in good position 2-3+ Nuclear sclerosis with brunescence, 2-3+ Cortical cataract   Anterior Vitreous Vitreous syneresis, Posterior vitreous detachment, trace VH settled inferiorly -- resolved, +vitreous condensations Vitreous syneresis, Posterior vitreous detachment, vitreous condensations         Fundus Exam        Right Left   Disc Trace pallor, Sharp rim, +cupping Pink and Sharp, +cupping   C/D Ratio 0.7 0.7   Macula Blunted foveal  reflex, central cystic changes / edema -- slightly improved, RPE mottling, fine drusen, pigment clumping, cluster of MA temporal to fovea and temporal macula -- improved, mild ERM, punctate central exudation Flat, Blunted foveal reflex, RPE mottling, focal MA temp mac, no exudates, cystic changes nasal macula -- stably improved   Vessels attenuated, Tortuous attenuated, Tortuous   Periphery Attached, mild reticular degeneration, mild MA Attached, mild reticular degeneration, scattered MA, scattered DBH greatest posteriorly           Refraction     Wearing Rx       Sphere Cylinder Axis Add   Right -0.50 +0.75 166 +2.75   Left +1.75 Sphere  +2.75    Type: Bifocal           IMAGING AND PROCEDURES  Imaging and Procedures for 01/18/2023  OCT, Retina - OU - Both Eyes       Right Eye Quality was good. Central Foveal Thickness: 312. Progression has improved. Findings include no SRF, abnormal foveal contour, intraretinal hyper-reflective material, intraretinal fluid, vitreomacular adhesion (Mild interval improvement in central cyst and surrounding cystic changes).   Left Eye Quality was good. Central Foveal Thickness: 268. Progression has been stable. Findings include no SRF, abnormal foveal contour, intraretinal fluid, vitreous traction, vitreomacular adhesion (persistent central VMT).   Notes *Images captured and stored on drive  Diagnosis / Impression:  OD: +central DME, Mild interval improvement in central cyst and surrounding cystic changes OS: persistent central VMT  Clinical management:  See below  Abbreviations: NFP - Normal foveal profile. CME - cystoid macular edema. PED - pigment epithelial detachment. IRF - intraretinal fluid. SRF - subretinal fluid. EZ - ellipsoid zone. ERM - epiretinal membrane. ORA - outer retinal atrophy. ORT - outer  retinal tubulation. SRHM - subretinal hyper-reflective material. IRHM - intraretinal hyper-reflective material      Intravitreal Injection, Pharmacologic Agent - OD - Right Eye       Time Out 01/18/2023. 9:27 AM. Confirmed correct patient, procedure, site, and patient consented.   Anesthesia Topical anesthesia was used. Anesthetic medications included Lidocaine 2%, Proparacaine 0.5%.   Procedure Preparation included 5% betadine to ocular surface, eyelid speculum. A (32g) needle was used.   Injection: 6 mg faricimab-svoa 6 MG/0.05ML   Route: Intravitreal, Site: Right Eye   NDC: O8010301, Lot: [id number], Expiration date: 02/03/2025, Waste: 0 mL   Post-op Post injection exam found visual acuity of at least counting fingers. The patient tolerated the procedure well. There were no complications. The patient received written and verbal post procedure care education. Post injection medications were not given.            ASSESSMENT/PLAN:    ICD-10-CM   1. Moderate nonproliferative diabetic retinopathy of both eyes with macular edema associated with type 2 diabetes mellitus (HCC)  E11.3313 OCT, Retina - OU - Both Eyes    Intravitreal Injection, Pharmacologic Agent - OD - Right Eye    faricimab-svoa (VABYSMO) 6mg /0.77mL intravitreal injection    2. Long term (current) use of oral hypoglycemic drugs  Z79.84     3. 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 Essential hypertension  I10     5. Hypertensive retinopathy of both eyes  H35.033     6. Pseudophakia  Z96.1     7. Combined forms of age-related cataract of left eye  H25.812     8. Glaucoma suspect of both eyes  H40.003      1,2. Moderate non-proliferative diabetic  retinopathy, both eyes  - s/p IVE OD #1 (06.22.22)--sample, #2 (09.07.22), #3 (11.14.22), #4 (12.28.22), #5 (02.08.23), IVE #6 (03.15.23), #7 (04.19.23), #8 (05.24.23), #9 (06.28.23), #10 (07.27.23), #11 (08.24.23) -- IVE resistance  - s/p IVV OD #1  (09.21.23), #2 (10.19.23), #3 (11.16.23), #4 (12.14.23), #5 (01.18.24), #6 (02.21.24), #7 (03.27.24), #8 (05.01.24), #9 (06.05.24), #10 (07.09.24) - previously seen at [facility] by Dr. Trudie Buckler (Fax [phone]) - history of anti-VEGF injections since 2019 -- last injection IVE OD Feb/Mar 2022 -- q72mo interval - FA (11.14.22) shows OD: Focal hyperflourescence IT to fovea; no NV; OS: No NV - exam w/ scattered MA - OCT shows OD: +central DME, Mild interval improvement in central cyst and surrounding cystic changes; OS: tr persistent cystic changes nasal macula, persistent central VMT at 5 wks - BCVA OD 20/30 -- stable improvement s/p CEIOL; OS 20/20 -- stable. - recommend IVV OD #11 today, 08.14.24 -- f/u in 5 wks again - pt wishes to proceed - RBA of procedure discussed, questions answered - IVV informed consent obtained and signed, 09.21.23 - see procedure note - f/u in 5 wks  -- DFE/OCT, possible injection  3. VMA/VMT OU  - central VMA/VMT OD likely contributing to central cyst  - OS stable  - monitor  4,5. Hypertensive retinopathy OU - discussed importance of tight BP control - monitor  6. Pseudophakia OD  - s/p CE/IOL OD (02.29.24, Dr. Wynelle Link)  - IOL in good position, doing well  - monitor  7. Mixed Cataract OS - The symptoms of cataract, surgical options, and treatments and risks were discussed with patient. - discussed diagnosis and progression - under the expert management of Dr. Wynelle Link  8. Glaucoma suspect  - C/D 0.7 OU  - IOPs okay  - under the expert management of Dr. Wynelle Link   Ophthalmic Meds Ordered this visit:  Meds ordered this encounter  Medications   faricimab-svoa (VABYSMO) 6mg /0.14mL intravitreal injection     Return in about 5 weeks (around 02/22/2023) for f/u NPDR OU, DFE, OCT.  There are no Patient Instructions on file for this visit.  This document serves as a record of services personally performed by Karie Chimera, MD, PhD. It was created  on their behalf by De Blanch, an ophthalmic technician. The creation of this record is the provider's dictation and/or activities during the visit.    Electronically signed by: De Blanch, OA, 01/18/23  11:06 AM  This document serves as a record of services personally performed by Karie Chimera, MD, PhD. It was created on their behalf by Glee Arvin. Manson Passey, OA an ophthalmic technician. The creation of this record is the provider's dictation and/or activities during the visit.    Electronically signed by: Glee Arvin. Manson Passey, OA 01/18/23 11:06 AM  Karie Chimera, M.D., Ph.D. Diseases & Surgery of the Retina and Vitreous [facility]  I have reviewed the above documentation for accuracy and completeness, and I agree with the above. Karie Chimera, M.D., Ph.D. 01/18/23 11:07 AM   Abbreviations: M myopia (nearsighted); A astigmatism; H hyperopia (farsighted); P presbyopia; Mrx spectacle prescription;  CTL contact lenses; OD right eye; OS left eye; OU both eyes  XT exotropia; ET esotropia; PEK punctate epithelial keratitis; PEE punctate epithelial erosions; DES dry eye syndrome; MGD meibomian gland dysfunction; ATs artificial tears; PFAT's preservative free artificial tears; NSC nuclear sclerotic cataract; PSC posterior subcapsular cataract; ERM epi-retinal membrane; PVD posterior vitreous detachment; RD retinal detachment; DM diabetes mellitus;  DR diabetic retinopathy; NPDR non-proliferative diabetic retinopathy; PDR proliferative diabetic retinopathy; CSME clinically significant macular edema; DME diabetic macular edema; dbh dot blot hemorrhages; CWS cotton wool spot; POAG primary open angle glaucoma; C/D cup-to-disc ratio; HVF humphrey visual field; GVF goldmann visual field; OCT optical coherence tomography; IOP intraocular pressure; BRVO Branch retinal vein occlusion; CRVO central retinal vein occlusion; CRAO central retinal artery occlusion; BRAO branch retinal artery  occlusion; RT retinal tear; SB scleral buckle; PPV pars plana vitrectomy; VH Vitreous hemorrhage; PRP panretinal laser photocoagulation; IVK intravitreal kenalog; VMT vitreomacular traction; MH Macular hole;  NVD neovascularization of the disc; NVE neovascularization elsewhere; AREDS age related eye disease study; ARMD age related macular degeneration; POAG primary open angle glaucoma; EBMD epithelial/anterior basement membrane dystrophy; ACIOL anterior chamber intraocular lens; IOL intraocular lens; PCIOL posterior chamber intraocular lens; Phaco/IOL phacoemulsification with intraocular lens placement; PRK photorefractive keratectomy; LASIK laser assisted in situ keratomileusis; HTN hypertension; DM diabetes mellitus; COPD chronic obstructive pulmonary disease

## 2023-01-13 ENCOUNTER — Other Ambulatory Visit: Payer: Self-pay | Admitting: Family Medicine

## 2023-01-13 DIAGNOSIS — E1152 Type 2 diabetes mellitus with diabetic peripheral angiopathy with gangrene: Secondary | ICD-10-CM

## 2023-01-13 NOTE — Telephone Encounter (Signed)
LVM for patient callback. Pt recently received Metformin refill via [facility]. This rx is from [facility]. Requested callback to verify dosage, sig, and refills pharmacy.

## 2023-01-18 ENCOUNTER — Ambulatory Visit (INDEPENDENT_AMBULATORY_CARE_PROVIDER_SITE_OTHER): Payer: Medicare HMO | Admitting: Ophthalmology

## 2023-01-18 ENCOUNTER — Encounter (INDEPENDENT_AMBULATORY_CARE_PROVIDER_SITE_OTHER): Payer: Self-pay | Admitting: Ophthalmology

## 2023-01-18 DIAGNOSIS — H25812 Combined forms of age-related cataract, left eye: Secondary | ICD-10-CM | POA: Diagnosis not present

## 2023-01-18 DIAGNOSIS — Z7984 Long term (current) use of oral hypoglycemic drugs: Secondary | ICD-10-CM

## 2023-01-18 DIAGNOSIS — H40003 Preglaucoma, unspecified, bilateral: Secondary | ICD-10-CM

## 2023-01-18 DIAGNOSIS — E113313 Type 2 diabetes mellitus with moderate nonproliferative diabetic retinopathy with macular edema, bilateral: Secondary | ICD-10-CM

## 2023-01-18 DIAGNOSIS — H43823 Vitreomacular adhesion, bilateral: Secondary | ICD-10-CM | POA: Diagnosis not present

## 2023-01-18 DIAGNOSIS — H35033 Hypertensive retinopathy, bilateral: Secondary | ICD-10-CM | POA: Diagnosis not present

## 2023-01-18 DIAGNOSIS — I10 Essential (primary) hypertension: Secondary | ICD-10-CM | POA: Diagnosis not present

## 2023-01-18 DIAGNOSIS — Z961 Presence of intraocular lens: Secondary | ICD-10-CM

## 2023-01-18 MED ORDER — FARICIMAB-SVOA 6 MG/0.05ML IZ SOLN
6.0000 mg | INTRAVITREAL | Status: AC | PRN
  Administered 2023-01-18: 6 mg via INTRAVITREAL

## 2023-01-20 ENCOUNTER — Ambulatory Visit: Payer: Medicare HMO | Admitting: Family Medicine

## 2023-01-24 ENCOUNTER — Ambulatory Visit: Payer: Medicare HMO | Admitting: Family Medicine

## 2023-01-30 ENCOUNTER — Encounter: Payer: Self-pay | Admitting: Family Medicine

## 2023-01-30 ENCOUNTER — Ambulatory Visit: Payer: Medicare HMO | Admitting: Family Medicine

## 2023-01-30 VITALS — BP 143/76 | HR 65 | Ht 64.0 in | Wt 163.0 lb

## 2023-01-30 DIAGNOSIS — E1169 Type 2 diabetes mellitus with other specified complication: Secondary | ICD-10-CM

## 2023-01-30 DIAGNOSIS — E1152 Type 2 diabetes mellitus with diabetic peripheral angiopathy with gangrene: Secondary | ICD-10-CM

## 2023-01-30 DIAGNOSIS — E785 Hyperlipidemia, unspecified: Secondary | ICD-10-CM | POA: Diagnosis not present

## 2023-01-30 DIAGNOSIS — I10 Essential (primary) hypertension: Secondary | ICD-10-CM | POA: Diagnosis not present

## 2023-01-30 DIAGNOSIS — Z23 Encounter for immunization: Secondary | ICD-10-CM | POA: Diagnosis not present

## 2023-01-30 DIAGNOSIS — E039 Hypothyroidism, unspecified: Secondary | ICD-10-CM | POA: Diagnosis not present

## 2023-01-30 DIAGNOSIS — I480 Paroxysmal atrial fibrillation: Secondary | ICD-10-CM

## 2023-01-30 DIAGNOSIS — Z7984 Long term (current) use of oral hypoglycemic drugs: Secondary | ICD-10-CM | POA: Diagnosis not present

## 2023-01-30 LAB — POCT GLYCOSYLATED HEMOGLOBIN (HGB A1C): HbA1c, POC (controlled diabetic range): 6.1 % (ref 0.0–7.0)

## 2023-01-30 MED ORDER — METFORMIN HCL ER 500 MG PO TB24: 1000.0000 mg | ORAL_TABLET | Freq: Two times a day (BID) | ORAL | 1 refills | Status: DC

## 2023-01-30 NOTE — Assessment & Plan Note (Signed)
Blood pressure is elevated today.  Her home readings remain well-controlled at this time. Recommend continuation of current medications.

## 2023-01-30 NOTE — Progress Notes (Signed)
Erin Mitchell [facility] - 77 y.o. female MRN [id number]  Date of birth: 1945-12-25  Subjective Chief Complaint  Patient presents with   Diabetes    HPI Erin Mitchell is a 77 y.o. female here today for follow up visit.   She reports that she is doing pretty well.   She remains on metformin for management of diabetes.  Stopped glipizide due to hypoglycemia. She does have history of toe amputation.  Blood sugars have been pretty well controlled.  A1c today is 6.1%.  Tolerating crestor well for associated HLD. Due for updated labs.   BP has been pretty well controlled with losartan.  No sise effects at current strength.  She is also on metoprolol for rate control related to PAF.  She is on Plavix rather than DOAC due to associated PAD.   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 & 3rd   VENTRAL HERNIA REPAIR N/A 12/28/2020   Procedure: LAPAROSCOPIC VENTRAL INCISIONAL HERNIA REPAIR WITH MESH;  Surgeon: Darnell Level, MD;  Location: [facility];  Service: General;  Laterality: N/A;    Social History   Socioeconomic History   Marital status: Married    Spouse name: Not on file   Number of children: 7   Years of education: Not on file   Highest education level: Not on file  Occupational History   Occupation: Retired  Tobacco Use   Smoking status: Former    Current packs/day: 0.00    Average packs/day: 0.3 packs/day for 1 year (0.3 ttl pk-yrs)    Types: Cigarettes    Start date:  06/06/1969    Quit date: 06/06/1970    Years since quitting: 52.6   Smokeless tobacco: Never  Vaping Use   Vaping status: Never Used  Substance and Sexual Activity   Alcohol use: Never   Drug use: Never   Sexual activity: Not Currently    Partners: Male  Other Topics Concern   Not on file  Social History Narrative   Not on file   Social Determinants of Health   Financial Resource Strain: Not on file  Food Insecurity: Not on file  Transportation Needs: Not on file  Physical Activity: Not on file  Stress: Not on file  Social Connections: Not on file    Family History  Problem Relation Age of Onset   Hypertension Father    Diabetes Brother    Emphysema Mother     Health Maintenance  Topic Date Due   OPHTHALMOLOGY EXAM  09/23/2022   INFLUENZA VACCINE  01/05/2023   Medicare Annual Wellness (AWV)  02/19/2023 (Originally Sep 10, 1945)   Zoster Vaccines- Shingrix (1 of 2) 02/19/2023 (Originally 04/12/1998)   COVID-19 Vaccine (3 - 2023-24 season) 03/05/2023 (Originally 02/04/2022)   Diabetic kidney evaluation - eGFR measurement  04/21/2023   Diabetic kidney evaluation - Urine  ACR  04/21/2023   HEMOGLOBIN A1C  04/21/2023   FOOT EXAM  10/19/2023   DTaP/Tdap/Td (2 - Td or Tdap) 08/15/2029   Pneumonia Vaccine 61+ Years old  Completed   HPV VACCINES  Aged Out   MAMMOGRAM  Discontinued   DEXA SCAN  Discontinued   Colonoscopy  Discontinued   Hepatitis C Screening  Discontinued     ----------------------------------------------------------------------------------------------------------------------------------------------------------------------------------------------------------------- Physical Exam BP (!) 143/76 (BP Location: Left Arm, Patient Position: Sitting, Cuff Size: Normal)   Pulse 65   Ht 5\' 4"  (1.626 m)   Wt 163 lb (73.9 kg)   SpO2 99%   BMI 27.98 kg/m   Physical Exam Constitutional:      Appearance: Normal appearance.  Eyes:     General: No scleral  icterus. Cardiovascular:     Rate and Rhythm: Normal rate and regular rhythm.  Pulmonary:     Effort: Pulmonary effort is normal.     Breath sounds: Normal breath sounds.  Neurological:     Mental Status: She is alert.  Psychiatric:        Mood and Affect: Mood normal.        Behavior: Behavior normal.     ------------------------------------------------------------------------------------------------------------------------------------------------------------------------------------------------------------------- Assessment and Plan  Hypertension Blood pressure is elevated today.  Her home readings remain well-controlled at this time. Recommend continuation of current medications.   PAF (paroxysmal atrial fibrillation) (HCC) Rate controlled at this time.  Denies new or worsening symptoms.  She will continue to see cardiology for management.  Acquired hypothyroidism She remains on NP thyroid.  Feels well with this.  Updating TSH.  Hyperlipidemia associated with type 2 diabetes mellitus (HCC) Doing well with rosuvastatin.  Continue current strength.  Updated lipid panel ordered.   Type 2 diabetes mellitus with diabetic peripheral angiopathy and gangrene, without long-term current use of insulin (HCC) Diabetes is much better controlled. She stopped glipizide due to hypoglycemia.  Continue metformin and dietary changes.    No orders of the defined types were placed in this encounter.   No follow-ups on file.    This visit occurred during the SARS-CoV-2 public health emergency.  Safety protocols were in place, including screening questions prior to the visit, additional usage of staff PPE, and extensive cleaning of exam room while observing appropriate contact time as indicated for disinfecting solutions.

## 2023-01-30 NOTE — Assessment & Plan Note (Signed)
She remains on NP thyroid.  Feels well with this.  Updating TSH.

## 2023-01-30 NOTE — Assessment & Plan Note (Signed)
Diabetes is much better controlled. She stopped glipizide due to hypoglycemia.  Continue metformin and dietary changes.

## 2023-01-30 NOTE — Progress Notes (Signed)
Faxed COC to [facility] @ [phone] for diabetic eye exam results.

## 2023-01-30 NOTE — Assessment & Plan Note (Signed)
Doing well with rosuvastatin.  Continue current strength.  Updated lipid panel ordered.

## 2023-01-30 NOTE — Assessment & Plan Note (Signed)
Rate controlled at this time.  Denies new or worsening symptoms.  She will continue to see cardiology for management.

## 2023-01-31 LAB — CMP14+EGFR
ALT: 20 IU/L (ref 0–32)
AST: 18 IU/L (ref 0–40)
Albumin: 4.2 g/dL (ref 3.8–4.8)
Alkaline Phosphatase: 73 IU/L (ref 44–121)
BUN/Creatinine Ratio: 24 (ref 12–28)
BUN: 17 mg/dL (ref 8–27)
Bilirubin Total: 0.4 mg/dL (ref 0.0–1.2)
CO2: 19 mmol/L — ABNORMAL LOW (ref 20–29)
Calcium: 9.3 mg/dL (ref 8.7–10.3)
Chloride: 105 mmol/L (ref 96–106)
Creatinine, Ser: 0.7 mg/dL (ref 0.57–1.00)
Globulin, Total: 2.3 g/dL (ref 1.5–4.5)
Glucose: 160 mg/dL — ABNORMAL HIGH (ref 70–99)
Potassium: 4.2 mmol/L (ref 3.5–5.2)
Sodium: 140 mmol/L (ref 134–144)
Total Protein: 6.5 g/dL (ref 6.0–8.5)
eGFR: 91 mL/min/{1.73_m2} (ref >59)

## 2023-01-31 LAB — CBC WITH DIFFERENTIAL/PLATELET
Basophils Absolute: 0 10*3/uL (ref 0.0–0.2)
Basos: 1 %
EOS (ABSOLUTE): 0.3 10*3/uL (ref 0.0–0.4)
Eos: 5 %
Hematocrit: 36.5 % (ref 34.0–46.6)
Hemoglobin: 12.2 g/dL (ref 11.1–15.9)
Immature Grans (Abs): 0 10*3/uL (ref 0.0–0.1)
Immature Granulocytes: 0 %
Lymphocytes Absolute: 1.9 10*3/uL (ref 0.7–3.1)
Lymphs: 29 %
MCH: 29.4 pg (ref 26.6–33.0)
MCHC: 33.4 g/dL (ref 31.5–35.7)
MCV: 88 fL (ref 79–97)
Monocytes Absolute: 0.6 10*3/uL (ref 0.1–0.9)
Monocytes: 8 %
Neutrophils Absolute: 3.7 10*3/uL (ref 1.4–7.0)
Neutrophils: 57 %
Platelets: 217 10*3/uL (ref 150–450)
RBC: 4.15 x10E6/uL (ref 3.77–5.28)
RDW: 13.7 % (ref 11.7–15.4)
WBC: 6.6 10*3/uL (ref 3.4–10.8)

## 2023-01-31 LAB — MICROALBUMIN / CREATININE URINE RATIO
Creatinine, Urine: 63.3 mg/dL
Microalb/Creat Ratio: 483 mg/g{creat} — ABNORMAL HIGH (ref 0–29)
Microalbumin, Urine: 306 ug/mL

## 2023-01-31 LAB — LIPID PANEL WITH LDL/HDL RATIO
Cholesterol, Total: 113 mg/dL (ref 100–199)
HDL: 59 mg/dL (ref >39)
LDL Chol Calc (NIH): 36 mg/dL (ref 0–99)
LDL/HDL Ratio: 0.6 ratio (ref 0.0–3.2)
Triglycerides: 98 mg/dL (ref 0–149)
VLDL Cholesterol Cal: 18 mg/dL (ref 5–40)

## 2023-01-31 LAB — TSH: TSH: 0.718 u[IU]/mL (ref 0.450–4.500)

## 2023-02-08 ENCOUNTER — Other Ambulatory Visit: Payer: Self-pay | Admitting: Family Medicine

## 2023-02-08 MED ORDER — DAPAGLIFLOZIN PROPANEDIOL 10 MG PO TABS: 10.0000 mg | ORAL_TABLET | Freq: Every day | ORAL | 3 refills | Status: DC

## 2023-02-10 ENCOUNTER — Telehealth: Payer: Self-pay | Admitting: Family Medicine

## 2023-02-10 ENCOUNTER — Telehealth: Payer: Self-pay

## 2023-02-10 ENCOUNTER — Ambulatory Visit (INDEPENDENT_AMBULATORY_CARE_PROVIDER_SITE_OTHER): Payer: Medicare HMO | Admitting: Podiatry

## 2023-02-10 ENCOUNTER — Encounter: Payer: Self-pay | Admitting: Podiatry

## 2023-02-10 DIAGNOSIS — E1152 Type 2 diabetes mellitus with diabetic peripheral angiopathy with gangrene: Secondary | ICD-10-CM

## 2023-02-10 DIAGNOSIS — B351 Tinea unguium: Secondary | ICD-10-CM

## 2023-02-10 DIAGNOSIS — M79675 Pain in left toe(s): Secondary | ICD-10-CM

## 2023-02-10 DIAGNOSIS — M79674 Pain in right toe(s): Secondary | ICD-10-CM | POA: Diagnosis not present

## 2023-02-10 DIAGNOSIS — I739 Peripheral vascular disease, unspecified: Secondary | ICD-10-CM

## 2023-02-10 MED ORDER — EMPAGLIFLOZIN 25 MG PO TABS: 25.0000 mg | ORAL_TABLET | Freq: Every day | ORAL | 1 refills | Status: DC

## 2023-02-10 NOTE — Telephone Encounter (Signed)
 Attempted call to patient. Left a voice mail message  to return our call.

## 2023-02-10 NOTE — Telephone Encounter (Signed)
Patient called office concerning her medication  dapagliflozin propanediol (FARXIGA) 10 MG TABS tablet, patient states that she'd like to see if there is an alternative for her to get being that her insurance doesn't cover this med, please advise, thanks.

## 2023-02-10 NOTE — Telephone Encounter (Signed)
Patient called in stating that she picked up [place] from Pharmacy. The other rx was too expensive

## 2023-02-10 NOTE — Progress Notes (Signed)
  Subjective:  Patient ID: Erin Mitchell, female    DOB: 01/27/1946,   MRN: [id number]  No chief complaint on file.   77 y.o. female presents for concern of thickened elongated and painful nails that are difficult to trim. Requesting to have them trimmed today. Relates burning and tingling in their feet. Patient is diabetic and last A1c was  Lab Results  Component Value Date   HGBA1C 6.1 01/30/2023   .   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 Also has a history of PAD . Denies any other pedal complaints. Denies n/v/f/c.   PCP: Everrett Coombe MD   Past Medical History:  Diagnosis Date   Atrial fibrillation (HCC)    on Plavix   Cataract    Diabetes (HCC)    Diabetic retinopathy (HCC)    Exposure to Agent Orange    Hypertension    Hypertensive retinopathy    hypothyroidism    Hypothyroidism    Kidney disease     Objective:  Physical Exam: Vascular: DP/PT pulses 2/4 bilateral. CFT <3 seconds. Absent hair growth on digits. No edema.  Skin. No lacerations or abrasions bilateral feet. Nails 1-5 are thickened discolored and elongated with subungual debris.  Right great toe hhealed well no open areas.  Musculoskeletal: MMT 5/5 bilateral lower extremities in DF, PF, Inversion and Eversion. Deceased ROM in DF of ankle joint. Right second and third digits amputated. Severe bunion of right foot.  Neurological: Sensation intact to light touch.   Assessment:   1. Dermatophytosis of nail   2. Pain in toes of both feet   3. PAD (peripheral artery disease) (HCC)   4. Type 2 diabetes mellitus with diabetic peripheral angiopathy and gangrene, without long-term current use of insulin (HCC)           Plan:  Patient was evaluated and treated and all questions answered. -Discussed and educated patient on diabetic foot care, especially with  regards to the vascular, neurological and musculoskeletal systems.   -Stressed the importance of good glycemic control and the detriment of not  controlling glucose levels in relation to the foot. -Discussed supportive shoes at all times and checking feet regularly.  -Mechanically debrided all nails 1-5 bilateral using sterile nail nipper and filed with dremel without incident  -Answered all patient questions -Patient to return  in 3 months for at risk foot care -Patient advised to call the office if any problems or questions arise in the meantime.    Louann Sjogren, DPM

## 2023-02-13 NOTE — Telephone Encounter (Signed)
Spoke with patient.  When she went to pick up the Jardiance it was $135 and [name] was only $95 so she went with the [place] instead of the [place]. She apologizes for the confusion.

## 2023-02-21 NOTE — Progress Notes (Signed)
[facility] - Clinic Note  02/22/2023     CHIEF COMPLAINT Patient presents for Retina Follow Up  HISTORY OF PRESENT ILLNESS: Erin Mitchell is a 77 y.o. female who presents to the clinic today for:   HPI     Retina Follow Up   Patient presents with  Diabetic Retinopathy.  In both eyes.  This started 5 weeks ago.  I, the attending physician,  performed the HPI with the patient and updated documentation appropriately.        Comments   Patient here for 5 weeks retina follow up for NPDR OU. Patient states vision doing ok. No eye pain. Erin Mitchell was added to medicines.       Last edited by Rennis Chris, MD on 02/22/2023 12:25 PM.    Pt states she still has distortion in her vision, but feels like it has improved   Referring physician: Everrett Coombe, DO [address] [facility] [address]  [address] [place],  [place] [address]  HISTORICAL INFORMATION:   Selected notes from the MEDICAL RECORD NUMBER Referred by Dr. Everrett Coombe for DM exam LEE:  Ocular Hx- PMH- DM, HTN, afib, kidney disease, last a1c was 6.8 on 05.05.22    CURRENT MEDICATIONS: No current outpatient medications on file. (Ophthalmic Drugs)   No current facility-administered medications for this visit. (Ophthalmic Drugs)   Current Outpatient Medications (Other)  Medication Sig   Alcohol Swabs (DROPSAFE ALCOHOL PREP) 70 % PADS USE AS DIRECTED.   Blood Glucose Monitoring Suppl (TRUE METRIX METER) w/Device KIT Use as directed to test blood glucose daily   Cholecalciferol (VITAMIN D-1000 MAX ST PO) Take 2,000 Units by mouth daily.   clopidogrel (PLAVIX) 75 MG tablet TAKE 1 TABLET EVERY DAY   dapagliflozin propanediol (FARXIGA) 5 MG TABS tablet Take 10 mg by mouth daily.   empagliflozin (JARDIANCE) 25 MG TABS tablet Take 1 tablet (25 mg total) by mouth daily before breakfast.   loperamide (IMODIUM A-D) 2 MG tablet Take 2-4 mg by mouth 4 (four) times daily as needed for diarrhea or loose  stools.   losartan (COZAAR) 100 MG tablet TAKE 1 TABLET EVERY DAY   Melatonin 10 MG CAPS Take 10 mg by mouth at bedtime.   metFORMIN (GLUCOPHAGE-XR) 500 MG 24 hr tablet Take 2 tablets (1,000 mg total) by mouth 2 (two) times daily with a meal.   metoprolol tartrate (LOPRESSOR) 25 MG tablet TAKE 1 TABLET TWICE DAILY WITH FOOD   NP THYROID 90 MG tablet Take 1 tablet (90 mg total) by mouth daily.   Probiotic Product (ALIGN) 4 MG CAPS Take 4 mg by mouth daily.   rosuvastatin (CRESTOR) 40 MG tablet TAKE 1 TABLET EVERY DAY   TRUE METRIX BLOOD GLUCOSE TEST test strip USE AS INSTRUCTED   TRUEplus Lancets 33G MISC Use as directed daily.   Zinc Sulfate (ZINC 15 PO) Take by mouth.   No current facility-administered medications for this visit. (Other)   REVIEW OF SYSTEMS: ROS   Positive for: Endocrine, Cardiovascular, Eyes Negative for: Constitutional, Gastrointestinal, Neurological, Skin, Genitourinary, Musculoskeletal, HENT, Respiratory, Psychiatric, Allergic/Imm, Heme/Lymph Last edited by Laddie Aquas, COA on 02/22/2023  8:14 AM.     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   VENTRAL HERNIA REPAIR N/A 12/28/2020   Procedure: LAPAROSCOPIC VENTRAL INCISIONAL HERNIA REPAIR WITH MESH;  Surgeon: Darnell Level, MD;  Location: [facility];  Service: General;  Laterality: N/A;   FAMILY HISTORY Family History  Problem Relation Age of Onset   Hypertension Father    Diabetes Brother    Emphysema Mother    SOCIAL HISTORY Social History   Tobacco Use    Smoking status: Former    Current packs/day: 0.00    Average packs/day: 0.3 packs/day for 1 year (0.3 ttl pk-yrs)    Types: Cigarettes    Start date: 06/06/1969    Quit date: 06/06/1970    Years since quitting: 52.7   Smokeless tobacco: Never  Vaping Use   Vaping status: Never Used  Substance Use Topics   Alcohol use: Never   Drug use: Never       OPHTHALMIC EXAM: Base Eye Exam     Visual Acuity (Snellen - Linear)       Right Left   Dist cc 20/30 -1 20/20    Correction: Glasses         Tonometry (Tonopen, 8:12 AM)       Right Left   Pressure 06 11         Pupils       Dark Light Shape React APD   Right 2 1 Round Brisk None   Left 2 1 Round Brisk None         Visual Fields (Counting fingers)       Left Right    Full Full         Extraocular Movement       Right Left    Full, Ortho Full, Ortho         Neuro/Psych     Oriented x3: Yes   Mood/Affect: Normal         Dilation     Both eyes: 1.0% Mydriacyl, 2.5% Phenylephrine @ 8:12 AM           Slit Lamp and Fundus Exam     Slit Lamp Exam       Right Left   Lids/Lashes Dermatochalasis - upper lid, Meibomian gland dysfunction Dermatochalasis - upper lid, Meibomian gland dysfunction   Conjunctiva/Sclera White and quiet White and quiet   Cornea arcus, trace tear film debris, well healed cataract wound arcus, trace PEE, mild tear film debris   Anterior Chamber Moderate depth, clear, very narrow temporal angles Moderate depth, clear, very narrow temporal angles   Iris Round and moderately dilated, mild anterior bowing Round and moderately dilated, mild anterior bowing   Lens PC IOL in good position 2-3+ Nuclear sclerosis with brunescence, 2-3+ Cortical cataract   Anterior Vitreous Vitreous syneresis, Posterior vitreous detachment, +vitreous condensations Vitreous syneresis, Posterior vitreous detachment, vitreous condensations         Fundus Exam       Right Left   Disc Trace  pallor, Sharp rim, +cupping Pink and Sharp, +cupping   C/D Ratio 0.7 0.7   Macula Blunted foveal reflex, central cystic changes / edema -- slightly improved, RPE mottling, fine drusen, pigment clumping, cluster of  MA temporal to fovea and temporal macula -- improved, mild ERM, punctate central exudation Flat, Blunted foveal reflex, RPE mottling, focal MA temp mac, no exudates, cystic changes nasal macula -- stably improved   Vessels attenuated, Tortuous attenuated, Tortuous   Periphery Attached, mild reticular degeneration, rare MA Attached, mild reticular degeneration, rare MA           Refraction     Wearing Rx       Sphere Cylinder Axis Add   Right -0.50 +0.75 166 +2.75   Left +1.75 Sphere  +2.75    Type: Bifocal           IMAGING AND PROCEDURES  Imaging and Procedures for 02/22/2023  OCT, Retina - OU - Both Eyes       Right Eye Quality was good. Central Foveal Thickness: 307. Progression has been stable. Findings include no SRF, abnormal foveal contour, intraretinal hyper-reflective material, intraretinal fluid, vitreomacular adhesion (Persistent central cyst and surrounding cystic changes -- slightly improved).   Left Eye Quality was good. Central Foveal Thickness: 266. Progression has been stable. Findings include no SRF, abnormal foveal contour, intraretinal fluid, vitreous traction, vitreomacular adhesion (persistent central VMT).   Notes *Images captured and stored on drive  Diagnosis / Impression:  OD: +central DME, Persistent central cyst and surrounding cystic changes -- slightly improved OS: persistent central VMT  Clinical management:  See below  Abbreviations: NFP - Normal foveal profile. CME - cystoid macular edema. PED - pigment epithelial detachment. IRF - intraretinal fluid. SRF - subretinal fluid. EZ - ellipsoid zone. ERM - epiretinal membrane. ORA - outer retinal atrophy. ORT - outer retinal tubulation. SRHM - subretinal hyper-reflective material.  IRHM - intraretinal hyper-reflective material      Intravitreal Injection, Pharmacologic Agent - OD - Right Eye       Time Out 02/22/2023. 9:16 AM. Confirmed correct patient, procedure, site, and patient consented.   Anesthesia Topical anesthesia was used. Anesthetic medications included Lidocaine 2%, Proparacaine 0.5%.   Procedure Preparation included 5% betadine to ocular surface, eyelid speculum. A (32g) needle was used.   Injection: 6 mg faricimab-svoa 6 MG/0.05ML   Route: Intravitreal, Site: Right Eye   NDC: [phone], Lot: [id number], Expiration date: 03/05/2024, Waste: 0 mL   Post-op Post injection exam found visual acuity of at least counting fingers. The patient tolerated the procedure well. There were no complications. The patient received written and verbal post procedure care education. Post injection medications were not given.   Notes *Prefilled medication administered* NDC #78295-621-30           ASSESSMENT/PLAN:    ICD-10-CM   1. Moderate nonproliferative diabetic retinopathy of both eyes with macular edema associated with type 2 diabetes mellitus (HCC)  E11.3313 OCT, Retina - OU - Both Eyes    Intravitreal Injection, Pharmacologic Agent - OD - Right Eye    faricimab-svoa (VABYSMO) 6mg /0.39mL intravitreal injection    2. Long term (current) use of oral hypoglycemic drugs  Z79.84     3. 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 Essential hypertension  I10     5. Hypertensive retinopathy of both eyes  H35.033     6. Pseudophakia  Z96.1     7. Combined forms of age-related cataract of left eye  H25.812     8. Glaucoma suspect of both eyes  H40.003      1,2. Moderate non-proliferative diabetic retinopathy, both eyes  - s/p IVE OD #1 (06.22.22)--sample, #2 (09.07.22), #3 (  11.14.22), #4 (12.28.22), #5 (02.08.23), IVE #6 (03.15.23), #7 (04.19.23), #8 (05.24.23), #9 (06.28.23), #10 (07.27.23), #11 (08.24.23) -- IVE resistance  - s/p IVV OD #1  (09.21.23), #2 (10.19.23), #3 (11.16.23), #4 (12.14.23), #5 (01.18.24), #6 (02.21.24), #7 (03.27.24), #8 (05.01.24), #9 (06.05.24), #10 (07.09.24), #11 (08.14.24) - previously seen at [facility] by Dr. Trudie Buckler (Fax [phone]) - history of anti-VEGF injections since 2019 -- last injection IVE OD Feb/Mar 2022 -- q89mo interval - FA (11.14.22) shows OD: Focal hyperflourescence IT to fovea; no NV; OS: No NV - exam w/ scattered MA - OCT shows OD: +central DME with persistent central cyst and surrounding cystic changes -- slightly improved; OS: tr persistent cystic changes nasal macula, persistent central VMT at 5 wks - BCVA OD 20/30 -- stable improvement s/p CEIOL; OS 20/20 -- stable. - recommend IVV OD #12 today, 09.18.24 -- f/u in 5 wks again - pt wishes to proceed - RBA of procedure discussed, questions answered - IVV informed consent obtained and signed, 09.21.23 - see procedure note - f/u in 5 wks  -- DFE/OCT, possible injection  3. VMA/VMT OU  - central VMA/VMT OD likely contributing to central cyst  - OS stable  - monitor  4,5. Hypertensive retinopathy OU - discussed importance of tight BP control - monitor  6. Pseudophakia OD  - s/p CE/IOL OD (02.29.24, Dr. Wynelle Link)  - IOL in good position, doing well  - monitor  7. Mixed Cataract OS - The symptoms of cataract, surgical options, and treatments and risks were discussed with patient. - discussed diagnosis and progression - under the expert management of Dr. Wynelle Link  8. Glaucoma suspect  - C/D 0.7 OU  - IOPs okay  - under the expert management of Dr. Wynelle Link   Ophthalmic Meds Ordered this visit:  Meds ordered this encounter  Medications   faricimab-svoa (VABYSMO) 6mg /0.82mL intravitreal injection     Return in about 5 weeks (around 03/29/2023) for f/u NPDR OU, DFE, OCT.  There are no Patient Instructions on file for this visit.  This document serves as a record of services personally performed by Karie Chimera,  MD, PhD. It was created on their behalf by De Blanch, an ophthalmic technician. The creation of this record is the provider's dictation and/or activities during the visit.    Electronically signed by: De Blanch, OA, 02/22/23  12:26 PM  This document serves as a record of services personally performed by Karie Chimera, MD, PhD. It was created on their behalf by Glee Arvin. Manson Passey, OA an ophthalmic technician. The creation of this record is the provider's dictation and/or activities during the visit.    Electronically signed by: Glee Arvin. Manson Passey, OA 02/22/23 12:26 PM  Karie Chimera, M.D., Ph.D. Diseases & Surgery of the Retina and Vitreous [facility]  I have reviewed the above documentation for accuracy and completeness, and I agree with the above. Karie Chimera, M.D., Ph.D. 02/22/23 12:27 PM   Abbreviations: M myopia (nearsighted); A astigmatism; H hyperopia (farsighted); P presbyopia; Mrx spectacle prescription;  CTL contact lenses; OD right eye; OS left eye; OU both eyes  XT exotropia; ET esotropia; PEK punctate epithelial keratitis; PEE punctate epithelial erosions; DES dry eye syndrome; MGD meibomian gland dysfunction; ATs artificial tears; PFAT's preservative free artificial tears; NSC nuclear sclerotic cataract; PSC posterior subcapsular cataract; ERM epi-retinal membrane; PVD posterior vitreous detachment; RD retinal detachment; DM diabetes mellitus; DR diabetic retinopathy; NPDR non-proliferative diabetic retinopathy; PDR proliferative diabetic  retinopathy; CSME clinically significant macular edema; DME diabetic macular edema; dbh dot blot hemorrhages; CWS cotton wool spot; POAG primary open angle glaucoma; C/D cup-to-disc ratio; HVF humphrey visual field; GVF goldmann visual field; OCT optical coherence tomography; IOP intraocular pressure; BRVO Branch retinal vein occlusion; CRVO central retinal vein occlusion; CRAO central retinal artery occlusion;  BRAO branch retinal artery occlusion; RT retinal tear; SB scleral buckle; PPV pars plana vitrectomy; VH Vitreous hemorrhage; PRP panretinal laser photocoagulation; IVK intravitreal kenalog; VMT vitreomacular traction; MH Macular hole;  NVD neovascularization of the disc; NVE neovascularization elsewhere; AREDS age related eye disease study; ARMD age related macular degeneration; POAG primary open angle glaucoma; EBMD epithelial/anterior basement membrane dystrophy; ACIOL anterior chamber intraocular lens; IOL intraocular lens; PCIOL posterior chamber intraocular lens; Phaco/IOL phacoemulsification with intraocular lens placement; PRK photorefractive keratectomy; LASIK laser assisted in situ keratomileusis; HTN hypertension; DM diabetes mellitus; COPD chronic obstructive pulmonary disease

## 2023-02-22 ENCOUNTER — Ambulatory Visit (INDEPENDENT_AMBULATORY_CARE_PROVIDER_SITE_OTHER): Payer: Medicare HMO | Admitting: Ophthalmology

## 2023-02-22 ENCOUNTER — Encounter (INDEPENDENT_AMBULATORY_CARE_PROVIDER_SITE_OTHER): Payer: Self-pay | Admitting: Ophthalmology

## 2023-02-22 DIAGNOSIS — H25812 Combined forms of age-related cataract, left eye: Secondary | ICD-10-CM

## 2023-02-22 DIAGNOSIS — Z7984 Long term (current) use of oral hypoglycemic drugs: Secondary | ICD-10-CM | POA: Diagnosis not present

## 2023-02-22 DIAGNOSIS — H35033 Hypertensive retinopathy, bilateral: Secondary | ICD-10-CM | POA: Diagnosis not present

## 2023-02-22 DIAGNOSIS — E113313 Type 2 diabetes mellitus with moderate nonproliferative diabetic retinopathy with macular edema, bilateral: Secondary | ICD-10-CM | POA: Diagnosis not present

## 2023-02-22 DIAGNOSIS — H43823 Vitreomacular adhesion, bilateral: Secondary | ICD-10-CM | POA: Diagnosis not present

## 2023-02-22 DIAGNOSIS — Z961 Presence of intraocular lens: Secondary | ICD-10-CM

## 2023-02-22 DIAGNOSIS — I10 Essential (primary) hypertension: Secondary | ICD-10-CM | POA: Diagnosis not present

## 2023-02-22 DIAGNOSIS — H40003 Preglaucoma, unspecified, bilateral: Secondary | ICD-10-CM | POA: Diagnosis not present

## 2023-02-22 MED ORDER — FARICIMAB-SVOA 6 MG/0.05ML IZ SOLN
6.0000 mg | INTRAVITREAL | Status: AC | PRN
Start: 2023-02-22 — End: 2023-02-22
  Administered 2023-02-22: 6 mg via INTRAVITREAL

## 2023-03-23 NOTE — Progress Notes (Signed)
[facility] - Clinic Note  03/29/2023     CHIEF COMPLAINT Patient presents for Retina Follow Up  HISTORY OF PRESENT ILLNESS: Erin Mitchell is a 77 y.o. female who presents to the clinic today for:   HPI     Retina Follow Up   Patient presents with  Diabetic Retinopathy.  In both eyes.  Severity is moderate.  Duration of 5 weeks.  Since onset it is stable.  I, the attending physician,  performed the HPI with the patient and updated documentation appropriately.        Comments   Pt here for 5wk ret f/u NPDR OU. Pt states [facility] the same.       Last edited by Rennis Chris, MD on 03/29/2023 12:25 PM.    Pt states she still has distortion in her eyes  Referring physician: Everrett Coombe, DO [address] [address]  [address] [place],  [place] [address]  HISTORICAL INFORMATION:   Selected notes from the MEDICAL RECORD NUMBER Referred by Dr. Everrett Coombe for DM exam LEE:  Ocular Hx- PMH- DM, HTN, afib, kidney disease, last a1c was 6.8 on 05.05.22    CURRENT MEDICATIONS: No current outpatient medications on file. (Ophthalmic Drugs)   No current facility-administered medications for this visit. (Ophthalmic Drugs)   Current Outpatient Medications (Other)  Medication Sig   Alcohol Swabs (DROPSAFE ALCOHOL PREP) 70 % PADS USE AS DIRECTED.   Blood Glucose Monitoring Suppl (TRUE METRIX METER) w/Device KIT Use as directed to test blood glucose daily   Cholecalciferol (VITAMIN D-1000 MAX ST PO) Take 2,000 Units by mouth daily.   clopidogrel (PLAVIX) 75 MG tablet TAKE 1 TABLET EVERY DAY   dapagliflozin propanediol (FARXIGA) 5 MG TABS tablet Take 10 mg by mouth daily.   empagliflozin (JARDIANCE) 25 MG TABS tablet Take 1 tablet (25 mg total) by mouth daily before breakfast.   loperamide (IMODIUM A-D) 2 MG tablet Take 2-4 mg by mouth 4 (four) times daily as needed for diarrhea or loose stools.   losartan (COZAAR) 100 MG tablet TAKE 1 TABLET EVERY DAY    Melatonin 10 MG CAPS Take 10 mg by mouth at bedtime.   metFORMIN (GLUCOPHAGE-XR) 500 MG 24 hr tablet Take 2 tablets (1,000 mg total) by mouth 2 (two) times daily with a meal.   metoprolol tartrate (LOPRESSOR) 25 MG tablet TAKE 1 TABLET TWICE DAILY WITH FOOD   NP THYROID 90 MG tablet Take 1 tablet (90 mg total) by mouth daily.   Probiotic Product (ALIGN) 4 MG CAPS Take 4 mg by mouth daily.   rosuvastatin (CRESTOR) 40 MG tablet TAKE 1 TABLET EVERY DAY   TRUE METRIX BLOOD GLUCOSE TEST test strip USE AS INSTRUCTED   TRUEplus Lancets 33G MISC Use as directed daily.   Zinc Sulfate (ZINC 15 PO) Take by mouth.   No current facility-administered medications for this visit. (Other)   REVIEW OF SYSTEMS: ROS   Positive for: Endocrine, Cardiovascular, Eyes Negative for: Constitutional, Gastrointestinal, Neurological, Skin, Genitourinary, Musculoskeletal, HENT, Respiratory, Psychiatric, Allergic/Imm, Heme/Lymph Last edited by Thompson Grayer, COT on 03/29/2023  8:34 AM.      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   VENTRAL HERNIA REPAIR N/A 12/28/2020   Procedure: LAPAROSCOPIC VENTRAL INCISIONAL HERNIA REPAIR WITH MESH;  Surgeon: Darnell Level, MD;  Location: [facility];  Service: General;  Laterality: N/A;   FAMILY HISTORY Family History  Problem Relation Age of Onset   Hypertension Father    Diabetes Brother    Emphysema Mother    SOCIAL HISTORY Social History   Tobacco Use   Smoking status: Former    Current packs/day: 0.00    Average  packs/day: 0.3 packs/day for 1 year (0.3 ttl pk-yrs)    Types: Cigarettes    Start date: 06/06/1969    Quit date: 06/06/1970    Years since quitting: 52.8   Smokeless tobacco: Never  Vaping Use   Vaping status: Never Used  Substance Use Topics   Alcohol use: Never   Drug use: Never       OPHTHALMIC EXAM: Base Eye Exam     Visual Acuity (Snellen - Linear)       Right Left   Dist cc 20/30 20/20   Dist ph cc NI     Correction: Glasses         Tonometry (Tonopen, 8:39 AM)       Right Left   Pressure 10 11         Pupils       Pupils Dark Light Shape React APD   Right PERRL 2 1 Round Brisk None   Left PERRL 2 1 Round Brisk None         Visual Fields (Counting fingers)       Left Right    Full Full         Extraocular Movement       Right Left    Full, Ortho Full, Ortho         Neuro/Psych     Oriented x3: Yes   Mood/Affect: Normal         Dilation     Both eyes: 1.0% Mydriacyl, 2.5% Phenylephrine @ 8:41 AM           Slit Lamp and Fundus Exam     Slit Lamp Exam       Right Left   Lids/Lashes Dermatochalasis - upper lid, Meibomian gland dysfunction Dermatochalasis - upper lid, Meibomian gland dysfunction   Conjunctiva/Sclera White and quiet White and quiet   Cornea arcus, trace tear film debris, well healed cataract wound arcus, trace PEE, mild tear film debris   Anterior Chamber Moderate depth, clear, very narrow temporal angles Moderate depth, clear, very narrow temporal angles   Iris Round and moderately dilated, mild anterior bowing Round and moderately dilated, mild anterior bowing   Lens PC IOL in good position 2-3+ Nuclear sclerosis with brunescence, 2-3+ Cortical cataract   Anterior Vitreous Vitreous syneresis, Posterior vitreous detachment, +vitreous condensations Vitreous syneresis, Posterior vitreous detachment, vitreous condensations         Fundus Exam       Right Left   Disc Trace pallor, Sharp rim, +cupping Pink and  Sharp, +cupping   C/D Ratio 0.7 0.7   Macula Blunted foveal reflex, central cystic changes / edema -- slightly improved, RPE mottling, fine drusen, pigment clumping, cluster of  MA temporal to fovea and temporal macula -- improved, mild ERM, punctate central exudation Flat, Blunted foveal reflex, RPE mottling, focal MA temp mac, no exudates, cystic changes nasal macula -- stably improved   Vessels attenuated, Tortuous attenuated, Tortuous   Periphery Attached, mild reticular degeneration, rare MA Attached, mild reticular degeneration, rare MA           Refraction     Wearing Rx       Sphere Cylinder Axis Add   Right -0.50 +0.75 166 +2.75   Left +1.75 Sphere  +2.75    Type: Bifocal           IMAGING AND PROCEDURES  Imaging and Procedures for 03/29/2023  OCT, Retina - OU - Both Eyes       Right Eye Quality was good. Central Foveal Thickness: 299. Progression has improved. Findings include no SRF, abnormal foveal contour, intraretinal hyper-reflective material, epiretinal membrane, intraretinal fluid, vitreomacular adhesion (Persistent central cyst and surrounding cystic changes -- slightly improved).   Left Eye Quality was good. Central Foveal Thickness: 266. Progression has been stable. Findings include no SRF, abnormal foveal contour, intraretinal fluid, vitreous traction, vitreomacular adhesion (persistent central VMT).   Notes *Images captured and stored on drive  Diagnosis / Impression:  OD: +central DME, Persistent central cyst and surrounding cystic changes -- slightly improved OS: persistent central VMT  Clinical management:  See below  Abbreviations: NFP - Normal foveal profile. CME - cystoid macular edema. PED - pigment epithelial detachment. IRF - intraretinal fluid. SRF - subretinal fluid. EZ - ellipsoid zone. ERM - epiretinal membrane. ORA - outer retinal atrophy. ORT - outer retinal tubulation. SRHM - subretinal hyper-reflective material. IRHM - intraretinal  hyper-reflective material      Intravitreal Injection, Pharmacologic Agent - OD - Right Eye       Time Out 03/29/2023. 9:01 AM. Confirmed correct patient, procedure, site, and patient consented.   Anesthesia Topical anesthesia was used. Anesthetic medications included Lidocaine 2%, Proparacaine 0.5%.   Procedure Preparation included 5% betadine to ocular surface, eyelid speculum. A (32g) needle was used.   Injection: 6 mg faricimab-svoa 6 MG/0.05ML   Route: Intravitreal, Site: Right Eye   NDC: [id number], Lot: [id number], Expiration date: 09/03/2024, Waste: 0 mL   Post-op Post injection exam found visual acuity of at least counting fingers. The patient tolerated the procedure well. There were no complications. The patient received written and verbal post procedure care education. Post injection medications were not given.             ASSESSMENT/PLAN:    ICD-10-CM   1. Moderate nonproliferative diabetic retinopathy of both eyes with macular edema associated with type 2 diabetes mellitus (HCC)  E11.3313 OCT, Retina - OU - Both Eyes    Intravitreal Injection, Pharmacologic Agent - OD - Right Eye    faricimab-svoa (VABYSMO) 6mg /0.69mL intravitreal injection    2. Long term (current) use of oral hypoglycemic drugs  Z79.84     3. 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 Essential hypertension  I10     5. Hypertensive retinopathy of both eyes  H35.033     6. Pseudophakia  Z96.1     7. Combined forms of age-related cataract of left eye  H25.812     8. Glaucoma suspect of both eyes  H40.003       1,2. Moderate non-proliferative diabetic retinopathy, both eyes  - s/p IVE OD #1 (06.22.22)--sample, #2 (09.07.22), #3 (11.14.22), #4 (12.28.22), #5 (  02.08.23), IVE #6 (03.15.23), #7 (04.19.23), #8 (05.24.23), #9 (06.28.23), #10 (07.27.23), #11 (08.24.23) -- IVE resistance  - s/p IVV OD #1 (09.21.23), #2 (10.19.23), #3 (11.16.23), #4 (12.14.23), #5 (01.18.24), #6  (02.21.24), #7 (03.27.24), #8 (05.01.24), #9 (06.05.24), #10 (07.09.24), #11 (08.14.24), #12 (09.18.24) - previously seen at [facility] by Dr. Trudie Buckler (Fax [phone]) - history of anti-VEGF injections since 2019 -- last injection IVE OD Feb/Mar 2022 -- q26mo interval - FA (11.14.22) shows OD: Focal hyperflourescence IT to fovea; no NV; OS: No NV - exam w/ scattered MA - OCT shows OD: +central DME, Persistent central cyst and surrounding cystic changes -- slightly improved; OS: persistent central VMT at 5 wks - BCVA OD 20/30 -- stable improvement s/p CEIOL; OS 20/20 -- stable. - recommend IVV OD #13 today, 10.23.24 -- f/u in 5 wks again - pt wishes to proceed - RBA of procedure discussed, questions answered - IVV informed consent obtained and signed, 09.21.23 - see procedure note - f/u in 5 wks  -- DFE/OCT, possible injection  3. VMA/VMT OU  - central VMA/VMT OD likely contributing to central cyst  - OS stable  - monitor  4,5. Hypertensive retinopathy OU - discussed importance of tight BP control - monitor  6. Pseudophakia OD  - s/p CE/IOL OD (02.29.24, Dr. Wynelle Link)  - IOL in good position, doing well  - monitor  7. Mixed Cataract OS - The symptoms of cataract, surgical options, and treatments and risks were discussed with patient. - discussed diagnosis and progression - under the expert management of Dr. Wynelle Link  8. Glaucoma suspect  - C/D 0.7 OU  - IOPs okay  - under the expert management of Dr. Wynelle Link   Ophthalmic Meds Ordered this visit:  Meds ordered this encounter  Medications   faricimab-svoa (VABYSMO) 6mg /0.36mL intravitreal injection     Return in about 5 weeks (around 05/03/2023) for f/u NPDR OU, DFE, OCT.  There are no Patient Instructions on file for this visit.  This document serves as a record of services personally performed by Karie Chimera, MD, PhD. It was created on their behalf by De Blanch, an ophthalmic technician. The creation of  this record is the provider's dictation and/or activities during the visit.    Electronically signed by: De Blanch, OA, 03/29/23  12:29 PM  This document serves as a record of services personally performed by Karie Chimera, MD, PhD. It was created on their behalf by Glee Arvin. Manson Passey, OA an ophthalmic technician. The creation of this record is the provider's dictation and/or activities during the visit.    Electronically signed by: Glee Arvin. Manson Passey, OA 03/29/23 12:29 PM  Karie Chimera, M.D., Ph.D. Diseases & Surgery of the Retina and Vitreous [facility]  I have reviewed the above documentation for accuracy and completeness, and I agree with the above. Karie Chimera, M.D., Ph.D. 03/29/23 12:29 PM   Abbreviations: M myopia (nearsighted); A astigmatism; H hyperopia (farsighted); P presbyopia; Mrx spectacle prescription;  CTL contact lenses; OD right eye; OS left eye; OU both eyes  XT exotropia; ET esotropia; PEK punctate epithelial keratitis; PEE punctate epithelial erosions; DES dry eye syndrome; MGD meibomian gland dysfunction; ATs artificial tears; PFAT's preservative free artificial tears; NSC nuclear sclerotic cataract; PSC posterior subcapsular cataract; ERM epi-retinal membrane; PVD posterior vitreous detachment; RD retinal detachment; DM diabetes mellitus; DR diabetic retinopathy; NPDR non-proliferative diabetic retinopathy; PDR proliferative diabetic retinopathy; CSME clinically significant macular edema; DME diabetic macular  edema; dbh dot blot hemorrhages; CWS cotton wool spot; POAG primary open angle glaucoma; C/D cup-to-disc ratio; HVF humphrey visual field; GVF goldmann visual field; OCT optical coherence tomography; IOP intraocular pressure; BRVO Branch retinal vein occlusion; CRVO central retinal vein occlusion; CRAO central retinal artery occlusion; BRAO branch retinal artery occlusion; RT retinal tear; SB scleral buckle; PPV pars plana vitrectomy; VH  Vitreous hemorrhage; PRP panretinal laser photocoagulation; IVK intravitreal kenalog; VMT vitreomacular traction; MH Macular hole;  NVD neovascularization of the disc; NVE neovascularization elsewhere; AREDS age related eye disease study; ARMD age related macular degeneration; POAG primary open angle glaucoma; EBMD epithelial/anterior basement membrane dystrophy; ACIOL anterior chamber intraocular lens; IOL intraocular lens; PCIOL posterior chamber intraocular lens; Phaco/IOL phacoemulsification with intraocular lens placement; PRK photorefractive keratectomy; LASIK laser assisted in situ keratomileusis; HTN hypertension; DM diabetes mellitus; COPD chronic obstructive pulmonary disease

## 2023-03-29 ENCOUNTER — Ambulatory Visit (INDEPENDENT_AMBULATORY_CARE_PROVIDER_SITE_OTHER): Payer: Medicare HMO | Admitting: Ophthalmology

## 2023-03-29 ENCOUNTER — Encounter (INDEPENDENT_AMBULATORY_CARE_PROVIDER_SITE_OTHER): Payer: Self-pay | Admitting: Ophthalmology

## 2023-03-29 DIAGNOSIS — Z7984 Long term (current) use of oral hypoglycemic drugs: Secondary | ICD-10-CM | POA: Diagnosis not present

## 2023-03-29 DIAGNOSIS — Z961 Presence of intraocular lens: Secondary | ICD-10-CM

## 2023-03-29 DIAGNOSIS — H43823 Vitreomacular adhesion, bilateral: Secondary | ICD-10-CM | POA: Diagnosis not present

## 2023-03-29 DIAGNOSIS — E113313 Type 2 diabetes mellitus with moderate nonproliferative diabetic retinopathy with macular edema, bilateral: Secondary | ICD-10-CM

## 2023-03-29 DIAGNOSIS — H40003 Preglaucoma, unspecified, bilateral: Secondary | ICD-10-CM | POA: Diagnosis not present

## 2023-03-29 DIAGNOSIS — H35033 Hypertensive retinopathy, bilateral: Secondary | ICD-10-CM

## 2023-03-29 DIAGNOSIS — I10 Essential (primary) hypertension: Secondary | ICD-10-CM | POA: Diagnosis not present

## 2023-03-29 DIAGNOSIS — H25812 Combined forms of age-related cataract, left eye: Secondary | ICD-10-CM

## 2023-03-29 MED ORDER — FARICIMAB-SVOA 6 MG/0.05ML IZ SOSY
6.0000 mg | PREFILLED_SYRINGE | INTRAVITREAL | Status: AC | PRN
Start: 2023-03-29 — End: 2023-03-29
  Administered 2023-03-29: 6 mg via INTRAVITREAL

## 2023-04-07 ENCOUNTER — Telehealth: Payer: Self-pay | Admitting: Family Medicine

## 2023-04-07 NOTE — Telephone Encounter (Signed)
Patient called she says that she has been taking her [place] 10mg  its work well for her but she is concerned because she has developed a yeast infection  Please advise

## 2023-04-11 ENCOUNTER — Telehealth: Payer: Self-pay

## 2023-04-11 MED ORDER — DAPAGLIFLOZIN PROPANEDIOL 5 MG PO TABS: 10.0000 mg | ORAL_TABLET | Freq: Every day | ORAL | 5 refills | Status: DC

## 2023-04-11 NOTE — Telephone Encounter (Signed)
Rcvd note concerning patient's use of Farxiga and current yeast infection. Pt states she likes the results of the Farxiga on her daily numbers. She would like to continue [place] and advise if any additional yeast infections. Previous infection was resolved with OTC Monistat.

## 2023-04-27 NOTE — Progress Notes (Signed)
[facility] - Clinic Note  05/03/2023     CHIEF COMPLAINT Patient presents for Retina Follow Up  HISTORY OF PRESENT ILLNESS: Erin Mitchell is a 77 y.o. female who presents to the clinic today for:   HPI     Retina Follow Up   Patient presents with  Diabetic Retinopathy.  In both eyes.  This started 5 weeks ago.  I, the attending physician,  performed the HPI with the patient and updated documentation appropriately.        Comments   Patient here for 5 weeks retina follow up for NPDR OU. Patient states vision doing ok. Still some distortion. No eye pain.       Last edited by Rennis Chris, MD on 05/03/2023 12:00 PM.    Pt states for about 3 days, she had tears coming out of her right eye, she states it cleared up as quickly as it came, she is still seeing distortion OD  Referring physician: Everrett Coombe, DO [address]  [address] [place],  [place] [address]  HISTORICAL INFORMATION:   Selected notes from the MEDICAL RECORD NUMBER Referred by Dr. Everrett Coombe for DM exam LEE:  Ocular Hx- PMH- DM, HTN, afib, kidney disease, last a1c was 6.8 on 05.05.22    CURRENT MEDICATIONS: No current outpatient medications on file. (Ophthalmic Drugs)   No current facility-administered medications for this visit. (Ophthalmic Drugs)   Current Outpatient Medications (Other)  Medication Sig   Alcohol Swabs (DROPSAFE ALCOHOL PREP) 70 % PADS USE AS DIRECTED.   Blood Glucose Monitoring Suppl (TRUE METRIX METER) w/Device KIT Use as directed to test blood glucose daily   Cholecalciferol (VITAMIN D-1000 MAX ST PO) Take 2,000 Units by mouth daily.   clopidogrel (PLAVIX) 75 MG tablet TAKE 1 TABLET EVERY DAY   dapagliflozin propanediol (FARXIGA) 5 MG TABS tablet Take 2 tablets (10 mg total) by mouth daily.   empagliflozin (JARDIANCE) 25 MG TABS tablet Take 1 tablet (25 mg total) by mouth daily before breakfast.   loperamide (IMODIUM A-D) 2 MG tablet  Take 2-4 mg by mouth 4 (four) times daily as needed for diarrhea or loose stools.   losartan (COZAAR) 100 MG tablet TAKE 1 TABLET EVERY DAY   Melatonin 10 MG CAPS Take 10 mg by mouth at bedtime.   metFORMIN (GLUCOPHAGE-XR) 500 MG 24 hr tablet Take 2 tablets (1,000 mg total) by mouth 2 (two) times daily with a meal.   metoprolol tartrate (LOPRESSOR) 25 MG tablet TAKE 1 TABLET TWICE DAILY WITH FOOD   NP THYROID 90 MG tablet Take 1 tablet (90 mg total) by mouth daily.   Probiotic Product (ALIGN) 4 MG CAPS Take 4 mg by mouth daily.   rosuvastatin (CRESTOR) 40 MG tablet TAKE 1 TABLET EVERY DAY   TRUE METRIX BLOOD GLUCOSE TEST test strip USE AS INSTRUCTED   TRUEplus Lancets 33G MISC Use as directed daily.   Zinc Sulfate (ZINC 15 PO) Take by mouth.   No current facility-administered medications for this visit. (Other)   REVIEW OF SYSTEMS: ROS   Positive for: Endocrine, Cardiovascular, Eyes Negative for: Constitutional, Gastrointestinal, Neurological, Skin, Genitourinary, Musculoskeletal, HENT, Respiratory, Psychiatric, Allergic/Imm, Heme/Lymph Last edited by Laddie Aquas, COA on 05/03/2023  8:43 AM.       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   VENTRAL HERNIA REPAIR N/A 12/28/2020   Procedure: LAPAROSCOPIC VENTRAL INCISIONAL HERNIA REPAIR WITH MESH;  Surgeon: Darnell Level, MD;  Location: [facility];  Service: General;  Laterality: N/A;   FAMILY HISTORY Family History  Problem Relation Age of Onset   Hypertension Father    Diabetes  Brother    Emphysema Mother    SOCIAL HISTORY Social History   Tobacco Use   Smoking status: Former    Current packs/day: 0.00    Average packs/day: 0.3 packs/day for 1 year (0.3 ttl pk-yrs)    Types: Cigarettes    Start date: 06/06/1969    Quit date: 06/06/1970    Years since quitting: 52.9   Smokeless tobacco: Never  Vaping Use   Vaping status: Never Used  Substance Use Topics   Alcohol use: Never   Drug use: Never       OPHTHALMIC EXAM: Base Eye Exam     Visual Acuity (Snellen - Linear)       Right Left   Dist cc 20/30 20/20    Correction: Glasses         Tonometry (Tonopen, 8:42 AM)       Right Left   Pressure 10 15         Pupils       Dark Light Shape React APD   Right 2 1 Round Brisk None   Left 2 1 Round Brisk None         Visual Fields (Counting fingers)       Left Right    Full Full         Extraocular Movement       Right Left    Full, Ortho Full, Ortho         Neuro/Psych     Oriented x3: Yes   Mood/Affect: Normal         Dilation     Both eyes: 1.0% Mydriacyl, 2.5% Phenylephrine @ 8:42 AM           Slit Lamp and Fundus Exam     Slit Lamp Exam       Right Left   Lids/Lashes Dermatochalasis - upper lid, Meibomian gland dysfunction Dermatochalasis - upper lid, Meibomian gland dysfunction   Conjunctiva/Sclera White and quiet White and quiet   Cornea arcus, trace tear film debris, well healed cataract wound arcus, trace PEE, mild tear film debris   Anterior Chamber Moderate depth, clear, very narrow temporal angles Moderate depth, clear, very narrow temporal angles   Iris Round and moderately dilated, mild anterior bowing Round and moderately dilated, mild anterior bowing   Lens PC IOL in good position 2-3+ Nuclear sclerosis with brunescence, 2-3+ Cortical cataract   Anterior Vitreous Vitreous syneresis, Posterior vitreous detachment, +vitreous condensations Vitreous syneresis, Posterior vitreous detachment, vitreous  condensations         Fundus Exam       Right Left   Disc Trace pallor, Sharp rim, +cupping Pink and Sharp, +cupping   C/D Ratio 0.7 0.7   Macula Blunted foveal reflex,  central cystic changes / edema, RPE mottling, fine drusen, pigment clumping, cluster of MA temporal to fovea and temporal macula -- stably improved, mild ERM, punctate central exudation Flat, Blunted foveal reflex, RPE mottling, focal MA temp mac, no exudates, cystic changes nasal macula -- stably improved   Vessels attenuated, mild tortuosity attenuated, Tortuous   Periphery Attached, mild reticular degeneration, rare MA Attached, mild reticular degeneration, rare MA           Refraction     Wearing Rx       Sphere Cylinder Axis Add   Right -0.50 +0.75 166 +2.75   Left +1.75 Sphere  +2.75    Type: Bifocal           IMAGING AND PROCEDURES  Imaging and Procedures for 05/03/2023  OCT, Retina - OU - Both Eyes       Right Eye Quality was good. Central Foveal Thickness: 304. Progression has been stable. Findings include no SRF, abnormal foveal contour, intraretinal hyper-reflective material, epiretinal membrane, intraretinal fluid, vitreomacular adhesion (Persistent central cyst and surrounding cystic changes ).   Left Eye Quality was good. Central Foveal Thickness: 266. Progression has been stable. Findings include no SRF, abnormal foveal contour, intraretinal fluid, vitreous traction, vitreomacular adhesion (persistent central VMT).   Notes *Images captured and stored on drive  Diagnosis / Impression:  OD: +central DME, Persistent central cyst and surrounding cystic changes  OS: persistent central VMT  Clinical management:  See below  Abbreviations: NFP - Normal foveal profile. CME - cystoid macular edema. PED - pigment epithelial detachment. IRF - intraretinal fluid. SRF - subretinal fluid. EZ - ellipsoid zone. ERM - epiretinal membrane. ORA - outer retinal atrophy. ORT - outer retinal tubulation.  SRHM - subretinal hyper-reflective material. IRHM - intraretinal hyper-reflective material      Intravitreal Injection, Pharmacologic Agent - OD - Right Eye       Time Out 05/03/2023. 9:42 AM. Confirmed correct patient, procedure, site, and patient consented.   Anesthesia Topical anesthesia was used. Anesthetic medications included Lidocaine 2%, Proparacaine 0.5%.   Procedure Preparation included 5% betadine to ocular surface, eyelid speculum. A (32g) needle was used.   Injection: 6 mg faricimab-svoa 6 MG/0.05ML   Route: Intravitreal, Site: Right Eye   NDC: 02725-366-44, Lot: [id number], Expiration date: 04/05/2024, Waste: 0 mL   Post-op Post injection exam found visual acuity of at least counting fingers. The patient tolerated the procedure well. There were no complications. The patient received written and verbal post procedure care education. Post injection medications were not given.              ASSESSMENT/PLAN:    ICD-10-CM   1. Moderate nonproliferative diabetic retinopathy of both eyes with macular edema associated with type 2 diabetes mellitus (HCC)  E11.3313 OCT, Retina - OU - Both Eyes    Intravitreal Injection, Pharmacologic Agent - OD - Right Eye    faricimab-svoa (VABYSMO) 6mg /0.30mL intravitreal injection    2. Long term (current) use of oral hypoglycemic drugs  Z79.84     3. 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 Essential hypertension  I10     5. Hypertensive retinopathy of both eyes  H35.033     6. Pseudophakia  Z96.1     7. Combined forms of age-related cataract of left eye  H25.812     8. Glaucoma suspect of both eyes  H40.003      1,2. Moderate non-proliferative diabetic retinopathy, both eyes  -  s/p IVE OD #1 (06.22.22)--sample, #2 (09.07.22), #3 (11.14.22), #4 (12.28.22), #5 (02.08.23), IVE #6 (03.15.23), #7 (04.19.23), #8 (05.24.23), #9 (06.28.23), #10 (07.27.23), #11 (08.24.23) -- IVE resistance  - s/p IVV OD #1 (09.21.23),  #2 (10.19.23), #3 (11.16.23), #4 (12.14.23), #5 (01.18.24), #6 (02.21.24), #7 (03.27.24), #8 (05.01.24), #9 (06.05.24), #10 (07.09.24), #11 (08.14.24), #12 (09.18.24), #13 (10.23.24) - previously seen at [facility] by Dr. Trudie Buckler (Fax [phone]) - history of anti-VEGF injections since 2019 -- last injection IVE OD Feb/Mar 2022 -- q13mo interval - FA (11.14.22) shows OD: Focal hyperflourescence IT to fovea; no NV; OS: No NV - exam w/ scattered MA - OCT shows OD: +central DME, Persistent central cyst and surrounding cystic changes; OS: persistent central VMT at 5 wks - BCVA OD 20/30 -- stable improvement s/p CEIOL; OS 20/20 -- stable. - recommend IVV OD #14 today, 11.27.24 -- f/u in 5 wks again - pt wishes to proceed - RBA of procedure discussed, questions answered - IVV informed consent obtained and signed, 09.21.23 - see procedure note - f/u in 5 wks  -- DFE/OCT, possible injection  3. VMA/VMT OU  - central VMA/VMT OD likely contributing to central cyst  - OS stable  - monitor  4,5. Hypertensive retinopathy OU - discussed importance of tight BP control - monitor  6. Pseudophakia OD  - s/p CE/IOL OD (02.29.24, Dr. Wynelle Link)  - IOL in good position, doing well  - monitor  7. Mixed Cataract OS - The symptoms of cataract, surgical options, and treatments and risks were discussed with patient. - discussed diagnosis and progression - under the expert management of Dr. Wynelle Link  8. Glaucoma suspect  - C/D 0.7 OU  - IOPs okay  - under the expert management of Dr. Wynelle Link   Ophthalmic Meds Ordered this visit:  Meds ordered this encounter  Medications   faricimab-svoa (VABYSMO) 6mg /0.77mL intravitreal injection     Return in about 5 weeks (around 06/07/2023) for f/u NPDR OU, DFE, OCT.  There are no Patient Instructions on file for this visit.  This document serves as a record of services personally performed by Karie Chimera, MD, PhD. It was created on their behalf by  De Blanch, an ophthalmic technician. The creation of this record is the provider's dictation and/or activities during the visit.    Electronically signed by: De Blanch, OA, 05/03/23  12:01 PM  This document serves as a record of services personally performed by Karie Chimera, MD, PhD. It was created on their behalf by Glee Arvin. Manson Passey, OA an ophthalmic technician. The creation of this record is the provider's dictation and/or activities during the visit.    Electronically signed by: Glee Arvin. Manson Passey, OA 05/03/23 12:01 PM  Karie Chimera, M.D., Ph.D. Diseases & Surgery of the Retina and Vitreous [facility]  I have reviewed the above documentation for accuracy and completeness, and I agree with the above. Karie Chimera, M.D., Ph.D. 05/03/23 12:01 PM  Abbreviations: M myopia (nearsighted); A astigmatism; H hyperopia (farsighted); P presbyopia; Mrx spectacle prescription;  CTL contact lenses; OD right eye; OS left eye; OU both eyes  XT exotropia; ET esotropia; PEK punctate epithelial keratitis; PEE punctate epithelial erosions; DES dry eye syndrome; MGD meibomian gland dysfunction; ATs artificial tears; PFAT's preservative free artificial tears; NSC nuclear sclerotic cataract; PSC posterior subcapsular cataract; ERM epi-retinal membrane; PVD posterior vitreous detachment; RD retinal detachment; DM diabetes mellitus; DR diabetic retinopathy; NPDR non-proliferative diabetic retinopathy; PDR proliferative  diabetic retinopathy; CSME clinically significant macular edema; DME diabetic macular edema; dbh dot blot hemorrhages; CWS cotton wool spot; POAG primary open angle glaucoma; C/D cup-to-disc ratio; HVF humphrey visual field; GVF goldmann visual field; OCT optical coherence tomography; IOP intraocular pressure; BRVO Branch retinal vein occlusion; CRVO central retinal vein occlusion; CRAO central retinal artery occlusion; BRAO branch retinal artery occlusion; RT  retinal tear; SB scleral buckle; PPV pars plana vitrectomy; VH Vitreous hemorrhage; PRP panretinal laser photocoagulation; IVK intravitreal kenalog; VMT vitreomacular traction; MH Macular hole;  NVD neovascularization of the disc; NVE neovascularization elsewhere; AREDS age related eye disease study; ARMD age related macular degeneration; POAG primary open angle glaucoma; EBMD epithelial/anterior basement membrane dystrophy; ACIOL anterior chamber intraocular lens; IOL intraocular lens; PCIOL posterior chamber intraocular lens; Phaco/IOL phacoemulsification with intraocular lens placement; PRK photorefractive keratectomy; LASIK laser assisted in situ keratomileusis; HTN hypertension; DM diabetes mellitus; COPD chronic obstructive pulmonary disease

## 2023-05-03 ENCOUNTER — Ambulatory Visit (INDEPENDENT_AMBULATORY_CARE_PROVIDER_SITE_OTHER): Payer: Medicare HMO | Admitting: Ophthalmology

## 2023-05-03 ENCOUNTER — Encounter (INDEPENDENT_AMBULATORY_CARE_PROVIDER_SITE_OTHER): Payer: Self-pay | Admitting: Ophthalmology

## 2023-05-03 DIAGNOSIS — Z7984 Long term (current) use of oral hypoglycemic drugs: Secondary | ICD-10-CM | POA: Diagnosis not present

## 2023-05-03 DIAGNOSIS — H25812 Combined forms of age-related cataract, left eye: Secondary | ICD-10-CM | POA: Diagnosis not present

## 2023-05-03 DIAGNOSIS — E113313 Type 2 diabetes mellitus with moderate nonproliferative diabetic retinopathy with macular edema, bilateral: Secondary | ICD-10-CM | POA: Diagnosis not present

## 2023-05-03 DIAGNOSIS — H35033 Hypertensive retinopathy, bilateral: Secondary | ICD-10-CM

## 2023-05-03 DIAGNOSIS — H43823 Vitreomacular adhesion, bilateral: Secondary | ICD-10-CM

## 2023-05-03 DIAGNOSIS — H40003 Preglaucoma, unspecified, bilateral: Secondary | ICD-10-CM | POA: Diagnosis not present

## 2023-05-03 DIAGNOSIS — Z961 Presence of intraocular lens: Secondary | ICD-10-CM

## 2023-05-03 DIAGNOSIS — I10 Essential (primary) hypertension: Secondary | ICD-10-CM

## 2023-05-03 MED ORDER — FARICIMAB-SVOA 6 MG/0.05ML IZ SOSY
6.0000 mg | PREFILLED_SYRINGE | INTRAVITREAL | Status: AC | PRN
  Administered 2023-05-03: 6 mg via INTRAVITREAL

## 2023-05-11 ENCOUNTER — Ambulatory Visit (INDEPENDENT_AMBULATORY_CARE_PROVIDER_SITE_OTHER): Payer: Medicare HMO | Admitting: Podiatry

## 2023-05-11 ENCOUNTER — Encounter: Payer: Self-pay | Admitting: Podiatry

## 2023-05-11 DIAGNOSIS — M79674 Pain in right toe(s): Secondary | ICD-10-CM | POA: Diagnosis not present

## 2023-05-11 DIAGNOSIS — I739 Peripheral vascular disease, unspecified: Secondary | ICD-10-CM

## 2023-05-11 DIAGNOSIS — E1152 Type 2 diabetes mellitus with diabetic peripheral angiopathy with gangrene: Secondary | ICD-10-CM | POA: Diagnosis not present

## 2023-05-11 DIAGNOSIS — B351 Tinea unguium: Secondary | ICD-10-CM | POA: Diagnosis not present

## 2023-05-11 DIAGNOSIS — M79675 Pain in left toe(s): Secondary | ICD-10-CM

## 2023-05-11 NOTE — Progress Notes (Signed)
  Subjective:  Patient ID: Erin Mitchell, female    DOB: 1945-11-24,   MRN: [id number]  No chief complaint on file.   77 y.o. female presents for concern of thickened elongated and painful nails that are difficult to trim. Requesting to have them trimmed today. Relates burning and tingling in their feet. Patient is diabetic and last A1c was  Lab Results  Component Value Date   HGBA1C 6.1 01/30/2023   .   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 Also has a history of PAD . Denies any other pedal complaints. Denies n/v/f/c.   PCP: Everrett Coombe MD   Past Medical History:  Diagnosis Date   Atrial fibrillation (HCC)    on Plavix   Cataract    Diabetes (HCC)    Diabetic retinopathy (HCC)    Exposure to Agent Orange    Hypertension    Hypertensive retinopathy    hypothyroidism    Hypothyroidism    Kidney disease     Objective:  Physical Exam: Vascular: DP/PT pulses 2/4 bilateral. CFT <3 seconds. Absent hair growth on digits. No edema.  Skin. No lacerations or abrasions bilateral feet. Nails 1-5 are thickened discolored and elongated with subungual debris.  Right great toe hhealed well no open areas.  Musculoskeletal: MMT 5/5 bilateral lower extremities in DF, PF, Inversion and Eversion. Deceased ROM in DF of ankle joint. Right second and third digits amputated. Severe bunion of right foot.  Neurological: Sensation intact to light touch.   Assessment:   1. Dermatophytosis of nail   2. Pain in toes of both feet   3. PAD (peripheral artery disease) (HCC)   4. Type 2 diabetes mellitus with diabetic peripheral angiopathy and gangrene, without long-term current use of insulin (HCC)           Plan:  Patient was evaluated and treated and all questions answered. -Discussed and educated patient on diabetic foot care, especially with  regards to the vascular, neurological and musculoskeletal systems.   -Stressed the importance of good glycemic control and the detriment of not  controlling glucose levels in relation to the foot. -Discussed supportive shoes at all times and checking feet regularly.  -Mechanically debrided all nails 1-5 bilateral using sterile nail nipper and filed with dremel without incident  -Answered all patient questions -Patient to return  in 3 months for at risk foot care -Patient advised to call the office if any problems or questions arise in the meantime.    Louann Sjogren, DPM

## 2023-05-22 ENCOUNTER — Ambulatory Visit (INDEPENDENT_AMBULATORY_CARE_PROVIDER_SITE_OTHER): Payer: Medicare HMO | Admitting: Family Medicine

## 2023-05-22 ENCOUNTER — Ambulatory Visit: Payer: Self-pay | Admitting: Family Medicine

## 2023-05-22 ENCOUNTER — Encounter: Payer: Self-pay | Admitting: Family Medicine

## 2023-05-22 VITALS — BP 148/65 | HR 70 | Temp 97.9°F | Ht 64.0 in | Wt 162.0 lb

## 2023-05-22 DIAGNOSIS — R21 Rash and other nonspecific skin eruption: Secondary | ICD-10-CM | POA: Diagnosis not present

## 2023-05-22 DIAGNOSIS — Z7984 Long term (current) use of oral hypoglycemic drugs: Secondary | ICD-10-CM

## 2023-05-22 DIAGNOSIS — E1152 Type 2 diabetes mellitus with diabetic peripheral angiopathy with gangrene: Secondary | ICD-10-CM | POA: Diagnosis not present

## 2023-05-22 DIAGNOSIS — I10 Essential (primary) hypertension: Secondary | ICD-10-CM

## 2023-05-22 DIAGNOSIS — K6289 Other specified diseases of anus and rectum: Secondary | ICD-10-CM

## 2023-05-22 DIAGNOSIS — T50905A Adverse effect of unspecified drugs, medicaments and biological substances, initial encounter: Secondary | ICD-10-CM

## 2023-05-22 LAB — POCT GLYCOSYLATED HEMOGLOBIN (HGB A1C): HbA1c, POC (controlled diabetic range): 6.3 % (ref 0.0–7.0)

## 2023-05-22 MED ORDER — FLUCONAZOLE 150 MG PO TABS: 150.0000 mg | ORAL_TABLET | ORAL | 0 refills | Status: DC

## 2023-05-22 MED ORDER — NYSTATIN 100000 UNIT/GM EX POWD: 1.0000 | Freq: Three times a day (TID) | CUTANEOUS | 1 refills | Status: DC

## 2023-05-22 NOTE — Assessment & Plan Note (Signed)
Blood pressure elevated today.  Will have her follow-up with PCP next month.

## 2023-05-22 NOTE — Telephone Encounter (Signed)
Copied from CRM [id number]. Topic: Clinical - Medication Question >> May 22, 2023  7:52 AM Theodis Sato wrote: Reason for CRM: PT states he medication have life threatening side effects and is concerned.   Chief Complaint:   Pt states the medication [name]) may have life threatening effects  and is concerned. "I believe the  Rash  on my butt crack may be gangrene" Symptoms:  States she feels it up her behind . Reports it is painful and itchy. She clarify it is a rash that has 5-6 bump clumped togeth they are pin prick size Frequency:   Started a week ago . Tried neosporin, Capsin powder, hunkers ointmnet, Not . It feels like a rash with   Prickly  rash  up the crack  more itchy than painful.  More uncomfortable to sit  Rates   Pain as 4-5  Disposition: [] ED /[] Urgent Care (no appt availability in office) / [x] Appointment(In office/virtual)/ []  [facility]/ [] Home Care/ [] Refused Recommended Disposition /[] [facility]/ []  Follow-up with PCP Additional Notes:  Patient reports she has yeast. Reason for Disposition  [1] Localized rash is very painful AND [2] no fever  Protocols used: Rash or Redness - Localized-A-AH

## 2023-05-22 NOTE — Assessment & Plan Note (Signed)
A1c actually looks great today but she was on the [place] for about 2-1/2 months.  She says her A1c's looked great while she was on it I suspect she may need to find an alternative and she wants to stay away from that class.  She might do well with a GLP-1.  But again encouraged her to follow-up with PCP to discuss options.  For now continue with metformin and will work on trying to get the current infection cleared up.

## 2023-05-22 NOTE — Progress Notes (Signed)
Established Patient Office Visit  Subjective  Patient ID: Erin Mitchell, female    DOB: [birth year]  Age: 77 y.o. MRN: [id number]  Chief Complaint  Patient presents with   Diabetes    Patient concerned - states is not taking Jardiance but is currently taking [place]- she has a pruritic rash  x 2 weeks that she is concerned could be gangrene due to notes on mfg website .     HPI  She is here today for a rash around the rectal area.  She was originally prescribed Jardiance but it was not covered so then she started [place] about 2 and half months ago.  She says after about 2 weeks of taking it she got a vaginal yeast infection and did a topical antiyeast treatment over-the-counter and she felt like she was able to get that cleared up.  But then more recently noticed some soreness and rash and itching around the rectal area.  She could not see it herself so initially thought it was just hemorrhoids and had some over-the-counter treatments including medicated Vaseline, Neosporin Tucks pads etc.  But then noticed that the rash had kind of spread over the crease.  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  She describes the rash as itchy and irritating.   The only thing that she has tried on the rash is just some powder to absorb moisture.    ROS    Objective:     BP (!) 148/65 (BP Location: Left Arm, Patient Position: Sitting, Cuff Size: Normal)   Pulse 70   Temp 97.9 F (36.6 C)   Ht 5\' 4"  (1.626 m)   Wt 162 lb (73.5 kg)   SpO2 98%   BMI 27.81 kg/m    Physical Exam Vitals and nursing note reviewed.  Constitutional:      Appearance: Normal appearance.  HENT:     Head: Normocephalic and atraumatic.  Eyes:     Conjunctiva/sclera: Conjunctivae normal.  Cardiovascular:     Rate and Rhythm: Normal rate and regular rhythm.   Pulmonary:     Effort: Pulmonary effort is normal.     Breath sounds: Normal breath sounds.  Skin:    General: Skin is warm and dry.     Comments: The buttock crease there is some erythema peeling skin and chafing some areas are little moist and some areas are little more dry and scaly.  No bleeding or open wounds.  It starts all the way at the perineum and goes all the way to the tailbone.  There are a few scattered papules along the edge.  Neurological:     Mental Status: She is alert.  Psychiatric:        Mood and Affect: Mood normal.      Results for orders placed or performed in visit on 05/22/23  POCT HgB A1C  Result Value Ref Range   Hemoglobin A1C     HbA1c POC (<> result, manual entry)     HbA1c, POC (prediabetic range)     HbA1c, POC (controlled diabetic range) 6.3 0.0 - 7.0 %      The ASCVD Risk score (Arnett DK, et al., 2019) failed to calculate for the following reasons:   The valid total cholesterol range is 130 to 320 mg/dL    Assessment &  Plan:   Problem List Items Addressed This Visit       Cardiovascular and Mediastinum   Type 2 diabetes mellitus with diabetic peripheral angiopathy and gangrene, without long-term current use of insulin (HCC)   A1c actually looks great today but she was on the [place] for about 2-1/2 months.  She says her A1c's looked great while she was on it I suspect she may need to find an alternative and she wants to stay away from that class.  She might do well with a GLP-1.  But again encouraged her to follow-up with PCP to discuss options.  For now continue with metformin and will work on trying to get the current infection cleared up.      Relevant Orders   POCT HgB A1C (Completed)   Hypertension   Blood pressure elevated today.  Will have her follow-up with PCP next month.      Other Visit Diagnoses       Medication side effect, initial encounter    -  Primary     Perirectal skin irritation         Rash of genital area        Relevant Medications   fluconazole (DIFLUCAN) 150 MG tablet   nystatin (MYCOSTATIN/NYSTOP) powder       Perineal rash-most likely skin candidiasis.  Will treat with oral Diflucan 1 tab every other day for 3 doses as well as topical nystatin.  If not improving by the end of the week please let us know she has already stopped the [place].  Now that this is her second infection on the medication within less than 3 months I think that is reasonable.  She is also worried about the potential for gangrene.  Follow-up with PCP next month  No follow-ups on file.    Nani Gasser, MD

## 2023-05-23 NOTE — Telephone Encounter (Signed)
Patient seen in office with Dr. Linford Arnold- recommended follow up with PCP in [place].

## 2023-05-25 ENCOUNTER — Other Ambulatory Visit: Payer: Self-pay | Admitting: Family Medicine

## 2023-05-26 ENCOUNTER — Telehealth: Payer: Self-pay

## 2023-05-26 NOTE — Telephone Encounter (Signed)
Copied from CRM [id number]. Topic: Clinical - Prescription Issue >> May 26, 2023  8:10 AM Fonda Kinder J wrote: Reason for CRM: Pt states that her pharmacy is still awaiting an authorization for nystatin (MYCOSTATIN/NYSTOP) and she has been unable to pick up the prescription

## 2023-05-29 NOTE — Progress Notes (Signed)
 [facility] - Clinic Note  06/08/2023     CHIEF COMPLAINT Patient presents for Retina Follow Up  HISTORY OF PRESENT ILLNESS: Erin Mitchell is a 77 y.o. female who presents to the clinic today for:   HPI     Retina Follow Up   Patient presents with  Diabetic Retinopathy.  In both eyes.  This started 5 weeks ago.  Duration of 5 weeks.  I, the attending physician,  performed the HPI with the patient and updated documentation appropriately.        Comments   Patient states that she has noticed its a little harder to read. She is using AT's PRN. Her blood sugar was 117.      Last edited by Valdemar Rogue, MD on 06/08/2023  9:55 AM.     Pt states she is having trouble reading hard copies of books compared to on a Kindle bc of lighting  Referring physician: Alvia Bring, DO [id number] [facility] [address] [place],  [place] [address]  HISTORICAL INFORMATION:   Selected notes from the MEDICAL RECORD NUMBER Referred by Dr. Bring Alvia for DM exam LEE:  Ocular Hx- PMH- DM, HTN, afib, kidney disease, last a1c was 6.8 on 05.05.22    CURRENT MEDICATIONS: No current outpatient medications on file. (Ophthalmic Drugs)   No current facility-administered medications for this visit. (Ophthalmic Drugs)   Current Outpatient Medications (Other)  Medication Sig   Alcohol Swabs (DROPSAFE ALCOHOL PREP) 70 % PADS USE AS DIRECTED.   Blood Glucose Monitoring Suppl (TRUE METRIX METER) w/Device KIT Use as directed to test blood glucose daily   Cholecalciferol (VITAMIN D-1000 MAX ST PO) Take 2,000 Units by mouth daily.   clopidogrel  (PLAVIX ) 75 MG tablet TAKE 1 TABLET EVERY DAY   fluconazole  (DIFLUCAN ) 150 MG tablet Take 1 tablet (150 mg total) by mouth every other day.   loperamide  (IMODIUM  A-D) 2 MG tablet Take 2-4 mg by mouth 4 (four) times daily as needed for diarrhea or loose stools.   losartan  (COZAAR ) 100 MG tablet TAKE 1 TABLET EVERY DAY   Melatonin 10  MG CAPS Take 10 mg by mouth at bedtime.   metFORMIN  (GLUCOPHAGE -XR) 500 MG 24 hr tablet Take 2 tablets (1,000 mg total) by mouth 2 (two) times daily with a meal.   metoprolol  tartrate (LOPRESSOR ) 25 MG tablet TAKE 1 TABLET TWICE DAILY WITH FOOD   NP THYROID  90 MG tablet TAKE 1 TABLET BY MOUTH EVERY DAY *NOT COVERED   nystatin  (MYCOSTATIN /NYSTOP ) powder Apply 1 Application topically 3 (three) times daily.   Probiotic Product (ALIGN) 4 MG CAPS Take 4 mg by mouth daily.   rosuvastatin  (CRESTOR ) 40 MG tablet TAKE 1 TABLET EVERY DAY   TRUE METRIX BLOOD GLUCOSE TEST test strip USE AS INSTRUCTED   TRUEplus Lancets 33G MISC Use as directed daily.   Zinc Sulfate (ZINC 15 PO) Take by mouth.   No current facility-administered medications for this visit. (Other)   REVIEW OF SYSTEMS: ROS   Positive for: Endocrine, Cardiovascular, Eyes Negative for: Constitutional, Gastrointestinal, Neurological, Skin, Genitourinary, Musculoskeletal, HENT, Respiratory, Psychiatric, Allergic/Imm, Heme/Lymph Last edited by Myra Wanda SAILOR, COT on 06/08/2023  8:30 AM.        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   VENTRAL HERNIA REPAIR N/A 12/28/2020   Procedure: LAPAROSCOPIC VENTRAL INCISIONAL HERNIA REPAIR WITH MESH;  Surgeon: Eletha Boas, MD;  Location: [facility];  Service: General;  Laterality: N/A;   FAMILY HISTORY Family History  Problem Relation Age of Onset   Hypertension  Father    Diabetes Brother    Emphysema Mother    SOCIAL HISTORY Social History   Tobacco Use   Smoking status: Former    Current packs/day: 0.00    Average packs/day: 0.3 packs/day for 1 year (0.3 ttl pk-yrs)    Types: Cigarettes    Start date: 06/06/1969    Quit date: 06/06/1970    Years since quitting: 53.0   Smokeless tobacco: Never  Vaping Use   Vaping status: Never Used  Substance Use Topics   Alcohol use: Never   Drug use: Never       OPHTHALMIC EXAM: Base Eye Exam     Visual Acuity (Snellen - Linear)       Right Left   Dist cc 20/30 20/20   Dist ph cc NI     Correction: Glasses         Tonometry (Tonopen, 8:35 AM)       Right Left   Pressure 11 15         Pupils       Dark Light Shape React APD   Right 2 1 Round Brisk None   Left 2 1 Round Brisk None         Visual Fields       Left Right    Full Full         Extraocular Movement       Right Left    Full, Ortho Full, Ortho         Neuro/Psych     Oriented x3: Yes   Mood/Affect: Normal         Dilation     Both eyes: 1.0% Mydriacyl, 2.5% Phenylephrine  @ 8:32 AM           Slit Lamp and Fundus Exam     Slit Lamp Exam       Right Left   Lids/Lashes Dermatochalasis - upper lid, Meibomian gland dysfunction Dermatochalasis - upper lid, Meibomian gland dysfunction   Conjunctiva/Sclera White and quiet White and quiet   Cornea arcus, trace tear film debris, well healed cataract wound arcus, trace PEE, mild tear film debris   Anterior Chamber Moderate depth, clear, very narrow temporal angles Moderate depth, clear, very narrow temporal angles   Iris Round and moderately dilated, mild anterior bowing Round and moderately dilated, mild anterior bowing   Lens PC IOL in good position 2-3+ Nuclear sclerosis with brunescence, 2-3+ Cortical cataract   Anterior Vitreous Vitreous syneresis, Posterior vitreous detachment, +vitreous condensations Vitreous syneresis, Posterior vitreous  detachment, vitreous condensations         Fundus Exam       Right Left   Disc Trace pallor, Sharp rim, +cupping Pink and Sharp, +cupping  C/D Ratio 0.7 0.7   Macula Blunted foveal reflex, central cystic changes / edema, RPE mottling, fine drusen, pigment clumping, cluster of MA temporal to fovea and temporal macula -- stably improved, mild ERM, punctate central exudation Flat, Blunted foveal reflex, RPE mottling, focal MA temp mac, no exudates, cystic changes nasal macula -- stably improved   Vessels attenuated, mild tortuosity attenuated, Tortuous   Periphery Attached, mild reticular degeneration, rare MA Attached, mild reticular degeneration, rare MA           Refraction     Wearing Rx       Sphere Cylinder Axis Add   Right -0.50 +0.75 166 +2.75   Left +1.75 Sphere  +2.75    Type: Bifocal           IMAGING AND PROCEDURES  Imaging and Procedures for 06/08/2023  OCT, Retina - OU - Both Eyes       Right Eye Quality was good. Central Foveal Thickness: 295. Progression has improved. Findings include no SRF, abnormal foveal contour, intraretinal hyper-reflective material, epiretinal membrane, intraretinal fluid, vitreomacular adhesion (Persistent central cyst and surrounding cystic changes -- slightly improved).   Left Eye Quality was good. Central Foveal Thickness: 268. Progression has been stable. Findings include no SRF, abnormal foveal contour, intraretinal fluid, vitreous traction, vitreomacular adhesion (persistent central VMT).   Notes *Images captured and stored on drive  Diagnosis / Impression:  OD: +central DME, Persistent central cyst and surrounding cystic changes -- slightly improved OS: persistent central VMT  Clinical management:  See below  Abbreviations: NFP - Normal foveal profile. CME - cystoid macular edema. PED - pigment epithelial detachment. IRF - intraretinal fluid. SRF - subretinal fluid. EZ - ellipsoid zone. ERM - epiretinal membrane. ORA  - outer retinal atrophy. ORT - outer retinal tubulation. SRHM - subretinal hyper-reflective material. IRHM - intraretinal hyper-reflective material      Intravitreal Injection, Pharmacologic Agent - OD - Right Eye       Time Out 06/08/2023. 9:02 AM. Confirmed correct patient, procedure, site, and patient consented.   Anesthesia Topical anesthesia was used. Anesthetic medications included Lidocaine  2%, Proparacaine 0.5%.   Procedure Preparation included 5% betadine to ocular surface, eyelid speculum. A (32g) needle was used.   Injection: 6 mg faricimab -svoa 6 MG/0.05ML   Route: Intravitreal, Site: Right Eye   NDC: 49757-903-98, Lot: [id number], Expiration date: 12/04/2023, Waste: 0 mL   Post-op Post injection exam found visual acuity of at least counting fingers. The patient tolerated the procedure well. There were no complications. The patient received written and verbal post procedure care education. Post injection medications were not given.   Notes **SAMPLE MEDICATION ADMINISTERED**            ASSESSMENT/PLAN:    ICD-10-CM   1. Moderate nonproliferative diabetic retinopathy of both eyes with macular edema associated with type 2 diabetes mellitus (HCC)  E11.3313 OCT, Retina - OU - Both Eyes    Intravitreal Injection, Pharmacologic Agent - OD - Right Eye    faricimab -svoa (VABYSMO ) 6mg /0.52mL intravitreal injection    2. Long term (current) use of oral hypoglycemic drugs  Z79.84     3. 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 Essential hypertension  I10     5. Hypertensive retinopathy of both eyes  H35.033     6. Pseudophakia  Z96.1     7. Combined forms of age-related cataract of left eye  H25.812     8. Glaucoma suspect of both  eyes  H40.003       1,2. Moderate non-proliferative diabetic retinopathy, both eyes  - s/p IVE OD #1 (06.22.22)--sample, #2 (09.07.22), #3 (11.14.22), #4 (12.28.22), #5 (02.08.23), IVE #6 (03.15.23), #7 (04.19.23), #8  (05.24.23), #9 (06.28.23), #10 (07.27.23), #11 (08.24.23) -- IVE resistance  - s/p IVV OD #1 (09.21.23), #2 (10.19.23), #3 (11.16.23), #4 (12.14.23), #5 (01.18.24), #6 (02.21.24), #7 (03.27.24), #8 (05.01.24), #9 (06.05.24), #10 (07.09.24), #11 (08.14.24), #12 (09.18.24), #13 (10.23.24), #14 (11.27.24) - previously seen at [facility] by Dr. Phyllistine (Fax [phone]) - history of anti-VEGF injections since 2019 -- last injection IVE OD Feb/Mar 2022 -- q81mo interval - FA (11.14.22) shows OD: Focal hyperflourescence IT to fovea; no NV; OS: No NV - exam w/ scattered MA - OCT shows OD: +central DME, Persistent central cyst and surrounding cystic changes; OS: persistent central VMT at 5 wks - BCVA OD 20/30 -- stable improvement s/p CEIOL; OS 20/20 -- stable. - recommend IVV OD #15 (sample) today, 01.02.25 -- w/ f/u in 5 wks again - pt wishes to proceed - RBA of procedure discussed, questions answered - IVV informed consent obtained and signed, 09.21.23 - see procedure note - f/u in 5 wks  -- DFE/OCT, possible injection  3. VMA/VMT OU  - central VMA/VMT OD likely contributing to central cyst  - OS stable  - monitor  4,5. Hypertensive retinopathy OU - discussed importance of tight BP control - monitor  6. Pseudophakia OD  - s/p CE/IOL OD (02.29.24, Dr. Austin)  - IOL in good position, doing well  - monitor  7. Mixed Cataract OS - The symptoms of cataract, surgical options, and treatments and risks were discussed with patient. - discussed diagnosis and progression - under the expert management of Dr. Austin  8. Glaucoma suspect  - C/D 0.7 OU  - IOPs okay  - under the expert management of Dr. Austin  Ophthalmic Meds Ordered this visit:  Meds ordered this encounter  Medications   faricimab -svoa (VABYSMO ) 6mg /0.36mL intravitreal injection     Return in about 5 weeks (around 07/13/2023) for f/u NPDR OU, DFE, OCT.  There are no Patient Instructions on file for this  visit.  This document serves as a record of services personally performed by Redell JUDITHANN Hans, MD, PhD. It was created on their behalf by Auston Muzzy, COMT. The creation of this record is the provider's dictation and/or activities during the visit.  Electronically signed by: Auston Muzzy, COMT 06/10/23 2:20 AM  This document serves as a record of services personally performed by Redell JUDITHANN Hans, MD, PhD. It was created on their behalf by Alan PARAS. Delores, OA an ophthalmic technician. The creation of this record is the provider's dictation and/or activities during the visit.    Electronically signed by: Alan PARAS. Delores, OA 06/10/23 2:20 AM  Redell JUDITHANN Hans, M.D., Ph.D. Diseases & Surgery of the Retina and Vitreous [facility]  I have reviewed the above documentation for accuracy and completeness, and I agree with the above. Redell JUDITHANN Hans, M.D., Ph.D. 06/10/23 2:21 AM   Abbreviations: M myopia (nearsighted); A astigmatism; H hyperopia (farsighted); P presbyopia; Mrx spectacle prescription;  CTL contact lenses; OD right eye; OS left eye; OU both eyes  XT exotropia; ET esotropia; PEK punctate epithelial keratitis; PEE punctate epithelial erosions; DES dry eye syndrome; MGD meibomian gland dysfunction; ATs artificial tears; PFAT's preservative free artificial tears; NSC nuclear sclerotic cataract; PSC posterior subcapsular cataract; ERM epi-retinal membrane; PVD posterior  vitreous detachment; RD retinal detachment; DM diabetes mellitus; DR diabetic retinopathy; NPDR non-proliferative diabetic retinopathy; PDR proliferative diabetic retinopathy; CSME clinically significant macular edema; DME diabetic macular edema; dbh dot blot hemorrhages; CWS cotton wool spot; POAG primary open angle glaucoma; C/D cup-to-disc ratio; HVF humphrey visual field; GVF goldmann visual field; OCT optical coherence tomography; IOP intraocular pressure; BRVO Branch retinal vein occlusion; CRVO central  retinal vein occlusion; CRAO central retinal artery occlusion; BRAO branch retinal artery occlusion; RT retinal tear; SB scleral buckle; PPV pars plana vitrectomy; VH Vitreous hemorrhage; PRP panretinal laser photocoagulation; IVK intravitreal kenalog; VMT vitreomacular traction; MH Macular hole;  NVD neovascularization of the disc; NVE neovascularization elsewhere; AREDS age related eye disease study; ARMD age related macular degeneration; POAG primary open angle glaucoma; EBMD epithelial/anterior basement membrane dystrophy; ACIOL anterior chamber intraocular lens; IOL intraocular lens; PCIOL posterior chamber intraocular lens; Phaco/IOL phacoemulsification with intraocular lens placement; PRK photorefractive keratectomy; LASIK laser assisted in situ keratomileusis; HTN hypertension; DM diabetes mellitus; COPD chronic obstructive pulmonary disease

## 2023-06-01 ENCOUNTER — Other Ambulatory Visit: Payer: Self-pay | Admitting: Family Medicine

## 2023-06-01 DIAGNOSIS — E1152 Type 2 diabetes mellitus with diabetic peripheral angiopathy with gangrene: Secondary | ICD-10-CM

## 2023-06-08 ENCOUNTER — Ambulatory Visit (INDEPENDENT_AMBULATORY_CARE_PROVIDER_SITE_OTHER): Payer: Medicare HMO | Admitting: Ophthalmology

## 2023-06-08 ENCOUNTER — Encounter (INDEPENDENT_AMBULATORY_CARE_PROVIDER_SITE_OTHER): Payer: Self-pay | Admitting: Ophthalmology

## 2023-06-08 DIAGNOSIS — H25812 Combined forms of age-related cataract, left eye: Secondary | ICD-10-CM

## 2023-06-08 DIAGNOSIS — E113313 Type 2 diabetes mellitus with moderate nonproliferative diabetic retinopathy with macular edema, bilateral: Secondary | ICD-10-CM | POA: Diagnosis not present

## 2023-06-08 DIAGNOSIS — Z7984 Long term (current) use of oral hypoglycemic drugs: Secondary | ICD-10-CM | POA: Diagnosis not present

## 2023-06-08 DIAGNOSIS — I10 Essential (primary) hypertension: Secondary | ICD-10-CM | POA: Diagnosis not present

## 2023-06-08 DIAGNOSIS — Z961 Presence of intraocular lens: Secondary | ICD-10-CM

## 2023-06-08 DIAGNOSIS — H40003 Preglaucoma, unspecified, bilateral: Secondary | ICD-10-CM

## 2023-06-08 DIAGNOSIS — H43823 Vitreomacular adhesion, bilateral: Secondary | ICD-10-CM | POA: Diagnosis not present

## 2023-06-08 DIAGNOSIS — H35033 Hypertensive retinopathy, bilateral: Secondary | ICD-10-CM | POA: Diagnosis not present

## 2023-06-08 MED ORDER — FARICIMAB-SVOA 6 MG/0.05ML IZ SOLN
6.0000 mg | INTRAVITREAL | Status: AC | PRN
  Administered 2023-06-08: 6 mg via INTRAVITREAL

## 2023-06-18 ENCOUNTER — Other Ambulatory Visit: Payer: Self-pay | Admitting: Family Medicine

## 2023-06-18 DIAGNOSIS — I739 Peripheral vascular disease, unspecified: Secondary | ICD-10-CM

## 2023-06-18 DIAGNOSIS — I10 Essential (primary) hypertension: Secondary | ICD-10-CM

## 2023-06-18 DIAGNOSIS — I480 Paroxysmal atrial fibrillation: Secondary | ICD-10-CM

## 2023-06-19 ENCOUNTER — Ambulatory Visit: Payer: Medicare HMO | Admitting: Family Medicine

## 2023-06-19 NOTE — Telephone Encounter (Signed)
 Requesting rx rf of  Metoprolol tartrate 25mg   last written 08/30/2022 Losartan 100mg  -last written 08/29/2022 Clopidogrel75mg  - last written 08/30/2022 Last OV 05/22/2023 Upcoming appt 06/27/2023

## 2023-06-26 ENCOUNTER — Other Ambulatory Visit: Payer: Self-pay | Admitting: Family Medicine

## 2023-06-26 DIAGNOSIS — E1152 Type 2 diabetes mellitus with diabetic peripheral angiopathy with gangrene: Secondary | ICD-10-CM

## 2023-06-27 ENCOUNTER — Encounter: Payer: Self-pay | Admitting: Family Medicine

## 2023-06-27 ENCOUNTER — Ambulatory Visit (INDEPENDENT_AMBULATORY_CARE_PROVIDER_SITE_OTHER): Payer: Medicare HMO | Admitting: Family Medicine

## 2023-06-27 VITALS — BP 147/77 | HR 64 | Ht 64.0 in | Wt 163.0 lb

## 2023-06-27 DIAGNOSIS — R21 Rash and other nonspecific skin eruption: Secondary | ICD-10-CM | POA: Insufficient documentation

## 2023-06-27 DIAGNOSIS — I10 Essential (primary) hypertension: Secondary | ICD-10-CM

## 2023-06-27 MED ORDER — METOPROLOL TARTRATE 25 MG PO TABS: 25.0000 mg | ORAL_TABLET | Freq: Two times a day (BID) | ORAL | 3 refills | Status: DC

## 2023-06-27 NOTE — Progress Notes (Signed)
Erin Mitchell [facility] - 78 y.o. female MRN [id number]  Date of birth: Dec 26, 1945  Subjective Chief Complaint  Patient presents with   Follow-up    HPI Erin Mitchell is a 78 y.o. female here today for follow up of rash.  She was seen by Dr. Linford Arnold about 1 month ago for rash on the perineum and buttock area.  She had been on farxiga which was thought to have caused/contributed to the rash.  She was treated with fluconazole and nystatin.  She reports that this rash is resolved at this point.     She does have rash on the back of her neck.  She is using a natural salve on this.  She is unsure what is in this.   BP elevated at last visit.  She is taking losartan daily.  Remains elevated today, similar to last reading.  She has been monitoring at home and readings are much better at home.   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   VENTRAL HERNIA REPAIR N/A 12/28/2020   Procedure: LAPAROSCOPIC VENTRAL INCISIONAL HERNIA REPAIR WITH MESH;  Surgeon: Darnell Level, MD;  Location: [facility];  Service: General;  Laterality: N/A;    Social History   Socioeconomic History   Marital status: Married    Spouse name: Not on file   Number of children: 7   Years of education: Not on file   Highest education level: Not on file  Occupational History   Occupation: Retired  Tobacco Use   Smoking status: Former     Current packs/day: 0.00    Average packs/day: 0.3 packs/day for 1 year (0.3 ttl pk-yrs)    Types: Cigarettes    Start date: 06/06/1969    Quit date: 06/06/1970    Years since quitting: 53.0   Smokeless tobacco: Never  Vaping Use   Vaping status: Never Used  Substance and Sexual Activity   Alcohol use: Never   Drug use: Never   Sexual activity: Not Currently    Partners: Male  Other Topics Concern   Not on file  Social History Narrative   Not on file   Social Drivers of Health   Financial Resource Strain: Not on file  Food Insecurity: Not on file  Transportation Needs: Not on file  Physical Activity: Not on file  Stress: Not on file  Social Connections: Not on file    Family History  Problem Relation Age of Onset   Hypertension Father    Diabetes Brother    Emphysema Mother     Health Maintenance  Topic Date Due   Medicare Annual Wellness (AWV)  Never done   OPHTHALMOLOGY EXAM  09/23/2022   COVID-19 Vaccine (3 - 2024-25 season) 02/05/2023  Zoster Vaccines- Shingrix (1 of 2) 08/20/2023 (Originally 04/12/1998)   FOOT EXAM  10/19/2023   HEMOGLOBIN A1C  11/20/2023   Diabetic kidney evaluation - eGFR measurement  01/30/2024   Diabetic kidney evaluation - Urine ACR  01/30/2024   DTaP/Tdap/Td (2 - Td or Tdap) 08/15/2029   Pneumonia Vaccine 49+ Years old  Completed   INFLUENZA VACCINE  Completed   HPV VACCINES  Aged Out   DEXA SCAN  Discontinued   Colonoscopy  Discontinued   Hepatitis C Screening  Discontinued     ----------------------------------------------------------------------------------------------------------------------------------------------------------------------------------------------------------------- Physical Exam BP (!) 147/77 (BP Location: Left Arm, Patient Position: Sitting, Cuff Size: Normal)   Pulse 64   Ht 5\' 4"  (1.626 m)   Wt 163 lb (73.9 kg)   SpO2 100%   BMI 27.98 kg/m   Physical Exam Constitutional:      Appearance: Normal  appearance.  Eyes:     General: No scleral icterus. Skin:    Comments: Rash on buttock/perineum has resolved.   Erythematous patch on upper chest.   Neurological:     Mental Status: She is alert.  Psychiatric:        Mood and Affect: Mood normal.        Behavior: Behavior normal.     ------------------------------------------------------------------------------------------------------------------------------------------------------------------------------------------------------------------- Assessment and Plan  Rash Rash on buttock has resolved.  Small amount of irritant/contact dermatitis on upper chest.  Recommend d/c OTC salve for now.   Hypertension BP readings at home are much better.  Encouraged to continue home monitoring.    Meds ordered this encounter  Medications   metoprolol tartrate (LOPRESSOR) 25 MG tablet    Sig: Take 1 tablet (25 mg total) by mouth 2 (two) times daily.    Dispense:  180 tablet    Refill:  3    No follow-ups on file.    This visit occurred during the SARS-CoV-2 public health emergency.  Safety protocols were in place, including screening questions prior to the visit, additional usage of staff PPE, and extensive cleaning of exam room while observing appropriate contact time as indicated for disinfecting solutions.

## 2023-06-27 NOTE — Assessment & Plan Note (Signed)
Rash on buttock has resolved.  Small amount of irritant/contact dermatitis on upper chest.  Recommend d/c OTC salve for now.

## 2023-06-27 NOTE — Assessment & Plan Note (Signed)
BP readings at home are much better.  Encouraged to continue home monitoring.

## 2023-06-30 NOTE — Progress Notes (Signed)
 [facility] - Clinic Note  07/12/2023     CHIEF COMPLAINT Patient presents for Retina Follow Up  HISTORY OF PRESENT ILLNESS: Erin Mitchell is a 78 y.o. female who presents to the clinic today for:   HPI     Retina Follow Up   Patient presents with  Diabetic Retinopathy.  In both eyes.  This started 5 weeks ago.  Duration of 5 weeks.  I, the attending physician,  performed the HPI with the patient and updated documentation appropriately.        Comments   Patient states she is unable to tell if the vision has changed. She is feeling like her eyes are dry. She is using AT's. Her blood sugar was 111.      Last edited by Sandy Haye, MD on 07/12/2023 12:04 PM.    Pt states the only complaint she has about her vision is she feels like she has sand in her eyes occasionally, she uses drops and it helps  Referring physician: Alvia Bring, DO [address] [facility] [address]  [address] [place],  [place] [address]  HISTORICAL INFORMATION:   Selected notes from the MEDICAL RECORD NUMBER Referred by Dr. Bring Alvia for DM exam LEE:  Ocular Hx- PMH- DM, HTN, afib, kidney disease, last a1c was 6.8 on 05.05.22    CURRENT MEDICATIONS: No current outpatient medications on file. (Ophthalmic Drugs)   No current facility-administered medications for this visit. (Ophthalmic Drugs)   Current Outpatient Medications (Other)  Medication Sig   Alcohol Swabs (DROPSAFE ALCOHOL PREP) 70 % PADS USE AS DIRECTED.   Blood Glucose Monitoring Suppl (TRUE METRIX METER) w/Device KIT Use as directed to test blood glucose daily   Cholecalciferol (VITAMIN D-1000 MAX ST PO) Take 2,000 Units by mouth daily.   clopidogrel  (PLAVIX ) 75 MG tablet TAKE 1 TABLET EVERY DAY   loperamide  (IMODIUM  A-D) 2 MG tablet Take 2-4 mg by mouth 4 (four) times daily as needed for diarrhea or loose stools.   losartan  (COZAAR ) 100 MG tablet TAKE 1 TABLET EVERY DAY   Melatonin 10 MG CAPS Take 10 mg by  mouth at bedtime.   metFORMIN  (GLUCOPHAGE -XR) 500 MG 24 hr tablet TAKE 2 TABLETS TWICE DAILY WITH MEALS   metoprolol  tartrate (LOPRESSOR ) 25 MG tablet Take 1 tablet (25 mg total) by mouth 2 (two) times daily.   NP THYROID  90 MG tablet TAKE 1 TABLET BY MOUTH EVERY DAY *NOT COVERED   Probiotic Product (ALIGN) 4 MG CAPS Take 4 mg by mouth daily.   rosuvastatin  (CRESTOR ) 40 MG tablet TAKE 1 TABLET EVERY DAY   TRUE METRIX BLOOD GLUCOSE TEST test strip USE AS INSTRUCTED   TRUEplus Lancets 33G MISC Use as directed daily.   Zinc Sulfate (ZINC 15 PO) Take by mouth.   No current facility-administered medications for this visit. (Other)   REVIEW OF SYSTEMS: ROS   Positive for: Endocrine, Cardiovascular, Eyes Negative for: Constitutional, Gastrointestinal, Neurological, Skin, Genitourinary, Musculoskeletal, HENT, Respiratory, Psychiatric, Allergic/Imm, Heme/Lymph Last edited by Myra Wanda SAILOR, COT on 07/12/2023  8:27 AM.     ALLERGIES Allergies  Allergen Reactions   Farxiga  [Dapagliflozin ] Other (See Comments)    Yeast infections     PAST MEDICAL HISTORY Past Medical History:  Diagnosis Date   Atrial fibrillation (HCC)    on Plavix    Cataract    Diabetes (HCC)    Diabetic retinopathy (HCC)    Exposure to Agent Orange    Hypertension  Hypertensive retinopathy    hypothyroidism    Hypothyroidism    Kidney disease    Past Surgical History:  Procedure Laterality Date   APPENDECTOMY     CESAREAN SECTION     CHOLECYSTECTOMY     HERNIA REPAIR     TOE AMPUTATION Right    2nd & 3rd   VENTRAL HERNIA REPAIR N/A 12/28/2020   Procedure: LAPAROSCOPIC VENTRAL INCISIONAL HERNIA REPAIR WITH MESH;  Surgeon: Eletha Boas, MD;  Location: [facility];  Service: General;  Laterality: N/A;   FAMILY HISTORY Family History  Problem Relation Age of Onset   Hypertension Father    Diabetes Brother    Emphysema Mother    SOCIAL HISTORY Social History   Tobacco Use   Smoking status: Former     Current packs/day: 0.00    Average packs/day: 0.3 packs/day for 1 year (0.3 ttl pk-yrs)    Types: Cigarettes    Start date: 06/06/1969    Quit date: 06/06/1970    Years since quitting: 53.1   Smokeless tobacco: Never  Vaping Use   Vaping status: Never Used  Substance Use Topics   Alcohol use: Never   Drug use: Never       OPHTHALMIC EXAM: Base Eye Exam     Visual Acuity (Snellen - Linear)       Right Left   Dist cc 20/30 20/20   Dist ph cc NI     Correction: Glasses         Tonometry (Tonopen, 8:29 AM)       Right Left   Pressure 10 11         Pupils       Dark Light Shape React APD   Right 2 1 Round Brisk None   Left 2 1 Round Brisk None         Visual Fields       Left Right    Full Full         Extraocular Movement       Right Left    Full, Ortho Full, Ortho         Neuro/Psych     Oriented x3: Yes   Mood/Affect: Normal         Dilation     Both eyes: 1.0% Mydriacyl, 2.5% Phenylephrine  @ 8:27 AM           Slit Lamp and Fundus Exam     Slit Lamp Exam       Right Left   Lids/Lashes Dermatochalasis - upper lid, Meibomian gland dysfunction Dermatochalasis - upper lid, Meibomian gland dysfunction   Conjunctiva/Sclera White and quiet White and quiet   Cornea arcus, trace tear film debris, well healed cataract wound arcus, trace PEE, mild tear film debris   Anterior Chamber Moderate depth, clear, very narrow temporal angles Moderate depth, clear, very narrow temporal angles   Iris Round and moderately dilated, mild anterior bowing Round and moderately dilated, mild anterior bowing   Lens PC IOL in good position 2-3+ Nuclear sclerosis with brunescence, 2-3+ Cortical cataract   Anterior Vitreous Vitreous syneresis, Posterior vitreous detachment, +vitreous condensations Vitreous syneresis, Posterior vitreous detachment, vitreous condensations         Fundus Exam       Right Left   Disc Trace pallor, Sharp rim, +cupping Pink and  Sharp, +cupping   C/D Ratio 0.7 0.7   Macula Blunted foveal reflex, central cystic changes / edema, RPE mottling, fine drusen, pigment clumping, cluster  of MA temporal to fovea and temporal macula -- stably improved, mild ERM, punctate central exudation Flat, Blunted foveal reflex, RPE mottling, focal MA temp mac, no exudates, cystic changes nasal macula -- stably improved   Vessels attenuated, Tortuous attenuated, Tortuous   Periphery Attached, mild reticular degeneration, rare MA Attached, mild reticular degeneration, rare MA           Refraction     Wearing Rx       Sphere Cylinder Axis Add   Right -0.50 +0.75 166 +2.75   Left +1.75 Sphere  +2.75    Type: Bifocal           IMAGING AND PROCEDURES  Imaging and Procedures for 07/12/2023  OCT, Retina - OU - Both Eyes       Right Eye Quality was good. Central Foveal Thickness: 301. Progression has improved. Findings include no SRF, abnormal foveal contour, intraretinal hyper-reflective material, epiretinal membrane, intraretinal fluid, vitreomacular adhesion (Persistent central cyst and surrounding cystic changes -- slightly improved).   Left Eye Quality was good. Central Foveal Thickness: 266. Progression has been stable. Findings include no SRF, abnormal foveal contour, intraretinal fluid, vitreous traction, vitreomacular adhesion (persistent central VMT).   Notes *Images captured and stored on drive  Diagnosis / Impression:  OD: +central DME, Persistent central cyst and surrounding cystic changes -- slightly improved OS: persistent central VMT  Clinical management:  See below  Abbreviations: NFP - Normal foveal profile. CME - cystoid macular edema. PED - pigment epithelial detachment. IRF - intraretinal fluid. SRF - subretinal fluid. EZ - ellipsoid zone. ERM - epiretinal membrane. ORA - outer retinal atrophy. ORT - outer retinal tubulation. SRHM - subretinal hyper-reflective material. IRHM - intraretinal  hyper-reflective material      Intravitreal Injection, Pharmacologic Agent - OD - Right Eye       Time Out 07/12/2023. 9:17 AM. Confirmed correct patient, procedure, site, and patient consented.   Anesthesia Topical anesthesia was used. Anesthetic medications included Lidocaine  2%, Proparacaine 0.5%.   Procedure Preparation included 5% betadine to ocular surface, eyelid speculum. A (32g) needle was used.   Injection: 6 mg faricimab -svoa 6 MG/0.05ML (Patient supplied)   Route: Intravitreal, Site: Right Eye   NDC: 49757-903-93, Lot: [id number], Expiration date: 02/02/2025, Waste: 0 mL   Post-op Post injection exam found visual acuity of at least counting fingers. The patient tolerated the procedure well. There were no complications. The patient received written and verbal post procedure care education. Post injection medications were not given.   Notes **SAMPLE MEDICATION ADMINISTERED**            ASSESSMENT/PLAN:    ICD-10-CM   1. Moderate nonproliferative diabetic retinopathy of both eyes with macular edema associated with type 2 diabetes mellitus (HCC)  E11.3313 OCT, Retina - OU - Both Eyes    Intravitreal Injection, Pharmacologic Agent - OD - Right Eye    faricimab -svoa (VABYSMO ) 6mg /0.12mL intravitreal injection    2. Long term (current) use of oral hypoglycemic drugs  Z79.84     3. 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 Essential hypertension  I10     5. Hypertensive retinopathy of both eyes  H35.033     6. Pseudophakia  Z96.1     7. Combined forms of age-related cataract of left eye  H25.812     8. Glaucoma suspect of both eyes  H40.003      1,2. Moderate non-proliferative diabetic retinopathy, both eyes  - A1c: 6.3 on 12.16.24  -  s/p IVE OD #1 (06.22.22)--sample, #2 (09.07.22), #3 (11.14.22), #4 (12.28.22), #5 (02.08.23), IVE #6 (03.15.23), #7 (04.19.23), #8 (05.24.23), #9 (06.28.23), #10 (07.27.23), #11 (08.24.23) -- IVE resistance  - s/p IVV  OD #1 (09.21.23), #2 (10.19.23), #3 (11.16.23), #4 (12.14.23), #5 (01.18.24), #6 (02.21.24), #7 (03.27.24), #8 (05.01.24), #9 (06.05.24), #10 (07.09.24), #11 (08.14.24), #12 (09.18.24), #13 (10.23.24), #14 (11.27.24), #15 (sample -- 01.02.25) - previously seen at [facility] by Dr. Phyllistine (Fax [phone]) - history of anti-VEGF injections since 2019 -- last injection IVE OD Feb/Mar 2022 -- q18mo interval - FA (11.14.22) shows OD: Focal hyperflourescence IT to fovea; no NV; OS: No NV - exam w/ scattered MA - OCT shows OD: +central DME, Persistent central cyst and surrounding cystic changes--slightly improved; OS: persistent central VMT at 5 wks - BCVA OD 20/30 -- stable improvement s/p CEIOL; OS 20/20 -- stable. - recommend IVV OD #16 (sample) today, 02.05.25 -- w/ f/u extended to 6 wks - pt wishes to proceed - RBA of procedure discussed, questions answered - IVV informed consent obtained and signed, 02.05.25 - see procedure note - f/u in 6 wks  -- DFE/OCT, possible injection  3. VMA/VMT OU  - central VMA/VMT OD likely contributing to central cyst  - OS stable  - monitor  4,5. Hypertensive retinopathy OU - discussed importance of tight BP control - monitor  6. Pseudophakia OD  - s/p CE/IOL OD (02.29.24, Dr. Austin)  - IOL in good position, doing well  - monitor  7. Mixed Cataract OS - The symptoms of cataract, surgical options, and treatments and risks were discussed with patient. - discussed diagnosis and progression - under the expert management of Dr. Austin  8. Glaucoma suspect  - C/D 0.7 OU  - IOPs okay  - under the expert management of Dr. Austin  Ophthalmic Meds Ordered this visit:  Meds ordered this encounter  Medications   faricimab -svoa (VABYSMO ) 6mg /0.39mL intravitreal injection     Return in about 6 weeks (around 08/23/2023) for f/u NPDR OU, DFE, OCT, Possible Injxn.  There are no Patient Instructions on file for this visit.  This document serves as a  record of services personally performed by Redell JUDITHANN Hans, MD, PhD. It was created on their behalf by Alan PARAS. Delores, OA an ophthalmic technician. The creation of this record is the provider's dictation and/or activities during the visit.    Electronically signed by: Alan PARAS. Delores, OA 07/12/23 12:05 PM   Redell JUDITHANN Hans, M.D., Ph.D. Diseases & Surgery of the Retina and Vitreous [facility]  I have reviewed the above documentation for accuracy and completeness, and I agree with the above. Redell JUDITHANN Hans, M.D., Ph.D. 07/12/23 12:23 PM   Abbreviations: M myopia (nearsighted); A astigmatism; H hyperopia (farsighted); P presbyopia; Mrx spectacle prescription;  CTL contact lenses; OD right eye; OS left eye; OU both eyes  XT exotropia; ET esotropia; PEK punctate epithelial keratitis; PEE punctate epithelial erosions; DES dry eye syndrome; MGD meibomian gland dysfunction; ATs artificial tears; PFAT's preservative free artificial tears; NSC nuclear sclerotic cataract; PSC posterior subcapsular cataract; ERM epi-retinal membrane; PVD posterior vitreous detachment; RD retinal detachment; DM diabetes mellitus; DR diabetic retinopathy; NPDR non-proliferative diabetic retinopathy; PDR proliferative diabetic retinopathy; CSME clinically significant macular edema; DME diabetic macular edema; dbh dot blot hemorrhages; CWS cotton wool spot; POAG primary open angle glaucoma; C/D cup-to-disc ratio; HVF humphrey visual field; GVF goldmann visual field; OCT optical coherence tomography; IOP intraocular pressure; BRVO Branch  retinal vein occlusion; CRVO central retinal vein occlusion; CRAO central retinal artery occlusion; BRAO branch retinal artery occlusion; RT retinal tear; SB scleral buckle; PPV pars plana vitrectomy; VH Vitreous hemorrhage; PRP panretinal laser photocoagulation; IVK intravitreal kenalog; VMT vitreomacular traction; MH Macular hole;  NVD neovascularization of the disc; NVE  neovascularization elsewhere; AREDS age related eye disease study; ARMD age related macular degeneration; POAG primary open angle glaucoma; EBMD epithelial/anterior basement membrane dystrophy; ACIOL anterior chamber intraocular lens; IOL intraocular lens; PCIOL posterior chamber intraocular lens; Phaco/IOL phacoemulsification with intraocular lens placement; PRK photorefractive keratectomy; LASIK laser assisted in situ keratomileusis; HTN hypertension; DM diabetes mellitus; COPD chronic obstructive pulmonary disease

## 2023-07-06 ENCOUNTER — Ambulatory Visit (HOSPITAL_BASED_OUTPATIENT_CLINIC_OR_DEPARTMENT_OTHER)
Admission: RE | Admit: 2023-07-06 | Discharge: 2023-07-06 | Disposition: A | Discharge status: Home / Self Care | Payer: Medicare HMO | Source: Ambulatory Visit | Attending: Cardiology | Admitting: Cardiology

## 2023-07-06 ENCOUNTER — Encounter (HOSPITAL_BASED_OUTPATIENT_CLINIC_OR_DEPARTMENT_OTHER): Payer: Self-pay | Admitting: *Deleted

## 2023-07-06 DIAGNOSIS — I359 Nonrheumatic aortic valve disorder, unspecified: Secondary | ICD-10-CM | POA: Diagnosis not present

## 2023-07-06 LAB — ECHOCARDIOGRAM COMPLETE
AR max vel: 1.14 cm2
AV Area VTI: 1.12 cm2
AV Area mean vel: 1.15 cm2
AV Mean grad: 11.3 mm[Hg]
AV Peak grad: 19.7 mm[Hg]
Ao pk vel: 2.22 m/s
Area-P 1/2: 2.87 cm2
Calc EF: 67.9 %
S' Lateral: 2.7 cm
Single Plane A2C EF: 73.6 %
Single Plane A4C EF: 62.7 %

## 2023-07-12 ENCOUNTER — Encounter (INDEPENDENT_AMBULATORY_CARE_PROVIDER_SITE_OTHER): Payer: Self-pay | Admitting: Ophthalmology

## 2023-07-12 ENCOUNTER — Ambulatory Visit (INDEPENDENT_AMBULATORY_CARE_PROVIDER_SITE_OTHER): Payer: Medicare HMO | Admitting: Ophthalmology

## 2023-07-12 DIAGNOSIS — H35033 Hypertensive retinopathy, bilateral: Secondary | ICD-10-CM

## 2023-07-12 DIAGNOSIS — Z7984 Long term (current) use of oral hypoglycemic drugs: Secondary | ICD-10-CM | POA: Diagnosis not present

## 2023-07-12 DIAGNOSIS — I10 Essential (primary) hypertension: Secondary | ICD-10-CM

## 2023-07-12 DIAGNOSIS — H25812 Combined forms of age-related cataract, left eye: Secondary | ICD-10-CM | POA: Diagnosis not present

## 2023-07-12 DIAGNOSIS — E113313 Type 2 diabetes mellitus with moderate nonproliferative diabetic retinopathy with macular edema, bilateral: Secondary | ICD-10-CM | POA: Diagnosis not present

## 2023-07-12 DIAGNOSIS — H40003 Preglaucoma, unspecified, bilateral: Secondary | ICD-10-CM

## 2023-07-12 DIAGNOSIS — Z961 Presence of intraocular lens: Secondary | ICD-10-CM | POA: Diagnosis not present

## 2023-07-12 DIAGNOSIS — H43823 Vitreomacular adhesion, bilateral: Secondary | ICD-10-CM | POA: Diagnosis not present

## 2023-07-12 MED ORDER — FARICIMAB-SVOA 6 MG/0.05ML IZ SOSY
6.0000 mg | PREFILLED_SYRINGE | INTRAVITREAL | Status: AC | PRN
  Administered 2023-07-12: 6 mg via INTRAVITREAL

## 2023-07-15 ENCOUNTER — Other Ambulatory Visit: Payer: Self-pay | Admitting: Cardiology

## 2023-08-03 ENCOUNTER — Ambulatory Visit (INDEPENDENT_AMBULATORY_CARE_PROVIDER_SITE_OTHER): Payer: Medicare HMO | Admitting: Family Medicine

## 2023-08-03 ENCOUNTER — Encounter: Payer: Self-pay | Admitting: Family Medicine

## 2023-08-03 VITALS — BP 123/77 | HR 73 | Ht 64.0 in | Wt 161.8 lb

## 2023-08-03 DIAGNOSIS — Z7984 Long term (current) use of oral hypoglycemic drugs: Secondary | ICD-10-CM | POA: Diagnosis not present

## 2023-08-03 DIAGNOSIS — I739 Peripheral vascular disease, unspecified: Secondary | ICD-10-CM | POA: Diagnosis not present

## 2023-08-03 DIAGNOSIS — E1152 Type 2 diabetes mellitus with diabetic peripheral angiopathy with gangrene: Secondary | ICD-10-CM | POA: Diagnosis not present

## 2023-08-03 DIAGNOSIS — I480 Paroxysmal atrial fibrillation: Secondary | ICD-10-CM

## 2023-08-03 DIAGNOSIS — E785 Hyperlipidemia, unspecified: Secondary | ICD-10-CM | POA: Diagnosis not present

## 2023-08-03 DIAGNOSIS — E1169 Type 2 diabetes mellitus with other specified complication: Secondary | ICD-10-CM | POA: Diagnosis not present

## 2023-08-03 DIAGNOSIS — E039 Hypothyroidism, unspecified: Secondary | ICD-10-CM

## 2023-08-03 MED ORDER — ROSUVASTATIN CALCIUM 40 MG PO TABS: ORAL_TABLET | ORAL | 3 refills | Status: AC

## 2023-08-03 MED ORDER — ROSUVASTATIN CALCIUM 40 MG PO TABS: ORAL_TABLET | ORAL | 3 refills | Status: DC

## 2023-08-03 NOTE — Assessment & Plan Note (Signed)
 Blood sugars well controlled on last check in December.  Recommend continued dietary changes.  She will remain on metformin for now.  We can consider GLP-1 if A1c trends up at next visit.

## 2023-08-03 NOTE — Assessment & Plan Note (Addendum)
 She remains on NP thyroid.  Feels well with this.   Lab Results  Component Value Date   TSH 0.718 01/30/2023

## 2023-08-03 NOTE — Assessment & Plan Note (Signed)
 Doing well with rosuvastatin.  Continue current strength.  Updated lipid panel ordered.

## 2023-08-03 NOTE — Progress Notes (Signed)
 Erin Mitchell [facility] - 78 y.o. female MRN [id number]  Date of birth: Dec 06, 1945  Subjective Chief Complaint  Patient presents with   Medical Management of Chronic Issues    T2DM HTN last A1C 6.3    HPI Erin Mitchell is a 78 y.o. female here today for follow up visit.   She reports that she is doing well.    Farxiga discontinued at appt in December due to genital mycotic infection.  A1c in December as 6.3%.  She remains on metformin and is tolerating this well.  Tolerating crestor well for history of HLD and PAD.  She does see cardiology for history of PAF.  She remains on metoprolol.  Additionally she is taking losartan for HTN.   Her BP is well controlled at this time. She has not had anginal symptoms, headache, vision changes.  Feels good with current strength of thryoid medication.   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   VENTRAL HERNIA REPAIR N/A 12/28/2020   Procedure: LAPAROSCOPIC VENTRAL INCISIONAL HERNIA REPAIR WITH MESH;  Surgeon: Darnell Level, MD;  Location: [facility];  Service: General;  Laterality: N/A;    Social History   Socioeconomic History   Marital status: Married    Spouse name: Not on file   Number of children: 7   Years of education: Not on file   Highest education level: Not on file  Occupational History   Occupation: Retired  Tobacco Use   Smoking status:  Former    Current packs/day: 0.00    Average packs/day: 0.3 packs/day for 1 year (0.3 ttl pk-yrs)    Types: Cigarettes    Start date: 06/06/1969    Quit date: 06/06/1970    Years since quitting: 53.1   Smokeless tobacco: Never  Vaping Use   Vaping status: Never Used  Substance and Sexual Activity   Alcohol use: Never   Drug use: Never   Sexual activity: Not Currently    Partners: Male  Other Topics Concern   Not on file  Social History Narrative   Not on file   Social Drivers of Health   Financial Resource Strain: Not on file  Food Insecurity: Not on file  Transportation Needs: Not on file  Physical Activity: Not on file  Stress: Not on file  Social Connections: Not on file    Family History  Problem Relation Age of Onset   Hypertension Father    Diabetes Brother    Emphysema Mother     Health Maintenance  Topic Date Due   Medicare Annual Wellness (AWV)  Never done   OPHTHALMOLOGY EXAM  09/23/2022   COVID-19 Vaccine (3 - 2024-25 season) 02/05/2023   Zoster Vaccines- Shingrix (1 of 2) 08/20/2023 (Originally 04/12/1998)  FOOT EXAM  10/19/2023   HEMOGLOBIN A1C  11/20/2023   Diabetic kidney evaluation - eGFR measurement  01/30/2024   Diabetic kidney evaluation - Urine ACR  01/30/2024   DTaP/Tdap/Td (2 - Td or Tdap) 08/15/2029   Pneumonia Vaccine 47+ Years old  Completed   INFLUENZA VACCINE  Completed   HPV VACCINES  Aged Out   DEXA SCAN  Discontinued   Colonoscopy  Discontinued   Hepatitis C Screening  Discontinued     ----------------------------------------------------------------------------------------------------------------------------------------------------------------------------------------------------------------- Physical Exam BP 123/77 (BP Location: Left Arm, Patient Position: Sitting, Cuff Size: Normal)   Pulse 73   Ht 5\' 4"  (1.626 m)   Wt 161 lb 12 oz (73.4 kg)   SpO2 98%   BMI 27.76 kg/m   Physical Exam Constitutional:      Appearance:  Normal appearance.  HENT:     Head: Normocephalic and atraumatic.  Eyes:     General: No scleral icterus. Cardiovascular:     Rate and Rhythm: Normal rate and regular rhythm.  Pulmonary:     Effort: Pulmonary effort is normal.     Breath sounds: Normal breath sounds.  Neurological:     Mental Status: She is alert.  Psychiatric:        Mood and Affect: Mood normal.     ------------------------------------------------------------------------------------------------------------------------------------------------------------------------------------------------------------------- Assessment and Plan  Type 2 diabetes mellitus with diabetic peripheral angiopathy and gangrene, without long-term current use of insulin (HCC) Blood sugars well controlled on last check in December.  Recommend continued dietary changes.  She will remain on metformin for now.  We can consider GLP-1 if A1c trends up at next visit.   PAF (paroxysmal atrial fibrillation) (HCC) Rate controlled at this time.  Denies new or worsening symptoms.  She will continue to see cardiology for management.  PAD (peripheral artery disease) (HCC) Status post angioplasty.  She does continue to see vascular surgery.  She will remain on Plavix  Hyperlipidemia associated with type 2 diabetes mellitus (HCC) Doing well with rosuvastatin.  Continue current strength.  Updated lipid panel ordered.   Acquired hypothyroidism She remains on NP thyroid.  Feels well with this.   Lab Results  Component Value Date   TSH 0.718 01/30/2023     Meds ordered this encounter  Medications   rosuvastatin (CRESTOR) 40 MG tablet    Sig: TAKE 1 TABLET EVERY DAY    Dispense:  90 tablet    Refill:  3    No follow-ups on file.    This visit occurred during the SARS-CoV-2 public health emergency.  Safety protocols were in place, including screening questions prior to the visit, additional usage of staff PPE, and extensive cleaning of exam room  while observing appropriate contact time as indicated for disinfecting solutions.

## 2023-08-03 NOTE — Assessment & Plan Note (Signed)
Rate controlled at this time.  Denies new or worsening symptoms.  She will continue to see cardiology for management.

## 2023-08-03 NOTE — Assessment & Plan Note (Signed)
 Status post angioplasty.  She does continue to see vascular surgery.  She will remain on Plavix

## 2023-08-10 ENCOUNTER — Encounter: Payer: Self-pay | Admitting: Podiatry

## 2023-08-10 ENCOUNTER — Ambulatory Visit: Payer: Medicare HMO | Admitting: Podiatry

## 2023-08-10 DIAGNOSIS — M79674 Pain in right toe(s): Secondary | ICD-10-CM

## 2023-08-10 DIAGNOSIS — B351 Tinea unguium: Secondary | ICD-10-CM | POA: Diagnosis not present

## 2023-08-10 DIAGNOSIS — E1152 Type 2 diabetes mellitus with diabetic peripheral angiopathy with gangrene: Secondary | ICD-10-CM | POA: Diagnosis not present

## 2023-08-10 DIAGNOSIS — I739 Peripheral vascular disease, unspecified: Secondary | ICD-10-CM | POA: Diagnosis not present

## 2023-08-10 DIAGNOSIS — M79675 Pain in left toe(s): Secondary | ICD-10-CM | POA: Diagnosis not present

## 2023-08-10 NOTE — Progress Notes (Signed)
 [facility] - Clinic Note  08/23/2023     CHIEF COMPLAINT Patient presents for Retina Follow Up  HISTORY OF PRESENT ILLNESS: Erin Mitchell is a 78 y.o. female who presents to the clinic today for:   HPI     Retina Follow Up   Patient presents with  Diabetic Retinopathy.  In both eyes.  Duration of 6 weeks.  Since onset it is stable.  I, the attending physician,  performed the HPI with the patient and updated documentation appropriately.        Comments   Patient states vision the same OU. BS was 117 this am. Last A1c was 6.2, checked 6 months ago.      Last edited by Rennis Chris, MD on 08/23/2023 12:36 PM.    Pt states vision is the same  Referring physician: Everrett Coombe, DO [address] [facility] [address] [place],  [place] [address]  HISTORICAL INFORMATION:   Selected notes from the MEDICAL RECORD NUMBER Referred by Dr. Everrett Coombe for DM exam LEE:  Ocular Hx- PMH- DM, HTN, afib, kidney disease, last a1c was 6.8 on 05.05.22    CURRENT MEDICATIONS: No current outpatient medications on file. (Ophthalmic Drugs)   No current facility-administered medications for this visit. (Ophthalmic Drugs)   Current Outpatient Medications (Other)  Medication Sig   Alcohol Swabs (DROPSAFE ALCOHOL PREP) 70 % PADS USE AS DIRECTED.   Blood Glucose Monitoring Suppl (TRUE METRIX METER) w/Device KIT Use as directed to test blood glucose daily   Cholecalciferol (VITAMIN D-1000 MAX ST PO) Take 2,000 Units by mouth daily.   clopidogrel (PLAVIX) 75 MG tablet TAKE 1 TABLET EVERY DAY   glucose blood (TRUE METRIX BLOOD GLUCOSE TEST) test strip USE AS INSTRUCTED   loperamide (IMODIUM A-D) 2 MG tablet Take 2-4 mg by mouth 4 (four) times daily as needed for diarrhea or loose stools.   losartan (COZAAR) 100 MG tablet TAKE 1 TABLET EVERY DAY   Melatonin 10 MG CAPS Take 10 mg by mouth at bedtime.   metFORMIN (GLUCOPHAGE-XR) 500 MG 24 hr tablet TAKE 2 TABLETS  TWICE DAILY WITH MEALS   metoprolol tartrate (LOPRESSOR) 25 MG tablet Take 1 tablet (25 mg total) by mouth 2 (two) times daily.   NP THYROID 90 MG tablet TAKE 1 TABLET BY MOUTH EVERY DAY *NOT COVERED   Probiotic Product (ALIGN) 4 MG CAPS Take 4 mg by mouth daily.   rosuvastatin (CRESTOR) 40 MG tablet TAKE 1 TABLET EVERY DAY   TRUEplus Lancets 33G MISC Use as directed daily.   Zinc Sulfate (ZINC 15 PO) Take by mouth.   No current facility-administered medications for this visit. (Other)   REVIEW OF SYSTEMS: ROS   Positive for: Endocrine, Cardiovascular, Eyes Negative for: Constitutional, Gastrointestinal, Neurological, Skin, Genitourinary, Musculoskeletal, HENT, Respiratory, Psychiatric, Allergic/Imm, Heme/Lymph Last edited by Annalee Genta D, COT on 08/23/2023  8:23 AM.      ALLERGIES Allergies  Allergen Reactions   [name] [Dapagliflozin] Other (See Comments)    Yeast infections     PAST MEDICAL HISTORY Past Medical History:  Diagnosis Date   Atrial fibrillation (HCC)    on Plavix   Cataract    Diabetes (HCC)    Diabetic retinopathy (HCC)    Exposure to Agent Orange    Hypertension    Hypertensive retinopathy    hypothyroidism    Hypothyroidism    Kidney disease    Past Surgical History:  Procedure Laterality Date  APPENDECTOMY     CESAREAN SECTION     CHOLECYSTECTOMY     HERNIA REPAIR     TOE AMPUTATION Right    2nd & 3rd   VENTRAL HERNIA REPAIR N/A 12/28/2020   Procedure: LAPAROSCOPIC VENTRAL INCISIONAL HERNIA REPAIR WITH MESH;  Surgeon: Darnell Level, MD;  Location: [facility];  Service: General;  Laterality: N/A;   FAMILY HISTORY Family History  Problem Relation Age of Onset   Hypertension Father    Diabetes Brother    Emphysema Mother    SOCIAL HISTORY Social History   Tobacco Use   Smoking status: Former    Current packs/day: 0.00    Average packs/day: 0.3 packs/day for 1 year (0.3 ttl pk-yrs)    Types: Cigarettes    Start date: 06/06/1969    Quit  date: 06/06/1970    Years since quitting: 53.2   Smokeless tobacco: Never  Vaping Use   Vaping status: Never Used  Substance Use Topics   Alcohol use: Never   Drug use: Never       OPHTHALMIC EXAM: Base Eye Exam     Visual Acuity (Snellen - Linear)       Right Left   Dist cc 20/40 +2 20/20   Dist ph cc NI     Correction: Glasses         Tonometry (Tonopen, 8:27 AM)       Right Left   Pressure 14 19         Pupils       Dark Light Shape React APD   Right 2 1 Round Brisk None   Left 2 1 Round Brisk None         Visual Fields (Counting fingers)       Left Right    Full Full         Extraocular Movement       Right Left    Full, Ortho Full, Ortho         Neuro/Psych     Oriented x3: Yes   Mood/Affect: Normal         Dilation     Both eyes: 2.5% Phenylephrine, 1.0% Mydriacyl @ 8:27 AM           Slit Lamp and Fundus Exam     Slit Lamp Exam       Right Left   Lids/Lashes Dermatochalasis - upper lid, Meibomian gland dysfunction Dermatochalasis - upper lid, Meibomian gland dysfunction   Conjunctiva/Sclera White and quiet White and quiet   Cornea arcus, trace tear film debris, well healed cataract wound arcus, trace PEE, mild tear film debris   Anterior Chamber Moderate depth, clear, very narrow temporal angles Moderate depth, clear, very narrow temporal angles   Iris Round and moderately dilated, mild anterior bowing Round and moderately dilated, mild anterior bowing   Lens PC IOL in good position 2-3+ Nuclear sclerosis with brunescence, 2-3+ Cortical cataract   Anterior Vitreous Vitreous syneresis, Posterior vitreous detachment, +vitreous condensations Vitreous syneresis, Posterior vitreous detachment, vitreous condensations         Fundus Exam       Right Left   Disc Trace pallor, Sharp rim, +cupping Pink and Sharp, +cupping   C/D Ratio 0.7 0.7   Macula Blunted foveal reflex, central cystic changes / edema -- slightly increased,  RPE mottling, fine drusen, pigment clumping, cluster of MA temporal to fovea and temporal macula -- stably improved, mild ERM, punctate central exudation Flat, Blunted foveal reflex, RPE  mottling, focal MA temp mac, no exudates, cystic changes nasal macula -- stably improved   Vessels attenuated, Tortuous attenuated, Tortuous   Periphery Attached, mild reticular degeneration, rare MA Attached, mild reticular degeneration, rare MA           Refraction     Wearing Rx       Sphere Cylinder Axis Add   Right -0.50 +0.75 166 +2.75   Left +1.75 Sphere  +2.75    Type: Bifocal           IMAGING AND PROCEDURES  Imaging and Procedures for 08/23/2023  OCT, Retina - OU - Both Eyes       Right Eye Quality was good. Central Foveal Thickness: 323. Progression has worsened. Findings include no SRF, abnormal foveal contour, intraretinal hyper-reflective material, epiretinal membrane, intraretinal fluid, vitreomacular adhesion (Persistent central cyst and surrounding cystic changes -- slightly increased).   Left Eye Quality was good. Central Foveal Thickness: 266. Progression has been stable. Findings include no SRF, abnormal foveal contour, intraretinal fluid, vitreous traction, vitreomacular adhesion (persistent central VMT).   Notes *Images captured and stored on drive  Diagnosis / Impression:  OD: +central DME, Persistent central cyst and surrounding cystic changes -- slightly increased OS: persistent central VMT  Clinical management:  See below  Abbreviations: NFP - Normal foveal profile. CME - cystoid macular edema. PED - pigment epithelial detachment. IRF - intraretinal fluid. SRF - subretinal fluid. EZ - ellipsoid zone. ERM - epiretinal membrane. ORA - outer retinal atrophy. ORT - outer retinal tubulation. SRHM - subretinal hyper-reflective material. IRHM - intraretinal hyper-reflective material      Intravitreal Injection, Pharmacologic Agent - OD - Right Eye       Time  Out 08/23/2023. 9:07 AM. Confirmed correct patient, procedure, site, and patient consented.   Anesthesia Topical anesthesia was used. Anesthetic medications included Lidocaine 2%, Proparacaine 0.5%.   Procedure Preparation included 5% betadine to ocular surface, eyelid speculum. A (32g) needle was used.   Injection: 6 mg faricimab-svoa 6 MG/0.05ML (Patient supplied)   Route: Intravitreal, Site: Right Eye   NDC: 16109-604-54, Lot: [id number], Expiration date: 02/02/2025, Waste: 0 mL   Post-op Post injection exam found visual acuity of at least counting fingers. The patient tolerated the procedure well. There were no complications. The patient received written and verbal post procedure care education. Post injection medications were not given.   Notes **SAMPLE MEDICATION ADMINISTERED**            ASSESSMENT/PLAN:    ICD-10-CM   1. Moderate nonproliferative diabetic retinopathy of both eyes with macular edema associated with type 2 diabetes mellitus (HCC)  E11.3313 OCT, Retina - OU - Both Eyes    Intravitreal Injection, Pharmacologic Agent - OD - Right Eye    faricimab-svoa (VABYSMO) 6mg /0.62mL intravitreal injection    2. Long term (current) use of oral hypoglycemic drugs  Z79.84     3. 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 Essential hypertension  I10     5. Hypertensive retinopathy of both eyes  H35.033     6. Pseudophakia  Z96.1     7. Combined forms of age-related cataract of left eye  H25.812     8. Glaucoma suspect of both eyes  H40.003      1,2. Moderate non-proliferative diabetic retinopathy, both eyes  - A1c: 6.3 on 12.16.24  - s/p IVE OD #1 (06.22.22)--sample, #2 (09.07.22), #3 (11.14.22), #4 (12.28.22), #5 (02.08.23), IVE #6 (03.15.23), #7 (04.19.23), #8 (  05.24.23), #9 (06.28.23), #10 (07.27.23), #11 (08.24.23) -- IVE resistance  - s/p IVV OD #1 (09.21.23), #2 (10.19.23), #3 (11.16.23), #4 (12.14.23), #5 (01.18.24), #6 (02.21.24), #7 (03.27.24), #8  (05.01.24), #9 (06.05.24), #10 (07.09.24), #11 (08.14.24), #12 (09.18.24), #13 (10.23.24), #14 (11.27.24), #15 (sample -- 01.02.25), #16 (sample -- 02.05.25) - previously seen at [facility] by Dr. Trudie Buckler (Fax [phone]) - history of anti-VEGF injections since 2019 -- last injection IVE OD Feb/Mar 2022 -- q62mo interval - FA (11.14.22) shows OD: Focal hyperflourescence IT to fovea; no NV; OS: No NV - exam w/ scattered MA - OCT shows OD: +central DME, Persistent central cyst and surrounding cystic changes -- slightly increased; OS: persistent central VMT at 6 wks - BCVA OD 20/40 from 20/30; OS 20/20 -- stable. - recommend IVV OD #17 (sample) today, 03.19.25 -- w/ f/u back to 5 wks - pt wishes to proceed - RBA of procedure discussed, questions answered - IVV informed consent obtained and signed, 02.05.25 - see procedure note - f/u in 5 wks  -- DFE/OCT, possible injection  3. VMA/VMT OU  - central VMA/VMT OD likely contributing to central cyst  - OS stable  - monitor  4,5. Hypertensive retinopathy OU - discussed importance of tight BP control - monitor  6. Pseudophakia OD  - s/p CE/IOL OD (02.29.24, Dr. Wynelle Link)  - IOL in good position, doing well  - monitor  7. Mixed Cataract OS - The symptoms of cataract, surgical options, and treatments and risks were discussed with patient. - discussed diagnosis and progression - under the expert management of Dr. Wynelle Link  8. Glaucoma suspect  - C/D 0.7 OU  - IOPs okay  - under the expert management of Dr. Wynelle Link  Ophthalmic Meds Ordered this visit:  Meds ordered this encounter  Medications   faricimab-svoa (VABYSMO) 6mg /0.50mL intravitreal injection     Return in about 5 weeks (around 09/27/2023) for f/u NPDR OU, DFE, OCT, Possible Injxn.  There are no Patient Instructions on file for this visit.  This document serves as a record of services personally performed by Karie Chimera, MD, PhD. It was created on their behalf by  Glee Arvin. Manson Passey, OA an ophthalmic technician. The creation of this record is the provider's dictation and/or activities during the visit.    Electronically signed by: Glee Arvin. Manson Passey, OA 08/26/23 1:41 AM  Karie Chimera, M.D., Ph.D. Diseases & Surgery of the Retina and Vitreous [facility]  I have reviewed the above documentation for accuracy and completeness, and I agree with the above. Karie Chimera, M.D., Ph.D. 08/26/23 1:42 AM   Abbreviations: M myopia (nearsighted); A astigmatism; H hyperopia (farsighted); P presbyopia; Mrx spectacle prescription;  CTL contact lenses; OD right eye; OS left eye; OU both eyes  XT exotropia; ET esotropia; PEK punctate epithelial keratitis; PEE punctate epithelial erosions; DES dry eye syndrome; MGD meibomian gland dysfunction; ATs artificial tears; PFAT's preservative free artificial tears; NSC nuclear sclerotic cataract; PSC posterior subcapsular cataract; ERM epi-retinal membrane; PVD posterior vitreous detachment; RD retinal detachment; DM diabetes mellitus; DR diabetic retinopathy; NPDR non-proliferative diabetic retinopathy; PDR proliferative diabetic retinopathy; CSME clinically significant macular edema; DME diabetic macular edema; dbh dot blot hemorrhages; CWS cotton wool spot; POAG primary open angle glaucoma; C/D cup-to-disc ratio; HVF humphrey visual field; GVF goldmann visual field; OCT optical coherence tomography; IOP intraocular pressure; BRVO Branch retinal vein occlusion; CRVO central retinal vein occlusion; CRAO central retinal artery occlusion; BRAO branch retinal artery  occlusion; RT retinal tear; SB scleral buckle; PPV pars plana vitrectomy; VH Vitreous hemorrhage; PRP panretinal laser photocoagulation; IVK intravitreal kenalog; VMT vitreomacular traction; MH Macular hole;  NVD neovascularization of the disc; NVE neovascularization elsewhere; AREDS age related eye disease study; ARMD age related macular degeneration; POAG  primary open angle glaucoma; EBMD epithelial/anterior basement membrane dystrophy; ACIOL anterior chamber intraocular lens; IOL intraocular lens; PCIOL posterior chamber intraocular lens; Phaco/IOL phacoemulsification with intraocular lens placement; PRK photorefractive keratectomy; LASIK laser assisted in situ keratomileusis; HTN hypertension; DM diabetes mellitus; COPD chronic obstructive pulmonary disease

## 2023-08-10 NOTE — Progress Notes (Signed)
  Subjective:  Patient ID: Erin Mitchell, female    DOB: 21-Jul-1945,   MRN: [id number]  Chief Complaint  Patient presents with   Nail Problem    RFC    78 y.o. female presents for concern of thickened elongated and painful nails that are difficult to trim. Requesting to have them trimmed today. Relates burning and tingling in their feet. Patient is diabetic and last A1c was  Lab Results  Component Value Date   HGBA1C 6.3 05/22/2023   .   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 Also has a history of PAD . Denies any other pedal complaints. Denies n/v/f/c.   PCP: Everrett Coombe MD   Past Medical History:  Diagnosis Date   Atrial fibrillation (HCC)    on Plavix   Cataract    Diabetes (HCC)    Diabetic retinopathy (HCC)    Exposure to Agent Orange    Hypertension    Hypertensive retinopathy    hypothyroidism    Hypothyroidism    Kidney disease     Objective:  Physical Exam: Vascular: DP/PT pulses 2/4 bilateral. CFT <3 seconds. Absent hair growth on digits. No edema.  Skin. No lacerations or abrasions bilateral feet. Nails 1-5 are thickened discolored and elongated with subungual debris.  Right great toe hhealed well no open areas.  Musculoskeletal: MMT 5/5 bilateral lower extremities in DF, PF, Inversion and Eversion. Deceased ROM in DF of ankle joint. Right second and third digits amputated. Severe bunion of right foot.  Neurological: Sensation intact to light touch.   Assessment:   1. Dermatophytosis of nail   2. Pain in toes of both feet   3. PAD (peripheral artery disease) (HCC)   4. Type 2 diabetes mellitus with diabetic peripheral angiopathy and gangrene, without long-term current use of insulin (HCC)           Plan:  Patient was evaluated and treated and all questions answered. -Discussed and educated patient on diabetic foot care, especially with  regards to the vascular, neurological  and musculoskeletal systems.  -Stressed the importance of good glycemic control and the detriment of not  controlling glucose levels in relation to the foot. -Discussed supportive shoes at all times and checking feet regularly.  -Mechanically debrided all nails 1-5 bilateral using sterile nail nipper and filed with dremel without incident  -Answered all patient questions -Patient to return  in 3 months for at risk foot care -Patient advised to call the office if any problems or questions arise in the meantime.    Louann Sjogren, DPM

## 2023-08-17 ENCOUNTER — Other Ambulatory Visit: Payer: Self-pay | Admitting: Family Medicine

## 2023-08-17 DIAGNOSIS — E1152 Type 2 diabetes mellitus with diabetic peripheral angiopathy with gangrene: Secondary | ICD-10-CM

## 2023-08-23 ENCOUNTER — Ambulatory Visit (INDEPENDENT_AMBULATORY_CARE_PROVIDER_SITE_OTHER): Payer: Medicare HMO | Admitting: Ophthalmology

## 2023-08-23 ENCOUNTER — Encounter (INDEPENDENT_AMBULATORY_CARE_PROVIDER_SITE_OTHER): Payer: Self-pay | Admitting: Ophthalmology

## 2023-08-23 DIAGNOSIS — Z7984 Long term (current) use of oral hypoglycemic drugs: Secondary | ICD-10-CM | POA: Diagnosis not present

## 2023-08-23 DIAGNOSIS — E113313 Type 2 diabetes mellitus with moderate nonproliferative diabetic retinopathy with macular edema, bilateral: Secondary | ICD-10-CM | POA: Diagnosis not present

## 2023-08-23 DIAGNOSIS — H40003 Preglaucoma, unspecified, bilateral: Secondary | ICD-10-CM

## 2023-08-23 DIAGNOSIS — H43823 Vitreomacular adhesion, bilateral: Secondary | ICD-10-CM | POA: Diagnosis not present

## 2023-08-23 DIAGNOSIS — Z961 Presence of intraocular lens: Secondary | ICD-10-CM | POA: Diagnosis not present

## 2023-08-23 DIAGNOSIS — H25812 Combined forms of age-related cataract, left eye: Secondary | ICD-10-CM

## 2023-08-23 DIAGNOSIS — H35033 Hypertensive retinopathy, bilateral: Secondary | ICD-10-CM

## 2023-08-23 DIAGNOSIS — I10 Essential (primary) hypertension: Secondary | ICD-10-CM | POA: Diagnosis not present

## 2023-08-23 MED ORDER — FARICIMAB-SVOA 6 MG/0.05ML IZ SOLN
6.0000 mg | INTRAVITREAL | Status: AC | PRN
  Administered 2023-08-23: 6 mg via INTRAVITREAL

## 2023-08-28 ENCOUNTER — Other Ambulatory Visit: Payer: Self-pay | Admitting: Family Medicine

## 2023-09-16 NOTE — Progress Notes (Signed)
 HPI: FU atrial fibrillation, diabetes mellitus, hypertension, chronic stage III kidney disease, peripheral vascular disease.  Previously cared for in [place] .  She apparently had a bout of atrial fibrillation in the setting of osteomyelitis in the past.  She states she was not anticoagulated and had outpatient Holter that was unremarkable. Nuclear study 6/22 showed EF 69, apical infarct, no ischemia. Dopplers 6/24 showed patent stent in the  right tibioperoneal trunk. ABI 6/24 showed noncompressible with normal toe brachial index bilaterally. Echo 1/25 showed normal LV function, mild LVH, mild AS (mean gradient 11 mmHg). Since she was last seen, she denies increased dyspnea, chest pain, palpitations or syncope.  Current Outpatient Medications  Medication Sig Dispense Refill   Alcohol Swabs (DROPSAFE ALCOHOL PREP) 70 % PADS USE AS DIRECTED. 300 each 3   Blood Glucose Monitoring Suppl (TRUE METRIX METER) w/Device KIT Use as directed to test blood glucose daily 1 kit 0   Cholecalciferol (VITAMIN D-1000 MAX ST PO) Take 2,000 Units by mouth daily.     clopidogrel  (PLAVIX ) 75 MG tablet TAKE 1 TABLET EVERY DAY 90 tablet 3   glucose blood (TRUE METRIX BLOOD GLUCOSE TEST) test strip USE AS INSTRUCTED 300 strip 3   loperamide  (IMODIUM  A-D) 2 MG tablet Take 2-4 mg by mouth 4 (four) times daily as needed for diarrhea or loose stools.     losartan  (COZAAR ) 100 MG tablet TAKE 1 TABLET EVERY DAY 90 tablet 3   Melatonin 10 MG CAPS Take 10 mg by mouth at bedtime.     metFORMIN  (GLUCOPHAGE -XR) 500 MG 24 hr tablet TAKE 2 TABLETS TWICE DAILY WITH MEALS 360 tablet 3   metoprolol  tartrate (LOPRESSOR ) 25 MG tablet Take 1 tablet (25 mg total) by mouth 2 (two) times daily. 180 tablet 3   NP THYROID  90 MG tablet TAKE 1 TABLET BY MOUTH EVERY DAY *NOT COVERED 90 tablet 1   Probiotic Product (ALIGN) 4 MG CAPS Take 4 mg by mouth daily.     rosuvastatin  (CRESTOR ) 40 MG tablet TAKE 1 TABLET EVERY DAY 90 tablet 3   TRUEplus  Lancets 33G MISC Use as directed daily. 100 each 3   Zinc Sulfate (ZINC 15 PO) Take by mouth.     No current facility-administered medications for this visit.     Past Medical History:  Diagnosis Date   Atrial fibrillation (HCC)    on Plavix    Cataract    Diabetes (HCC)    Diabetic retinopathy (HCC)    Exposure to Agent Orange    Hypertension    Hypertensive retinopathy    hypothyroidism    Hypothyroidism    Kidney disease     Past Surgical History:  Procedure Laterality Date   APPENDECTOMY     CESAREAN SECTION     CHOLECYSTECTOMY     HERNIA REPAIR     TOE AMPUTATION Right    2nd & 3rd   VENTRAL HERNIA REPAIR N/A 12/28/2020   Procedure: LAPAROSCOPIC VENTRAL INCISIONAL HERNIA REPAIR WITH MESH;  Surgeon: Oralee Billow, MD;  Location: [facility];  Service: General;  Laterality: N/A;    Social History   Socioeconomic History   Marital status: Married    Spouse name: Not on file   Number of children: 7   Years of education: Not on file   Highest education level: Not on file  Occupational History   Occupation: Retired  Tobacco Use   Smoking status: Former    Current packs/day: 0.00    Average  packs/day: 0.3 packs/day for 1 year (0.3 ttl pk-yrs)    Types: Cigarettes    Start date: 06/06/1969    Quit date: 06/06/1970    Years since quitting: 53.3   Smokeless tobacco: Never  Vaping Use   Vaping status: Never Used  Substance and Sexual Activity   Alcohol use: Never   Drug use: Never   Sexual activity: Not Currently    Partners: Male  Other Topics Concern   Not on file  Social History Narrative   Not on file   Social Drivers of Health   Financial Resource Strain: Not on file  Food Insecurity: Not on file  Transportation Needs: Not on file  Physical Activity: Not on file  Stress: Not on file  Social Connections: Not on file  Intimate Partner Violence: Not on file    Family History  Problem Relation Age of Onset   Hypertension Father    Diabetes Brother     Emphysema Mother     ROS: no fevers or chills, productive cough, hemoptysis, dysphasia, odynophagia, melena, hematochezia, dysuria, hematuria, rash, seizure activity, orthopnea, PND, pedal edema, claudication. Remaining systems are negative.  Physical Exam: Well-developed well-nourished in no acute distress.  Skin is warm and dry.  HEENT is normal.  Neck is supple.  Chest is clear to auscultation with normal expansion.  Cardiovascular exam is regular rate and rhythm.  2/6 systolic murmur apex. Abdominal exam nontender or distended. No masses palpated. Extremities show no edema. neuro grossly intact  ECG-normal sinus rhythm with first-degree AV block, nonspecific ST changes.  Personally reviewed  A/P  1 atrial fibrillation-patient had an isolated episode in the past in the setting of osteomyelitis by report.  She has had no recurrences and is therefore not anticoagulated.  2 coronary artery disease-based on previous nuclear study showing infarct but no ischemia.  Continue Plavix  and statin. No chest pain.  3 hypertension-blood pressure controlled; continue present meds.  4 hyperlipidemia-continue statin.  5 peripheral vascular disease-continue Plavix  and statin.  She denies claudication.  6 AS-mild on most recent echo; plan fu study 1/26.  Alexandria Angel, MD

## 2023-09-25 ENCOUNTER — Ambulatory Visit (INDEPENDENT_AMBULATORY_CARE_PROVIDER_SITE_OTHER): Payer: Medicare HMO | Admitting: Cardiology

## 2023-09-25 ENCOUNTER — Encounter: Payer: Self-pay | Admitting: Cardiology

## 2023-09-25 VITALS — BP 103/66 | HR 88 | Ht 64.0 in | Wt 160.0 lb

## 2023-09-25 DIAGNOSIS — I10 Essential (primary) hypertension: Secondary | ICD-10-CM

## 2023-09-25 DIAGNOSIS — I739 Peripheral vascular disease, unspecified: Secondary | ICD-10-CM

## 2023-09-25 DIAGNOSIS — I359 Nonrheumatic aortic valve disorder, unspecified: Secondary | ICD-10-CM

## 2023-09-25 DIAGNOSIS — I480 Paroxysmal atrial fibrillation: Secondary | ICD-10-CM | POA: Diagnosis not present

## 2023-09-25 DIAGNOSIS — E785 Hyperlipidemia, unspecified: Secondary | ICD-10-CM | POA: Diagnosis not present

## 2023-09-25 DIAGNOSIS — I2510 Atherosclerotic heart disease of native coronary artery without angina pectoris: Secondary | ICD-10-CM | POA: Diagnosis not present

## 2023-09-25 DIAGNOSIS — E1169 Type 2 diabetes mellitus with other specified complication: Secondary | ICD-10-CM | POA: Diagnosis not present

## 2023-09-25 NOTE — Patient Instructions (Signed)

## 2023-09-26 NOTE — Progress Notes (Signed)
 [facility] - Clinic Note  09/27/2023     CHIEF COMPLAINT Patient presents for Retina Follow Up  HISTORY OF PRESENT ILLNESS: Erin Mitchell is a 78 y.o. female who presents to the clinic today for:   HPI     Retina Follow Up   Patient presents with  Diabetic Retinopathy.  In both eyes.  Severity is moderate.  Duration of 5 weeks.  Since onset it is stable.  I, the attending physician,  performed the HPI with the patient and updated documentation appropriately.        Comments   Pt here for 5 wk ret f/u NPDR OU. Pt states VA is stable, distortion in OD is almost gone completely. Pt states she's noticed its gradually improved over time. Pt did get a report from cardiologist she has a broken heart valve, no surgery needed yet.       Last edited by Ronelle Coffee, MD on 09/27/2023 11:58 AM.    Pt states vision is stable  Referring physician: Adela Holter, DO [address] [facility] [address]  [address] [place],  [place] [address]  HISTORICAL INFORMATION:   Selected notes from the MEDICAL RECORD NUMBER Referred by Dr. Adela Holter for DM exam LEE:  Ocular Hx- PMH- DM, HTN, afib, kidney disease, last a1c was 6.8 on 05.05.22    CURRENT MEDICATIONS: No current outpatient medications on file. (Ophthalmic Drugs)   No current facility-administered medications for this visit. (Ophthalmic Drugs)   Current Outpatient Medications (Other)  Medication Sig   Alcohol Swabs (DROPSAFE ALCOHOL PREP) 70 % PADS USE AS DIRECTED.   Blood Glucose Monitoring Suppl (TRUE METRIX METER) w/Device KIT Use as directed to test blood glucose daily   Cholecalciferol (VITAMIN D-1000 MAX ST PO) Take 2,000 Units by mouth daily.   clopidogrel  (PLAVIX ) 75 MG tablet TAKE 1 TABLET EVERY DAY   glucose blood (TRUE METRIX BLOOD GLUCOSE TEST) test strip USE AS INSTRUCTED   loperamide  (IMODIUM  A-D) 2 MG tablet Take 2-4 mg by mouth 4 (four) times daily as needed for diarrhea or loose stools.    losartan  (COZAAR ) 100 MG tablet TAKE 1 TABLET EVERY DAY   Melatonin 10 MG CAPS Take 10 mg by mouth at bedtime.   metFORMIN  (GLUCOPHAGE -XR) 500 MG 24 hr tablet TAKE 2 TABLETS TWICE DAILY WITH MEALS   metoprolol  tartrate (LOPRESSOR ) 25 MG tablet Take 1 tablet (25 mg total) by mouth 2 (two) times daily.   NP THYROID  90 MG tablet TAKE 1 TABLET BY MOUTH EVERY DAY *NOT COVERED   Probiotic Product (ALIGN) 4 MG CAPS Take 4 mg by mouth daily.   rosuvastatin  (CRESTOR ) 40 MG tablet TAKE 1 TABLET EVERY DAY   TRUEplus Lancets 33G MISC Use as directed daily.   Zinc Sulfate (ZINC 15 PO) Take by mouth.   No current facility-administered medications for this visit. (Other)   REVIEW OF SYSTEMS: ROS   Positive for: Endocrine, Cardiovascular, Eyes Negative for: Constitutional, Gastrointestinal, Neurological, Skin, Genitourinary, Musculoskeletal, HENT, Respiratory, Psychiatric, Allergic/Imm, Heme/Lymph Last edited by Anthony Bateman, COT on 09/27/2023  8:57 AM.       ALLERGIES Allergies  Allergen Reactions   Farxiga  [Dapagliflozin ] Other (See Comments)    Yeast infections     PAST MEDICAL HISTORY Past Medical History:  Diagnosis Date   Atrial fibrillation (HCC)    on Plavix    Cataract    Diabetes (HCC)    Diabetic retinopathy (HCC)    Exposure to Agent 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   VENTRAL HERNIA REPAIR N/A 12/28/2020   Procedure: LAPAROSCOPIC VENTRAL INCISIONAL HERNIA REPAIR WITH MESH;  Surgeon: Oralee Billow, MD;  Location: [facility];  Service: General;  Laterality: N/A;   FAMILY HISTORY Family History  Problem Relation Age of Onset   Hypertension Father    Diabetes Brother    Emphysema Mother    SOCIAL HISTORY Social History   Tobacco  Use   Smoking status: Former    Current packs/day: 0.00    Average packs/day: 0.3 packs/day for 1 year (0.3 ttl pk-yrs)    Types: Cigarettes    Start date: 06/06/1969    Quit date: 06/06/1970    Years since quitting: 53.3   Smokeless tobacco: Never  Vaping Use   Vaping status: Never Used  Substance Use Topics   Alcohol use: Never   Drug use: Never       OPHTHALMIC EXAM: Base Eye Exam     Visual Acuity (Snellen - Linear)       Right Left   Dist cc 20/40 +2 20/20   Dist ph cc NI     Correction: Glasses         Tonometry (Tonopen, 9:03 AM)       Right Left   Pressure 15 14         Pupils       Pupils   Right PERRL   Left PERRL         Visual Fields (Counting fingers)       Left Right    Full Full         Extraocular Movement       Right Left    Full, Ortho Full, Ortho         Neuro/Psych     Oriented x3: Yes   Mood/Affect: Normal         Dilation     Both eyes: 1.0% Mydriacyl, 2.5% Phenylephrine  @ 9:04 AM           Slit Lamp and Fundus Exam     Slit Lamp Exam       Right Left   Lids/Lashes Dermatochalasis - upper lid, Meibomian gland dysfunction Dermatochalasis - upper lid, Meibomian gland dysfunction   Conjunctiva/Sclera White and quiet White and quiet   Cornea arcus, trace tear film debris, well healed cataract wound arcus, trace PEE, mild tear film debris   Anterior Chamber Moderate depth, clear, very narrow temporal angles Moderate depth, clear, very narrow temporal angles   Iris Round and moderately dilated, mild anterior bowing Round and moderately dilated, mild anterior bowing   Lens PC IOL in good position 2-3+ Nuclear sclerosis with brunescence, 2-3+ Cortical cataract   Anterior Vitreous Vitreous syneresis, Posterior vitreous detachment, +vitreous condensations Vitreous syneresis, Posterior vitreous detachment, vitreous condensations         Fundus Exam       Right Left   Disc Trace pallor, Sharp rim, +cupping Pink  and Sharp, +cupping   C/D Ratio 0.7 0.7   Macula Blunted foveal reflex, central cystic changes / edema -- slightly improved, RPE mottling, fine drusen, pigment clumping, cluster of MA  temporal to fovea and temporal macula -- stably improved -- no heme, mild ERM, punctate central exudation Flat, Blunted foveal reflex, RPE mottling, focal MA temp mac, no exudates, cystic changes nasal macula -- stably improved   Vessels attenuated, Tortuous attenuated, Tortuous   Periphery Attached, mild reticular degeneration, rare MA Attached, mild reticular degeneration, rare MA           Refraction     Wearing Rx       Sphere Cylinder Axis Add   Right -0.50 +0.75 166 +2.75   Left +1.75 Sphere  +2.75    Type: Bifocal           IMAGING AND PROCEDURES  Imaging and Procedures for 09/27/2023  OCT, Retina - OU - Both Eyes       Right Eye Quality was good. Central Foveal Thickness: 310. Progression has been stable. Findings include no SRF, abnormal foveal contour, intraretinal hyper-reflective material, epiretinal membrane, intraretinal fluid, vitreomacular adhesion (Persistent central cyst and surrounding cystic changes -- slightly improved).   Left Eye Quality was good. Central Foveal Thickness: 265. Progression has been stable. Findings include no SRF, abnormal foveal contour, intraretinal fluid, vitreous traction, vitreomacular adhesion (persistent central VMT with trace cystic changes).   Notes *Images captured and stored on drive  Diagnosis / Impression:  OD: +central DME, Persistent central cyst and surrounding cystic changes -- slightly improved OS: persistent central VMT with trace cystic changes  Clinical management:  See below  Abbreviations: NFP - Normal foveal profile. CME - cystoid macular edema. PED - pigment epithelial detachment. IRF - intraretinal fluid. SRF - subretinal fluid. EZ - ellipsoid zone. ERM - epiretinal membrane. ORA - outer retinal atrophy. ORT - outer retinal  tubulation. SRHM - subretinal hyper-reflective material. IRHM - intraretinal hyper-reflective material      Intravitreal Injection, Pharmacologic Agent - OD - Right Eye       Time Out 09/27/2023. 10:16 AM. Confirmed correct patient, procedure, site, and patient consented.   Anesthesia Topical anesthesia was used. Anesthetic medications included Lidocaine  2%, Proparacaine 0.5%.   Procedure Preparation included 5% betadine to ocular surface, eyelid speculum. A (32g) needle was used.   Injection: 6 mg faricimab -svoa 6 MG/0.05ML (Patient supplied)   Route: Intravitreal, Site: Right Eye   NDC: 40981-191-47, Lot: [id number], Expiration date: 02/02/2025, Waste: 0 mL   Post-op Post injection exam found visual acuity of at least counting fingers. The patient tolerated the procedure well. There were no complications. The patient received written and verbal post procedure care education. Post injection medications were not given.   Notes **SAMPLE MEDICATION ADMINISTERED**            ASSESSMENT/PLAN:    ICD-10-CM   1. Moderate nonproliferative diabetic retinopathy of both eyes with macular edema associated with type 2 diabetes mellitus (HCC)  E11.3313 OCT, Retina - OU - Both Eyes    Intravitreal Injection, Pharmacologic Agent - OD - Right Eye    faricimab -svoa (VABYSMO ) 6mg /0.31mL intravitreal injection    2. Long term (current) use of oral hypoglycemic drugs  Z79.84     3. 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 Essential hypertension  I10     5. Hypertensive retinopathy of both eyes  H35.033     6. Pseudophakia  Z96.1     7. Combined forms of age-related cataract of left eye  H25.812     8. Glaucoma suspect of both eyes  H40.003      1,2. Moderate non-proliferative  diabetic retinopathy, both eyes  - A1c: 6.3 on 12.16.24  - s/p IVE OD #1 (06.22.22)--sample, #2 (09.07.22), #3 (11.14.22), #4 (12.28.22), #5 (02.08.23), IVE #6 (03.15.23), #7 (04.19.23), #8  (05.24.23), #9 (06.28.23), #10 (07.27.23), #11 (08.24.23) -- IVE resistance  - s/p IVV OD #1 (09.21.23), #2 (10.19.23), #3 (11.16.23), #4 (12.14.23), #5 (01.18.24), #6 (02.21.24), #7 (03.27.24), #8 (05.01.24), #9 (06.05.24), #10 (07.09.24), #11 (08.14.24), #12 (09.18.24), #13 (10.23.24), #14 (11.27.24), #15 (sample -- 01.02.25), #16 (sample -- 02.05.25), #17 (sample -- 03.19.25) - previously seen at [facility] by Dr. Scarlette Currier (Fax [phone]) - history of anti-VEGF injections since 2019 -- last injection IVE OD Feb/Mar 2022 -- q55mo interval - FA (11.14.22) shows OD: Focal hyperflourescence IT to fovea; no NV; OS: No NV - exam w/ scattered MA - OCT shows OD: +central DME, Persistent central cyst and surrounding cystic changes -- slightly improved; OS: persistent central VMT at 5 wks - BCVA OD 20/40 - stable; OS 20/20 -- stable. - recommend IVV OD #18 (sample) today, 04.23.25 -- w/ f/u in 5 wks - pt wishes to proceed - RBA of procedure discussed, questions answered - IVV informed consent obtained and signed, 02.05.25 - see procedure note - f/u in 5 wks  -- DFE/OCT, possible injection  3. VMA/VMT OU  - central VMA/VMT OD likely contributing to central cyst  - OS stable  - monitor  4,5. Hypertensive retinopathy OU - discussed importance of tight BP control - monitor  6. Pseudophakia OD  - s/p CE/IOL OD (02.29.24, Dr. Paulene Boron)  - IOL in good position, doing well  - monitor  7. Mixed Cataract OS - The symptoms of cataract, surgical options, and treatments and risks were discussed with patient. - discussed diagnosis and progression - under the expert management of Dr. Paulene Boron  8. Glaucoma suspect  - C/D 0.7 OU  - IOPs okay (15,14)  - under the expert management of Dr. Paulene Boron  Ophthalmic Meds Ordered this visit:  Meds ordered this encounter  Medications   faricimab -svoa (VABYSMO ) 6mg /0.106mL intravitreal injection     Return in about 5 weeks (around 11/01/2023) for f/u NPDR  OU, DFE, OCT, Possible Injxn.  There are no Patient Instructions on file for this visit.  This document serves as a record of services personally performed by Jeanice Millard, MD, PhD. It was created on their behalf by Morley Arabia. Bevin Bucks, OA an ophthalmic technician. The creation of this record is the provider's dictation and/or activities during the visit.    Electronically signed by: Morley Arabia. Bevin Bucks, OA 09/27/23 11:59 AM   Jeanice Millard, M.D., Ph.D. Diseases & Surgery of the Retina and Vitreous [facility]  I have reviewed the above documentation for accuracy and completeness, and I agree with the above. Jeanice Millard, M.D., Ph.D. 09/27/23 12:00 PM   Abbreviations: M myopia (nearsighted); A astigmatism; H hyperopia (farsighted); P presbyopia; Mrx spectacle prescription;  CTL contact lenses; OD right eye; OS left eye; OU both eyes  XT exotropia; ET esotropia; PEK punctate epithelial keratitis; PEE punctate epithelial erosions; DES dry eye syndrome; MGD meibomian gland dysfunction; ATs artificial tears; PFAT's preservative free artificial tears; NSC nuclear sclerotic cataract; PSC posterior subcapsular cataract; ERM epi-retinal membrane; PVD posterior vitreous detachment; RD retinal detachment; DM diabetes mellitus; DR diabetic retinopathy; NPDR non-proliferative diabetic retinopathy; PDR proliferative diabetic retinopathy; CSME clinically significant macular edema; DME diabetic macular edema; dbh dot blot hemorrhages; CWS cotton wool spot; POAG primary open angle glaucoma; C/D  cup-to-disc ratio; HVF humphrey visual field; GVF goldmann visual field; OCT optical coherence tomography; IOP intraocular pressure; BRVO Branch retinal vein occlusion; CRVO central retinal vein occlusion; CRAO central retinal artery occlusion; BRAO branch retinal artery occlusion; RT retinal tear; SB scleral buckle; PPV pars plana vitrectomy; VH Vitreous hemorrhage; PRP panretinal laser  photocoagulation; IVK intravitreal kenalog; VMT vitreomacular traction; MH Macular hole;  NVD neovascularization of the disc; NVE neovascularization elsewhere; AREDS age related eye disease study; ARMD age related macular degeneration; POAG primary open angle glaucoma; EBMD epithelial/anterior basement membrane dystrophy; ACIOL anterior chamber intraocular lens; IOL intraocular lens; PCIOL posterior chamber intraocular lens; Phaco/IOL phacoemulsification with intraocular lens placement; PRK photorefractive keratectomy; LASIK laser assisted in situ keratomileusis; HTN hypertension; DM diabetes mellitus; COPD chronic obstructive pulmonary disease

## 2023-09-27 ENCOUNTER — Encounter (INDEPENDENT_AMBULATORY_CARE_PROVIDER_SITE_OTHER): Payer: Self-pay | Admitting: Ophthalmology

## 2023-09-27 ENCOUNTER — Ambulatory Visit (INDEPENDENT_AMBULATORY_CARE_PROVIDER_SITE_OTHER): Admitting: Ophthalmology

## 2023-09-27 DIAGNOSIS — H35033 Hypertensive retinopathy, bilateral: Secondary | ICD-10-CM | POA: Diagnosis not present

## 2023-09-27 DIAGNOSIS — Z7984 Long term (current) use of oral hypoglycemic drugs: Secondary | ICD-10-CM | POA: Diagnosis not present

## 2023-09-27 DIAGNOSIS — E113313 Type 2 diabetes mellitus with moderate nonproliferative diabetic retinopathy with macular edema, bilateral: Secondary | ICD-10-CM | POA: Diagnosis not present

## 2023-09-27 DIAGNOSIS — H25812 Combined forms of age-related cataract, left eye: Secondary | ICD-10-CM | POA: Diagnosis not present

## 2023-09-27 DIAGNOSIS — Z961 Presence of intraocular lens: Secondary | ICD-10-CM | POA: Diagnosis not present

## 2023-09-27 DIAGNOSIS — H43823 Vitreomacular adhesion, bilateral: Secondary | ICD-10-CM

## 2023-09-27 DIAGNOSIS — I10 Essential (primary) hypertension: Secondary | ICD-10-CM

## 2023-09-27 DIAGNOSIS — H40003 Preglaucoma, unspecified, bilateral: Secondary | ICD-10-CM

## 2023-09-27 MED ORDER — FARICIMAB-SVOA 6 MG/0.05ML IZ SOLN
6.0000 mg | INTRAVITREAL | Status: AC | PRN
  Administered 2023-09-27: 6 mg via INTRAVITREAL

## 2023-10-02 ENCOUNTER — Other Ambulatory Visit

## 2023-10-20 NOTE — Progress Notes (Signed)
 [facility] - Clinic Note  11/01/2023     CHIEF COMPLAINT Patient presents for Retina Follow Up  HISTORY OF PRESENT ILLNESS: Erin Mitchell is a 78 y.o. female who presents to the clinic today for:   HPI     Retina Follow Up   Patient presents with  Diabetic Retinopathy.  In both eyes.  This started 3 years ago.  Severity is moderate.  Duration of 5 weeks.  Since onset it is stable.  I, the attending physician,  performed the HPI with the patient and updated documentation appropriately.        Comments   Pt presents for 5 wk ret f/u, NPDR OU. Pt states she notices both eyes are a little more blurry but it is equal in the eyes so it seems it is just a Mrx update. Pt states she is overdue by about 3 months for an eye exam and needs a recommendation for an optometrist.  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 Pt is using refresh about once a week when her eyes are feeling grainy or itching. Pt denies FOL, floaters, pain. A1c = 6.2, Jan. 2025 BS = 131 this morning. Higher than she likes but coming down.      Last edited by Ronelle Coffee, MD on 11/01/2023 12:26 PM.    Pt feels like she needs new glasses bc it's harder for her to read  Referring physician: Adela Holter, DO [address] [facility] [address]  [address] [place],  [place] [address]  HISTORICAL INFORMATION:   Selected notes from the MEDICAL RECORD NUMBER Referred by Dr. Adela Holter for DM exam LEE:  Ocular Hx- PMH- DM, HTN, afib, kidney disease, last a1c was 6.8 on 05.05.22    CURRENT MEDICATIONS: No current outpatient medications on file. (Ophthalmic Drugs)   No current facility-administered medications for this visit. (Ophthalmic Drugs)   Current Outpatient Medications (Other)  Medication Sig   Alcohol Swabs (DROPSAFE ALCOHOL PREP) 70 % PADS USE AS DIRECTED.   Blood Glucose Monitoring Suppl (TRUE METRIX METER) w/Device KIT Use as directed to test blood glucose  daily   Cholecalciferol (VITAMIN D-1000 MAX ST PO) Take 2,000 Units by mouth daily.   clopidogrel  (PLAVIX ) 75 MG tablet TAKE 1 TABLET EVERY DAY   glucose blood (TRUE METRIX BLOOD GLUCOSE TEST) test strip USE AS INSTRUCTED   loperamide  (IMODIUM  A-D) 2 MG tablet Take 2-4 mg by mouth 4 (four) times daily as needed for diarrhea or loose stools.   losartan  (COZAAR ) 100 MG tablet TAKE 1 TABLET EVERY DAY   Melatonin 10 MG CAPS Take 10 mg by mouth at bedtime.   metFORMIN  (GLUCOPHAGE -XR) 500 MG 24 hr tablet TAKE 2 TABLETS TWICE DAILY WITH MEALS   metoprolol  tartrate (LOPRESSOR ) 25 MG tablet Take 1 tablet (25 mg total) by mouth 2 (two) times daily.   NP THYROID  90 MG tablet TAKE 1 TABLET BY MOUTH EVERY DAY *NOT COVERED   Probiotic Product (ALIGN) 4 MG CAPS Take 4 mg by mouth daily.   rosuvastatin  (CRESTOR ) 40 MG tablet TAKE 1 TABLET EVERY DAY   TRUEplus Lancets 33G MISC Use as directed daily.   Zinc Sulfate (ZINC 15 PO) Take by mouth.   No current facility-administered medications for this visit. (Other)   REVIEW OF SYSTEMS: ROS   Positive for: Endocrine, Cardiovascular, Eyes Negative for: Constitutional, Gastrointestinal, Neurological, Skin, Genitourinary, Musculoskeletal, HENT, Respiratory, Psychiatric, Allergic/Imm, Heme/Lymph Last edited by Carrington Clack,  COT on 11/01/2023  8:08 AM.     ALLERGIES Allergies  Allergen Reactions   Farxiga  [Dapagliflozin ] Other (See Comments)    Yeast infections    PAST MEDICAL HISTORY Past Medical History:  Diagnosis Date   Atrial fibrillation (HCC)    on Plavix    Cataract    Diabetes (HCC)    Diabetic retinopathy (HCC)    Exposure to Agent Orange    Hypertension    Hypertensive retinopathy    hypothyroidism    Hypothyroidism    Kidney disease    Past Surgical History:  Procedure Laterality Date   APPENDECTOMY     CESAREAN SECTION     CHOLECYSTECTOMY     HERNIA REPAIR     TOE AMPUTATION Right    2nd & 3rd   VENTRAL HERNIA REPAIR N/A  12/28/2020   Procedure: LAPAROSCOPIC VENTRAL INCISIONAL HERNIA REPAIR WITH MESH;  Surgeon: Oralee Billow, MD;  Location: [facility];  Service: General;  Laterality: N/A;   FAMILY HISTORY Family History  Problem Relation Age of Onset   Hypertension Father    Diabetes Brother    Emphysema Mother    SOCIAL HISTORY Social History   Tobacco Use   Smoking status: Former    Current packs/day: 0.00    Average packs/day: 0.3 packs/day for 1 year (0.3 ttl pk-yrs)    Types: Cigarettes    Start date: 06/06/1969    Quit date: 06/06/1970    Years since quitting: 53.4   Smokeless tobacco: Never  Vaping Use   Vaping status: Never Used  Substance Use Topics   Alcohol use: Never   Drug use: Never       OPHTHALMIC EXAM: Base Eye Exam     Visual Acuity (Snellen - Linear)       Right Left   Dist cc 20/30 -2 20/20   Dist ph cc NI     Correction: Glasses         Tonometry (Tonopen, 8:20 AM)       Right Left   Pressure 11 10         Pupils       Pupils Dark Light Shape React APD   Right PERRL 2 1 Round Minimal None   Left PERRL 2 1 Round Minimal None         Visual Fields       Left Right    Full Full         Extraocular Movement       Right Left    Full, Ortho Full, Ortho         Neuro/Psych     Oriented x3: Yes   Mood/Affect: Normal         Dilation     Both eyes: 1.0% Mydriacyl, 2.5% Phenylephrine  @ 8:21 AM           Slit Lamp and Fundus Exam     Slit Lamp Exam       Right Left   Lids/Lashes Dermatochalasis - upper lid, Meibomian gland dysfunction Dermatochalasis - upper lid, Meibomian gland dysfunction   Conjunctiva/Sclera White and quiet White and quiet   Cornea arcus, trace tear film debris, well healed cataract wound arcus, trace PEE, mild tear film debris   Anterior Chamber Moderate depth, clear, very narrow temporal angles Moderate depth, clear, very narrow temporal angles   Iris Round and moderately dilated, mild anterior bowing Round and  moderately dilated, mild anterior bowing   Lens PC IOL in good  position 2-3+ Nuclear sclerosis with brunescence, 2-3+ Cortical cataract   Anterior Vitreous Vitreous syneresis, Posterior vitreous detachment, +vitreous condensations Vitreous syneresis, Posterior vitreous detachment, vitreous condensations         Fundus Exam       Right Left   Disc Trace pallor, Sharp rim, +cupping Pink and Sharp, +cupping   C/D Ratio 0.7 0.7   Macula Blunted foveal reflex, central cystic changes / edema -- slightly improved, RPE mottling, fine drusen, pigment clumping, cluster of MA temporal to fovea and temporal macula -- stably improved -- no heme, mild ERM, punctate central exudation Flat, Blunted foveal reflex, RPE mottling, focal MA temp mac -- fading, no exudates, cystic changes nasal macula   Vessels attenuated, Tortuous attenuated, Tortuous   Periphery Attached, mild reticular degeneration, rare MA Attached, mild reticular degeneration, rare MA           Refraction     Wearing Rx       Sphere Cylinder Axis Add   Right -0.50 +0.75 166 +2.75   Left +1.75 Sphere  +2.75    Age: 18.5   Type: Bifocal           IMAGING AND PROCEDURES  Imaging and Procedures for 11/01/2023  OCT, Retina - OU - Both Eyes        Right Eye Quality was good. Central Foveal Thickness: 305. Progression has improved. Findings include no SRF, abnormal foveal contour, intraretinal hyper-reflective material, epiretinal membrane, intraretinal fluid, vitreomacular adhesion (Persistent central cyst and surrounding cystic changes -- slightly improved).   Left Eye Quality was good. Central Foveal Thickness: 266. Progression has been stable. Findings include no SRF, abnormal foveal contour, intraretinal fluid, vitreous traction, vitreomacular adhesion (persistent central VMT with trace cystic changes, trace cystic changes nasal mac).   Notes  *Images captured and stored on drive  Diagnosis / Impression:  OD:  +central DME, Persistent central cyst and surrounding cystic changes -- slightly improved OS: persistent central VMT with trace cystic changes, trace cystic changes nasal mac  Clinical management:  See below  Abbreviations: NFP - Normal foveal profile. CME - cystoid macular edema. PED - pigment epithelial detachment. IRF - intraretinal fluid. SRF - subretinal fluid. EZ - ellipsoid zone. ERM - epiretinal membrane. ORA - outer retinal atrophy. ORT - outer retinal tubulation. SRHM - subretinal hyper-reflective material. IRHM - intraretinal hyper-reflective material      Intravitreal Injection, Pharmacologic Agent - OD - Right Eye        Time Out 11/01/2023. 8:44 AM. Confirmed correct patient, procedure, site, and patient consented.   Anesthesia Topical anesthesia was used. Anesthetic medications included Lidocaine  2%, Proparacaine 0.5%.   Procedure Preparation included 5% betadine to ocular surface, eyelid speculum. A (32g) needle was used.   Injection: 6 mg faricimab -svoa 6 MG/0.05ML (Patient supplied)   Route: Intravitreal, Site: Right Eye   NDC: 09811-914-78, Lot: [id number], Expiration date: 03/05/2025, Waste: 0 mL   Post-op Post injection exam found visual acuity of at least counting fingers. The patient tolerated the procedure well. There were no complications. The patient received written and verbal post procedure care education. Post injection medications were not given.   Notes  **SAMPLE MEDICATION ADMINISTERED**            ASSESSMENT/PLAN:    ICD-10-CM   1. Moderate nonproliferative diabetic retinopathy of both eyes with macular edema associated with type 2 diabetes mellitus (HCC)  E11.3313 OCT, Retina - OU - Both Eyes    Intravitreal Injection, Pharmacologic Agent -  OD - Right Eye    faricimab -svoa (VABYSMO ) 6mg /0.66mL intravitreal injection    2. Long term (current) use of oral hypoglycemic drugs  Z79.84     3. Vitreomacular adhesion of both eyes  H43.823      4. Essential hypertension  I10     5. Hypertensive retinopathy of both eyes  H35.033     6. Pseudophakia  Z96.1     7. Combined forms of age-related cataract of left eye  H25.812     8. Glaucoma suspect of both eyes  H40.003      1,2. Moderate non-proliferative diabetic retinopathy, both eyes  - A1c: 6.3 on 12.16.24  - s/p IVE OD #1 (06.22.22)--sample, #2 (09.07.22), #3 (11.14.22), #4 (12.28.22), #5 (02.08.23), IVE #6 (03.15.23), #7 (04.19.23), #8 (05.24.23), #9 (06.28.23), #10 (07.27.23), #11 (08.24.23) -- IVE resistance  - s/p IVV OD #1 (09.21.23), #2 (10.19.23), #3 (11.16.23), #4 (12.14.23), #5 (01.18.24), #6 (02.21.24), #7 (03.27.24), #8 (05.01.24), #9 (06.05.24), #10 (07.09.24), #11 (08.14.24), #12 (09.18.24), #13 (10.23.24), #14 (11.27.24), #15 (sample -- 01.02.25), #16 (sample -- 02.05.25), #17 (sample -- 03.19.25), #18 (sample -- 04.23.25),  - previously seen at [facility] by Dr. Scarlette Currier (Fax [phone]) - history of anti-VEGF injections since 2019 -- last injection IVE OD Feb/Mar 2022 -- q74mo interval - FA (11.14.22) shows OD: Focal hyperflourescence IT to fovea; no NV; OS: No NV - exam w/ scattered MA - OCT shows OD: +central DME, Persistent central cyst and surrounding cystic changes -- slightly improved; OS: persistent central VMT at 5 wks - BCVA OD improved to 20/30 from 20/40; OS 20/20 -- stable. - recommend IVV OD #19 (sample) today, 05.28.25 -- w/ f/u in 5 wks - pt wishes to proceed - RBA of procedure discussed, questions answered - IVV informed consent obtained and signed, 02.05.25 - see procedure note - Vabysmo  PAP pending - f/u in 5 wks  -- DFE/OCT, possible injection  3. VMA/VMT OU  - central VMA/VMT OD likely contributing to central cyst  - OS stable  - monitor  4,5. Hypertensive retinopathy OU - discussed importance of tight BP control - monitor  6. Pseudophakia OD  - s/p CE/IOL OD (02.29.24, Dr. Paulene Boron)  - IOL in good position, doing  well  - monitor  7. Mixed Cataract OS - The symptoms of cataract, surgical options, and treatments and risks were discussed with patient. - discussed diagnosis and progression - under the expert management of Dr. Paulene Boron  8. Glaucoma suspect  - C/D 0.7 OU  - IOPs okay (11,10)  - under the expert management of Dr. Paulene Boron  Ophthalmic Meds Ordered this visit:  Meds ordered this encounter  Medications   faricimab -svoa (VABYSMO ) 6mg /0.45mL intravitreal injection     Return in about 5 weeks (around 12/06/2023) for f/u NPDR OU, DFE, OCT, Possible Injxn.  There are no Patient Instructions on file for this visit.  This document serves as a record of services personally performed by Jeanice Millard, MD, PhD. It was created on their behalf by Angelia Kelp, an ophthalmic technician. The creation of this record is the provider's dictation and/or activities during the visit.    Electronically signed by: Angelia Kelp, OA, 11/01/23  12:33 PM  This document serves as a record of services personally performed by Jeanice Millard, MD, PhD. It was created on their behalf by Morley Arabia. Bevin Bucks, OA an ophthalmic technician. The creation of this record is the provider's dictation and/or activities during the visit.  Electronically signed by: Morley Arabia. Antionette Kirks 11/01/23 12:33 PM  Jeanice Millard, M.D., Ph.D. Diseases & Surgery of the Retina and Vitreous [facility]  I have reviewed the above documentation for accuracy and completeness, and I agree with the above. Jeanice Millard, M.D., Ph.D. 11/01/23 12:34 PM   Abbreviations: M myopia (nearsighted); A astigmatism; H hyperopia (farsighted); P presbyopia; Mrx spectacle prescription;  CTL contact lenses; OD right eye; OS left eye; OU both eyes  XT exotropia; ET esotropia; PEK punctate epithelial keratitis; PEE punctate epithelial erosions; DES dry eye syndrome; MGD meibomian gland dysfunction; ATs artificial tears; PFAT's preservative  free artificial tears; NSC nuclear sclerotic cataract; PSC posterior subcapsular cataract; ERM epi-retinal membrane; PVD posterior vitreous detachment; RD retinal detachment; DM diabetes mellitus; DR diabetic retinopathy; NPDR non-proliferative diabetic retinopathy; PDR proliferative diabetic retinopathy; CSME clinically significant macular edema; DME diabetic macular edema; dbh dot blot hemorrhages; CWS cotton wool spot; POAG primary open angle glaucoma; C/D cup-to-disc ratio; HVF humphrey visual field; GVF goldmann visual field; OCT optical coherence tomography; IOP intraocular pressure; BRVO Branch retinal vein occlusion; CRVO central retinal vein occlusion; CRAO central retinal artery occlusion; BRAO branch retinal artery occlusion; RT retinal tear; SB scleral buckle; PPV pars plana vitrectomy; VH Vitreous hemorrhage; PRP panretinal laser photocoagulation; IVK intravitreal kenalog; VMT vitreomacular traction; MH Macular hole;  NVD neovascularization of the disc; NVE neovascularization elsewhere; AREDS age related eye disease study; ARMD age related macular degeneration; POAG primary open angle glaucoma; EBMD epithelial/anterior basement membrane dystrophy; ACIOL anterior chamber intraocular lens; IOL intraocular lens; PCIOL posterior chamber intraocular lens; Phaco/IOL phacoemulsification with intraocular lens placement; PRK photorefractive keratectomy; LASIK laser assisted in situ keratomileusis; HTN hypertension; DM diabetes mellitus; COPD chronic obstructive pulmonary disease

## 2023-11-01 ENCOUNTER — Ambulatory Visit (INDEPENDENT_AMBULATORY_CARE_PROVIDER_SITE_OTHER): Admitting: Ophthalmology

## 2023-11-01 ENCOUNTER — Encounter (INDEPENDENT_AMBULATORY_CARE_PROVIDER_SITE_OTHER): Payer: Self-pay | Admitting: Ophthalmology

## 2023-11-01 DIAGNOSIS — Z7984 Long term (current) use of oral hypoglycemic drugs: Secondary | ICD-10-CM | POA: Diagnosis not present

## 2023-11-01 DIAGNOSIS — H43823 Vitreomacular adhesion, bilateral: Secondary | ICD-10-CM | POA: Diagnosis not present

## 2023-11-01 DIAGNOSIS — E113313 Type 2 diabetes mellitus with moderate nonproliferative diabetic retinopathy with macular edema, bilateral: Secondary | ICD-10-CM

## 2023-11-01 DIAGNOSIS — H35033 Hypertensive retinopathy, bilateral: Secondary | ICD-10-CM

## 2023-11-01 DIAGNOSIS — Z961 Presence of intraocular lens: Secondary | ICD-10-CM | POA: Diagnosis not present

## 2023-11-01 DIAGNOSIS — I10 Essential (primary) hypertension: Secondary | ICD-10-CM

## 2023-11-01 DIAGNOSIS — H40003 Preglaucoma, unspecified, bilateral: Secondary | ICD-10-CM

## 2023-11-01 DIAGNOSIS — H25812 Combined forms of age-related cataract, left eye: Secondary | ICD-10-CM | POA: Diagnosis not present

## 2023-11-01 MED ORDER — FARICIMAB-SVOA 6 MG/0.05ML IZ SOLN
6.0000 mg | INTRAVITREAL | Status: AC | PRN
  Administered 2023-11-01: 6 mg via INTRAVITREAL

## 2023-11-02 ENCOUNTER — Ambulatory Visit: Payer: Medicare HMO | Admitting: Family Medicine

## 2023-11-03 ENCOUNTER — Encounter: Payer: Self-pay | Admitting: Family Medicine

## 2023-11-03 ENCOUNTER — Ambulatory Visit (INDEPENDENT_AMBULATORY_CARE_PROVIDER_SITE_OTHER): Admitting: Family Medicine

## 2023-11-03 ENCOUNTER — Ambulatory Visit: Attending: Family Medicine

## 2023-11-03 VITALS — BP 121/75 | HR 69 | Ht 64.0 in | Wt 159.1 lb

## 2023-11-03 DIAGNOSIS — I739 Peripheral vascular disease, unspecified: Secondary | ICD-10-CM | POA: Diagnosis not present

## 2023-11-03 DIAGNOSIS — R21 Rash and other nonspecific skin eruption: Secondary | ICD-10-CM

## 2023-11-03 DIAGNOSIS — R002 Palpitations: Secondary | ICD-10-CM

## 2023-11-03 DIAGNOSIS — Z7984 Long term (current) use of oral hypoglycemic drugs: Secondary | ICD-10-CM

## 2023-11-03 DIAGNOSIS — I480 Paroxysmal atrial fibrillation: Secondary | ICD-10-CM | POA: Diagnosis not present

## 2023-11-03 DIAGNOSIS — E785 Hyperlipidemia, unspecified: Secondary | ICD-10-CM

## 2023-11-03 DIAGNOSIS — E1169 Type 2 diabetes mellitus with other specified complication: Secondary | ICD-10-CM | POA: Diagnosis not present

## 2023-11-03 DIAGNOSIS — E1152 Type 2 diabetes mellitus with diabetic peripheral angiopathy with gangrene: Secondary | ICD-10-CM

## 2023-11-03 DIAGNOSIS — E039 Hypothyroidism, unspecified: Secondary | ICD-10-CM

## 2023-11-03 LAB — POCT GLYCOSYLATED HEMOGLOBIN (HGB A1C): Hemoglobin A1C: 6.4 % — AB (ref 4.0–5.6)

## 2023-11-03 NOTE — Progress Notes (Unsigned)
 EP to read.

## 2023-11-03 NOTE — Progress Notes (Signed)
 Erin Mitchell [facility] - 78 y.o. female MRN [id number]  Date of birth: 1946-03-19  Subjective Chief Complaint  Patient presents with   Diabetes    Requested last eye exam to be faxed   Rash    Pt c/o rash at her bra line she has stopped wearing under wire bras. She has been using a cream which has helped her with the rash     HPI Erin Mitchell is a 78 y.o. female here today for follow up visit.   She reports that she is is doing pretty well.    She continues on metformin  for management of diabetes.  No side effects at current strength.  A1c today is 6.4%.   BP is well controlled at this time.  Doing well with current medications and denies side effects from medication.  She has history of isolated episode of PAF.  Rate controlled with metoprolol .  Mild aortic stenosis on recent Echo.  She has had increased palpitations over the past week.  She does see cardiology.  Not anticoagulated due to this being a remote, single episode.    She remains on plavix  for PAD.  Doing well with this.   Irritated area under bilateral breast due to underwire from bra.  Denies bleeding or significant pain.   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 & 3rd   VENTRAL HERNIA REPAIR N/A 12/28/2020   Procedure: LAPAROSCOPIC VENTRAL INCISIONAL HERNIA REPAIR WITH MESH;  Surgeon: Oralee Billow, MD;  Location: [facility];  Service:  General;  Laterality: N/A;    Social History   Socioeconomic History   Marital status: Married    Spouse name: Not on file   Number of children: 7   Years of education: Not on file   Highest education level: Not on file  Occupational History   Occupation: Retired  Tobacco Use   Smoking status: Former    Current packs/day: 0.00    Average packs/day: 0.3 packs/day for 1 year (0.3 ttl pk-yrs)    Types: Cigarettes    Start date: 06/06/1969    Quit date: 06/06/1970    Years since quitting: 53.4   Smokeless tobacco: Never  Vaping Use   Vaping status: Never Used  Substance and Sexual Activity   Alcohol use: Never   Drug use: Never   Sexual activity: Not Currently    Partners: Male  Other Topics Concern   Not on file  Social History Narrative   Not on file   Social Drivers of Health   Financial Resource Strain: Not on file  Food Insecurity: Not on file  Transportation Needs: Not on file  Physical Activity: Not on file  Stress: Not on file  Social Connections: Not on file  Family History  Problem Relation Age of Onset   Hypertension Father    Diabetes Brother    Emphysema Mother     Health Maintenance  Topic Date Due   Medicare Annual Wellness (AWV)  Never done   Zoster Vaccines- Shingrix (1 of 2) Never done   COVID-19 Vaccine (3 - 2024-25 season) 02/05/2023   FOOT EXAM  10/19/2023   INFLUENZA VACCINE  01/05/2024   Diabetic kidney evaluation - eGFR measurement  01/30/2024   Diabetic kidney evaluation - Urine ACR  01/30/2024   HEMOGLOBIN A1C  05/05/2024   OPHTHALMOLOGY EXAM  10/31/2024   DTaP/Tdap/Td (2 - Td or Tdap) 08/15/2029   Pneumonia Vaccine 30+ Years old  Completed   HPV VACCINES  Aged Out   Meningococcal B Vaccine  Aged Out   DEXA SCAN  Discontinued   Colonoscopy  Discontinued   Hepatitis C Screening  Discontinued      ----------------------------------------------------------------------------------------------------------------------------------------------------------------------------------------------------------------- Physical Exam BP 121/75   Pulse 69   Ht 5\' 4"  (1.626 m)   Wt 159 lb 1.9 oz (72.2 kg)   SpO2 98%   BMI 27.31 kg/m   Physical Exam Constitutional:      Appearance: Normal appearance.  HENT:     Head: Normocephalic and atraumatic.  Eyes:     General: No scleral icterus. Cardiovascular:     Rate and Rhythm: Normal rate and regular rhythm.  Pulmonary:     Effort: Pulmonary effort is normal.     Breath sounds: Normal breath sounds.  Musculoskeletal:     Cervical back: Neck supple.  Neurological:     Mental Status: She is alert.  Psychiatric:        Mood and Affect: Mood normal.        Behavior: Behavior normal.     ------------------------------------------------------------------------------------------------------------------------------------------------------------------------------------------------------------------- Assessment and Plan  Type 2 diabetes mellitus with diabetic peripheral angiopathy and gangrene, without long-term current use of insulin  (HCC) Blood sugars well controlled on last check in December.  Recommend continued dietary changes.  She will remain on metformin  for now.    PAF (paroxysmal atrial fibrillation) (HCC) Solitary episode after surgery in the past.  She is having increased palpitations over the past week or two.  Zio patch ordered.   Acquired hypothyroidism She remains on NP thyroid .  Feels well with this.   Lab Results  Component Value Date   TSH 0.718 01/30/2023     PAD (peripheral artery disease) (HCC) Status post angioplasty.  She does continue to see vascular surgery.  She will remain on Plavix   Hyperlipidemia associated with type 2 diabetes mellitus (HCC) Doing well with rosuvastatin .  Continue current strength.   Updated lipid panel ordered.   Rash Irritated areas under b/l breasts.  Recommend using aquaphor or similar over areas.     No orders of the defined types were placed in this encounter.   Return in about 6 months (around 05/05/2024) for Hypertension, Type 2 Diabetes.

## 2023-11-03 NOTE — Assessment & Plan Note (Signed)
 Status post angioplasty.  She does continue to see vascular surgery.  She will remain on Plavix

## 2023-11-03 NOTE — Assessment & Plan Note (Signed)
 Blood sugars well controlled on last check in December.  Recommend continued dietary changes.  She will remain on metformin  for now.

## 2023-11-03 NOTE — Assessment & Plan Note (Signed)
 Irritated areas under b/l breasts.  Recommend using aquaphor or similar over areas.

## 2023-11-03 NOTE — Assessment & Plan Note (Signed)
 She remains on NP thyroid.  Feels well with this.   Lab Results  Component Value Date   TSH 0.718 01/30/2023

## 2023-11-03 NOTE — Assessment & Plan Note (Signed)
 Solitary episode after surgery in the past.  She is having increased palpitations over the past week or two.  Zio patch ordered.

## 2023-11-03 NOTE — Assessment & Plan Note (Signed)
 Doing well with rosuvastatin.  Continue current strength.  Updated lipid panel ordered.

## 2023-11-06 ENCOUNTER — Telehealth: Payer: Self-pay

## 2023-11-06 NOTE — Telephone Encounter (Signed)
 Spoke with patient - Dr. Augustus Ledger out office  on vacation. She will await Dr. Augustus Ledger return and if he forgets what he had recommended she is o.k. to just discuss again at the next appt.

## 2023-11-06 NOTE — Telephone Encounter (Signed)
 Copied from CRM [id number]. Topic: Clinical - Medication Question >> Nov 03, 2023  3:44 PM Retta Caster wrote: Reason for CRM: Patient had app 05/30 at 8:50 am and PCP recommended 2 supplements to her but she forgot to have PCP write it down. Needs call back on 2 supplements 1 for Leg Cramps and 1 for Neuropathy. [phone]

## 2023-11-09 ENCOUNTER — Encounter: Payer: Self-pay | Admitting: Podiatry

## 2023-11-09 ENCOUNTER — Ambulatory Visit: Admitting: Podiatry

## 2023-11-09 DIAGNOSIS — I739 Peripheral vascular disease, unspecified: Secondary | ICD-10-CM

## 2023-11-09 DIAGNOSIS — M79675 Pain in left toe(s): Secondary | ICD-10-CM | POA: Diagnosis not present

## 2023-11-09 DIAGNOSIS — B351 Tinea unguium: Secondary | ICD-10-CM | POA: Diagnosis not present

## 2023-11-09 DIAGNOSIS — E1152 Type 2 diabetes mellitus with diabetic peripheral angiopathy with gangrene: Secondary | ICD-10-CM

## 2023-11-09 DIAGNOSIS — Z89429 Acquired absence of other toe(s), unspecified side: Secondary | ICD-10-CM

## 2023-11-09 DIAGNOSIS — M79674 Pain in right toe(s): Secondary | ICD-10-CM | POA: Diagnosis not present

## 2023-11-09 DIAGNOSIS — E119 Type 2 diabetes mellitus without complications: Secondary | ICD-10-CM

## 2023-11-09 NOTE — Progress Notes (Signed)
  Subjective:  Patient ID: Erin Mitchell, female    DOB: 1945/11/15,   MRN: [id number]  No chief complaint on file.   78 y.o. female presents for concern of thickened elongated and painful nails that are difficult to trim. Requesting to have them trimmed today. Relates burning and tingling in their feet. Patient is diabetic and last A1c was  Lab Results  Component Value Date   HGBA1C 6.4 (A) 11/03/2023   .   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 Also has a history of PAD . Denies any other pedal complaints. Denies n/v/f/c.   PCP: Adela Holter MD   Past Medical History:  Diagnosis Date   Atrial fibrillation (HCC)    on Plavix    Cataract    Diabetes (HCC)    Diabetic retinopathy (HCC)    Exposure to Agent Orange    Hypertension    Hypertensive retinopathy    hypothyroidism    Hypothyroidism    Kidney disease     Objective:  Physical Exam: Vascular: DP/PT pulses 2/4 bilateral. CFT <3 seconds. Absent hair growth on digits. No edema.  Skin. No lacerations or abrasions bilateral feet. Nails 1-5 are thickened discolored and elongated with subungual debris.  Right great toe hhealed well no open areas.  Musculoskeletal: MMT 5/5 bilateral lower extremities in DF, PF, Inversion and Eversion. Deceased ROM in DF of ankle joint. Right second and third digits amputated. Severe bunion of right foot.  Neurological: Sensation intact to light touch.   Assessment:   1. Dermatophytosis of nail   2. Pain in toes of both feet   3. PAD (peripheral artery disease) (HCC)   4. Type 2 diabetes mellitus with diabetic peripheral angiopathy and gangrene, without long-term current use of insulin  (HCC)            Plan:  Patient was evaluated and treated and all questions answered. -Discussed and educated patient on diabetic foot care, especially with  regards to the vascular, neurological and musculoskeletal systems.   -Stressed the importance of good glycemic control and the detriment of not  controlling glucose levels in relation to the foot. -Discussed supportive shoes at all times and checking feet regularly.  -Mechanically debrided all nails 1-5 bilateral using sterile nail nipper and filed with dremel without incident  -Answered all patient questions -DM shoe prescription provided.  -Patient to return  in 3 months for at risk foot care -Patient advised to call the office if any problems or questions arise in the meantime.    Jennefer Moats, DPM

## 2023-11-16 NOTE — Telephone Encounter (Signed)
 Patient informed.

## 2023-11-20 ENCOUNTER — Telehealth: Payer: Self-pay | Admitting: Podiatry

## 2023-11-20 NOTE — Telephone Encounter (Signed)
 Pt need order/prescription faxed [facility].

## 2023-11-21 NOTE — Telephone Encounter (Signed)
 Thank you :)

## 2023-11-24 DIAGNOSIS — R002 Palpitations: Secondary | ICD-10-CM | POA: Diagnosis not present

## 2023-11-25 ENCOUNTER — Other Ambulatory Visit: Payer: Self-pay | Admitting: Family Medicine

## 2023-11-28 DIAGNOSIS — R002 Palpitations: Secondary | ICD-10-CM

## 2023-11-28 NOTE — Progress Notes (Signed)
 [facility] - Clinic Note  12/06/2023     CHIEF COMPLAINT Patient presents for Retina Follow Up  HISTORY OF PRESENT ILLNESS: Erin Mitchell is a 78 y.o. female who presents to the clinic today for:   HPI     Retina Follow Up   Patient presents with  Diabetic Retinopathy.  In both eyes.  This started 5.  Duration of 5 weeks.  Since onset it is stable.  I, the attending physician,  performed the HPI with the patient and updated documentation appropriately.        Comments   5 week retina follow up NPDR OU and IVV OD pt is reporting no vision changes noticed she denies any flashes has some floaters her last reading 128 this am A1C 6.4      Last edited by Valdemar Rogue, MD on 12/06/2023 11:18 AM.    Pt feels states the bump that she was seeing in her right eye vision is gone now, she still has a little bit of distortion if she looks at objects with just that eye  Referring physician: Alvia Bring, DO [address] [facility] [address]  [address] [place],  [place] [address]  HISTORICAL INFORMATION:   Selected notes from the MEDICAL RECORD NUMBER Referred by Dr. Bring Alvia for DM exam LEE:  Ocular Hx- PMH- DM, HTN, afib, kidney disease, last a1c was 6.8 on 05.05.22    CURRENT MEDICATIONS: No current outpatient medications on file. (Ophthalmic Drugs)   No current facility-administered medications for this visit. (Ophthalmic Drugs)   Current Outpatient Medications (Other)  Medication Sig   Alcohol Swabs (DROPSAFE ALCOHOL PREP) 70 % PADS USE AS DIRECTED.   Blood Glucose Monitoring Suppl (TRUE METRIX METER) w/Device KIT Use as directed to test blood glucose daily   Cholecalciferol (VITAMIN D-1000 MAX ST PO) Take 2,000 Units by mouth daily.   clopidogrel  (PLAVIX ) 75 MG tablet TAKE 1 TABLET EVERY DAY   glucose blood (TRUE METRIX BLOOD GLUCOSE TEST) test strip USE AS INSTRUCTED   loperamide  (IMODIUM  A-D) 2 MG tablet Take 2-4 mg by mouth 4 (four) times daily  as needed for diarrhea or loose stools.   losartan  (COZAAR ) 100 MG tablet TAKE 1 TABLET EVERY DAY   Melatonin 10 MG CAPS Take 10 mg by mouth at bedtime.   metFORMIN  (GLUCOPHAGE -XR) 500 MG 24 hr tablet TAKE 2 TABLETS TWICE DAILY WITH MEALS   metoprolol  tartrate (LOPRESSOR ) 25 MG tablet Take 1 tablet (25 mg total) by mouth 2 (two) times daily.   NP THYROID  90 MG tablet TAKE 1 TABLET BY MOUTH EVERY DAY *NOT COVERED   Probiotic Product (ALIGN) 4 MG CAPS Take 4 mg by mouth daily.   rosuvastatin  (CRESTOR ) 40 MG tablet TAKE 1 TABLET EVERY DAY   TRUEplus Lancets 33G MISC Use as directed daily.   Zinc Sulfate (ZINC 15 PO) Take by mouth.   No current facility-administered medications for this visit. (Other)   REVIEW OF SYSTEMS: ROS   Positive for: Endocrine, Cardiovascular, Eyes Negative for: Constitutional, Gastrointestinal, Neurological, Skin, Genitourinary, Musculoskeletal, HENT, Respiratory, Psychiatric, Allergic/Imm, Heme/Lymph Last edited by Resa Delon ORN, COT on 12/06/2023  8:02 AM.      ALLERGIES Allergies  Allergen Reactions   Farxiga  [Dapagliflozin ] Other (See Comments)    Yeast infections    PAST MEDICAL HISTORY Past Medical History:  Diagnosis Date   Atrial fibrillation (HCC)    on Plavix    Cataract    Diabetes (HCC)  Diabetic retinopathy (HCC)    Exposure to Agent Orange    Hypertension    Hypertensive retinopathy    hypothyroidism    Hypothyroidism    Kidney disease    Past Surgical History:  Procedure Laterality Date   APPENDECTOMY     CESAREAN SECTION     CHOLECYSTECTOMY     HERNIA REPAIR     TOE AMPUTATION Right    2nd & 3rd   VENTRAL HERNIA REPAIR N/A 12/28/2020   Procedure: LAPAROSCOPIC VENTRAL INCISIONAL HERNIA REPAIR WITH MESH;  Surgeon: Eletha Boas, MD;  Location: [facility];  Service: General;  Laterality: N/A;   FAMILY HISTORY Family History  Problem Relation Age of Onset   Hypertension Father    Diabetes Brother    Emphysema Mother     SOCIAL HISTORY Social History   Tobacco Use   Smoking status: Former    Current packs/day: 0.00    Average packs/day: 0.3 packs/day for 1 year (0.3 ttl pk-yrs)    Types: Cigarettes    Start date: 06/06/1969    Quit date: 06/06/1970    Years since quitting: 53.5   Smokeless tobacco: Never  Vaping Use   Vaping status: Never Used  Substance Use Topics   Alcohol use: Never   Drug use: Never       OPHTHALMIC EXAM: Base Eye Exam     Visual Acuity (Snellen - Linear)       Right Left   Dist cc 20/30 20/20 -2   Dist ph cc NI     Correction: Glasses         Tonometry (Tonopen, 8:07 AM)       Right Left   Pressure 10 13         Pupils       Pupils Dark Light Shape React APD   Right PERRL 2 1 Round Brisk None   Left PERRL 2 1 Round Brisk None         Visual Fields       Left Right    Full Full         Extraocular Movement       Right Left    Full, Ortho Full, Ortho         Neuro/Psych     Oriented x3: Yes   Mood/Affect: Normal         Dilation     Both eyes: 2.5% Phenylephrine  @ 8:07 AM           Slit Lamp and Fundus Exam     Slit Lamp Exam       Right Left   Lids/Lashes Dermatochalasis - upper lid, Meibomian gland dysfunction Dermatochalasis - upper lid, Meibomian gland dysfunction   Conjunctiva/Sclera White and quiet White and quiet   Cornea arcus, trace tear film debris, well healed cataract wound arcus, trace PEE, mild tear film debris   Anterior Chamber Moderate depth, clear, very narrow temporal angles Moderate depth, clear, very narrow temporal angles   Iris Round and moderately dilated, mild anterior bowing Round and moderately dilated, mild anterior bowing   Lens PC IOL in good position 2-3+ Nuclear sclerosis with brunescence, 2-3+ Cortical cataract   Anterior Vitreous Vitreous syneresis, Posterior vitreous detachment, +vitreous condensations Vitreous syneresis, Posterior vitreous detachment, vitreous condensations          Fundus Exam       Right Left   Disc Trace pallor, Sharp rim, +cupping Pink and Sharp, +cupping   C/D Ratio 0.7  0.7   Macula Blunted foveal reflex, central cystic changes / edema -- slightly improved, RPE mottling, fine drusen, pigment clumping, cluster of MA temporal to fovea and temporal macula -- stably improved -- no heme, mild ERM, punctate central exudation Flat, Blunted foveal reflex, RPE mottling, no heme, no exudates, trace cystic changes nasal macula   Vessels attenuated, Tortuous attenuated, Tortuous   Periphery Attached, mild reticular degeneration, rare MA Attached, mild reticular degeneration, rare MA           Refraction     Wearing Rx       Sphere Cylinder Axis Add   Right -0.50 +0.75 166 +2.75   Left +1.75 Sphere  +2.75    Type: Bifocal           IMAGING AND PROCEDURES  Imaging and Procedures for 12/06/2023  OCT, Retina - OU - Both Eyes       Right Eye Quality was good. Central Foveal Thickness: 297. Progression has improved. Findings include no SRF, abnormal foveal contour, intraretinal hyper-reflective material, epiretinal membrane, intraretinal fluid, vitreomacular adhesion (Persistent central cyst and surrounding cystic changes -- slightly improved).   Left Eye Quality was good. Central Foveal Thickness: 265. Progression has been stable. Findings include no SRF, abnormal foveal contour, intraretinal fluid, vitreous traction, vitreomacular adhesion (persistent central VMT with trace cystic changes, trace cystic changes nasal mac).   Notes *Images captured and stored on drive  Diagnosis / Impression:  OD: +central DME, Persistent central cyst and surrounding cystic changes -- slightly improved OS: persistent central VMT with trace cystic changes, trace cystic changes nasal mac  Clinical management:  See below  Abbreviations: NFP - Normal foveal profile. CME - cystoid macular edema. PED - pigment epithelial detachment. IRF - intraretinal fluid.  SRF - subretinal fluid. EZ - ellipsoid zone. ERM - epiretinal membrane. ORA - outer retinal atrophy. ORT - outer retinal tubulation. SRHM - subretinal hyper-reflective material. IRHM - intraretinal hyper-reflective material      Intravitreal Injection, Pharmacologic Agent - OD - Right Eye       Time Out 12/06/2023. 8:46 AM. Confirmed correct patient, procedure, site, and patient consented.   Anesthesia Topical anesthesia was used. Anesthetic medications included Lidocaine  2%, Proparacaine 0.5%.   Procedure Preparation included 5% betadine to ocular surface, eyelid speculum. A (32g) needle was used.   Injection: 6 mg faricimab -svoa 6 MG/0.05ML (Patient supplied)   Route: Intravitreal, Site: Right Eye   NDC: 49757-903-98, Lot: [id number], Expiration date: 12/03/2025, Waste: 0 mL   Post-op Post injection exam found visual acuity of at least counting fingers. The patient tolerated the procedure well. There were no complications. The patient received written and verbal post procedure care education. Post injection medications were not given.   Notes **SAMPLE MEDICATION ADMINISTERED**             ASSESSMENT/PLAN:    ICD-10-CM   1. Moderate nonproliferative diabetic retinopathy of both eyes with macular edema associated with type 2 diabetes mellitus (HCC)  E11.3313 OCT, Retina - OU - Both Eyes    Intravitreal Injection, Pharmacologic Agent - OD - Right Eye    faricimab -svoa (VABYSMO ) 6mg /0.19mL intravitreal injection    2. Long term (current) use of oral hypoglycemic drugs  Z79.84     3. 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 Essential hypertension  I10     5. Hypertensive retinopathy of both eyes  H35.033     6. Pseudophakia  Z96.1     7.  Combined forms of age-related cataract of left eye  H25.812     8. Glaucoma suspect of both eyes  H40.003       1,2. Moderate non-proliferative diabetic retinopathy, both eyes  - A1c: 6.3 on 12.16.24  - s/p IVE OD #1  (06.22.22)--sample, #2 (09.07.22), #3 (11.14.22), #4 (12.28.22), #5 (02.08.23), IVE #6 (03.15.23), #7 (04.19.23), #8 (05.24.23), #9 (06.28.23), #10 (07.27.23), #11 (08.24.23) -- IVE resistance  - s/p IVV OD #1 (09.21.23), #2 (10.19.23), #3 (11.16.23), #4 (12.14.23), #5 (01.18.24), #6 (02.21.24), #7 (03.27.24), #8 (05.01.24), #9 (06.05.24), #10 (07.09.24), #11 (08.14.24), #12 (09.18.24), #13 (10.23.24), #14 (11.27.24), #15 (sample -- 01.02.25), #16 (sample -- 02.05.25), #17 (sample -- 03.19.25), #18 (sample -- 04.23.25), #19 (sample--05.28.25) - previously seen at [facility] by Dr. Phyllistine (Fax [phone]) - history of anti-VEGF injections since 2019 -- last injection IVE OD Feb/Mar 2022 -- q7mo interval - FA (11.14.22) shows OD: Focal hyperflourescence IT to fovea; no NV; OS: No NV - exam w/ scattered MA - OCT shows OD: +central DME, Persistent central cyst and surrounding cystic changes -- slightly improved; OS: persistent central VMT at 5 wks - BCVA OD stable at 20/30; OS 20/20 -- stable. - recommend IVV OD #20 (sample) today, 07.02.25 -- w/ f/u in 5 wks - pt wishes to proceed - RBA of procedure discussed, questions answered - IVV informed consent obtained and signed, 02.05.25 - see procedure note - Vabysmo  PAP pending - f/u in 5 wks  -- DFE/OCT, possible injection  3. VMA/VMT OU  - central VMA/VMT OD likely contributing to central cyst  - OS stable  - monitor  4,5. Hypertensive retinopathy OU - discussed importance of tight BP control - monitor  6. Pseudophakia OD  - s/p CE/IOL OD (02.29.24, Dr. Austin)  - IOL in good position, doing well  - monitor  7. Mixed Cataract OS - The symptoms of cataract, surgical options, and treatments and risks were discussed with patient. - discussed diagnosis and progression - under the expert management of Dr. Austin  8. Glaucoma suspect  - C/D 0.7 OU  - IOPs okay (10,13)  - under the expert management of Dr. Austin  Ophthalmic Meds  Ordered this visit:  Meds ordered this encounter  Medications   faricimab -svoa (VABYSMO ) 6mg /0.67mL intravitreal injection     Return in about 5 weeks (around 01/10/2024) for f/u NPDR OU, DFE, OCT, Possible Injxn.  There are no Patient Instructions on file for this visit.  This document serves as a record of services personally performed by Redell JUDITHANN Hans, MD, PhD. It was created on their behalf by Almetta Pesa, an ophthalmic technician. The creation of this record is the provider's dictation and/or activities during the visit.    Electronically signed by: Almetta Pesa, OA, 12/06/23  11:33 AM  This document serves as a record of services personally performed by Redell JUDITHANN Hans, MD, PhD. It was created on their behalf by Alan PARAS. Delores, OA an ophthalmic technician. The creation of this record is the provider's dictation and/or activities during the visit.    Electronically signed by: Alan PARAS. Delores, OA 12/06/23 11:33 AM  Redell JUDITHANN Hans, M.D., Ph.D. Diseases & Surgery of the Retina and Vitreous [facility]  I have reviewed the above documentation for accuracy and completeness, and I agree with the above. Redell JUDITHANN Hans, M.D., Ph.D. 12/06/23 11:36 AM   Abbreviations: M myopia (nearsighted); A astigmatism; H hyperopia (farsighted); P presbyopia; Mrx spectacle prescription;  CTL contact lenses; OD right eye; OS left eye; OU both eyes  XT exotropia; ET esotropia; PEK punctate epithelial keratitis; PEE punctate epithelial erosions; DES dry eye syndrome; MGD meibomian gland dysfunction; ATs artificial tears; PFAT's preservative free artificial tears; NSC nuclear sclerotic cataract; PSC posterior subcapsular cataract; ERM epi-retinal membrane; PVD posterior vitreous detachment; RD retinal detachment; DM diabetes mellitus; DR diabetic retinopathy; NPDR non-proliferative diabetic retinopathy; PDR proliferative diabetic retinopathy; CSME clinically significant macular  edema; DME diabetic macular edema; dbh dot blot hemorrhages; CWS cotton wool spot; POAG primary open angle glaucoma; C/D cup-to-disc ratio; HVF humphrey visual field; GVF goldmann visual field; OCT optical coherence tomography; IOP intraocular pressure; BRVO Branch retinal vein occlusion; CRVO central retinal vein occlusion; CRAO central retinal artery occlusion; BRAO branch retinal artery occlusion; RT retinal tear; SB scleral buckle; PPV pars plana vitrectomy; VH Vitreous hemorrhage; PRP panretinal laser photocoagulation; IVK intravitreal kenalog; VMT vitreomacular traction; MH Macular hole;  NVD neovascularization of the disc; NVE neovascularization elsewhere; AREDS age related eye disease study; ARMD age related macular degeneration; POAG primary open angle glaucoma; EBMD epithelial/anterior basement membrane dystrophy; ACIOL anterior chamber intraocular lens; IOL intraocular lens; PCIOL posterior chamber intraocular lens; Phaco/IOL phacoemulsification with intraocular lens placement; PRK photorefractive keratectomy; LASIK laser assisted in situ keratomileusis; HTN hypertension; DM diabetes mellitus; COPD chronic obstructive pulmonary disease

## 2023-11-29 DIAGNOSIS — H5202 Hypermetropia, left eye: Secondary | ICD-10-CM | POA: Diagnosis not present

## 2023-11-29 DIAGNOSIS — H524 Presbyopia: Secondary | ICD-10-CM | POA: Diagnosis not present

## 2023-11-29 DIAGNOSIS — E113511 Type 2 diabetes mellitus with proliferative diabetic retinopathy with macular edema, right eye: Secondary | ICD-10-CM | POA: Diagnosis not present

## 2023-11-29 DIAGNOSIS — H52223 Regular astigmatism, bilateral: Secondary | ICD-10-CM | POA: Diagnosis not present

## 2023-11-29 DIAGNOSIS — H5211 Myopia, right eye: Secondary | ICD-10-CM | POA: Diagnosis not present

## 2023-12-06 ENCOUNTER — Ambulatory Visit (INDEPENDENT_AMBULATORY_CARE_PROVIDER_SITE_OTHER): Admitting: Ophthalmology

## 2023-12-06 ENCOUNTER — Encounter (INDEPENDENT_AMBULATORY_CARE_PROVIDER_SITE_OTHER): Payer: Self-pay | Admitting: Ophthalmology

## 2023-12-06 DIAGNOSIS — Z961 Presence of intraocular lens: Secondary | ICD-10-CM

## 2023-12-06 DIAGNOSIS — Z7984 Long term (current) use of oral hypoglycemic drugs: Secondary | ICD-10-CM

## 2023-12-06 DIAGNOSIS — E113313 Type 2 diabetes mellitus with moderate nonproliferative diabetic retinopathy with macular edema, bilateral: Secondary | ICD-10-CM

## 2023-12-06 DIAGNOSIS — H35033 Hypertensive retinopathy, bilateral: Secondary | ICD-10-CM

## 2023-12-06 DIAGNOSIS — I10 Essential (primary) hypertension: Secondary | ICD-10-CM | POA: Diagnosis not present

## 2023-12-06 DIAGNOSIS — H40003 Preglaucoma, unspecified, bilateral: Secondary | ICD-10-CM | POA: Diagnosis not present

## 2023-12-06 DIAGNOSIS — H43823 Vitreomacular adhesion, bilateral: Secondary | ICD-10-CM | POA: Diagnosis not present

## 2023-12-06 DIAGNOSIS — H25812 Combined forms of age-related cataract, left eye: Secondary | ICD-10-CM | POA: Diagnosis not present

## 2023-12-06 LAB — HM DIABETES EYE EXAM

## 2023-12-06 MED ORDER — FARICIMAB-SVOA 6 MG/0.05ML IZ SOLN
6.0000 mg | INTRAVITREAL | Status: AC | PRN
  Administered 2023-12-06: 6 mg via INTRAVITREAL

## 2023-12-10 ENCOUNTER — Ambulatory Visit: Payer: Self-pay | Admitting: Family Medicine

## 2023-12-13 ENCOUNTER — Encounter (HOSPITAL_COMMUNITY)

## 2023-12-13 ENCOUNTER — Ambulatory Visit

## 2024-01-01 ENCOUNTER — Telehealth: Payer: Self-pay | Admitting: Podiatry

## 2024-01-01 NOTE — Telephone Encounter (Signed)
 Erin Mitchell still hasn't received referral for orthotics. Patient was told shew cannot bring script in with her. It has to be sent over from you. FAX no [phone]

## 2024-01-01 NOTE — Progress Notes (Signed)
 [facility] - Clinic Note  01/10/2024     CHIEF COMPLAINT Patient presents for Retina Follow Up  HISTORY OF PRESENT ILLNESS: Erin Mitchell is a 78 y.o. female who presents to the clinic today for:   HPI     Retina Follow Up   Patient presents with  Diabetic Retinopathy.  In both eyes.  This started 5 weeks ago.  Duration of 5 weeks.  Since onset it is stable.  I, the attending physician,  performed the HPI with the patient and updated documentation appropriately.        Comments   5 week retina follow up PDR OU and IVV pt is reporting no vision changes noticed she has some floaters denies any flashes has last reading was 143      Last edited by Valdemar Rogue, MD on 01/10/2024  1:59 PM.     Pt states she does feel OD VA is clearer, easier to read.   Referring physician: Alvia Bring, DO [address]  [address] [place],  [place] [address]  HISTORICAL INFORMATION:   Selected notes from the MEDICAL RECORD NUMBER Referred by Dr. Bring Alvia for DM exam LEE:  Ocular Hx- PMH- DM, HTN, afib, kidney disease, last a1c was 6.8 on 05.05.22    CURRENT MEDICATIONS: No current outpatient medications on file. (Ophthalmic Drugs)   No current facility-administered medications for this visit. (Ophthalmic Drugs)   Current Outpatient Medications (Other)  Medication Sig   Alcohol Swabs (DROPSAFE ALCOHOL PREP) 70 % PADS USE AS DIRECTED.   Blood Glucose Monitoring Suppl (TRUE METRIX METER) w/Device KIT Use as directed to test blood glucose daily   Cholecalciferol (VITAMIN D-1000 MAX ST PO) Take 2,000 Units by mouth daily.   clopidogrel  (PLAVIX ) 75 MG tablet TAKE 1 TABLET EVERY DAY   glucose blood (TRUE METRIX BLOOD GLUCOSE TEST) test strip USE AS INSTRUCTED   loperamide  (IMODIUM  A-D) 2 MG tablet Take 2-4 mg by mouth 4 (four) times daily as needed for diarrhea or loose stools.   Melatonin 10 MG CAPS Take 10 mg by mouth at bedtime.   metFORMIN   (GLUCOPHAGE -XR) 500 MG 24 hr tablet TAKE 2 TABLETS TWICE DAILY WITH MEALS   metoprolol  tartrate (LOPRESSOR ) 25 MG tablet Take 1 tablet (25 mg total) by mouth 2 (two) times daily.   NP THYROID  90 MG tablet TAKE 1 TABLET BY MOUTH EVERY DAY *NOT COVERED   Probiotic Product (ALIGN) 4 MG CAPS Take 4 mg by mouth daily.   rosuvastatin  (CRESTOR ) 40 MG tablet TAKE 1 TABLET EVERY DAY   TRUEplus Lancets 33G MISC Use as directed daily.   Zinc Sulfate (ZINC 15 PO) Take by mouth.   losartan  (COZAAR ) 100 MG tablet TAKE 1 TABLET EVERY DAY   No current facility-administered medications for this visit. (Other)   REVIEW OF SYSTEMS: ROS   Positive for: Endocrine, Cardiovascular, Eyes Negative for: Constitutional, Gastrointestinal, Neurological, Skin, Genitourinary, Musculoskeletal, HENT, Respiratory, Psychiatric, Allergic/Imm, Heme/Lymph Last edited by Resa Delon ORN, COT on 01/10/2024  8:03 AM.     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   VENTRAL HERNIA REPAIR N/A 12/28/2020   Procedure: LAPAROSCOPIC VENTRAL INCISIONAL HERNIA REPAIR WITH MESH;  Surgeon: Eletha Boas, MD;  Location: [facility];  Service: General;  Laterality: N/A;   FAMILY HISTORY Family History  Problem Relation Age of Onset   Hypertension Father    Diabetes Brother    Emphysema Mother    SOCIAL HISTORY Social History   Tobacco Use   Smoking status: Former    Current packs/day: 0.00    Average  packs/day: 0.3 packs/day for 1 year (0.3 ttl pk-yrs)    Types: Cigarettes    Start date: 06/06/1969    Quit date: 06/06/1970    Years since quitting: 53.6   Smokeless tobacco: Never  Vaping Use   Vaping status: Never Used  Substance Use Topics   Alcohol use: Never   Drug use: Never       OPHTHALMIC EXAM: Base Eye Exam     Visual Acuity (Snellen - Linear)       Right Left   Dist cc 20/30 -2 20/20 -1   Dist ph cc NI          Tonometry (Tonopen, 8:07 AM)       Right Left   Pressure 8 12         Pupils       Pupils Dark Light Shape React APD   Right PERRL 2 1 Round Brisk None   Left PERRL 2 1 Round Brisk None         Visual Fields       Left Right    Full Full         Extraocular Movement       Right Left    Full, Ortho Full, Ortho         Neuro/Psych     Oriented x3: Yes   Mood/Affect: Normal         Dilation     Both eyes: 2.5% Phenylephrine  @ 8:07 AM           Slit Lamp and Fundus Exam     Slit Lamp Exam       Right Left   Lids/Lashes Dermatochalasis - upper lid, Meibomian gland dysfunction Dermatochalasis - upper lid, Meibomian gland dysfunction   Conjunctiva/Sclera White and quiet White and quiet   Cornea arcus, trace tear film debris, well healed cataract wound arcus, trace PEE, mild tear film debris   Anterior Chamber Moderate depth, clear, very narrow temporal angles Moderate depth, clear, very narrow temporal angles   Iris Round and moderately dilated, mild anterior bowing Round and moderately dilated, mild anterior bowing   Lens PC IOL in good position 2-3+ Nuclear sclerosis with brunescence, 2-3+ Cortical cataract   Anterior Vitreous Vitreous syneresis, Posterior vitreous detachment, +vitreous condensations Vitreous syneresis, Posterior vitreous detachment, vitreous condensations         Fundus Exam       Right Left   Disc Trace pallor, Sharp rim, +cupping Pink and Sharp, +cupping   C/D Ratio 0.7 0.7   Macula Blunted  foveal reflex, central cystic changes / edema -- slightly improved, RPE mottling, fine drusen, pigment clumping, cluster of MA temporal to  fovea and temporal macula -- stably improved -- no heme, mild ERM, punctate central exudation Flat, Blunted foveal reflex, RPE mottling, no heme, no exudates, trace cystic changes nasal macula   Vessels attenuated, Tortuous attenuated, Tortuous   Periphery Attached, mild reticular degeneration, rare MA Attached, mild reticular degeneration, rare MA           Refraction     Wearing Rx       Sphere Cylinder Axis Add   Right -0.50 +0.75 166 +2.75   Left +1.75 Sphere  +2.75    Type: Bifocal           IMAGING AND PROCEDURES  Imaging and Procedures for 01/10/2024  OCT, Retina - OU - Both Eyes       Right Eye Quality was good. Central Foveal Thickness: 286. Progression has improved. Findings include no SRF, abnormal foveal contour, intraretinal hyper-reflective material, epiretinal membrane, intraretinal fluid, vitreomacular adhesion (Persistent central cyst and surrounding cystic changes -- slightly improved).   Left Eye Quality was good. Central Foveal Thickness: 263. Progression has improved. Findings include no SRF, abnormal foveal contour, intraretinal fluid, vitreous traction, vitreomacular adhesion (persistent central VMT with trace cystic changes, trace cystic changes nasal mac--slightly improved).   Notes *Images captured and stored on drive  Diagnosis / Impression:  OD: +central DME, Persistent central cyst and surrounding cystic changes -- slightly improved OS: persistent central VMT with trace cystic changes, trace cystic changes nasal mac--slightly improved  Clinical management:  See below  Abbreviations: NFP - Normal foveal profile. CME - cystoid macular edema. PED - pigment epithelial detachment. IRF - intraretinal fluid. SRF - subretinal fluid. EZ - ellipsoid zone. ERM - epiretinal membrane. ORA - outer retinal atrophy. ORT -  outer retinal tubulation. SRHM - subretinal hyper-reflective material. IRHM - intraretinal hyper-reflective material      Intravitreal Injection, Pharmacologic Agent - OD - Right Eye       Time Out 01/10/2024. 9:09 AM. Confirmed correct patient, procedure, site, and patient consented.   Anesthesia Topical anesthesia was used. Anesthetic medications included Lidocaine  2%, Proparacaine 0.5%.   Procedure Preparation included 5% betadine to ocular surface, eyelid speculum. A (32g) needle was used.   Injection: 6 mg faricimab -svoa 6 MG/0.05ML (Patient supplied)   Route: Intravitreal, Site: Right Eye   NDC: 49757-903-98, Lot: [id number], Expiration date: 03/05/2026, Waste: 0 mL   Post-op Post injection exam found visual acuity of at least counting fingers. The patient tolerated the procedure well. There were no complications. The patient received written and verbal post procedure care education. Post injection medications were not given.   Notes **SAMPLE MEDICATION ADMINISTERED**            ASSESSMENT/PLAN:    ICD-10-CM   1. Moderate nonproliferative diabetic retinopathy of both eyes with macular edema associated with type 2 diabetes mellitus (HCC)  E11.3313 OCT, Retina - OU - Both Eyes    Intravitreal Injection, Pharmacologic Agent - OD - Right Eye    faricimab -svoa (VABYSMO ) 6mg /0.37mL intravitreal injection    2. Long term (current) use of oral hypoglycemic drugs  Z79.84     3. 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 Essential hypertension  I10     5. Hypertensive retinopathy of both eyes  H35.033     6. Pseudophakia  Z96.1     7. Combined forms of age-related cataract of left eye  H25.812     8. Glaucoma suspect of both eyes  H40.003  1,2. Moderate non-proliferative diabetic retinopathy, both eyes  - A1c: 6.3 on 12.16.24  - s/p IVE OD #1 (06.22.22)--sample, #2 (09.07.22), #3 (11.14.22), #4 (12.28.22), #5 (02.08.23), IVE #6 (03.15.23), #7 (04.19.23),  #8 (05.24.23), #9 (06.28.23), #10 (07.27.23), #11 (08.24.23) -- IVE resistance  - s/p IVV OD #1 (09.21.23), #2 (10.19.23), #3 (11.16.23), #4 (12.14.23), #5 (01.18.24), #6 (02.21.24), #7 (03.27.24), #8 (05.01.24), #9 (06.05.24), #10 (07.09.24), #11 (08.14.24), #12 (09.18.24), #13 (10.23.24), #14 (11.27.24), #15 (sample -- 01.02.25), #16 (sample -- 02.05.25), #17 (sample -- 03.19.25), #18 (sample -- 04.23.25), #19 (sample--05.28.25), #20 (sample--07.02.25) - previously seen at [facility] by Dr. Phyllistine (Fax [phone]) - history of anti-VEGF injections since 2019 -- last injection IVE OD Feb/Mar 2022 -- q31mo interval - FA (11.14.22) shows OD: Focal hyperflourescence IT to fovea; no NV; OS: No NV - exam w/ scattered MA - OCT shows OD: +central DME, Persistent central cyst and surrounding cystic changes -- slightly improved; OS: persistent central VMT at 5 wks - BCVA OD stable at 20/30; OS 20/20 -- stable. - recommend IVV OD #21 (sample) today, 08.06.25 -- w/ f/u in 5 wks - pt wishes to proceed - RBA of procedure discussed, questions answered - IVV informed consent obtained and signed, 02.05.25 - see procedure note - Vabysmo  PAP pending - f/u in 5 wks  -- DFE/OCT, possible injection  3. VMA/VMT OU  - central VMA/VMT OD likely contributing to central cyst  - OS stable  - monitor  4,5. Hypertensive retinopathy OU - discussed importance of tight BP control - monitor  6. Pseudophakia OD  - s/p CE/IOL OD (02.29.24, Dr. Austin)  - IOL in good position, doing well  - monitor  7. Mixed Cataract OS - The symptoms of cataract, surgical options, and treatments and risks were discussed with patient. - discussed diagnosis and progression - under the expert management of Dr. Austin  8. Glaucoma suspect  - C/D 0.7 OU  - IOPs okay (10,13)  - under the expert management of Dr. Austin  Ophthalmic Meds Ordered this visit:  Meds ordered this encounter  Medications   faricimab -svoa  (VABYSMO ) 6mg /0.23mL intravitreal injection     Return in about 5 weeks (around 02/14/2024) for NPDR OU, DFE, OCT, Possible Injxn.  There are no Patient Instructions on file for this visit.  This document serves as a record of services personally performed by Redell JUDITHANN Hans, MD, PhD. It was created on their behalf by Almetta Pesa, an ophthalmic technician. The creation of this record is the provider's dictation and/or activities during the visit.    Electronically signed by: Almetta Pesa, OA, 01/16/24  2:47 AM  Redell JUDITHANN Hans, M.D., Ph.D. Diseases & Surgery of the Retina and Vitreous [facility]  I have reviewed the above documentation for accuracy and completeness, and I agree with the above. Redell JUDITHANN Hans, M.D., Ph.D. 01/16/24 2:53 AM   Abbreviations: M myopia (nearsighted); A astigmatism; H hyperopia (farsighted); P presbyopia; Mrx spectacle prescription;  CTL contact lenses; OD right eye; OS left eye; OU both eyes  XT exotropia; ET esotropia; PEK punctate epithelial keratitis; PEE punctate epithelial erosions; DES dry eye syndrome; MGD meibomian gland dysfunction; ATs artificial tears; PFAT's preservative free artificial tears; NSC nuclear sclerotic cataract; PSC posterior subcapsular cataract; ERM epi-retinal membrane; PVD posterior vitreous detachment; RD retinal detachment; DM diabetes mellitus; DR diabetic retinopathy; NPDR non-proliferative diabetic retinopathy; PDR proliferative diabetic retinopathy; CSME clinically significant macular edema; DME diabetic macular edema; dbh dot blot  hemorrhages; CWS cotton wool spot; POAG primary open angle glaucoma; C/D cup-to-disc ratio; HVF humphrey visual field; GVF goldmann visual field; OCT optical coherence tomography; IOP intraocular pressure; BRVO Branch retinal vein occlusion; CRVO central retinal vein occlusion; CRAO central retinal artery occlusion; BRAO branch retinal artery occlusion; RT retinal tear; SB  scleral buckle; PPV pars plana vitrectomy; VH Vitreous hemorrhage; PRP panretinal laser photocoagulation; IVK intravitreal kenalog; VMT vitreomacular traction; MH Macular hole;  NVD neovascularization of the disc; NVE neovascularization elsewhere; AREDS age related eye disease study; ARMD age related macular degeneration; POAG primary open angle glaucoma; EBMD epithelial/anterior basement membrane dystrophy; ACIOL anterior chamber intraocular lens; IOL intraocular lens; PCIOL posterior chamber intraocular lens; Phaco/IOL phacoemulsification with intraocular lens placement; PRK photorefractive keratectomy; LASIK laser assisted in situ keratomileusis; HTN hypertension; DM diabetes mellitus; COPD chronic obstructive pulmonary disease

## 2024-01-09 ENCOUNTER — Other Ambulatory Visit: Payer: Self-pay | Admitting: Vascular Surgery

## 2024-01-09 DIAGNOSIS — I739 Peripheral vascular disease, unspecified: Secondary | ICD-10-CM

## 2024-01-10 ENCOUNTER — Ambulatory Visit (INDEPENDENT_AMBULATORY_CARE_PROVIDER_SITE_OTHER): Admitting: Ophthalmology

## 2024-01-10 ENCOUNTER — Encounter (INDEPENDENT_AMBULATORY_CARE_PROVIDER_SITE_OTHER): Payer: Self-pay | Admitting: Ophthalmology

## 2024-01-10 DIAGNOSIS — Z961 Presence of intraocular lens: Secondary | ICD-10-CM

## 2024-01-10 DIAGNOSIS — H43823 Vitreomacular adhesion, bilateral: Secondary | ICD-10-CM

## 2024-01-10 DIAGNOSIS — E113313 Type 2 diabetes mellitus with moderate nonproliferative diabetic retinopathy with macular edema, bilateral: Secondary | ICD-10-CM

## 2024-01-10 DIAGNOSIS — I10 Essential (primary) hypertension: Secondary | ICD-10-CM

## 2024-01-10 DIAGNOSIS — H40003 Preglaucoma, unspecified, bilateral: Secondary | ICD-10-CM

## 2024-01-10 DIAGNOSIS — Z7984 Long term (current) use of oral hypoglycemic drugs: Secondary | ICD-10-CM

## 2024-01-10 DIAGNOSIS — H25812 Combined forms of age-related cataract, left eye: Secondary | ICD-10-CM

## 2024-01-10 DIAGNOSIS — H35033 Hypertensive retinopathy, bilateral: Secondary | ICD-10-CM | POA: Diagnosis not present

## 2024-01-10 MED ORDER — FARICIMAB-SVOA 6 MG/0.05ML IZ SOLN
6.0000 mg | INTRAVITREAL | Status: AC | PRN
  Administered 2024-01-10: 6 mg via INTRAVITREAL

## 2024-02-07 NOTE — Progress Notes (Signed)
 [facility] - Clinic Note  02/14/2024     CHIEF COMPLAINT Patient presents for Retina Follow Up  HISTORY OF PRESENT ILLNESS: Erin Mitchell is a 78 y.o. female who presents to the clinic today for:   HPI     Retina Follow Up   Patient presents with  Diabetic Retinopathy.  In both eyes.  This started 5 weeks ago.  I, the attending physician,  performed the HPI with the patient and updated documentation appropriately.        Comments   Patient here for 5 weeks retina follow up for NPDR OU. Patient states vision doing ok. No eye pain. Added supplements Alpha Lipid acid and magnesium.       Last edited by Valdemar Rogue, MD on 02/14/2024 12:35 PM.      Pt states feels the vision is a little clearer when reading.   Referring physician: Alvia Bring, DO [address]  [address] [place],  [place] [address]  HISTORICAL INFORMATION:   Selected notes from the MEDICAL RECORD NUMBER Referred by Dr. Bring Alvia for DM exam LEE:  Ocular Hx- PMH- DM, HTN, afib, kidney disease, last a1c was 6.8 on 05.05.22    CURRENT MEDICATIONS: No current outpatient medications on file. (Ophthalmic Drugs)   No current facility-administered medications for this visit. (Ophthalmic Drugs)   Current Outpatient Medications (Other)  Medication Sig   Alcohol Swabs (DROPSAFE ALCOHOL PREP) 70 % PADS USE AS DIRECTED.   Alpha Lipoic Acid 200 MG CAPS Take 200 mg by mouth daily.   Blood Glucose Monitoring Suppl (TRUE METRIX METER) w/Device KIT Use as directed to test blood glucose daily   Cholecalciferol (VITAMIN D-1000 MAX ST PO) Take 2,000 Units by mouth daily.   clopidogrel  (PLAVIX ) 75 MG tablet TAKE 1 TABLET EVERY DAY   glucose blood (TRUE METRIX BLOOD GLUCOSE TEST) test strip USE AS INSTRUCTED   loperamide  (IMODIUM  A-D) 2 MG tablet Take 2-4 mg by mouth 4 (four) times daily as needed for diarrhea or loose stools.   losartan  (COZAAR ) 100 MG tablet TAKE 1 TABLET  EVERY DAY   magnesium oxide (MAG-OX) 400 (240 Mg) MG tablet Take 200 mg by mouth daily.   Melatonin 10 MG CAPS Take 10 mg by mouth at bedtime.   metFORMIN  (GLUCOPHAGE -XR) 500 MG 24 hr tablet TAKE 2 TABLETS TWICE DAILY WITH MEALS   metoprolol  tartrate (LOPRESSOR ) 25 MG tablet Take 1 tablet (25 mg total) by mouth 2 (two) times daily.   NP THYROID  90 MG tablet TAKE 1 TABLET BY MOUTH EVERY DAY *NOT COVERED   Probiotic Product (ALIGN) 4 MG CAPS Take 4 mg by mouth daily.   rosuvastatin  (CRESTOR ) 40 MG tablet TAKE 1 TABLET EVERY DAY   TRUEplus Lancets 33G MISC Use as directed daily.   Zinc Sulfate (ZINC 15 PO) Take by mouth.   No current facility-administered medications for this visit. (Other)   REVIEW OF SYSTEMS: ROS   Positive for: Endocrine, Cardiovascular, Eyes Negative for: Constitutional, Gastrointestinal, Neurological, Skin, Genitourinary, Musculoskeletal, HENT, Respiratory, Psychiatric, Allergic/Imm, Heme/Lymph Last edited by Orval Asberry RAMAN, COA on 02/14/2024  8:07 AM.      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 & 3rd   VENTRAL HERNIA REPAIR N/A 12/28/2020   Procedure: LAPAROSCOPIC VENTRAL INCISIONAL HERNIA REPAIR WITH MESH;  Surgeon: Eletha Boas, MD;  Location: [facility];  Service: General;  Laterality: N/A;   FAMILY HISTORY Family History  Problem Relation Age of Onset   Hypertension Father    Diabetes Brother    Emphysema Mother    SOCIAL  HISTORY Social History   Tobacco Use   Smoking status: Former    Current packs/day: 0.00    Average packs/day: 0.3 packs/day for 1 year (0.3 ttl pk-yrs)    Types: Cigarettes    Start date: 06/06/1969    Quit date: 06/06/1970    Years since quitting: 53.7   Smokeless tobacco: Never  Vaping Use   Vaping status: Never Used  Substance Use Topics   Alcohol use: Never   Drug use: Never       OPHTHALMIC EXAM: Base Eye Exam     Visual Acuity (Snellen - Linear)       Right Left   Dist cc 20/40 20/20   Dist ph cc 20/40 +2     Correction: Glasses         Tonometry (Tonopen, 8:05 AM)       Right Left   Pressure 12 11         Pupils       Dark Light Shape React APD   Right 2 1 Round Brisk None   Left 2 1 Round Brisk None         Visual Fields (Counting fingers)       Left Right    Full Full         Extraocular Movement       Right Left    Full, Ortho Full, Ortho         Neuro/Psych     Oriented x3: Yes   Mood/Affect: Normal         Dilation     Both eyes: 1.0% Mydriacyl, 2.5% Phenylephrine  @ 8:05 AM           Slit Lamp and Fundus Exam     Slit Lamp Exam       Right Left   Lids/Lashes Dermatochalasis - upper lid, Meibomian gland dysfunction Dermatochalasis - upper lid, Meibomian gland dysfunction   Conjunctiva/Sclera White and quiet White and quiet   Cornea arcus, trace tear film debris, well healed cataract wound arcus, trace PEE, mild tear film debris   Anterior Chamber Moderate depth, clear, very narrow temporal angles Moderate depth, clear, very narrow temporal angles   Iris Round and moderately dilated, mild anterior bowing Round and moderately dilated, mild anterior bowing   Lens PC IOL in good position 2-3+ Nuclear sclerosis with brunescence, 2-3+ Cortical cataract   Anterior Vitreous Vitreous syneresis, Posterior vitreous detachment, +vitreous condensations Vitreous syneresis, Posterior vitreous detachment, vitreous condensations          Fundus Exam       Right Left   Disc Trace pallor, Sharp rim, +cupping Pink and Sharp, +cupping   C/D Ratio 0.7 0.7   Macula Blunted  foveal reflex, central cystic changes / edema -- slightly improved, RPE mottling, fine drusen, pigment clumping, cluster of MA temporal to fovea and temporal macula -- stably improved -- no heme, mild ERM, punctate central exudation Flat, Blunted foveal reflex, RPE mottling, no heme, no exudates, trace cystic changes nasal macula   Vessels attenuated, Tortuous attenuated, Tortuous   Periphery Attached, mild reticular degeneration, rare MA Attached, mild reticular degeneration, rare MA           Refraction     Wearing Rx       Sphere Cylinder Axis Add   Right -0.50 +0.75 166 +2.75   Left +1.75 Sphere  +2.75    Type: Bifocal           IMAGING AND PROCEDURES  Imaging and Procedures for 02/14/2024  OCT, Retina - OU - Both Eyes       Right Eye Quality was good. Central Foveal Thickness: 285. Progression has improved. Findings include no SRF, abnormal foveal contour, intraretinal hyper-reflective material, epiretinal membrane, intraretinal fluid, vitreomacular adhesion (Persistent central cyst and surrounding cystic changes -- slightly improved).   Left Eye Quality was good. Central Foveal Thickness: 266. Progression has improved. Findings include no SRF, abnormal foveal contour, intraretinal fluid, vitreous traction, vitreomacular adhesion (persistent central VMT with trace cystic changes, trace cystic changes nasal mac--slightly improved).   Notes *Images captured and stored on drive  Diagnosis / Impression:  OD: +central DME, Persistent central cyst and surrounding cystic changes -- slightly improved OS: persistent central VMT with trace cystic changes, trace cystic changes nasal mac--slightly improved  Clinical management:  See below  Abbreviations: NFP - Normal foveal profile. CME - cystoid macular edema. PED - pigment  epithelial detachment. IRF - intraretinal fluid. SRF - subretinal fluid. EZ - ellipsoid zone. ERM - epiretinal membrane. ORA - outer retinal atrophy. ORT - outer retinal tubulation. SRHM - subretinal hyper-reflective material. IRHM - intraretinal hyper-reflective material      Intravitreal Injection, Pharmacologic Agent - OD - Right Eye       Time Out 02/14/2024. 8:15 AM. Confirmed correct patient, procedure, site, and patient consented.   Anesthesia Topical anesthesia was used. Anesthetic medications included Lidocaine  2%, Proparacaine 0.5%.   Procedure Preparation included 5% betadine to ocular surface, eyelid speculum. A (32g) needle was used.   Injection: 6 mg faricimab -svoa 6 MG/0.05ML (Patient supplied)   Route: Intravitreal, Site: Right Eye   NDC: 49757-903-98, Lot: [id number], Expiration date: 03/05/2026, Waste: 0 mL   Post-op Post injection exam found visual acuity of at least counting fingers. The patient tolerated the procedure well. There were no complications. The patient received written and verbal post procedure care education. Post injection medications were not given.   Notes **SAMPLE MEDICATION ADMINISTERED**            ASSESSMENT/PLAN:    ICD-10-CM   1. Moderate nonproliferative diabetic retinopathy of both eyes with macular edema associated with type 2 diabetes mellitus (HCC)  E11.3313 OCT, Retina - OU - Both Eyes    Intravitreal Injection, Pharmacologic Agent - OD - Right Eye    faricimab -svoa (VABYSMO ) 6mg /0.92mL intravitreal injection    2. Long term (current) use of oral hypoglycemic drugs  Z79.84     3. 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 Essential hypertension  I10     5. Hypertensive retinopathy of both eyes  H35.033     6. Pseudophakia  Z96.1     7. Combined forms of age-related cataract  of left eye  H25.812     8. Glaucoma suspect of both eyes  H40.003      1,2. Moderate non-proliferative diabetic retinopathy, both  eyes  - A1c: 6.4 (05.30.25), 6.3 (12.16.24) - s/p IVE OD #1 (06.22.22)--sample, #2 (09.07.22), #3 (11.14.22), #4 (12.28.22), #5 (02.08.23), IVE #6 (03.15.23), #7 (04.19.23), #8 (05.24.23), #9 (06.28.23), #10 (07.27.23), #11 (08.24.23) -- IVE resistance =================== - s/p IVV OD #1 (09.21.23), #2 (10.19.23), #3 (11.16.23), #4 (12.14.23), #5 (01.18.24), #6 (02.21.24), #7 (03.27.24), #8 (05.01.24), #9 (06.05.24), #10 (07.09.24), #11 (08.14.24), #12 (09.18.24), #13 (10.23.24), #14 (11.27.24), #15 (sample -- 01.02.25), #16 (sample -- 02.05.25), #17 (sample -- 03.19.25), #18 (sample -- 04.23.25), #19 (sample--05.28.25), #20 (sample--07.02.25), #21 (sample--08.06.25) - previously seen at [facility] by Dr. Phyllistine (Fax [phone]) - history of anti-VEGF injections since 2019 -- last injection IVE OD Feb/Mar 2022 -- q40mo interval - FA (11.14.22) shows OD: Focal hyperflourescence IT to fovea; no NV; OS: No NV - exam w/ scattered MA - OCT shows OD: +central DME, Persistent central cyst and surrounding cystic changes -- slightly improved; OS: persistent central VMT with trace cystic changes, trace cystic changes nasal mac--slightly improved at 5 wks - BCVA OD 20/40 from 20/30; OS 20/20 -- stable. - recommend IVV OD #22 (sample) today, 09.10.25 -- w/ f/u in 5 wks - pt wishes to proceed - RBA of procedure discussed, questions answered - IVV informed consent obtained and signed, 02.05.25 - see procedure note - Vabysmo  PAP approved, but medication not yet received - f/u in 5 wks  -- DFE/OCT, possible injection  3. VMA/VMT OU  - central VMA/VMT OD likely contributing to central cyst  - OS stable  - monitor  4,5. Hypertensive retinopathy OU - discussed importance of tight BP control - monitor  6. Pseudophakia OD  - s/p CE/IOL OD (02.29.24, Dr. Austin)  - IOL in good position, doing well  - monitor  7. Mixed Cataract OS - The symptoms of cataract, surgical options, and treatments  and risks were discussed with patient. - discussed diagnosis and progression - under the expert management of Dr. Austin  8. Glaucoma suspect  - C/D 0.7 OU  - IOPs okay (10,13)  - under the expert management of Dr. Austin  Ophthalmic Meds Ordered this visit:  Meds ordered this encounter  Medications   faricimab -svoa (VABYSMO ) 6mg /0.62mL intravitreal injection     Return in about 5 weeks (around 03/20/2024) for f/u, NPDR, DFE, OCT, Possible, IVV PAP, OD.  There are no Patient Instructions on file for this visit.  This document serves as a record of services personally performed by Redell JUDITHANN Hans, MD, PhD. It was created on their behalf by Almetta Pesa, an ophthalmic technician. The creation of this record is the provider's dictation and/or activities during the visit.    Electronically signed by: Almetta Pesa, OA, 02/22/24  1:06 PM  This document serves as a record of services personally performed by Redell JUDITHANN Hans, MD, PhD. It was created on their behalf by Wanda GEANNIE Keens, COT an ophthalmic technician. The creation of this record is the provider's dictation and/or activities during the visit.    Electronically signed by:  Wanda GEANNIE Keens, COT  02/22/24 1:06 PM  Redell JUDITHANN Hans, M.D., Ph.D. Diseases & Surgery of the Retina and Vitreous [facility]  I have reviewed the above documentation for accuracy and completeness, and I agree with the above. Redell JUDITHANN Hans, M.D., Ph.D. 02/22/24 1:07 PM  Abbreviations: M myopia (nearsighted); A astigmatism; H hyperopia (farsighted); P presbyopia; Mrx spectacle prescription;  CTL contact lenses; OD right eye; OS left eye; OU both eyes  XT exotropia; ET esotropia; PEK punctate epithelial keratitis; PEE punctate epithelial erosions; DES dry eye syndrome; MGD meibomian gland dysfunction; ATs artificial tears; PFAT's preservative free artificial tears; NSC nuclear sclerotic cataract; PSC posterior subcapsular  cataract; ERM epi-retinal membrane; PVD posterior vitreous detachment; RD retinal detachment; DM diabetes mellitus; DR diabetic retinopathy; NPDR non-proliferative diabetic retinopathy; PDR proliferative diabetic retinopathy; CSME clinically significant macular edema; DME diabetic macular edema; dbh dot blot hemorrhages; CWS cotton wool spot; POAG primary open angle glaucoma; C/D cup-to-disc ratio; HVF humphrey visual field; GVF goldmann visual field; OCT optical coherence tomography; IOP intraocular pressure; BRVO Branch retinal vein occlusion; CRVO central retinal vein occlusion; CRAO central retinal artery occlusion; BRAO branch retinal artery occlusion; RT retinal tear; SB scleral buckle; PPV pars plana vitrectomy; VH Vitreous hemorrhage; PRP panretinal laser photocoagulation; IVK intravitreal kenalog; VMT vitreomacular traction; MH Macular hole;  NVD neovascularization of the disc; NVE neovascularization elsewhere; AREDS age related eye disease study; ARMD age related macular degeneration; POAG primary open angle glaucoma; EBMD epithelial/anterior basement membrane dystrophy; ACIOL anterior chamber intraocular lens; IOL intraocular lens; PCIOL posterior chamber intraocular lens; Phaco/IOL phacoemulsification with intraocular lens placement; PRK photorefractive keratectomy; LASIK laser assisted in situ keratomileusis; HTN hypertension; DM diabetes mellitus; COPD chronic obstructive pulmonary disease

## 2024-02-09 ENCOUNTER — Ambulatory Visit: Admitting: Podiatry

## 2024-02-09 ENCOUNTER — Encounter: Payer: Self-pay | Admitting: Podiatry

## 2024-02-09 DIAGNOSIS — M79675 Pain in left toe(s): Secondary | ICD-10-CM | POA: Diagnosis not present

## 2024-02-09 DIAGNOSIS — E1152 Type 2 diabetes mellitus with diabetic peripheral angiopathy with gangrene: Secondary | ICD-10-CM | POA: Diagnosis not present

## 2024-02-09 DIAGNOSIS — B351 Tinea unguium: Secondary | ICD-10-CM | POA: Diagnosis not present

## 2024-02-09 DIAGNOSIS — M79674 Pain in right toe(s): Secondary | ICD-10-CM

## 2024-02-09 DIAGNOSIS — I739 Peripheral vascular disease, unspecified: Secondary | ICD-10-CM | POA: Diagnosis not present

## 2024-02-09 NOTE — Progress Notes (Signed)
  Subjective:  Patient ID: Erin Mitchell, female    DOB: [birth year],   MRN: [id number]  Chief Complaint  Patient presents with   Diabetes    Just a follow-up, nail clip and check my feet.  I'd like to get a new pair of shoes.  Saw Dr. Velma Ku - 11/03/2023; A1c - 6.4    78 y.o. female presents for concern of thickened elongated and painful nails that are difficult to trim. Requesting to have them trimmed today. Relates burning and tingling in their feet. Patient is diabetic and last A1c was  Lab Results  Component Value Date   HGBA1C 6.4 (A) 11/03/2023   .   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 Also has a history of PAD . Denies any other pedal complaints. Denies n/v/f/c.   PCP: Velma Ku MD   Past Medical History:  Diagnosis Date   Atrial fibrillation (HCC)    on Plavix    Cataract    Diabetes (HCC)    Diabetic retinopathy (HCC)    Exposure to Agent Orange    Hypertension    Hypertensive retinopathy    hypothyroidism    Hypothyroidism    Kidney disease     Objective:  Physical Exam: Vascular: DP/PT pulses 2/4 bilateral. CFT <3 seconds. Absent hair growth on digits. No edema.  Skin. No lacerations or abrasions bilateral feet. Nails 1-5 are thickened discolored and elongated with subungual debris.  Right great toe hhealed well no open areas.  Musculoskeletal: MMT 5/5 bilateral lower extremities in DF, PF, Inversion and Eversion. Deceased ROM in DF of ankle joint. Right second and third digits amputated. Severe bunion of right foot.  Neurological: Sensation intact to light touch.   Assessment:   1. Dermatophytosis of nail   2. Pain in toes of both feet   3. PAD (peripheral artery disease) (HCC)   4. Type 2 diabetes mellitus with diabetic peripheral angiopathy and gangrene, without long-term current use of insulin  (HCC)            Plan:  Patient was evaluated and treated and all  questions answered. -Discussed and educated patient on diabetic foot care, especially with  regards to the vascular, neurological and musculoskeletal systems.  -Stressed the importance of good glycemic control and the detriment of not  controlling glucose levels in relation to the foot. -Discussed supportive shoes at all times and checking feet regularly.  -Mechanically debrided all nails 1-5 bilateral using sterile nail nipper and filed with dremel without incident  -Answered all patient questions -Patient to return  in 3 months for at risk foot care -Patient advised to call the office if any problems or questions arise in the meantime.    Asberry Failing, DPM

## 2024-02-12 NOTE — Progress Notes (Unsigned)
 HISTORY AND PHYSICAL     CC:  follow up. Requesting Provider:  Alvia Bring, DO  HPI: This is a 78 y.o. female who is here today for follow up for PAD.  Pt has hx of angioplasty and stenting of the tibioperoneal trunk reportedly in [place]  around 2021.  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   Pt was last seen 12/01/2022 and at that time, she was not having any claudication, rest pain or tissue loss and was walking to the mailbox every day.  She quit smoking in 1972.   The pt returns today for follow up.  She states she is doing well.  She denies any rest pain or non healing wounds or claudication.  She is compliant with her plavix  and statin.  She states her daughter is going to have surgery for dialysis access and her son is going to have open heart surgery later this month.    Her husband has hx of AAA repair at the [facility] in [place] .   The pt is on a statin for cholesterol management.    The pt is not on an aspirin.    Other AC:  Plavix  The pt is on ARB, BB for hypertension.  The pt is  on diabetic medication. Tobacco hx:  former  Pt does not have family hx of AAA.  Past Medical History:  Diagnosis Date   Atrial fibrillation (HCC)    on Plavix    Cataract    Diabetes (HCC)    Diabetic retinopathy (HCC)    Exposure to Agent Orange    Hypertension    Hypertensive retinopathy    hypothyroidism    Hypothyroidism    Kidney disease     Past Surgical History:  Procedure Laterality Date   APPENDECTOMY     CESAREAN SECTION     CHOLECYSTECTOMY     HERNIA REPAIR     TOE AMPUTATION Right    2nd & 3rd   VENTRAL HERNIA REPAIR N/A 12/28/2020   Procedure: LAPAROSCOPIC VENTRAL INCISIONAL HERNIA REPAIR WITH MESH;  Surgeon: Eletha Boas, MD;  Location: [facility];  Service: General;  Laterality: N/A;    Allergies  Allergen Reactions   Farxiga  [Dapagliflozin ] Other (See Comments)    Yeast infections     Current Outpatient Medications  Medication  Sig Dispense Refill   Alcohol Swabs (DROPSAFE ALCOHOL PREP) 70 % PADS USE AS DIRECTED. 300 each 3   Blood Glucose Monitoring Suppl (TRUE METRIX METER) w/Device KIT Use as directed to test blood glucose daily 1 kit 0   Cholecalciferol (VITAMIN D-1000 MAX ST PO) Take 2,000 Units by mouth daily.     clopidogrel  (PLAVIX ) 75 MG tablet TAKE 1 TABLET EVERY DAY 90 tablet 3   glucose blood (TRUE METRIX BLOOD GLUCOSE TEST) test strip USE AS INSTRUCTED 300 strip 3   loperamide  (IMODIUM  A-D) 2 MG tablet Take 2-4 mg by mouth 4 (four) times daily as needed for diarrhea or loose stools.     losartan  (COZAAR ) 100 MG tablet TAKE 1 TABLET EVERY DAY 90 tablet 3   Melatonin 10 MG CAPS Take 10 mg by mouth at bedtime.     metFORMIN  (GLUCOPHAGE -XR) 500 MG 24 hr tablet TAKE 2 TABLETS TWICE DAILY WITH MEALS 360 tablet 3   metoprolol  tartrate (LOPRESSOR ) 25 MG tablet Take 1 tablet (25 mg total) by mouth 2 (two) times daily. 180 tablet 3   NP THYROID  90 MG tablet TAKE 1 TABLET BY MOUTH EVERY  DAY *NOT COVERED 90 tablet 1   Probiotic Product (ALIGN) 4 MG CAPS Take 4 mg by mouth daily.     rosuvastatin  (CRESTOR ) 40 MG tablet TAKE 1 TABLET EVERY DAY 90 tablet 3   TRUEplus Lancets 33G MISC Use as directed daily. 100 each 3   Zinc Sulfate (ZINC 15 PO) Take by mouth.     No current facility-administered medications for this visit.    Family History  Problem Relation Age of Onset   Hypertension Father    Diabetes Brother    Emphysema Mother     Social History   Socioeconomic History   Marital status: Married    Spouse name: Not on file   Number of children: 7   Years of education: Not on file   Highest education level: Not on file  Occupational History   Occupation: Retired  Tobacco Use   Smoking status: Former    Current packs/day: 0.00    Average packs/day: 0.3 packs/day for 1 year (0.3 ttl pk-yrs)    Types: Cigarettes    Start date: 06/06/1969    Quit date: 06/06/1970    Years since quitting: 53.7    Smokeless tobacco: Never  Vaping Use   Vaping status: Never Used  Substance and Sexual Activity   Alcohol use: Never   Drug use: Never   Sexual activity: Not Currently    Partners: Male  Other Topics Concern   Not on file  Social History Narrative   Not on file   Social Drivers of Health   Financial Resource Strain: Not on file  Food Insecurity: Not on file  Transportation Needs: Not on file  Physical Activity: Not on file  Stress: Not on file  Social Connections: Not on file  Intimate Partner Violence: Not on file     REVIEW OF SYSTEMS:   [X]  denotes positive finding, [ ]  denotes negative finding Cardiac  Comments:  Chest pain or chest pressure:    Shortness of breath upon exertion:    Short of breath when lying flat:    Irregular heart rhythm:        Vascular    Pain in calf, thigh, or hip brought on by ambulation:    Pain in feet at night that wakes you up from your sleep:     Blood clot in your veins:    Leg swelling:         Pulmonary    Oxygen at home:    Productive cough:     Wheezing:         Neurologic    Sudden weakness in arms or legs:     Sudden numbness in arms or legs:     Sudden onset of difficulty speaking or slurred speech:    Temporary loss of vision in one eye:     Problems with dizziness:         Gastrointestinal    Blood in stool:     Vomited blood:         Genitourinary    Burning when urinating:     Blood in urine:        Psychiatric    Major depression:         Hematologic    Bleeding problems:    Problems with blood clotting too easily:        Skin    Rashes or ulcers:        Constitutional    Fever or chills:  PHYSICAL EXAMINATION:  Today's Vitals   02/13/24 1333 02/13/24 1336  BP: 135/84   Pulse: 75   Resp: 18   Temp: 98.2 F (36.8 C)   TempSrc: Temporal   Weight: 162 lb 6.4 oz (73.7 kg)   Height: 5' 4 (1.626 m)   PainSc: 4  5   PainLoc: Foot    Body mass index is 27.88 kg/m.   General:  WDWN  in NAD; vital signs documented above Gait: Not observed HENT: WNL, normocephalic Pulmonary: normal non-labored breathing , without wheezing Cardiac: regular HR, without carotid bruits Abdomen: soft, NT; aortic pulse is not palpable Skin: without rashes Vascular Exam/Pulses: Brisk doppler flow bilateral DP/PT; palpable radial pulses bilaterally Extremities: without ischemic changes, without Gangrene , without cellulitis; without open wounds Musculoskeletal: no muscle wasting or atrophy  Neurologic: A&O X 3 Psychiatric:  The pt has Normal affect.   Non-Invasive Vascular Imaging:   ABI's/TBI's on 02/13/2024: Right:  [place]/0.87 - Great toe pressure: 148 Left:  [place]/0.71 - Great toe pressure: 122  Arterial duplex on 02/13/2024: Summary:  Right: Widely patent right lower extremity arterial system.  Unable to identify previously documented tibioperonal trunk stent. This appears to be vessel calcification vs stent.  Proximal posterior tibial artery velocity of 255 cm/s is noted   Previous ABI's/TBI's on 12/01/2022: Right:  1.16/0.99 - Great toe pressure: 181 Left:  [place]/0.71 - Great toe pressure:  129  Previous arterial duplex on 12/01/2022: Summary:  Right: Patent stent with no evidence of stenosis in the tibioperoneal  trunk.     ASSESSMENT/PLAN:: 78 y.o. female here for follow up for PAD with hx of angioplasty and stenting of the tibioperoneal trunk reportedly in [place]  around 2021.   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    -pt without rest pain, tissue loss or claudication.  She did have a mild elevated velocity in the proximal right PTA possibly at the stent.  Given she is asymptomatic, will have her return in 6 months to repeat duplex.   -continue plavix /statin -discussed importance of increased walking daily -discussed that since her husband has hx of AAA, at some point her children would need to be evaluated since there is a familial component.   She expressed good understanding.    Lucie Apt, [facility] [phone]  Clinic MD:   Gretta

## 2024-02-13 ENCOUNTER — Encounter: Payer: Self-pay | Admitting: Physician Assistant

## 2024-02-13 ENCOUNTER — Ambulatory Visit (HOSPITAL_BASED_OUTPATIENT_CLINIC_OR_DEPARTMENT_OTHER)
Admission: RE | Admit: 2024-02-13 | Discharge: 2024-02-13 | Discharge status: Home / Self Care | Source: Ambulatory Visit | Attending: Vascular Surgery

## 2024-02-13 ENCOUNTER — Ambulatory Visit (HOSPITAL_COMMUNITY)
Admission: RE | Admit: 2024-02-13 | Discharge: 2024-02-13 | Disposition: A | Discharge status: Home / Self Care | Source: Ambulatory Visit | Attending: Vascular Surgery | Admitting: Vascular Surgery

## 2024-02-13 ENCOUNTER — Ambulatory Visit: Attending: Vascular Surgery | Admitting: Physician Assistant

## 2024-02-13 VITALS — BP 135/84 | HR 75 | Temp 98.2°F | Resp 18 | Ht 64.0 in | Wt 162.4 lb

## 2024-02-13 DIAGNOSIS — I739 Peripheral vascular disease, unspecified: Secondary | ICD-10-CM

## 2024-02-13 LAB — VAS US ABI WITH/WO TBI

## 2024-02-14 ENCOUNTER — Encounter (INDEPENDENT_AMBULATORY_CARE_PROVIDER_SITE_OTHER): Payer: Self-pay | Admitting: Ophthalmology

## 2024-02-14 ENCOUNTER — Ambulatory Visit (INDEPENDENT_AMBULATORY_CARE_PROVIDER_SITE_OTHER): Admitting: Ophthalmology

## 2024-02-14 ENCOUNTER — Other Ambulatory Visit: Payer: Self-pay

## 2024-02-14 DIAGNOSIS — H25812 Combined forms of age-related cataract, left eye: Secondary | ICD-10-CM | POA: Diagnosis not present

## 2024-02-14 DIAGNOSIS — I10 Essential (primary) hypertension: Secondary | ICD-10-CM | POA: Diagnosis not present

## 2024-02-14 DIAGNOSIS — Z7984 Long term (current) use of oral hypoglycemic drugs: Secondary | ICD-10-CM | POA: Diagnosis not present

## 2024-02-14 DIAGNOSIS — E113313 Type 2 diabetes mellitus with moderate nonproliferative diabetic retinopathy with macular edema, bilateral: Secondary | ICD-10-CM

## 2024-02-14 DIAGNOSIS — I739 Peripheral vascular disease, unspecified: Secondary | ICD-10-CM

## 2024-02-14 DIAGNOSIS — Z961 Presence of intraocular lens: Secondary | ICD-10-CM | POA: Diagnosis not present

## 2024-02-14 DIAGNOSIS — H43823 Vitreomacular adhesion, bilateral: Secondary | ICD-10-CM | POA: Diagnosis not present

## 2024-02-14 DIAGNOSIS — H40003 Preglaucoma, unspecified, bilateral: Secondary | ICD-10-CM

## 2024-02-14 DIAGNOSIS — H35033 Hypertensive retinopathy, bilateral: Secondary | ICD-10-CM | POA: Diagnosis not present

## 2024-02-14 MED ORDER — FARICIMAB-SVOA 6 MG/0.05ML IZ SOLN
6.0000 mg | INTRAVITREAL | Status: AC | PRN
  Administered 2024-02-14: 6 mg via INTRAVITREAL

## 2024-03-08 NOTE — Progress Notes (Signed)
 Kenniya Westrich                                          MRN: [id number]   03/08/2024   The [facility] Quality Team Specialist reviewed this patient medical record for the purposes of chart review for care gap closure. The following were reviewed: chart review for care gap closure-kidney health evaluation for diabetes:eGFR  and uACR.    [facility] Quality Team

## 2024-03-12 NOTE — Progress Notes (Signed)
 [facility] - Clinic Note  03/20/2024     CHIEF COMPLAINT Patient presents for Retina Follow Up  HISTORY OF PRESENT ILLNESS: Erin Mitchell is a 78 y.o. female who presents to the clinic today for:   HPI     Retina Follow Up   Patient presents with  Diabetic Retinopathy.  In both eyes.  This started 3 years ago.  Severity is moderate.  Duration of 5 weeks.  Since onset it is gradually improving.  I, the attending physician,  performed the HPI with the patient and updated documentation appropriately.        Comments   Pt states no changes in vision. Pt has noticed a new floater OD, looks like a stick with a ball connected on the end, moving and not noticeable all the time. Pt uses Ats PRN OU. OD seems to be helping more with her reading.       Last edited by Valdemar Rogue, MD on 03/20/2024 11:51 PM.    Pt states feels the vision is improving, reading is becoming easier.    Referring physician: Alvia Bring, DO [address]  [address] [place],  [place] [address]  HISTORICAL INFORMATION:   Selected notes from the MEDICAL RECORD NUMBER Referred by Dr. Bring Alvia for DM exam LEE:  Ocular Hx- PMH- DM, HTN, afib, kidney disease, last a1c was 6.8 on 05.05.22    CURRENT MEDICATIONS: No current outpatient medications on file. (Ophthalmic Drugs)   No current facility-administered medications for this visit. (Ophthalmic Drugs)   Current Outpatient Medications (Other)  Medication Sig   Alcohol Swabs (DROPSAFE ALCOHOL PREP) 70 % PADS USE AS DIRECTED.   Alpha Lipoic Acid 200 MG CAPS Take 200 mg by mouth daily.   Blood Glucose Monitoring Suppl (TRUE METRIX METER) w/Device KIT Use as directed to test blood glucose daily   Cholecalciferol (VITAMIN D-1000 MAX ST PO) Take 2,000 Units by mouth daily.   clopidogrel  (PLAVIX ) 75 MG tablet TAKE 1 TABLET EVERY DAY   glucose blood (TRUE METRIX BLOOD GLUCOSE TEST) test strip USE AS INSTRUCTED    loperamide  (IMODIUM  A-D) 2 MG tablet Take 2-4 mg by mouth 4 (four) times daily as needed for diarrhea or loose stools.   losartan  (COZAAR ) 100 MG tablet TAKE 1 TABLET EVERY DAY   magnesium oxide (MAG-OX) 400 (240 Mg) MG tablet Take 200 mg by mouth daily.   Melatonin 10 MG CAPS Take 10 mg by mouth at bedtime.   metFORMIN  (GLUCOPHAGE -XR) 500 MG 24 hr tablet TAKE 2 TABLETS TWICE DAILY WITH MEALS   metoprolol  tartrate (LOPRESSOR ) 25 MG tablet Take 1 tablet (25 mg total) by mouth 2 (two) times daily.   NP THYROID  90 MG tablet TAKE 1 TABLET BY MOUTH EVERY DAY *NOT COVERED   Probiotic Product (ALIGN) 4 MG CAPS Take 4 mg by mouth daily.   rosuvastatin  (CRESTOR ) 40 MG tablet TAKE 1 TABLET EVERY DAY   TRUEplus Lancets 33G MISC Use as directed daily.   Zinc Sulfate (ZINC 15 PO) Take by mouth.   No current facility-administered medications for this visit. (Other)   REVIEW OF SYSTEMS: ROS   Positive for: Endocrine, Cardiovascular, Eyes Negative for: Constitutional, Gastrointestinal, Neurological, Skin, Genitourinary, Musculoskeletal, HENT, Respiratory, Psychiatric, Allergic/Imm, Heme/Lymph Last edited by Elnor Avelina RAMAN, COT on 03/20/2024  9:08 AM.       ALLERGIES Allergies  Allergen Reactions   Farxiga  [Dapagliflozin ] Other (See Comments)    Yeast infections  PAST MEDICAL HISTORY Past Medical History:  Diagnosis Date   Atrial fibrillation (HCC)    on Plavix    Cataract    Diabetes (HCC)    Diabetic retinopathy (HCC)    Exposure to Agent Orange    Hypertension    Hypertensive retinopathy    hypothyroidism    Hypothyroidism    Kidney disease    Past Surgical History:  Procedure Laterality Date   APPENDECTOMY     CESAREAN SECTION     CHOLECYSTECTOMY     HERNIA REPAIR     TOE AMPUTATION Right    2nd & 3rd   VENTRAL HERNIA REPAIR N/A 12/28/2020   Procedure: LAPAROSCOPIC VENTRAL INCISIONAL HERNIA REPAIR WITH MESH;  Surgeon: Eletha Boas, MD;  Location: [facility];  Service: General;   Laterality: N/A;   FAMILY HISTORY Family History  Problem Relation Age of Onset   Hypertension Father    Diabetes Brother    Emphysema Mother    SOCIAL HISTORY Social History   Tobacco Use   Smoking status: Former    Current packs/day: 0.00    Average packs/day: 0.3 packs/day for 1 year (0.3 ttl pk-yrs)    Types: Cigarettes    Start date: 06/06/1969    Quit date: 06/06/1970    Years since quitting: 53.8   Smokeless tobacco: Never  Vaping Use   Vaping status: Never Used  Substance Use Topics   Alcohol use: Never   Drug use: Never       OPHTHALMIC EXAM: Base Eye Exam     Visual Acuity (Snellen - Linear)       Right Left   Dist cc 20/30 -2 20/20   Dist ph cc NI          Tonometry (Tonopen, 9:14 AM)       Right Left   Pressure 10 11         Pupils       Pupils Dark Light Shape React APD   Right PERRL 2 1 Round Minimal None   Left PERRL 2 1 Round Minimal None         Visual Fields       Left Right    Full Full         Extraocular Movement       Right Left    Full, Ortho Full, Ortho         Neuro/Psych     Oriented x3: Yes   Mood/Affect: Normal         Dilation     Both eyes: 1.0% Mydriacyl, 2.5% Phenylephrine  @ 9:15 AM           Slit Lamp and Fundus Exam     Slit Lamp Exam       Right Left   Lids/Lashes Dermatochalasis - upper lid, Meibomian gland dysfunction Dermatochalasis - upper lid, Meibomian gland dysfunction   Conjunctiva/Sclera White and quiet White and quiet   Cornea arcus, trace tear film debris, well healed cataract wound arcus, trace PEE, mild tear film debris   Anterior Chamber Moderate depth, clear, very narrow temporal angles Moderate depth, clear, very narrow temporal angles   Iris Round and moderately dilated, mild anterior bowing Round and moderately dilated, mild anterior bowing   Lens PC IOL in good position 2-3+ Nuclear sclerosis with brunescence, 2-3+ Cortical cataract   Anterior Vitreous Vitreous  syneresis, Posterior vitreous detachment, +vitreous condensations Vitreous syneresis, Posterior vitreous detachment, vitreous condensations  Fundus Exam       Right Left   Disc Trace pallor, Sharp rim, +cupping Pink and Sharp, +cupping   C/D Ratio 0.7 0.7   Macula Blunted foveal reflex, central cystic changes / edema, RPE mottling, fine drusen, pigment clumping, cluster of MA temporal to fovea and temporal macula -- stably improved -- no heme, mild ERM, punctate central exudation Flat, Blunted foveal reflex, RPE mottling, no heme, no exudates, trace cystic changes nasal macula--improved   Vessels attenuated, Tortuous attenuated, Tortuous   Periphery Attached, mild reticular degeneration, rare MA Attached, mild reticular degeneration, rare MA           Refraction     Wearing Rx       Sphere Cylinder Axis Add   Right -0.50 +0.75 166 +2.75   Left +1.75 Sphere  +2.75    Type: Bifocal           IMAGING AND PROCEDURES  Imaging and Procedures for 03/20/2024  OCT, Retina - OU - Both Eyes       Right Eye Quality was good. Central Foveal Thickness: 277. Progression has been stable. Findings include no SRF, abnormal foveal contour, intraretinal hyper-reflective material, epiretinal membrane, intraretinal fluid, vitreomacular adhesion (Persistent central cyst and surrounding cystic changes ).   Left Eye Quality was good. Central Foveal Thickness: 269. Progression has improved. Findings include no SRF, abnormal foveal contour, intraretinal fluid, vitreous traction, vitreomacular adhesion (persistent central VMT with trace cystic changes, trace cystic changes nasal mac-slightly improved).   Notes *Images captured and stored on drive  Diagnosis / Impression:  OD: +central DME, Persistent central cyst and surrounding cystic changes  OS: persistent central VMT with trace cystic changes, trace cystic changes nasal mac--slightly improved  Clinical management:  See  below  Abbreviations: NFP - Normal foveal profile. CME - cystoid macular edema. PED - pigment epithelial detachment. IRF - intraretinal fluid. SRF - subretinal fluid. EZ - ellipsoid zone. ERM - epiretinal membrane. ORA - outer retinal atrophy. ORT - outer retinal tubulation. SRHM - subretinal hyper-reflective material. IRHM - intraretinal hyper-reflective material      Intravitreal Injection, Pharmacologic Agent - OD - Right Eye       Time Out 03/20/2024. 9:53 AM. Confirmed correct patient, procedure, site, and patient consented.   Anesthesia Topical anesthesia was used. Anesthetic medications included Lidocaine  2%, Proparacaine 0.5%.   Procedure Preparation included 5% betadine to ocular surface, eyelid speculum. A (32g) needle was used.   Injection: 6 mg faricimab -svoa 6 MG/0.05ML (Patient supplied)   Route: Intravitreal, Site: Right Eye   NDC: 49757-903-93, Lot: [id number], Expiration date: 03/05/2025, Waste: 0 mL   Post-op Post injection exam found visual acuity of at least counting fingers. The patient tolerated the procedure well. There were no complications. The patient received written and verbal post procedure care education. Post injection medications were not given.   Notes **PAP MEDICATION ADMINISTERED**            ASSESSMENT/PLAN:    ICD-10-CM   1. Moderate nonproliferative diabetic retinopathy of both eyes with macular edema associated with type 2 diabetes mellitus (HCC)  E11.3313 OCT, Retina - OU - Both Eyes    Intravitreal Injection, Pharmacologic Agent - OD - Right Eye    faricimab -svoa (VABYSMO ) 6mg /0.71mL intravitreal injection    2. Long term (current) use of oral hypoglycemic drugs  Z79.84     3. 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 Essential hypertension  I10  5. Hypertensive retinopathy of both eyes  H35.033     6. Pseudophakia  Z96.1     7. Combined forms of age-related cataract of left eye  H25.812     8. Glaucoma suspect  of both eyes  H40.003      1,2. Moderate non-proliferative diabetic retinopathy, both eyes  - A1c: 6.4 (05.30.25), 6.3 (12.16.24) - s/p IVE OD #1 (06.22.22)--sample, #2 (09.07.22), #3 (11.14.22), #4 (12.28.22), #5 (02.08.23), IVE #6 (03.15.23), #7 (04.19.23), #8 (05.24.23), #9 (06.28.23), #10 (07.27.23), #11 (08.24.23) -- IVE resistance =================== - s/p IVV OD #1 (09.21.23), #2 (10.19.23), #3 (11.16.23), #4 (12.14.23), #5 (01.18.24), #6 (02.21.24), #7 (03.27.24), #8 (05.01.24), #9 (06.05.24), #10 (07.09.24), #11 (08.14.24), #12 (09.18.24), #13 (10.23.24), #14 (11.27.24), #15 (sample -- 01.02.25), #16 (sample -- 02.05.25), #17 (sample -- 03.19.25), #18 (sample -- 04.23.25), #19 (sample--05.28.25), #20 (sample--07.02.25), #21 (sample--08.06.25), #22 (sample--09.10.25) - previously seen at [place]  [facility] by Dr. Phyllistine (Fax [phone]) - history of anti-VEGF injections since 2019 -- last injection IVE OD Feb/Mar 2022 -- q5mo interval - FA (11.14.22) shows OD: Focal hyperflourescence IT to fovea; no NV; OS: No NV - exam w/ scattered MA - OCT shows OD: +central DME, Persistent central cyst and surrounding cystic changes; OS: persistent central VMT with trace cystic changes, trace cystic changes nasal mac--slightly improved at 5 wks - BCVA OD 20/30 from 20/40; OS 20/20 -- stable. - recommend IVV OD #23 (PAP med) today, 10.15.25 -- w/ f/u in 5 wks - pt wishes to proceed - RBA of procedure discussed, questions answered - IVV informed consent obtained and signed, 02.05.25 - see procedure note - Vabysmo  PAP approved - f/u in 5 wks  -- DFE/OCT, possible injection  3. VMA/VMT OU  - central VMA/VMT OD likely contributing to central cyst  - OS stable  - monitor  4,5. Hypertensive retinopathy OU - discussed importance of tight BP control - monitor  6. Pseudophakia OD  - s/p CE/IOL OD (02.29.24, Dr. Austin)  - IOL in good position, doing well  - monitor  7. Mixed Cataract OS -  The symptoms of cataract, surgical options, and treatments and risks were discussed with patient. - discussed diagnosis and progression - under the expert management of Dr. Austin  8. Glaucoma suspect  - C/D 0.7 OU  - IOPs okay (10,13)  - under the expert management of Dr. Austin  Ophthalmic Meds Ordered this visit:  Meds ordered this encounter  Medications   faricimab -svoa (VABYSMO ) 6mg /0.65mL intravitreal injection     Return in about 5 weeks (around 04/24/2024) for NPDR OU, DFE, OCT, Possible Injxn.  There are no Patient Instructions on file for this visit.  This document serves as a record of services personally performed by Redell JUDITHANN Hans, MD, PhD. It was created on their behalf by Almetta Pesa, an ophthalmic technician. The creation of this record is the provider's dictation and/or activities during the visit.    Electronically signed by: Almetta Pesa, OA, 03/20/24  11:53 PM  Redell JUDITHANN Hans, M.D., Ph.D. Diseases & Surgery of the Retina and Vitreous [facility]  I have reviewed the above documentation for accuracy and completeness, and I agree with the above. Redell JUDITHANN Hans, M.D., Ph.D. 03/20/24 11:54 PM   Abbreviations: M myopia (nearsighted); A astigmatism; H hyperopia (farsighted); P presbyopia; Mrx spectacle prescription;  CTL contact lenses; OD right eye; OS left eye; OU both eyes  XT exotropia; ET esotropia; PEK punctate epithelial keratitis; PEE punctate epithelial erosions;  DES dry eye syndrome; MGD meibomian gland dysfunction; ATs artificial tears; PFAT's preservative free artificial tears; NSC nuclear sclerotic cataract; PSC posterior subcapsular cataract; ERM epi-retinal membrane; PVD posterior vitreous detachment; RD retinal detachment; DM diabetes mellitus; DR diabetic retinopathy; NPDR non-proliferative diabetic retinopathy; PDR proliferative diabetic retinopathy; CSME clinically significant macular edema; DME diabetic macular edema; dbh  dot blot hemorrhages; CWS cotton wool spot; POAG primary open angle glaucoma; C/D cup-to-disc ratio; HVF humphrey visual field; GVF goldmann visual field; OCT optical coherence tomography; IOP intraocular pressure; BRVO Branch retinal vein occlusion; CRVO central retinal vein occlusion; CRAO central retinal artery occlusion; BRAO branch retinal artery occlusion; RT retinal tear; SB scleral buckle; PPV pars plana vitrectomy; VH Vitreous hemorrhage; PRP panretinal laser photocoagulation; IVK intravitreal kenalog; VMT vitreomacular traction; MH Macular hole;  NVD neovascularization of the disc; NVE neovascularization elsewhere; AREDS age related eye disease study; ARMD age related macular degeneration; POAG primary open angle glaucoma; EBMD epithelial/anterior basement membrane dystrophy; ACIOL anterior chamber intraocular lens; IOL intraocular lens; PCIOL posterior chamber intraocular lens; Phaco/IOL phacoemulsification with intraocular lens placement; PRK photorefractive keratectomy; LASIK laser assisted in situ keratomileusis; HTN hypertension; DM diabetes mellitus; COPD chronic obstructive pulmonary disease

## 2024-03-20 ENCOUNTER — Ambulatory Visit (INDEPENDENT_AMBULATORY_CARE_PROVIDER_SITE_OTHER): Admitting: Ophthalmology

## 2024-03-20 ENCOUNTER — Encounter (INDEPENDENT_AMBULATORY_CARE_PROVIDER_SITE_OTHER): Payer: Self-pay | Admitting: Ophthalmology

## 2024-03-20 DIAGNOSIS — H35033 Hypertensive retinopathy, bilateral: Secondary | ICD-10-CM

## 2024-03-20 DIAGNOSIS — E113313 Type 2 diabetes mellitus with moderate nonproliferative diabetic retinopathy with macular edema, bilateral: Secondary | ICD-10-CM | POA: Diagnosis not present

## 2024-03-20 DIAGNOSIS — H43823 Vitreomacular adhesion, bilateral: Secondary | ICD-10-CM | POA: Diagnosis not present

## 2024-03-20 DIAGNOSIS — I10 Essential (primary) hypertension: Secondary | ICD-10-CM

## 2024-03-20 DIAGNOSIS — H25812 Combined forms of age-related cataract, left eye: Secondary | ICD-10-CM

## 2024-03-20 DIAGNOSIS — H40003 Preglaucoma, unspecified, bilateral: Secondary | ICD-10-CM | POA: Diagnosis not present

## 2024-03-20 DIAGNOSIS — Z7984 Long term (current) use of oral hypoglycemic drugs: Secondary | ICD-10-CM

## 2024-03-20 DIAGNOSIS — Z961 Presence of intraocular lens: Secondary | ICD-10-CM | POA: Diagnosis not present

## 2024-03-20 MED ORDER — FARICIMAB-SVOA 6 MG/0.05ML IZ SOSY
6.0000 mg | PREFILLED_SYRINGE | INTRAVITREAL | Status: AC | PRN
  Administered 2024-03-20: 6 mg via INTRAVITREAL

## 2024-03-23 ENCOUNTER — Other Ambulatory Visit: Payer: Self-pay | Admitting: Family Medicine

## 2024-03-23 DIAGNOSIS — E1152 Type 2 diabetes mellitus with diabetic peripheral angiopathy with gangrene: Secondary | ICD-10-CM

## 2024-04-01 ENCOUNTER — Telehealth: Payer: Self-pay | Admitting: Podiatry

## 2024-04-01 NOTE — Telephone Encounter (Signed)
 Pt call [facility] to see if they received the  fax still saying they haven't received anything pt wants you to fax to  [phone] email [email or web address] has to be a a pdf attachment pt contact info [phone]

## 2024-04-03 ENCOUNTER — Other Ambulatory Visit: Payer: Self-pay | Admitting: Family Medicine

## 2024-04-03 DIAGNOSIS — I10 Essential (primary) hypertension: Secondary | ICD-10-CM

## 2024-04-03 DIAGNOSIS — I739 Peripheral vascular disease, unspecified: Secondary | ICD-10-CM

## 2024-04-05 ENCOUNTER — Telehealth: Payer: Self-pay | Admitting: Family Medicine

## 2024-04-05 NOTE — Telephone Encounter (Unsigned)
 Copied from CRM [id number]. Topic: General - Other >> Apr 05, 2024  3:22 PM Donee H wrote: Reason for CRM: Patient called stating she is working with [employer] to get diabetic shoes. They are requesting for  Dr. Alvia to sent complete work up and medical records for related to her being a diabetic to make sure she is eligible to obtain the shoes. She provided only fax number and stated did not have phone number for them available. Please fax over information to:  Fax number [phone]

## 2024-04-16 NOTE — Telephone Encounter (Signed)
 Added note to 04/24/2024 that patient will need  DM shoes visit as well.

## 2024-04-17 ENCOUNTER — Other Ambulatory Visit: Payer: Self-pay | Admitting: Family Medicine

## 2024-04-17 DIAGNOSIS — E1152 Type 2 diabetes mellitus with diabetic peripheral angiopathy with gangrene: Secondary | ICD-10-CM

## 2024-04-23 NOTE — Progress Notes (Shared)
 [facility] - Clinic Note  04/26/2024     CHIEF COMPLAINT Patient presents for No chief complaint on file.  HISTORY OF PRESENT ILLNESS: Erin Mitchell is a 78 y.o. female who presents to the clinic today for:    Pt states feels the vision is improving, reading is becoming easier.    Referring physician: Alvia Bring, DO [address]  [address] [place],  [place] [address]  HISTORICAL INFORMATION:   Selected notes from the MEDICAL RECORD NUMBER Referred by Dr. Bring Alvia for DM exam LEE:  Ocular Hx- PMH- DM, HTN, afib, kidney disease, last a1c was 6.8 on 05.05.22    CURRENT MEDICATIONS: No current outpatient medications on file. (Ophthalmic Drugs)   No current facility-administered medications for this visit. (Ophthalmic Drugs)   Current Outpatient Medications (Other)  Medication Sig   Alcohol Swabs (DROPSAFE ALCOHOL PREP) 70 % PADS USE AS DIRECTED   Alpha Lipoic Acid 200 MG CAPS Take 200 mg by mouth daily.   Blood Glucose Monitoring Suppl (TRUE METRIX METER) w/Device KIT Use as directed to test blood glucose daily   Cholecalciferol (VITAMIN D-1000 MAX ST PO) Take 2,000 Units by mouth daily.   clopidogrel  (PLAVIX ) 75 MG tablet TAKE 1 TABLET EVERY DAY   glucose blood (TRUE METRIX BLOOD GLUCOSE TEST) test strip USE AS INSTRUCTED   loperamide  (IMODIUM  A-D) 2 MG tablet Take 2-4 mg by mouth 4 (four) times daily as needed for diarrhea or loose stools.   losartan  (COZAAR ) 100 MG tablet TAKE 1 TABLET EVERY DAY   magnesium oxide (MAG-OX) 400 (240 Mg) MG tablet Take 200 mg by mouth daily.   Melatonin 10 MG CAPS Take 10 mg by mouth at bedtime.   metFORMIN  (GLUCOPHAGE -XR) 500 MG 24 hr tablet TAKE 2 TABLETS TWICE DAILY WITH MEALS   metoprolol  tartrate (LOPRESSOR ) 25 MG tablet Take 1 tablet (25 mg total) by mouth 2 (two) times daily.   NP THYROID  90 MG tablet TAKE 1 TABLET BY MOUTH EVERY DAY *NOT COVERED   Probiotic Product (ALIGN) 4 MG CAPS  Take 4 mg by mouth daily.   rosuvastatin  (CRESTOR ) 40 MG tablet TAKE 1 TABLET EVERY DAY   TRUEplus Lancets 33G MISC Use as directed daily.   Zinc Sulfate (ZINC 15 PO) Take by mouth.   No current facility-administered medications for this visit. (Other)   REVIEW OF SYSTEMS:     ALLERGIES Allergies  Allergen Reactions   Farxiga  [Dapagliflozin ] Other (See Comments)    Yeast infections    PAST MEDICAL HISTORY Past Medical History:  Diagnosis Date   Atrial fibrillation (HCC)    on Plavix    Cataract    Diabetes (HCC)    Diabetic retinopathy (HCC)    Exposure to Agent Orange    Hypertension    Hypertensive retinopathy    hypothyroidism    Hypothyroidism    Kidney disease    Past Surgical History:  Procedure Laterality Date   APPENDECTOMY     CESAREAN SECTION     CHOLECYSTECTOMY     HERNIA REPAIR     TOE AMPUTATION Right    2nd & 3rd   VENTRAL HERNIA REPAIR N/A 12/28/2020   Procedure: LAPAROSCOPIC VENTRAL INCISIONAL HERNIA REPAIR WITH MESH;  Surgeon: Eletha Boas, MD;  Location: [facility];  Service: General;  Laterality: N/A;   FAMILY HISTORY Family History  Problem Relation Age of Onset   Hypertension Father    Diabetes Brother    Emphysema Mother  SOCIAL HISTORY Social History   Tobacco Use   Smoking status: Former    Current packs/day: 0.00    Average packs/day: 0.3 packs/day for 1 year (0.3 ttl pk-yrs)    Types: Cigarettes    Start date: 06/06/1969    Quit date: 06/06/1970    Years since quitting: 53.9   Smokeless tobacco: Never  Vaping Use   Vaping status: Never Used  Substance Use Topics   Alcohol use: Never   Drug use: Never       OPHTHALMIC EXAM: Not recorded    IMAGING AND PROCEDURES  Imaging and Procedures for 04/26/2024          ASSESSMENT/PLAN:  No diagnosis found.  1,2. Moderate non-proliferative diabetic retinopathy, both eyes  - A1c: 6.4 (05.30.25), 6.3 (12.16.24) - s/p IVE OD #1 (06.22.22)--sample, #2 (09.07.22), #3  (11.14.22), #4 (12.28.22), #5 (02.08.23), IVE #6 (03.15.23), #7 (04.19.23), #8 (05.24.23), #9 (06.28.23), #10 (07.27.23), #11 (08.24.23) -- IVE resistance =================== - s/p IVV OD #1 (09.21.23), #2 (10.19.23), #3 (11.16.23), #4 (12.14.23), #5 (01.18.24), #6 (02.21.24), #7 (03.27.24), #8 (05.01.24), #9 (06.05.24), #10 (07.09.24), #11 (08.14.24), #12 (09.18.24), #13 (10.23.24), #14 (11.27.24), #15 (sample -- 01.02.25), #16 (sample -- 02.05.25), #17 (sample -- 03.19.25), #18 (sample -- 04.23.25), #19 (sample--05.28.25), #20 (sample--07.02.25), #21 (sample--08.06.25), #22 (sample--09.10.25) #23(10.15.25) PAP - previously seen at [facility] by Dr. Phyllistine (Fax [phone]) - history of anti-VEGF injections since 2019 -- last injection IVE OD Feb/Mar 2022 -- q40mo interval - FA (11.14.22) shows OD: Focal hyperflourescence IT to fovea; no NV; OS: No NV - exam w/ scattered MA - OCT shows OD: +central DME, Persistent central cyst and surrounding cystic changes; OS: persistent central VMT with trace cystic changes, trace cystic changes nasal mac--slightly improved at 5 wks - BCVA OD 20/30 from 20/40; OS 20/20 -- stable. - recommend IVV OD #24 (PAP med) today, 11.21.25 -- w/ f/u in 5 wks - pt wishes to proceed - RBA of procedure discussed, questions answered - IVV informed consent obtained and signed, 02.05.25 - see procedure note - Vabysmo  PAP approved - f/u in 5 wks  -- DFE/OCT, possible injection  3. VMA/VMT OU  - central VMA/VMT OD likely contributing to central cyst  - OS stable  - monitor  4,5. Hypertensive retinopathy OU - discussed importance of tight BP control - monitor  6. Pseudophakia OD  - s/p CE/IOL OD (02.29.24, Dr. Austin)  - IOL in good position, doing well  - monitor  7. Mixed Cataract OS - The symptoms of cataract, surgical options, and treatments and risks were discussed with patient. - discussed diagnosis and progression - under the expert management of  Dr. Austin  8. Glaucoma suspect  - C/D 0.7 OU  - IOPs okay (10,13)  - under the expert management of Dr. Austin  Ophthalmic Meds Ordered this visit:  No orders of the defined types were placed in this encounter.    No follow-ups on file.  There are no Patient Instructions on file for this visit.  This document serves as a record of services personally performed by Redell JUDITHANN Hans, MD, PhD. It was created on their behalf by Delon Newness COT, an ophthalmic technician. The creation of this record is the provider's dictation and/or activities during the visit.    Electronically signed by: Delon Newness COT 11.18.25  8:57 AM   Abbreviations: M myopia (nearsighted); A astigmatism; H hyperopia (farsighted); P presbyopia; Mrx spectacle prescription;  CTL contact lenses; OD right eye; OS  left eye; OU both eyes  XT exotropia; ET esotropia; PEK punctate epithelial keratitis; PEE punctate epithelial erosions; DES dry eye syndrome; MGD meibomian gland dysfunction; ATs artificial tears; PFAT's preservative free artificial tears; NSC nuclear sclerotic cataract; PSC posterior subcapsular cataract; ERM epi-retinal membrane; PVD posterior vitreous detachment; RD retinal detachment; DM diabetes mellitus; DR diabetic retinopathy; NPDR non-proliferative diabetic retinopathy; PDR proliferative diabetic retinopathy; CSME clinically significant macular edema; DME diabetic macular edema; dbh dot blot hemorrhages; CWS cotton wool spot; POAG primary open angle glaucoma; C/D cup-to-disc ratio; HVF humphrey visual field; GVF goldmann visual field; OCT optical coherence tomography; IOP intraocular pressure; BRVO Branch retinal vein occlusion; CRVO central retinal vein occlusion; CRAO central retinal artery occlusion; BRAO branch retinal artery occlusion; RT retinal tear; SB scleral buckle; PPV pars plana vitrectomy; VH Vitreous hemorrhage; PRP panretinal laser photocoagulation; IVK intravitreal kenalog; VMT  vitreomacular traction; MH Macular hole;  NVD neovascularization of the disc; NVE neovascularization elsewhere; AREDS age related eye disease study; ARMD age related macular degeneration; POAG primary open angle glaucoma; EBMD epithelial/anterior basement membrane dystrophy; ACIOL anterior chamber intraocular lens; IOL intraocular lens; PCIOL posterior chamber intraocular lens; Phaco/IOL phacoemulsification with intraocular lens placement; PRK photorefractive keratectomy; LASIK laser assisted in situ keratomileusis; HTN hypertension; DM diabetes mellitus; COPD chronic obstructive pulmonary disease

## 2024-04-24 ENCOUNTER — Encounter: Payer: Self-pay | Admitting: Family Medicine

## 2024-04-24 ENCOUNTER — Ambulatory Visit: Admitting: Family Medicine

## 2024-04-24 VITALS — BP 129/74 | HR 67 | Ht 64.0 in | Wt 166.0 lb

## 2024-04-24 DIAGNOSIS — I480 Paroxysmal atrial fibrillation: Secondary | ICD-10-CM | POA: Diagnosis not present

## 2024-04-24 DIAGNOSIS — I739 Peripheral vascular disease, unspecified: Secondary | ICD-10-CM | POA: Diagnosis not present

## 2024-04-24 DIAGNOSIS — E039 Hypothyroidism, unspecified: Secondary | ICD-10-CM | POA: Diagnosis not present

## 2024-04-24 DIAGNOSIS — E1152 Type 2 diabetes mellitus with diabetic peripheral angiopathy with gangrene: Secondary | ICD-10-CM | POA: Diagnosis not present

## 2024-04-24 DIAGNOSIS — R251 Tremor, unspecified: Secondary | ICD-10-CM | POA: Insufficient documentation

## 2024-04-24 DIAGNOSIS — E785 Hyperlipidemia, unspecified: Secondary | ICD-10-CM

## 2024-04-24 DIAGNOSIS — E1169 Type 2 diabetes mellitus with other specified complication: Secondary | ICD-10-CM

## 2024-04-24 DIAGNOSIS — Z7984 Long term (current) use of oral hypoglycemic drugs: Secondary | ICD-10-CM

## 2024-04-24 DIAGNOSIS — I10 Essential (primary) hypertension: Secondary | ICD-10-CM | POA: Diagnosis not present

## 2024-04-24 DIAGNOSIS — Z89429 Acquired absence of other toe(s), unspecified side: Secondary | ICD-10-CM

## 2024-04-24 DIAGNOSIS — Z23 Encounter for immunization: Secondary | ICD-10-CM

## 2024-04-24 LAB — POCT GLYCOSYLATED HEMOGLOBIN (HGB A1C): HbA1c, POC (controlled diabetic range): 6.8 % (ref 0.0–7.0)

## 2024-04-24 NOTE — Assessment & Plan Note (Signed)
 She remains on NP thyroid .  Feels well with this.   Update TSH.

## 2024-04-24 NOTE — Assessment & Plan Note (Signed)
 Noticing more at rest.  Referral made to neurology.

## 2024-04-24 NOTE — Assessment & Plan Note (Signed)
 Doing well with rosuvastatin.  Continue current strength.  Updated lipid panel ordered.

## 2024-04-24 NOTE — Progress Notes (Signed)
 [facility] - Clinic Note  04/25/2024     CHIEF COMPLAINT Patient presents for Retina Follow Up  HISTORY OF PRESENT ILLNESS: Erin Mitchell is a 78 y.o. female who presents to the clinic today for:   HPI     Retina Follow Up   Patient presents with  Diabetic Retinopathy.  In both eyes.  This started 3 years ago.  Severity is moderate.  Duration of 5 weeks.  Since onset it is gradually improving.  I, the attending physician,  performed the HPI with the patient and updated documentation appropriately.        Comments   Pt states no changes in vision/no FOL/same floaters/no pain/no ats lately. BS=114 this morning A1c=6.8, 04/24/2024      Last edited by Valdemar Rogue, MD on 04/25/2024 11:56 AM.     Pt states VA seems stable  Referring physician: Alvia Bring, DO [address] [facility] [address]  [address] [place],  [place] [address]  HISTORICAL INFORMATION:   Selected notes from the MEDICAL RECORD NUMBER Referred by Dr. Bring Alvia for DM exam LEE:  Ocular Hx- PMH- DM, HTN, afib, kidney disease, last a1c was 6.8 on 05.05.22    CURRENT MEDICATIONS: No current outpatient medications on file. (Ophthalmic Drugs)   No current facility-administered medications for this visit. (Ophthalmic Drugs)   Current Outpatient Medications (Other)  Medication Sig   Alcohol Swabs (DROPSAFE ALCOHOL PREP) 70 % PADS USE AS DIRECTED   Alpha Lipoic Acid 200 MG CAPS Take 200 mg by mouth daily.   Blood Glucose Monitoring Suppl (TRUE METRIX METER) w/Device KIT Use as directed to test blood glucose daily   Cholecalciferol (VITAMIN D-1000 MAX ST PO) Take 2,000 Units by mouth daily.   clopidogrel  (PLAVIX ) 75 MG tablet TAKE 1 TABLET EVERY DAY   glucose blood (TRUE METRIX BLOOD GLUCOSE TEST) test strip USE AS INSTRUCTED   loperamide  (IMODIUM  A-D) 2 MG tablet Take 2-4 mg by mouth 4 (four) times daily as needed for diarrhea or loose stools.   losartan  (COZAAR ) 100 MG tablet  TAKE 1 TABLET EVERY DAY   magnesium oxide (MAG-OX) 400 (240 Mg) MG tablet Take 200 mg by mouth daily.   Melatonin 10 MG CAPS Take 10 mg by mouth at bedtime.   metFORMIN  (GLUCOPHAGE -XR) 500 MG 24 hr tablet TAKE 2 TABLETS TWICE DAILY WITH MEALS   metoprolol  tartrate (LOPRESSOR ) 25 MG tablet Take 1 tablet (25 mg total) by mouth 2 (two) times daily.   NP THYROID  90 MG tablet TAKE 1 TABLET BY MOUTH EVERY DAY *NOT COVERED   rosuvastatin  (CRESTOR ) 40 MG tablet TAKE 1 TABLET EVERY DAY   TRUEplus Lancets 33G MISC Use as directed daily.   Zinc Sulfate (ZINC 15 PO) Take by mouth.   No current facility-administered medications for this visit. (Other)   REVIEW OF SYSTEMS: ROS   Positive for: Endocrine, Cardiovascular, Eyes Negative for: Constitutional, Gastrointestinal, Neurological, Skin, Genitourinary, Musculoskeletal, HENT, Respiratory, Psychiatric, Allergic/Imm, Heme/Lymph Last edited by Elnor Avelina RAMAN, COT on 04/25/2024  9:30 AM.        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   VENTRAL HERNIA REPAIR N/A 12/28/2020   Procedure: LAPAROSCOPIC VENTRAL INCISIONAL HERNIA REPAIR WITH MESH;  Surgeon: Eletha Boas, MD;  Location: [facility];  Service: General;  Laterality: N/A;   FAMILY HISTORY Family History  Problem Relation Age of Onset   Hypertension Father    Diabetes Brother    Emphysema Mother    SOCIAL HISTORY Social History   Tobacco Use   Smoking  status: Former    Current packs/day: 0.00    Average packs/day: 0.3 packs/day for 1 year (0.3 ttl pk-yrs)    Types: Cigarettes    Start date: 06/06/1969    Quit date: 06/06/1970    Years since quitting: 53.9   Smokeless tobacco: Never  Vaping Use   Vaping status: Never Used  Substance Use Topics   Alcohol use: Never   Drug use: Never       OPHTHALMIC EXAM: Base Eye Exam     Visual Acuity (Snellen - Linear)       Right Left   Dist cc 20/30 20/20   Dist ph cc NI     Correction: Glasses         Tonometry (Tonopen, 9:35 AM)       Right Left   Pressure 11 13         Pupils       Pupils Dark Light Shape React APD   Right PERRL 2 1 Round Minimal None   Left PERRL 2 1 Round Minimal None         Visual Fields       Left Right    Full Full         Extraocular Movement       Right Left    Full, Ortho Full, Ortho         Neuro/Psych     Oriented x3: Yes   Mood/Affect: Normal         Dilation     Both eyes: 1.0% Mydriacyl, 2.5% Phenylephrine  @ 9:36 AM           Slit Lamp and Fundus Exam     Slit Lamp Exam       Right Left   Lids/Lashes Dermatochalasis - upper lid, Meibomian gland dysfunction Dermatochalasis - upper lid, Meibomian gland dysfunction   Conjunctiva/Sclera White and quiet White and quiet   Cornea arcus, trace tear film debris, well healed cataract wound arcus, trace PEE, mild tear film debris   Anterior Chamber Moderate depth, clear, very narrow temporal angles Moderate depth, clear, very narrow temporal angles   Iris Round and moderately dilated, mild anterior bowing Round and moderately dilated, mild anterior bowing   Lens PC IOL in good position 2-3+ Nuclear sclerosis with brunescence, 2-3+ Cortical cataract   Anterior Vitreous Vitreous syneresis, Posterior vitreous detachment, +vitreous condensations Vitreous syneresis, Posterior vitreous detachment, vitreous condensations         Fundus Exam       Right Left   Disc  Trace pallor, Sharp rim, +cupping Pink and Sharp, +cupping   C/D Ratio 0.7 0.7   Macula Blunted foveal reflex, central cystic changes / edema, RPE mottling, fine drusen, pigment clumping, cluster of MA  temporal to fovea and temporal macula -- stably improved -- no heme, mild ERM, punctate central exudation Flat, Blunted foveal reflex, RPE mottling, no heme, no exudates   Vessels attenuated, Tortuous attenuated, Tortuous   Periphery Attached, mild reticular degeneration, rare MA Attached, mild reticular degeneration, rare MA           Refraction     Wearing Rx       Sphere Cylinder Axis Add   Right -0.50 +0.75 166 +2.75   Left +1.75 Sphere  +2.75    Type: Bifocal           IMAGING AND PROCEDURES  Imaging and Procedures for 04/25/2024  OCT, Retina - OU - Both Eyes       Right Eye Quality was good. Central Foveal Thickness: 272. Progression has improved. Findings include no SRF, abnormal foveal contour, intraretinal hyper-reflective material, epiretinal membrane, intraretinal fluid, vitreomacular adhesion (Persistent central cyst and surrounding cystic changes--slightly improved).   Left Eye Quality was good. Central Foveal Thickness: 266. Progression has been stable. Findings include no SRF, abnormal foveal contour, intraretinal fluid, vitreous traction, vitreomacular adhesion (persistent central VMT with trace cystic changes).   Notes *Images captured and stored on drive  Diagnosis / Impression:  OD: +central DME, Persistent central cyst and surrounding cystic changes --slightly improved OS: persistent central VMT with trace cystic changes  Clinical management:  See below  Abbreviations: NFP - Normal foveal profile. CME - cystoid macular edema. PED - pigment epithelial detachment. IRF - intraretinal fluid. SRF - subretinal fluid. EZ - ellipsoid zone. ERM - epiretinal membrane. ORA - outer retinal atrophy. ORT - outer retinal tubulation. SRHM - subretinal  hyper-reflective material. IRHM - intraretinal hyper-reflective material      Intravitreal Injection, Pharmacologic Agent - OD - Right Eye       Time Out 04/25/2024. 10:30 AM. Confirmed correct patient, procedure, site, and patient consented.   Anesthesia Topical anesthesia was used. Anesthetic medications included Lidocaine  2%, Proparacaine 0.5%.   Procedure Preparation included 5% betadine to ocular surface, eyelid speculum. A supplied (32g) needle was used.   Injection: 6 mg faricimab -svoa 6 MG/0.05ML (Patient supplied)   Route: Intravitreal, Site: Right Eye   NDC: 49757-903-93, Lot: [id number], Expiration date: 03/05/2025, Waste: 0 mL   Post-op Post injection exam found visual acuity of at least counting fingers. The patient tolerated the procedure well. There were no complications. The patient received written and verbal post procedure care education. Post injection medications were not given.   Notes **PAP MEDICATION ADMINISTERED**             ASSESSMENT/PLAN:    ICD-10-CM   1. 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 Long term (current) use of oral hypoglycemic drugs  Z79.84     3. 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 Essential hypertension  I10     5. Hypertensive retinopathy of both eyes  H35.033     6. Pseudophakia  Z96.1     7. Combined forms of age-related cataract of left eye  H25.812     8. Glaucoma suspect of both eyes  H40.003       1,2. Moderate non-proliferative diabetic retinopathy, both eyes  - A1c: 6.4 (05.30.25), 6.3 (  12.16.24) - s/p IVE OD #1 (06.22.22)--sample, #2 (09.07.22), #3 (11.14.22), #4 (12.28.22), #5 (02.08.23), IVE #6 (03.15.23), #7 (04.19.23), #8 (05.24.23), #9  (06.28.23), #10 (07.27.23), #11 (08.24.23) -- IVE resistance =================== - s/p IVV OD #1 (09.21.23), #2 (10.19.23), #3 (11.16.23), #4 (12.14.23), #5 (01.18.24), #6 (02.21.24), #7 (03.27.24), #8 (05.01.24), #9 (06.05.24), #10 (07.09.24), #11 (08.14.24), #12 (09.18.24), #13 (10.23.24), #14 (11.27.24), #15 (sample -- 01.02.25), #16 (sample -- 02.05.25), #17 (sample -- 03.19.25), #18 (sample -- 04.23.25), #19 (sample--05.28.25), #20 (sample--07.02.25), #21 (sample--08.06.25), #22 (sample--09.10.25), #23 (10.15.25) - previously seen at [facility] by Dr. Phyllistine (Fax [phone]) - history of anti-VEGF injections since 2019 -- last injection IVE OD Feb/Mar 2022 -- q35mo interval - FA (11.14.22) shows OD: Focal hyperflourescence IT to fovea; no NV; OS: No NV - exam w/ scattered MA - OCT shows OD: +central DME, Persistent central cyst and surrounding cystic changes--slightly improved; OS: persistent central VMT with trace cystic changes at 5 wks - BCVA OD 20/30; OS 20/20 -- stable OU - recommend IVV OD #24 (PAP med) today, 11.20.25 -- w/ f/u in 4 wks due to holiday - pt wishes to proceed - RBA of procedure discussed, questions answered - IVV informed consent obtained and signed, 02.05.25 - see procedure note - Vabysmo  PAP approved - f/u in 4 wks due to holiday  -- DFE/OCT, possible injection  3. VMA/VMT OU  - central VMA/VMT OD likely contributing to central cyst  - OS stable  - monitor  4,5. Hypertensive retinopathy OU - discussed importance of tight BP control - monitor  6. Pseudophakia OD  - s/p CE/IOL OD (02.29.24, Dr. Austin)  - IOL in good position, doing well  - monitor  7. Mixed Cataract OS - The symptoms of cataract, surgical options, and treatments and risks were discussed with patient. - discussed diagnosis and progression - under the expert management of Dr. Austin  8. Glaucoma suspect  - C/D 0.7 OU  - IOPs okay (11,13)  - under the expert management of Dr.  Austin  Ophthalmic Meds Ordered this visit:  Meds ordered this encounter  Medications   faricimab -svoa (VABYSMO ) 6mg /0.41mL intravitreal injection     Return in about 4 weeks (around 05/23/2024) for NPDR OU, DFE, OCT, Possible Injxn.  There are no Patient Instructions on file for this visit.  This document serves as a record of services personally performed by Redell JUDITHANN Hans, MD, PhD. It was created on their behalf by Auston Muzzy, COMT. The creation of this record is the provider's dictation and/or activities during the visit.  Electronically signed by: Auston Muzzy, COMT 04/25/24 11:58 AM  This document serves as a record of services personally performed by Redell JUDITHANN Hans, MD, PhD. It was created on their behalf by Almetta Pesa, an ophthalmic technician. The creation of this record is the provider's dictation and/or activities during the visit.    Electronically signed by: Almetta Pesa, OA, 04/25/24  11:58 AM   Redell JUDITHANN Hans, M.D., Ph.D. Diseases & Surgery of the Retina and Vitreous [facility]  I have reviewed the above documentation for accuracy and completeness, and I agree with the above. Redell JUDITHANN Hans, M.D., Ph.D. 04/25/24 11:59 AM   Abbreviations: M myopia (nearsighted); A astigmatism; H hyperopia (farsighted); P presbyopia; Mrx spectacle prescription;  CTL contact lenses; OD right eye; OS left eye; OU both eyes  XT exotropia; ET esotropia; PEK punctate epithelial keratitis; PEE punctate epithelial erosions; DES dry eye syndrome; MGD meibomian gland  dysfunction; ATs artificial tears; PFAT's preservative free artificial tears; NSC nuclear sclerotic cataract; PSC posterior subcapsular cataract; ERM epi-retinal membrane; PVD posterior vitreous detachment; RD retinal detachment; DM diabetes mellitus; DR diabetic retinopathy; NPDR non-proliferative diabetic retinopathy; PDR proliferative diabetic retinopathy; CSME clinically significant macular edema;  DME diabetic macular edema; dbh dot blot hemorrhages; CWS cotton wool spot; POAG primary open angle glaucoma; C/D cup-to-disc ratio; HVF humphrey visual field; GVF goldmann visual field; OCT optical coherence tomography; IOP intraocular pressure; BRVO Branch retinal vein occlusion; CRVO central retinal vein occlusion; CRAO central retinal artery occlusion; BRAO branch retinal artery occlusion; RT retinal tear; SB scleral buckle; PPV pars plana vitrectomy; VH Vitreous hemorrhage; PRP panretinal laser photocoagulation; IVK intravitreal kenalog; VMT vitreomacular traction; MH Macular hole;  NVD neovascularization of the disc; NVE neovascularization elsewhere; AREDS age related eye disease study; ARMD age related macular degeneration; POAG primary open angle glaucoma; EBMD epithelial/anterior basement membrane dystrophy; ACIOL anterior chamber intraocular lens; IOL intraocular lens; PCIOL posterior chamber intraocular lens; Phaco/IOL phacoemulsification with intraocular lens placement; PRK photorefractive keratectomy; LASIK laser assisted in situ keratomileusis; HTN hypertension; DM diabetes mellitus; COPD chronic obstructive pulmonary disease

## 2024-04-24 NOTE — Progress Notes (Signed)
 Erin Mitchell [facility] - 78 y.o. female MRN [id number]  Date of birth: [birth year]  Subjective Chief Complaint  Patient presents with   Diabetes   Hypertension   Diabetic Shoes    HPI Erin Mitchell is a 78 y.o. female here today for follow up visit.   She reports that she is doing pretty well.   She has noted increased tremor.  Worsening at rest.  Sometimes notices with action as well.    She remains on metformin  for management of T2DM.  A1c is up slightly at 6.8%.  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  She does continue to see podiatry as well.   She remains on plavix  for history of PAD.   Solitary episode of PAF in the past.  No episodes on zio patch this year.  She remains on metoprolol    Feels ok on current strength of NP thyroid   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 & 3rd   VENTRAL HERNIA REPAIR N/A 12/28/2020   Procedure: LAPAROSCOPIC VENTRAL INCISIONAL HERNIA REPAIR WITH MESH;  Surgeon: Eletha Boas, MD;  Location: [facility];  Service: General;  Laterality: N/A;    Social History   Socioeconomic History   Marital status: Married    Spouse name: Not on file   Number of children: 7   Years of education: Not on file   Highest education level: Not on file  Occupational History   Occupation: Retired  Tobacco Use    Smoking status: Former    Current packs/day: 0.00    Average packs/day: 0.3 packs/day for 1 year (0.3 ttl pk-yrs)    Types: Cigarettes    Start date: 06/06/1969    Quit date: 06/06/1970    Years since quitting: 53.9   Smokeless tobacco: Never  Vaping Use   Vaping status: Never Used  Substance and Sexual Activity   Alcohol use: Never   Drug use: Never   Sexual activity: Not Currently    Partners: Male  Other Topics Concern   Not on file  Social History Narrative   Not on file   Social Drivers of Health   Financial Resource Strain: Low Risk  (04/24/2024)   Overall Financial Resource Strain (CARDIA)    Difficulty of Paying Living Expenses: Not hard at all  Food Insecurity: No Food Insecurity (04/24/2024)   Hunger Vital Sign    Worried About Running Out of Food in the Last Year: Never true    Ran Out of Food in the Last Year: Never true  Transportation Needs: No Transportation Needs (04/24/2024)   PRAPARE - Administrator, Civil Service (Medical): No  Lack of Transportation (Non-Medical): No  Physical Activity: Insufficiently Active (04/24/2024)   Exercise Vital Sign    Days of Exercise per Week: 2 days    Minutes of Exercise per Session: 40 min  Stress: No Stress Concern Present (04/24/2024)   [employer] of Occupational Health - Occupational Stress Questionnaire    Feeling of Stress: Not at all  Social Connections: Socially Integrated (04/24/2024)   Social Connection and Isolation Panel    Frequency of Communication with Friends and Family: Three times a week    Frequency of Social Gatherings with Friends and Family: Once a week    Attends Religious Services: 1 to 4 times per year    Active Member of Clubs or Organizations: Yes    Attends Banker Meetings: 1 to 4 times per year    Marital Status: Married    Family History  Problem Relation Age of Onset   Hypertension Father    Diabetes Brother    Emphysema Mother     Health  Maintenance  Topic Date Due   Medicare Annual Wellness (AWV)  Never done   Influenza Vaccine  01/05/2024   Diabetic kidney evaluation - eGFR measurement  01/30/2024   Diabetic kidney evaluation - Urine ACR  01/30/2024   Zoster Vaccines- Shingrix (1 of 2) 07/25/2024 (Originally 04/12/1998)   COVID-19 Vaccine (3 - 2025-26 season) 05/10/2025 (Originally 02/05/2024)   HEMOGLOBIN A1C  10/22/2024   FOOT EXAM  11/08/2024   OPHTHALMOLOGY EXAM  12/05/2024   DTaP/Tdap/Td (2 - Td or Tdap) 08/15/2029   Pneumococcal Vaccine: 50+ Years  Completed   Meningococcal B Vaccine  Aged Out   DEXA SCAN  Discontinued   Hepatitis C Screening  Discontinued     ----------------------------------------------------------------------------------------------------------------------------------------------------------------------------------------------------------------- Physical Exam BP 129/74 (BP Location: Left Arm, Patient Position: Sitting, Cuff Size: Small)   Pulse 67   Ht 5' 4 (1.626 m)   Wt 166 lb (75.3 kg)   SpO2 99%   BMI 28.49 kg/m   Physical Exam Constitutional:      Appearance: Normal appearance.  HENT:     Head: Normocephalic and atraumatic.  Eyes:     General: No scleral icterus. Cardiovascular:     Rate and Rhythm: Normal rate and regular rhythm.  Pulmonary:     Effort: Pulmonary effort is normal.     Breath sounds: Normal breath sounds.  Musculoskeletal:     Cervical back: Neck supple.  Neurological:     Mental Status: She is alert.  Psychiatric:        Mood and Affect: Mood normal.        Behavior: Behavior normal.     ------------------------------------------------------------------------------------------------------------------------------------------------------------------------------------------------------------------- Assessment and Plan  Type 2 diabetes mellitus with diabetic peripheral angiopathy and gangrene, without long-term current use of insulin  (HCC) Blood  sugars remain fairly well controlled.Recommend continued dietary changes.  She will remain on metformin  for now.  History of diabetic ulcer s/p amputation.  Continues to see podiatry.  Recommend use of diabetic socks and shoes.   PAF (paroxysmal atrial fibrillation) (HCC) Solitary episode after surgery in the past.  She is having increased palpitations over the past week or two. No episodes on zio patch over the summer.   Acquired hypothyroidism She remains on NP thyroid .  Feels well with this.   Update TSH.   Hypertension Bp is well controlled. Encouraged to continue home monitoring.   Hyperlipidemia associated with type 2 diabetes mellitus (HCC) Doing well with rosuvastatin .  Continue current strength.  Updated  lipid panel ordered.   PAD (peripheral artery disease) Status post angioplasty.  She does continue to see vascular surgery.  She will remain on Plavix   History of amputation of toe Related to T2DM and PAD.  Continues to see podiatry.    Tremor Noticing more at rest.  Referral made to neurology.    No orders of the defined types were placed in this encounter.   Return in about 6 months (around 10/22/2024) for Hypertension, Type 2 Diabetes.

## 2024-04-24 NOTE — Assessment & Plan Note (Signed)
 Bp is well controlled. Encouraged to continue home monitoring.

## 2024-04-24 NOTE — Assessment & Plan Note (Signed)
 Solitary episode after surgery in the past.  She is having increased palpitations over the past week or two. No episodes on zio patch over the summer.

## 2024-04-24 NOTE — Assessment & Plan Note (Signed)
 Related to T2DM and PAD.  Continues to see podiatry.

## 2024-04-24 NOTE — Assessment & Plan Note (Signed)
 Status post angioplasty.  She does continue to see vascular surgery.  She will remain on Plavix

## 2024-04-24 NOTE — Assessment & Plan Note (Signed)
 Blood sugars remain fairly well controlled.Recommend continued dietary changes.  She will remain on metformin  for now.  History of diabetic ulcer s/p amputation.  Continues to see podiatry.  Recommend use of diabetic socks and shoes.

## 2024-04-25 ENCOUNTER — Ambulatory Visit (INDEPENDENT_AMBULATORY_CARE_PROVIDER_SITE_OTHER): Admitting: Ophthalmology

## 2024-04-25 ENCOUNTER — Encounter (INDEPENDENT_AMBULATORY_CARE_PROVIDER_SITE_OTHER): Payer: Self-pay | Admitting: Ophthalmology

## 2024-04-25 DIAGNOSIS — H40003 Preglaucoma, unspecified, bilateral: Secondary | ICD-10-CM | POA: Diagnosis not present

## 2024-04-25 DIAGNOSIS — E113313 Type 2 diabetes mellitus with moderate nonproliferative diabetic retinopathy with macular edema, bilateral: Secondary | ICD-10-CM | POA: Diagnosis not present

## 2024-04-25 DIAGNOSIS — H25812 Combined forms of age-related cataract, left eye: Secondary | ICD-10-CM | POA: Diagnosis not present

## 2024-04-25 DIAGNOSIS — H43823 Vitreomacular adhesion, bilateral: Secondary | ICD-10-CM

## 2024-04-25 DIAGNOSIS — H35033 Hypertensive retinopathy, bilateral: Secondary | ICD-10-CM

## 2024-04-25 DIAGNOSIS — Z961 Presence of intraocular lens: Secondary | ICD-10-CM

## 2024-04-25 DIAGNOSIS — I10 Essential (primary) hypertension: Secondary | ICD-10-CM | POA: Diagnosis not present

## 2024-04-25 DIAGNOSIS — Z7984 Long term (current) use of oral hypoglycemic drugs: Secondary | ICD-10-CM

## 2024-04-25 MED ORDER — FARICIMAB-SVOA 6 MG/0.05ML IZ SOSY
6.0000 mg | PREFILLED_SYRINGE | INTRAVITREAL | Status: AC | PRN
  Administered 2024-04-25: 6 mg via INTRAVITREAL

## 2024-04-26 ENCOUNTER — Encounter (INDEPENDENT_AMBULATORY_CARE_PROVIDER_SITE_OTHER): Admitting: Ophthalmology

## 2024-04-26 DIAGNOSIS — E113313 Type 2 diabetes mellitus with moderate nonproliferative diabetic retinopathy with macular edema, bilateral: Secondary | ICD-10-CM

## 2024-04-26 DIAGNOSIS — H35033 Hypertensive retinopathy, bilateral: Secondary | ICD-10-CM

## 2024-04-26 DIAGNOSIS — H43823 Vitreomacular adhesion, bilateral: Secondary | ICD-10-CM

## 2024-04-26 DIAGNOSIS — H40003 Preglaucoma, unspecified, bilateral: Secondary | ICD-10-CM

## 2024-04-26 DIAGNOSIS — I10 Essential (primary) hypertension: Secondary | ICD-10-CM

## 2024-04-26 DIAGNOSIS — Z961 Presence of intraocular lens: Secondary | ICD-10-CM

## 2024-04-26 DIAGNOSIS — Z7984 Long term (current) use of oral hypoglycemic drugs: Secondary | ICD-10-CM

## 2024-04-26 DIAGNOSIS — H25812 Combined forms of age-related cataract, left eye: Secondary | ICD-10-CM

## 2024-04-26 LAB — CBC WITH DIFFERENTIAL/PLATELET
Basophils Absolute: 0 x10E3/uL (ref 0.0–0.2)
Basos: 0 %
EOS (ABSOLUTE): 0.3 x10E3/uL (ref 0.0–0.4)
Eos: 3 %
Hematocrit: 38.5 % (ref 34.0–46.6)
Hemoglobin: 11.9 g/dL (ref 11.1–15.9)
Immature Grans (Abs): 0 x10E3/uL (ref 0.0–0.1)
Immature Granulocytes: 0 %
Lymphocytes Absolute: 2.1 x10E3/uL (ref 0.7–3.1)
Lymphs: 23 %
MCH: 28.3 pg (ref 26.6–33.0)
MCHC: 30.9 g/dL — ABNORMAL LOW (ref 31.5–35.7)
MCV: 92 fL (ref 79–97)
Monocytes Absolute: 0.9 x10E3/uL (ref 0.1–0.9)
Monocytes: 9 %
Neutrophils Absolute: 5.8 x10E3/uL (ref 1.4–7.0)
Neutrophils: 65 %
Platelets: 253 x10E3/uL (ref 150–450)
RBC: 4.2 x10E6/uL (ref 3.77–5.28)
RDW: 13.6 % (ref 11.7–15.4)
WBC: 9.2 x10E3/uL (ref 3.4–10.8)

## 2024-04-26 LAB — CMP14+EGFR
ALT: 17 IU/L (ref 0–32)
AST: 19 IU/L (ref 0–40)
Albumin: 4.5 g/dL (ref 3.8–4.8)
Alkaline Phosphatase: 64 IU/L (ref 49–135)
BUN/Creatinine Ratio: 21 (ref 12–28)
BUN: 20 mg/dL (ref 8–27)
Bilirubin Total: 0.4 mg/dL (ref 0.0–1.2)
CO2: 20 mmol/L (ref 20–29)
Calcium: 9.7 mg/dL (ref 8.7–10.3)
Chloride: 102 mmol/L (ref 96–106)
Creatinine, Ser: 0.95 mg/dL (ref 0.57–1.00)
Globulin, Total: 2.1 g/dL (ref 1.5–4.5)
Glucose: 154 mg/dL — ABNORMAL HIGH (ref 70–99)
Potassium: 4.4 mmol/L (ref 3.5–5.2)
Sodium: 138 mmol/L (ref 134–144)
Total Protein: 6.6 g/dL (ref 6.0–8.5)
eGFR: 62 mL/min/1.73 (ref >59)

## 2024-04-26 LAB — MICROALBUMIN / CREATININE URINE RATIO
Creatinine, Urine: 49.5 mg/dL
Microalb/Creat Ratio: 482 mg/g{creat} — ABNORMAL HIGH (ref 0–29)
Microalbumin, Urine: 238.8 ug/mL

## 2024-04-26 LAB — LIPID PANEL WITH LDL/HDL RATIO
Cholesterol, Total: 98 mg/dL — ABNORMAL LOW (ref 100–199)
HDL: 49 mg/dL (ref >39)
LDL Chol Calc (NIH): 31 mg/dL (ref 0–99)
LDL/HDL Ratio: 0.6 ratio (ref 0.0–3.2)
Triglycerides: 92 mg/dL (ref 0–149)
VLDL Cholesterol Cal: 18 mg/dL (ref 5–40)

## 2024-04-26 LAB — TSH: TSH: 1.29 u[IU]/mL (ref 0.450–4.500)

## 2024-05-10 ENCOUNTER — Ambulatory Visit: Admitting: Podiatry

## 2024-05-17 ENCOUNTER — Other Ambulatory Visit: Payer: Self-pay | Admitting: Family Medicine

## 2024-05-17 ENCOUNTER — Ambulatory Visit: Payer: Self-pay | Admitting: Family Medicine

## 2024-05-22 NOTE — Progress Notes (Shared)
 [facility] - Clinic Note  05/23/2024     CHIEF COMPLAINT Patient presents for Retina Follow Up  HISTORY OF PRESENT ILLNESS: Erin Mitchell is a 78 y.o. female who presents to the clinic today for:   HPI     Retina Follow Up   Patient presents with  Diabetic Retinopathy.  In both eyes.  This started 3 years ago.  Severity is moderate.  Duration of 5 weeks.  Since onset it is gradually improving.  I, the attending physician,  performed the HPI with the patient and updated documentation appropriately.        Comments   Pt states no changes in vision/no FOL/same floaters/no pain/pt using ats about twice a week lately. BS=139 this morning A1c=6.8, 04/24/2024      Last edited by Elnor Avelina RAMAN, COT on 05/23/2024  7:56 AM.      Pt states VA seems stable  Referring physician: Alvia Bring, DO [address] [facility] [address]  [address] [place],  [place] [address]  HISTORICAL INFORMATION:   Selected notes from the MEDICAL RECORD NUMBER Referred by Dr. Bring Alvia for DM exam LEE:  Ocular Hx- PMH- DM, HTN, afib, kidney disease, last a1c was 6.8 on 05.05.22    CURRENT MEDICATIONS: No current outpatient medications on file. (Ophthalmic Drugs)   No current facility-administered medications for this visit. (Ophthalmic Drugs)   Current Outpatient Medications (Other)  Medication Sig   Alcohol Swabs (DROPSAFE ALCOHOL PREP) 70 % PADS USE AS DIRECTED   Alpha Lipoic Acid 200 MG CAPS Take 200 mg by mouth daily.   Blood Glucose Monitoring Suppl (TRUE METRIX METER) w/Device KIT Use as directed to test blood glucose daily   Cholecalciferol (VITAMIN D-1000 MAX ST PO) Take 2,000 Units by mouth daily.   clopidogrel  (PLAVIX ) 75 MG tablet TAKE 1 TABLET EVERY DAY   glucose blood (TRUE METRIX BLOOD GLUCOSE TEST) test strip USE AS INSTRUCTED   loperamide  (IMODIUM  A-D) 2 MG tablet Take 2-4 mg by mouth 4 (four) times daily as needed for diarrhea or loose stools.    losartan  (COZAAR ) 100 MG tablet TAKE 1 TABLET EVERY DAY   magnesium oxide (MAG-OX) 400 (240 Mg) MG tablet Take 200 mg by mouth daily.   Melatonin 10 MG CAPS Take 10 mg by mouth at bedtime.   metFORMIN  (GLUCOPHAGE -XR) 500 MG 24 hr tablet TAKE 2 TABLETS TWICE DAILY WITH MEALS   metoprolol  tartrate (LOPRESSOR ) 25 MG tablet Take 1 tablet (25 mg total) by mouth 2 (two) times daily.   NP THYROID  90 MG tablet TAKE 1 TABLET BY MOUTH EVERY DAY *NOT COVERED   rosuvastatin  (CRESTOR ) 40 MG tablet TAKE 1 TABLET EVERY DAY   TRUEplus Lancets 33G MISC Use as directed daily.   Zinc Sulfate (ZINC 15 PO) Take by mouth.   No current facility-administered medications for this visit. (Other)   REVIEW OF SYSTEMS: ROS   Positive for: Endocrine, Cardiovascular, Eyes Negative for: Constitutional, Gastrointestinal, Neurological, Skin, Genitourinary, Musculoskeletal, HENT, Respiratory, Psychiatric, Allergic/Imm, Heme/Lymph Last edited by Elnor Avelina RAMAN, COT on 05/23/2024  7:56 AM.         ALLERGIES Allergies  Allergen Reactions   Farxiga  [Dapagliflozin ] Other (See Comments)    Yeast infections    PAST MEDICAL HISTORY Past Medical History:  Diagnosis Date   Atrial fibrillation (HCC)    on Plavix    Cataract    Diabetes (HCC)    Diabetic retinopathy (HCC)    Exposure to Agent 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   VENTRAL HERNIA REPAIR N/A 12/28/2020   Procedure: LAPAROSCOPIC VENTRAL INCISIONAL HERNIA REPAIR WITH MESH;  Surgeon: Eletha Boas, MD;  Location: [facility];  Service: General;  Laterality: N/A;   FAMILY HISTORY Family History  Problem Relation Age of Onset   Hypertension Father    Diabetes Brother    Emphysema Mother    SOCIAL HISTORY Social  History   Tobacco Use   Smoking status: Former    Current packs/day: 0.00    Average packs/day: 0.3 packs/day for 1 year (0.3 ttl pk-yrs)    Types: Cigarettes    Start date: 06/06/1969    Quit date: 06/06/1970    Years since quitting: 54.0   Smokeless tobacco: Never  Vaping Use   Vaping status: Never Used  Substance Use Topics   Alcohol use: Never   Drug use: Never       OPHTHALMIC EXAM: Base Eye Exam     Neuro/Psych     Oriented x3: Yes   Mood/Affect: Normal           Slit Lamp and Fundus Exam     Slit Lamp Exam       Right Left   Lids/Lashes Dermatochalasis - upper lid, Meibomian gland dysfunction Dermatochalasis - upper lid, Meibomian gland dysfunction   Conjunctiva/Sclera White and quiet White and quiet   Cornea arcus, trace tear film debris, well healed cataract wound arcus, trace PEE, mild tear film debris   Anterior Chamber Moderate depth, clear, very narrow temporal angles Moderate depth, clear, very narrow temporal angles   Iris Round and moderately dilated, mild anterior bowing Round and moderately dilated, mild anterior bowing   Lens PC IOL in good position 2-3+ Nuclear sclerosis with brunescence, 2-3+ Cortical cataract   Anterior Vitreous Vitreous syneresis, Posterior vitreous detachment, +vitreous condensations Vitreous syneresis, Posterior vitreous detachment, vitreous condensations         Fundus Exam       Right Left   Disc Trace pallor, Sharp rim, +cupping Pink and Sharp, +cupping   C/D Ratio 0.7 0.7   Macula Blunted foveal reflex, central cystic changes / edema, RPE mottling, fine drusen, pigment clumping, cluster of MA temporal to fovea and temporal macula -- stably improved -- no heme, mild ERM, punctate central exudation Flat, Blunted foveal reflex, RPE mottling, no heme, no exudates   Vessels attenuated, Tortuous attenuated, Tortuous   Periphery Attached, mild reticular degeneration, rare MA Attached, mild reticular degeneration, rare MA            IMAGING AND PROCEDURES  Imaging and Procedures for 05/23/2024           ASSESSMENT/PLAN:    ICD-10-CM   1. Moderate nonproliferative diabetic retinopathy of both eyes with macular edema associated with type 2 diabetes mellitus (HCC)  E11.3313 OCT, Retina - OU - Both Eyes    2. Long term (current) use of oral hypoglycemic drugs  Z79.84     3. 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 Essential hypertension  I10     5. Hypertensive retinopathy of both eyes  H35.033     6.  Pseudophakia  Z96.1     7. Combined forms of age-related cataract of left eye  H25.812     8. Glaucoma suspect of both eyes  H40.003        1,2. Moderate non-proliferative diabetic retinopathy, both eyes  - A1c: 6.4 (05.30.25), 6.3 (12.16.24) - s/p IVE OD #1 (06.22.22)--sample, #2 (09.07.22), #3 (11.14.22), #4 (12.28.22), #5 (02.08.23), IVE #6 (03.15.23), #7 (04.19.23), #8 (05.24.23), #9 (06.28.23), #10 (07.27.23), #11 (08.24.23) -- IVE resistance =================== - s/p IVV OD #1 (09.21.23), #2 (10.19.23), #3 (11.16.23), #4 (12.14.23), #5 (01.18.24), #6 (02.21.24), #7 (03.27.24), #8 (05.01.24), #9 (06.05.24), #10 (07.09.24), #11 (08.14.24), #12 (09.18.24), #13 (10.23.24), #14 (11.27.24), #15 (sample -- 01.02.25), #16 (sample -- 02.05.25), #17 (sample -- 03.19.25), #18 (sample -- 04.23.25), #19 (sample--05.28.25), #20 (sample--07.02.25), #21 (sample--08.06.25), #22 (sample--09.10.25), #23 (PAP--10.15.25), #24 (PAP--11.20.25) - previously seen at [facility] by Dr. Phyllistine (Fax [phone]) - history of anti-VEGF injections since 2019 -- last injection IVE OD Feb/Mar 2022 -- q65mo interval - FA (11.14.22) shows OD: Focal hyperflourescence IT to fovea; no NV; OS: No NV - exam w/ scattered MA - OCT shows OD: +central DME, Persistent central cyst and surrounding cystic changes--slightly improved; OS: persistent central VMT with trace cystic changes at 4 wks - BCVA OD 20/30; OS  20/20 -- stable OU - recommend IVV OD #25 (PAP med) today, 12.18.25 -- w/ f/u in 5 wks  - pt wishes to proceed - RBA of procedure discussed, questions answered - IVV informed consent obtained and signed, 02.05.25 - see procedure note - Vabysmo  PAP approved - f/u in 5 wks -- DFE/OCT, possible injection  3. VMA/VMT OU  - central VMA/VMT OD likely contributing to central cyst  - OS stable  - monitor  4,5. Hypertensive retinopathy OU - discussed importance of tight BP control - monitor  6. Pseudophakia OD  - s/p CE/IOL OD (02.29.24, Dr. Austin)  - IOL in good position, doing well  - monitor  7. Mixed Cataract OS - The symptoms of cataract, surgical options, and treatments and risks were discussed with patient. - discussed diagnosis and progression - under the expert management of Dr. Austin  8. Glaucoma suspect  - C/D 0.7 OU  - IOPs okay (08,07)  - under the expert management of Dr. Austin  Ophthalmic Meds Ordered this visit:  No orders of the defined types were placed in this encounter.    No follow-ups on file.  There are no Patient Instructions on file for this visit.  This document serves as a record of services personally performed by Redell JUDITHANN Hans, MD, PhD. It was created on their behalf by Auston Muzzy, COMT. The creation of this record is the provider's dictation and/or activities during the visit.  Electronically signed by: Auston Muzzy, COMT 05/23/2024 7:56 AM  This document serves as a record of services personally performed by Redell JUDITHANN Hans, MD, PhD. It was created on their behalf by Almetta Pesa, an ophthalmic technician. The creation of this record is the provider's dictation and/or activities during the visit.    Electronically signed by: Almetta Pesa, OA, 05/23/2024  7:56 AM    Redell JUDITHANN Hans, M.D., Ph.D. Diseases & Surgery of the Retina and Vitreous [facility]   Abbreviations: M myopia (nearsighted); A astigmatism; H  hyperopia (farsighted); P presbyopia; Mrx spectacle prescription;  CTL contact lenses; OD right eye; OS left eye; OU both eyes  XT exotropia; ET esotropia; PEK punctate epithelial keratitis; PEE punctate  epithelial erosions; DES dry eye syndrome; MGD meibomian gland dysfunction; ATs artificial tears; PFAT's preservative free artificial tears; NSC nuclear sclerotic cataract; PSC posterior subcapsular cataract; ERM epi-retinal membrane; PVD posterior vitreous detachment; RD retinal detachment; DM diabetes mellitus; DR diabetic retinopathy; NPDR non-proliferative diabetic retinopathy; PDR proliferative diabetic retinopathy; CSME clinically significant macular edema; DME diabetic macular edema; dbh dot blot hemorrhages; CWS cotton wool spot; POAG primary open angle glaucoma; C/D cup-to-disc ratio; HVF humphrey visual field; GVF goldmann visual field; OCT optical coherence tomography; IOP intraocular pressure; BRVO Branch retinal vein occlusion; CRVO central retinal vein occlusion; CRAO central retinal artery occlusion; BRAO branch retinal artery occlusion; RT retinal tear; SB scleral buckle; PPV pars plana vitrectomy; VH Vitreous hemorrhage; PRP panretinal laser photocoagulation; IVK intravitreal kenalog; VMT vitreomacular traction; MH Macular hole;  NVD neovascularization of the disc; NVE neovascularization elsewhere; AREDS age related eye disease study; ARMD age related macular degeneration; POAG primary open angle glaucoma; EBMD epithelial/anterior basement membrane dystrophy; ACIOL anterior chamber intraocular lens; IOL intraocular lens; PCIOL posterior chamber intraocular lens; Phaco/IOL phacoemulsification with intraocular lens placement; PRK photorefractive keratectomy; LASIK laser assisted in situ keratomileusis; HTN hypertension; DM diabetes mellitus; COPD chronic obstructive pulmonary disease

## 2024-05-23 ENCOUNTER — Ambulatory Visit (INDEPENDENT_AMBULATORY_CARE_PROVIDER_SITE_OTHER): Admitting: Ophthalmology

## 2024-05-23 ENCOUNTER — Encounter (INDEPENDENT_AMBULATORY_CARE_PROVIDER_SITE_OTHER): Payer: Self-pay | Admitting: Ophthalmology

## 2024-05-23 DIAGNOSIS — E113313 Type 2 diabetes mellitus with moderate nonproliferative diabetic retinopathy with macular edema, bilateral: Secondary | ICD-10-CM

## 2024-05-23 DIAGNOSIS — H40003 Preglaucoma, unspecified, bilateral: Secondary | ICD-10-CM | POA: Diagnosis not present

## 2024-05-23 DIAGNOSIS — H35033 Hypertensive retinopathy, bilateral: Secondary | ICD-10-CM

## 2024-05-23 DIAGNOSIS — H43823 Vitreomacular adhesion, bilateral: Secondary | ICD-10-CM

## 2024-05-23 DIAGNOSIS — Z961 Presence of intraocular lens: Secondary | ICD-10-CM

## 2024-05-23 DIAGNOSIS — I10 Essential (primary) hypertension: Secondary | ICD-10-CM

## 2024-05-23 DIAGNOSIS — Z7984 Long term (current) use of oral hypoglycemic drugs: Secondary | ICD-10-CM | POA: Diagnosis not present

## 2024-05-23 DIAGNOSIS — H25812 Combined forms of age-related cataract, left eye: Secondary | ICD-10-CM

## 2024-05-23 MED ORDER — FARICIMAB-SVOA 6 MG/0.05ML IZ SOSY
6.0000 mg | PREFILLED_SYRINGE | INTRAVITREAL | Status: AC | PRN
  Administered 2024-05-23: 15:00:00 6 mg via INTRAVITREAL

## 2024-05-24 ENCOUNTER — Encounter: Payer: Self-pay | Admitting: Podiatry

## 2024-05-24 ENCOUNTER — Ambulatory Visit: Admitting: Podiatry

## 2024-05-24 DIAGNOSIS — I739 Peripheral vascular disease, unspecified: Secondary | ICD-10-CM

## 2024-05-24 DIAGNOSIS — B351 Tinea unguium: Secondary | ICD-10-CM | POA: Diagnosis not present

## 2024-05-24 DIAGNOSIS — M79675 Pain in left toe(s): Secondary | ICD-10-CM | POA: Diagnosis not present

## 2024-05-24 DIAGNOSIS — E1152 Type 2 diabetes mellitus with diabetic peripheral angiopathy with gangrene: Secondary | ICD-10-CM | POA: Diagnosis not present

## 2024-05-24 DIAGNOSIS — M79674 Pain in right toe(s): Secondary | ICD-10-CM

## 2024-05-24 NOTE — Progress Notes (Signed)
"  °  Subjective:  Patient ID: Erin Mitchell, female    DOB: August 16, 1945,   MRN: [id number]  Chief Complaint  Patient presents with   Diabetes    Checking my foot to make sure my callus is stable and cut my nails.  Saw Dr. Velma Ku - 05/17/2024; A1c - 6.8    78 y.o. female presents for concern of thickened elongated and painful nails that are difficult to trim. Requesting to have them trimmed today. Relates burning and tingling in their feet. Patient is diabetic and last A1c was  Lab Results  Component Value Date   HGBA1C 6.8 04/24/2024   .   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 Also has a history of PAD . Denies any other pedal complaints. Denies n/v/f/c.   PCP: Velma Ku MD   Past Medical History:  Diagnosis Date   Atrial fibrillation (HCC)    on Plavix    Cataract    Diabetes (HCC)    Diabetic retinopathy (HCC)    Exposure to Agent Orange    Hypertension    Hypertensive retinopathy    hypothyroidism    Hypothyroidism    Kidney disease     Objective:  Physical Exam: Vascular: DP/PT pulses 2/4 bilateral. CFT <3 seconds. Absent hair growth on digits. No edema.  Skin. No lacerations or abrasions bilateral feet. Nails 1-5 are thickened discolored and elongated with subungual debris.  Right great toe hhealed well no open areas.  Musculoskeletal: MMT 5/5 bilateral lower extremities in DF, PF, Inversion and Eversion. Deceased ROM in DF of ankle joint. Right second and third digits amputated. Severe bunion of right foot.  Neurological: Sensation intact to light touch.   Assessment:   1. Dermatophytosis of nail   2. Pain in toes of both feet   3. PAD (peripheral artery disease)   4. Type 2 diabetes mellitus with diabetic peripheral angiopathy and gangrene, without long-term current use of insulin  (HCC)            Plan:  Patient was evaluated and treated and all questions  answered. -Discussed and educated patient on diabetic foot care, especially with  regards to the vascular, neurological and musculoskeletal systems.  -Stressed the importance of good glycemic control and the detriment of not  controlling glucose levels in relation to the foot. -Discussed supportive shoes at all times and checking feet regularly.  -Mechanically debrided all nails 1-5 bilateral using sterile nail nipper and filed with dremel without incident  -Answered all patient questions -Patient to return  in 3 months for at risk foot care -Patient advised to call the office if any problems or questions arise in the meantime.    Erin Mitchell, DPM  "

## 2024-06-15 ENCOUNTER — Other Ambulatory Visit: Payer: Self-pay | Admitting: Family Medicine

## 2024-06-18 NOTE — Progress Notes (Signed)
 " [facility] - Clinic Note  06/26/2024     CHIEF COMPLAINT Patient presents for Retina Follow Up  HISTORY OF PRESENT ILLNESS: Erin Mitchell is a 79 y.o. female who presents to the clinic today for:   HPI     Retina Follow Up   Patient presents with  Diabetic Retinopathy.  In both eyes.  This started 5 weeks ago.  Duration of 5 weeks.  Since onset it is stable.  I, the attending physician,  performed the HPI with the patient and updated documentation appropriately.        Comments   5 week retina follow up NPDR and IVV pap OD pt is reporting no vision changes noticed she denies any flashes has some floaters her last reading 128 this am  last A1C 6.8      Last edited by Valdemar Rogue, MD on 06/26/2024 12:17 PM.     Pt states VA seems stable, no new issues noticed.   Referring physician: Alvia Bring, DO [address]  [address] [place],  [place] [address]  HISTORICAL INFORMATION:   Selected notes from the MEDICAL RECORD NUMBER Referred by Dr. Bring Alvia for DM exam LEE:  Ocular Hx- PMH- DM, HTN, afib, kidney disease, last a1c was 6.8 on 05.05.22    CURRENT MEDICATIONS: No current outpatient medications on file. (Ophthalmic Drugs)   No current facility-administered medications for this visit. (Ophthalmic Drugs)   Current Outpatient Medications (Other)  Medication Sig   Alcohol Swabs (DROPSAFE ALCOHOL PREP) 70 % PADS USE AS DIRECTED   Alpha Lipoic Acid 200 MG CAPS Take 200 mg by mouth daily.   Blood Glucose Monitoring Suppl (TRUE METRIX METER) w/Device KIT Use as directed to test blood glucose daily   Cholecalciferol (VITAMIN D-1000 MAX ST PO) Take 2,000 Units by mouth daily.   clopidogrel  (PLAVIX ) 75 MG tablet TAKE 1 TABLET EVERY DAY   glucose blood (TRUE METRIX BLOOD GLUCOSE TEST) test strip USE AS INSTRUCTED   loperamide  (IMODIUM  A-D) 2 MG tablet Take 2-4 mg by mouth 4 (four) times daily as needed for diarrhea or loose  stools.   losartan  (COZAAR ) 100 MG tablet TAKE 1 TABLET EVERY DAY   magnesium oxide (MAG-OX) 400 (240 Mg) MG tablet Take 200 mg by mouth daily.   Melatonin 10 MG CAPS Take 10 mg by mouth at bedtime.   metFORMIN  (GLUCOPHAGE -XR) 500 MG 24 hr tablet TAKE 2 TABLETS TWICE DAILY WITH MEALS   metoprolol  tartrate (LOPRESSOR ) 25 MG tablet TAKE 1 TABLET TWICE DAILY   NP THYROID  90 MG tablet TAKE 1 TABLET BY MOUTH EVERY DAY *NOT COVERED   rosuvastatin  (CRESTOR ) 40 MG tablet TAKE 1 TABLET EVERY DAY   TRUEplus Lancets 33G MISC Use as directed daily.   Zinc Sulfate (ZINC 15 PO) Take by mouth.   No current facility-administered medications for this visit. (Other)   REVIEW OF SYSTEMS: ROS   Positive for: Endocrine, Cardiovascular, Eyes Negative for: Constitutional, Gastrointestinal, Neurological, Skin, Genitourinary, Musculoskeletal, HENT, Respiratory, Psychiatric, Allergic/Imm, Heme/Lymph Last edited by Resa Delon ORN, COT on 06/26/2024  9:05 AM.      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   VENTRAL HERNIA REPAIR N/A 12/28/2020   Procedure: LAPAROSCOPIC VENTRAL INCISIONAL HERNIA REPAIR WITH MESH;  Surgeon: Eletha Boas, MD;  Location: [facility];  Service: General;  Laterality: N/A;   FAMILY HISTORY Family History  Problem Relation Age of Onset   Hypertension Father    Diabetes Brother    Emphysema Mother    SOCIAL HISTORY Social History   Tobacco Use    Smoking status: Former    Current packs/day: 0.00    Average packs/day: 0.3 packs/day for 1 year (0.3 ttl pk-yrs)    Types: Cigarettes    Start date: 06/06/1969    Quit date: 06/06/1970    Years since quitting: 54.0   Smokeless tobacco: Never  Vaping Use   Vaping status: Never Used  Substance Use Topics   Alcohol use: Never   Drug use: Never       OPHTHALMIC EXAM: Base Eye Exam     Visual Acuity (Snellen - Linear)       Right Left   Dist cc 20/50 20/20 -1   Dist ph cc 20/40     Correction: Glasses         Tonometry (Tonopen, 9:10 AM)       Right Left   Pressure 6 10         Pupils       Pupils Dark Light Shape React APD   Right PERRL 2 1 Round Brisk None   Left PERRL 2 1 Round Brisk None         Visual Fields       Left Right    Full Full         Extraocular Movement       Right Left    Full, Ortho Full, Ortho         Neuro/Psych     Oriented x3: Yes   Mood/Affect: Normal         Dilation     Both eyes: 2.5% Phenylephrine  @ 9:10 AM           Slit Lamp and Fundus Exam     Slit Lamp Exam       Right Left   Lids/Lashes Dermatochalasis - upper lid, Meibomian gland dysfunction Dermatochalasis - upper lid, Meibomian gland dysfunction   Conjunctiva/Sclera White and quiet White and quiet   Cornea arcus, trace tear film debris, well healed cataract wound, 2+ fine Punctate epithelial erosions arcus, trace PEE, mild tear film debris   Anterior Chamber Moderate depth, clear, very narrow temporal angles Moderate depth, clear, very narrow temporal angles   Iris Round and moderately dilated, mild anterior bowing Round and moderately dilated, mild anterior bowing   Lens PC IOL in good position, fine PCO 2-3+ Nuclear sclerosis with brunescence, 2-3+ Cortical cataract   Anterior Vitreous Vitreous syneresis, Posterior vitreous detachment, +vitreous condensations Vitreous syneresis, Posterior vitreous detachment, vitreous condensations          Fundus Exam       Right Left   Disc Trace pallor, Sharp rim, +cupping Pink and Sharp, +cupping   C/D Ratio 0.7 0.7  Macula Blunted foveal reflex, central cystic changes / edema, RPE mottling, fine drusen, pigment clumping, cluster of MA temporal to fovea and temporal macula -- stably improved -- no heme, mild ERM, punctate central exudation Flat, Blunted foveal reflex, RPE mottling, no heme, no exudates   Vessels attenuated, Tortuous attenuated, Tortuous   Periphery Attached, mild reticular degeneration, rare MA, focal DBH outside IT arcades Attached, mild reticular degeneration, rare MA           Refraction     Wearing Rx       Sphere Cylinder Axis Add   Right -0.50 +0.75 166 +2.75   Left +1.75 Sphere  +2.75    Type: Bifocal           IMAGING AND PROCEDURES  Imaging and Procedures for 06/26/2024  OCT, Retina - OU - Both Eyes       Right Eye Quality was borderline. Central Foveal Thickness: 276. Progression has improved. Findings include no SRF, abnormal foveal contour, intraretinal hyper-reflective material, epiretinal membrane, intraretinal fluid, vitreomacular adhesion (Persistent central cyst and surrounding cystic changes--slightly improved).   Left Eye Quality was good. Central Foveal Thickness: 269. Progression has been stable. Findings include no SRF, abnormal foveal contour, intraretinal fluid, vitreous traction, vitreomacular adhesion (persistent central VMT with trace cystic changes).   Notes *Images captured and stored on drive  Diagnosis / Impression:  OD: +central DME, Persistent central cyst and surrounding cystic changes --slightly improved OS: persistent central VMT with trace cystic changes  Clinical management:  See below  Abbreviations: NFP - Normal foveal profile. CME - cystoid macular edema. PED - pigment epithelial detachment. IRF - intraretinal fluid. SRF - subretinal fluid. EZ - ellipsoid zone. ERM - epiretinal membrane. ORA - outer retinal  atrophy. ORT - outer retinal tubulation. SRHM - subretinal hyper-reflective material. IRHM - intraretinal hyper-reflective material      Intravitreal Injection, Pharmacologic Agent - OD - Right Eye       Time Out 06/26/2024. 9:28 AM. Confirmed correct patient, procedure, site, and patient consented.   Anesthesia Topical anesthesia was used. Anesthetic medications included Lidocaine  2%, Proparacaine 0.5%.   Procedure Preparation included 5% betadine to ocular surface, eyelid speculum. A supplied (32g) needle was used.   Injection: 6 mg faricimab -svoa 6 MG/0.05ML (Patient supplied)   Route: Intravitreal, Site: Right Eye   NDC: 49757-903-93, Lot: [id number], Expiration date: 03/05/2025, Waste: 0 mL   Post-op Post injection exam found visual acuity of at least counting fingers. The patient tolerated the procedure well. There were no complications. The patient received written and verbal post procedure care education. Post injection medications were not given.   Notes **PAP MEDICATION ADMINISTERED**            ASSESSMENT/PLAN:    ICD-10-CM   1. Moderate nonproliferative diabetic retinopathy of both eyes with macular edema associated with type 2 diabetes mellitus (HCC)  E11.3313 OCT, Retina - OU - Both Eyes    Intravitreal Injection, Pharmacologic Agent - OD - Right Eye    faricimab -svoa (VABYSMO ) 6mg /0.46mL intravitreal injection    2. Long term (current) use of oral hypoglycemic drugs  Z79.84     3. 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 Essential hypertension  I10     5. Hypertensive retinopathy of both eyes  H35.033     6. Pseudophakia  Z96.1     7. Combined forms of age-related cataract of left eye  H25.812     8. Glaucoma suspect of both  eyes  H40.003      1,2. Moderate non-proliferative diabetic retinopathy, both eyes - previously seen at [facility] by Dr. Phyllistine (Fax [phone]) - history of anti-VEGF injections since 2019 -- last  injection IVE OD Feb/Mar 2022 -- q64mo interval  - A1c: 6.8 (11.19.25), 6.4 (05.30.25), 6.3 (12.16.24) - BCVA OD 20/40 from 20/30; OS 20/20 -- stable OU - s/p IVE OD #1 (06.22.22 sample), #2 (09.07.22), #3 (11.14.22), #4 (12.28.22), #5 (02.08.23), IVE #6 (03.15.23), #7 (04.19.23), #8 (05.24.23), #9 (06.28.23), #10 (07.27.23), #11 (08.24.23)  -- IVE resistance =================== - s/p IVV OD #1 (09.21.23), #2 (10.19.23), #3 (11.16.23), #4 (12.14.23), #5 (01.18.24), #6 (02.21.24), #7 (03.27.24), #8 (05.01.24), #9 (06.05.24), #10 (07.09.24), #11 (08.14.24), #12 (09.18.24), #13 (10.23.24), #14 (11.27.24), #15 (sample 01.02.25),  #16 (sample 02.05.25), #17 (sample 03.19.25), #18 (sample 04.23.25),  #19 (sample 05.28.25), #20 (sample 07.02.25), #21 (sample 08.06.25), #22 (sample 09.10.25), #23 (PAP 10.15.25), #24 (PAP 11.20.25), #25 (PAP 12.18.25) - FA (11.14.22) shows OD: Focal hyperflourescence IT to fovea; no NV; OS: No NV - OCT shows OD: +central DME, Persistent central cyst and surrounding cystic changes--slightly improved; OS: persistent central VMT with trace cystic changes at 5 wks - BCVA OD 20/40 from 20/30 - recommend IVV OD #26 (PAP med) today, 01.21.26-- w/ f/u in 5 wks  - pt wishes to proceed - RBA of procedure discussed, questions answered - IVV informed consent obtained and signed, 02.05.25 - see procedure note - Vabysmo  PAP approved, insurance will pay 100% - pt approved for Good Days in 2026 - f/u in 5 wks -- DFE/OCT, possible injection  3. VMA/VMT OU  - central VMA/VMT OD likely contributing to central cyst  - OS stable  - monitor  4,5. Hypertensive retinopathy OU - discussed importance of tight BP control - monitor  6. Pseudophakia OD  - s/p CE/IOL OD (02.29.24, Dr. Austin)  - IOL in good position, doing well  - monitor  7. Mixed Cataract OS - The symptoms of cataract, surgical options, and treatments and risks were discussed with patient. - discussed diagnosis and  progression - under the expert management of Dr. Austin  8. Glaucoma suspect  - C/D 0.7 OU  - IOPs okay (06,10)  - under the expert management of Dr. Austin  Ophthalmic Meds Ordered this visit:  Meds ordered this encounter  Medications   faricimab -svoa (VABYSMO ) 6mg /0.13mL intravitreal injection     Return in about 5 weeks (around 07/31/2024) for f/u, NPDR, DFE, OCT, Possible, IVV, PAP medication, OD.  There are no Patient Instructions on file for this visit.   This document serves as a record of services personally performed by Redell JUDITHANN Hans, MD, PhD. It was created on their behalf by Almetta Pesa, an ophthalmic technician. The creation of this record is the provider's dictation and/or activities during the visit.    Electronically signed by: Almetta Pesa, OA, 06/26/24  12:20 PM  This document serves as a record of services personally performed by Redell JUDITHANN Hans, MD, PhD. It was created on their behalf by Wanda GEANNIE Keens, COT an ophthalmic technician. The creation of this record is the provider's dictation and/or activities during the visit.    Electronically signed by:  Wanda GEANNIE Keens, COT  06/26/24 12:20 PM  Redell JUDITHANN Hans, M.D., Ph.D. Diseases & Surgery of the Retina and Vitreous [facility]  I have reviewed the above documentation for accuracy and completeness, and I agree with the above. Keanon Bevins G. Artemus Romanoff,  M.D., Ph.D. 06/26/24 12:20 PM    Abbreviations: M myopia (nearsighted); A astigmatism; H hyperopia (farsighted); P presbyopia; Mrx spectacle prescription;  CTL contact lenses; OD right eye; OS left eye; OU both eyes  XT exotropia; ET esotropia; PEK punctate epithelial keratitis; PEE punctate epithelial erosions; DES dry eye syndrome; MGD meibomian gland dysfunction; ATs artificial tears; PFAT's preservative free artificial tears; NSC nuclear sclerotic cataract; PSC posterior subcapsular cataract; ERM epi-retinal membrane; PVD posterior  vitreous detachment; RD retinal detachment; DM diabetes mellitus; DR diabetic retinopathy; NPDR non-proliferative diabetic retinopathy; PDR proliferative diabetic retinopathy; CSME clinically significant macular edema; DME diabetic macular edema; dbh dot blot hemorrhages; CWS cotton wool spot; POAG primary open angle glaucoma; C/D cup-to-disc ratio; HVF humphrey visual field; GVF goldmann visual field; OCT optical coherence tomography; IOP intraocular pressure; BRVO Branch retinal vein occlusion; CRVO central retinal vein occlusion; CRAO central retinal artery occlusion; BRAO branch retinal artery occlusion; RT retinal tear; SB scleral buckle; PPV pars plana vitrectomy; VH Vitreous hemorrhage; PRP panretinal laser photocoagulation; IVK intravitreal kenalog; VMT vitreomacular traction; MH Macular hole;  NVD neovascularization of the disc; NVE neovascularization elsewhere; AREDS age related eye disease study; ARMD age related macular degeneration; POAG primary open angle glaucoma; EBMD epithelial/anterior basement membrane dystrophy; ACIOL anterior chamber intraocular lens; IOL intraocular lens; PCIOL posterior chamber intraocular lens; Phaco/IOL phacoemulsification with intraocular lens placement; PRK photorefractive keratectomy; LASIK laser assisted in situ keratomileusis; HTN hypertension; DM diabetes mellitus; COPD chronic obstructive pulmonary disease "

## 2024-06-26 ENCOUNTER — Encounter (INDEPENDENT_AMBULATORY_CARE_PROVIDER_SITE_OTHER): Payer: Self-pay | Admitting: Ophthalmology

## 2024-06-26 ENCOUNTER — Ambulatory Visit (INDEPENDENT_AMBULATORY_CARE_PROVIDER_SITE_OTHER): Admitting: Ophthalmology

## 2024-06-26 DIAGNOSIS — I10 Essential (primary) hypertension: Secondary | ICD-10-CM | POA: Diagnosis not present

## 2024-06-26 DIAGNOSIS — H43823 Vitreomacular adhesion, bilateral: Secondary | ICD-10-CM

## 2024-06-26 DIAGNOSIS — Z961 Presence of intraocular lens: Secondary | ICD-10-CM

## 2024-06-26 DIAGNOSIS — H35033 Hypertensive retinopathy, bilateral: Secondary | ICD-10-CM | POA: Diagnosis not present

## 2024-06-26 DIAGNOSIS — E113313 Type 2 diabetes mellitus with moderate nonproliferative diabetic retinopathy with macular edema, bilateral: Secondary | ICD-10-CM | POA: Diagnosis not present

## 2024-06-26 DIAGNOSIS — H25812 Combined forms of age-related cataract, left eye: Secondary | ICD-10-CM | POA: Diagnosis not present

## 2024-06-26 DIAGNOSIS — Z7984 Long term (current) use of oral hypoglycemic drugs: Secondary | ICD-10-CM | POA: Diagnosis not present

## 2024-06-26 DIAGNOSIS — H40003 Preglaucoma, unspecified, bilateral: Secondary | ICD-10-CM | POA: Diagnosis not present

## 2024-06-26 MED ORDER — FARICIMAB-SVOA 6 MG/0.05ML IZ SOSY
6.0000 mg | PREFILLED_SYRINGE | INTRAVITREAL | Status: AC | PRN
  Administered 2024-06-26: 6 mg via INTRAVITREAL

## 2024-07-31 ENCOUNTER — Encounter (INDEPENDENT_AMBULATORY_CARE_PROVIDER_SITE_OTHER): Admitting: Ophthalmology

## 2024-08-13 ENCOUNTER — Encounter (HOSPITAL_COMMUNITY)

## 2024-08-13 ENCOUNTER — Ambulatory Visit

## 2024-08-23 ENCOUNTER — Ambulatory Visit: Admitting: Podiatry

## 2024-10-23 ENCOUNTER — Ambulatory Visit: Admitting: Family Medicine
